# Patient Record
Sex: Female | Born: 1950
Health system: Southern US, Community
[De-identification: ages and names within clinical notes are randomized; demographics above are authoritative.]

## PROBLEM LIST (undated history)

## (undated) ENCOUNTER — Emergency Department (HOSPITAL_COMMUNITY): Discharge status: Absent without leave | Payer: No Typology Code available for payment source

## (undated) DIAGNOSIS — E119 Type 2 diabetes mellitus without complications: Secondary | ICD-10-CM

## (undated) DIAGNOSIS — I10 Essential (primary) hypertension: Secondary | ICD-10-CM

## (undated) DIAGNOSIS — N289 Disorder of kidney and ureter, unspecified: Secondary | ICD-10-CM

## (undated) HISTORY — PX: AV FISTULA PLACEMENT: SHX1204

## (undated) HISTORY — PX: LEG SURGERY: SHX1003

## (undated) HISTORY — PX: HIP FRACTURE SURGERY: SHX118

---

## 2018-01-14 ENCOUNTER — Encounter (HOSPITAL_COMMUNITY): Payer: Self-pay | Admitting: Emergency Medicine

## 2018-01-14 ENCOUNTER — Emergency Department (HOSPITAL_COMMUNITY)
Admission: EM | Admit: 2018-01-14 | Discharge: 2018-01-15 | Disposition: A | Discharge status: Home / Self Care | Payer: No Typology Code available for payment source | Attending: Emergency Medicine | Admitting: Emergency Medicine

## 2018-01-14 ENCOUNTER — Other Ambulatory Visit: Payer: Self-pay

## 2018-01-14 DIAGNOSIS — R519 Headache, unspecified: Secondary | ICD-10-CM

## 2018-01-14 DIAGNOSIS — M546 Pain in thoracic spine: Secondary | ICD-10-CM | POA: Diagnosis not present

## 2018-01-14 DIAGNOSIS — M542 Cervicalgia: Secondary | ICD-10-CM | POA: Diagnosis not present

## 2018-01-14 DIAGNOSIS — I10 Essential (primary) hypertension: Secondary | ICD-10-CM | POA: Insufficient documentation

## 2018-01-14 DIAGNOSIS — Y92410 Unspecified street and highway as the place of occurrence of the external cause: Secondary | ICD-10-CM | POA: Insufficient documentation

## 2018-01-14 DIAGNOSIS — Y999 Unspecified external cause status: Secondary | ICD-10-CM | POA: Diagnosis not present

## 2018-01-14 DIAGNOSIS — E119 Type 2 diabetes mellitus without complications: Secondary | ICD-10-CM | POA: Insufficient documentation

## 2018-01-14 DIAGNOSIS — Y939 Activity, unspecified: Secondary | ICD-10-CM | POA: Diagnosis not present

## 2018-01-14 DIAGNOSIS — S199XXA Unspecified injury of neck, initial encounter: Secondary | ICD-10-CM | POA: Diagnosis present

## 2018-01-14 DIAGNOSIS — R51 Headache: Secondary | ICD-10-CM | POA: Diagnosis not present

## 2018-01-14 HISTORY — DX: Type 2 diabetes mellitus without complications: E11.9

## 2018-01-14 HISTORY — DX: Essential (primary) hypertension: I10

## 2018-01-14 NOTE — ED Notes (Signed)
ED Provider at bedside. 
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
## 2018-01-15 ENCOUNTER — Emergency Department (HOSPITAL_COMMUNITY): Payer: No Typology Code available for payment source

## 2018-01-15 LAB — CBG MONITORING, ED: GLUCOSE-CAPILLARY: 94 mg/dL (ref 70–99)

## 2018-01-15 NOTE — ED Notes (Signed)
Patient transported to CT 
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
## 2018-01-15 NOTE — Discharge Instructions (Addendum)
Use ice and heat on the painful areas for comfort. You can take ibuprofen children's liquid 350 mg (17.5 cc of the 100 mg per 5 cc) and/or 500 mg of acetaminophen (16 cc of the 160 mg per 5 cc) every 6 hours as needed for pain.  Return to the emergency department for any problems listed on the head injury sheet.   Use hielo y calor en las reas dolorosas para mayor comodidad. Puede tomar ibuprofeno lquido para nios 350 mg (17.5 cc de los 100 mg por 5 cc) y / o 500 mg de acetaminofeno (16 cc de los 160 mg por 5 cc) cada 6 horas segn sea necesario para el dolor. Regrese al departamento de emergencias por cualquier problema mencionado en la hoja de lesiones en la cabeza.

## 2018-01-15 NOTE — ED Notes (Signed)
Patient transported to X-ray 

## 2018-01-15 NOTE — ED Notes (Signed)
ED Provider at bedside. 

## 2018-01-15 NOTE — ED Notes (Signed)
Patient verbalizes understanding of discharge instructions. Opportunity for questioning and answers were provided. Armband removed by staff, pt discharged from ED.  

## 2019-03-21 ENCOUNTER — Inpatient Hospital Stay (HOSPITAL_COMMUNITY)
Admission: EM | Admit: 2019-03-21 | Discharge: 2019-03-25 | DRG: 682 | Disposition: A | Discharge status: Home / Self Care | Payer: Self-pay | Attending: Internal Medicine | Admitting: Internal Medicine

## 2019-03-21 ENCOUNTER — Other Ambulatory Visit (HOSPITAL_COMMUNITY): Payer: Self-pay

## 2019-03-21 ENCOUNTER — Emergency Department (HOSPITAL_COMMUNITY): Payer: Self-pay

## 2019-03-21 ENCOUNTER — Encounter (HOSPITAL_COMMUNITY): Payer: Self-pay

## 2019-03-21 ENCOUNTER — Inpatient Hospital Stay (HOSPITAL_COMMUNITY): Payer: Self-pay

## 2019-03-21 ENCOUNTER — Other Ambulatory Visit: Payer: Self-pay

## 2019-03-21 DIAGNOSIS — I10 Essential (primary) hypertension: Secondary | ICD-10-CM

## 2019-03-21 DIAGNOSIS — R011 Cardiac murmur, unspecified: Secondary | ICD-10-CM

## 2019-03-21 DIAGNOSIS — Z794 Long term (current) use of insulin: Secondary | ICD-10-CM

## 2019-03-21 DIAGNOSIS — J9811 Atelectasis: Secondary | ICD-10-CM

## 2019-03-21 DIAGNOSIS — Z9889 Other specified postprocedural states: Secondary | ICD-10-CM

## 2019-03-21 DIAGNOSIS — E871 Hypo-osmolality and hyponatremia: Secondary | ICD-10-CM | POA: Diagnosis present

## 2019-03-21 DIAGNOSIS — I129 Hypertensive chronic kidney disease with stage 1 through stage 4 chronic kidney disease, or unspecified chronic kidney disease: Secondary | ICD-10-CM

## 2019-03-21 DIAGNOSIS — R809 Proteinuria, unspecified: Secondary | ICD-10-CM

## 2019-03-21 DIAGNOSIS — E1129 Type 2 diabetes mellitus with other diabetic kidney complication: Secondary | ICD-10-CM

## 2019-03-21 DIAGNOSIS — R079 Chest pain, unspecified: Secondary | ICD-10-CM | POA: Diagnosis present

## 2019-03-21 DIAGNOSIS — I5033 Acute on chronic diastolic (congestive) heart failure: Secondary | ICD-10-CM | POA: Diagnosis present

## 2019-03-21 DIAGNOSIS — R0602 Shortness of breath: Secondary | ICD-10-CM

## 2019-03-21 DIAGNOSIS — D649 Anemia, unspecified: Secondary | ICD-10-CM

## 2019-03-21 DIAGNOSIS — R0781 Pleurodynia: Secondary | ICD-10-CM

## 2019-03-21 DIAGNOSIS — N281 Cyst of kidney, acquired: Secondary | ICD-10-CM | POA: Diagnosis present

## 2019-03-21 DIAGNOSIS — N2581 Secondary hyperparathyroidism of renal origin: Secondary | ICD-10-CM | POA: Diagnosis present

## 2019-03-21 DIAGNOSIS — M7989 Other specified soft tissue disorders: Secondary | ICD-10-CM

## 2019-03-21 DIAGNOSIS — Z634 Disappearance and death of family member: Secondary | ICD-10-CM

## 2019-03-21 DIAGNOSIS — E876 Hypokalemia: Secondary | ICD-10-CM | POA: Diagnosis present

## 2019-03-21 DIAGNOSIS — D631 Anemia in chronic kidney disease: Secondary | ICD-10-CM | POA: Diagnosis present

## 2019-03-21 DIAGNOSIS — N185 Chronic kidney disease, stage 5: Secondary | ICD-10-CM | POA: Diagnosis present

## 2019-03-21 DIAGNOSIS — N179 Acute kidney failure, unspecified: Principal | ICD-10-CM

## 2019-03-21 DIAGNOSIS — N189 Chronic kidney disease, unspecified: Secondary | ICD-10-CM

## 2019-03-21 DIAGNOSIS — E872 Acidosis: Secondary | ICD-10-CM | POA: Diagnosis present

## 2019-03-21 DIAGNOSIS — J189 Pneumonia, unspecified organism: Secondary | ICD-10-CM

## 2019-03-21 DIAGNOSIS — E875 Hyperkalemia: Secondary | ICD-10-CM

## 2019-03-21 DIAGNOSIS — R633 Feeding difficulties: Secondary | ICD-10-CM

## 2019-03-21 DIAGNOSIS — Z79899 Other long term (current) drug therapy: Secondary | ICD-10-CM

## 2019-03-21 DIAGNOSIS — E1122 Type 2 diabetes mellitus with diabetic chronic kidney disease: Secondary | ICD-10-CM

## 2019-03-21 DIAGNOSIS — Z20828 Contact with and (suspected) exposure to other viral communicable diseases: Secondary | ICD-10-CM | POA: Diagnosis present

## 2019-03-21 DIAGNOSIS — N186 End stage renal disease: Secondary | ICD-10-CM | POA: Diagnosis present

## 2019-03-21 LAB — CBC WITH DIFFERENTIAL/PLATELET
Abs Immature Granulocytes: 0.03 10*3/uL (ref 0.00–0.07)
Basophils Absolute: 0 10*3/uL (ref 0.0–0.1)
Basophils Relative: 0 %
Eosinophils Absolute: 0.1 10*3/uL (ref 0.0–0.5)
Eosinophils Relative: 2 %
HCT: 18.5 % — ABNORMAL LOW (ref 36.0–46.0)
Hemoglobin: 6 g/dL — CL (ref 12.0–15.0)
Immature Granulocytes: 1 %
Lymphocytes Relative: 9 %
Lymphs Abs: 0.6 10*3/uL — ABNORMAL LOW (ref 0.7–4.0)
MCH: 28.2 pg (ref 26.0–34.0)
MCHC: 32.4 g/dL (ref 30.0–36.0)
MCV: 86.9 fL (ref 80.0–100.0)
Monocytes Absolute: 0.4 10*3/uL (ref 0.1–1.0)
Monocytes Relative: 6 %
Neutro Abs: 4.8 10*3/uL (ref 1.7–7.7)
Neutrophils Relative %: 82 %
Platelets: 231 10*3/uL (ref 150–400)
RBC: 2.13 MIL/uL — ABNORMAL LOW (ref 3.87–5.11)
RDW: 13.5 % (ref 11.5–15.5)
WBC: 5.8 10*3/uL (ref 4.0–10.5)
nRBC: 0 % (ref 0.0–0.2)

## 2019-03-21 LAB — I-STAT CHEM 8, ED
BUN: 121 mg/dL — ABNORMAL HIGH (ref 8–23)
Calcium, Ion: 0.84 mmol/L — CL (ref 1.15–1.40)
Chloride: 102 mmol/L (ref 98–111)
Creatinine, Ser: 9.3 mg/dL — ABNORMAL HIGH (ref 0.44–1.00)
Glucose, Bld: 87 mg/dL (ref 70–99)
HCT: 16 % — ABNORMAL LOW (ref 36.0–46.0)
Hemoglobin: 5.4 g/dL — CL (ref 12.0–15.0)
Potassium: 5.4 mmol/L — ABNORMAL HIGH (ref 3.5–5.1)
Sodium: 129 mmol/L — ABNORMAL LOW (ref 135–145)
TCO2: 19 mmol/L — ABNORMAL LOW (ref 22–32)

## 2019-03-21 LAB — URINALYSIS, ROUTINE W REFLEX MICROSCOPIC
Bilirubin Urine: NEGATIVE
Glucose, UA: 150 mg/dL — AB
Hgb urine dipstick: NEGATIVE
Ketones, ur: NEGATIVE mg/dL
Leukocytes,Ua: NEGATIVE
Nitrite: NEGATIVE
Protein, ur: >=300 mg/dL — AB
Specific Gravity, Urine: 1.012 (ref 1.005–1.030)
pH: 7 (ref 5.0–8.0)

## 2019-03-21 LAB — COMPREHENSIVE METABOLIC PANEL
ALT: 13 U/L (ref 0–44)
AST: 16 U/L (ref 15–41)
Albumin: 2.4 g/dL — ABNORMAL LOW (ref 3.5–5.0)
Alkaline Phosphatase: 127 U/L — ABNORMAL HIGH (ref 38–126)
Anion gap: 15 (ref 5–15)
BUN: 109 mg/dL — ABNORMAL HIGH (ref 8–23)
CO2: 15 mmol/L — ABNORMAL LOW (ref 22–32)
Calcium: 6.5 mg/dL — ABNORMAL LOW (ref 8.9–10.3)
Chloride: 100 mmol/L (ref 98–111)
Creatinine, Ser: 8.49 mg/dL — ABNORMAL HIGH (ref 0.44–1.00)
GFR calc Af Amer: 5 mL/min — ABNORMAL LOW (ref >60)
GFR calc non Af Amer: 4 mL/min — ABNORMAL LOW (ref >60)
Glucose, Bld: 133 mg/dL — ABNORMAL HIGH (ref 70–99)
Potassium: 5.3 mmol/L — ABNORMAL HIGH (ref 3.5–5.1)
Sodium: 130 mmol/L — ABNORMAL LOW (ref 135–145)
Total Bilirubin: 0.4 mg/dL (ref 0.3–1.2)
Total Protein: 5.5 g/dL — ABNORMAL LOW (ref 6.5–8.1)

## 2019-03-21 LAB — PROTIME-INR
INR: 1.1 (ref 0.8–1.2)
Prothrombin Time: 13.9 seconds (ref 11.4–15.2)

## 2019-03-21 LAB — CBG MONITORING, ED
Glucose-Capillary: 54 mg/dL — ABNORMAL LOW (ref 70–99)
Glucose-Capillary: 61 mg/dL — ABNORMAL LOW (ref 70–99)

## 2019-03-21 LAB — IRON AND TIBC
Iron: 33 ug/dL (ref 28–170)
Saturation Ratios: 16 % (ref 10.4–31.8)
TIBC: 203 ug/dL — ABNORMAL LOW (ref 250–450)
UIBC: 170 ug/dL

## 2019-03-21 LAB — MAGNESIUM: Magnesium: 2.2 mg/dL (ref 1.7–2.4)

## 2019-03-21 LAB — TROPONIN I (HIGH SENSITIVITY)
Troponin I (High Sensitivity): 11 ng/L (ref <18)
Troponin I (High Sensitivity): 14 ng/L (ref <18)

## 2019-03-21 LAB — RETICULOCYTES
Immature Retic Fract: 9.9 % (ref 2.3–15.9)
RBC.: 2.43 MIL/uL — ABNORMAL LOW (ref 3.87–5.11)
Retic Count, Absolute: 24.1 10*3/uL (ref 19.0–186.0)
Retic Ct Pct: 1 % (ref 0.4–3.1)

## 2019-03-21 LAB — FERRITIN: Ferritin: 106 ng/mL (ref 11–307)

## 2019-03-21 LAB — LIPASE, BLOOD: Lipase: 65 U/L — ABNORMAL HIGH (ref 11–51)

## 2019-03-21 LAB — POC OCCULT BLOOD, ED: Fecal Occult Bld: NEGATIVE

## 2019-03-21 LAB — BRAIN NATRIURETIC PEPTIDE: B Natriuretic Peptide: 453.3 pg/mL — ABNORMAL HIGH (ref 0.0–100.0)

## 2019-03-21 LAB — TSH: TSH: 6.621 u[IU]/mL — ABNORMAL HIGH (ref 0.350–4.500)

## 2019-03-21 LAB — PHOSPHORUS: Phosphorus: 7.4 mg/dL — ABNORMAL HIGH (ref 2.5–4.6)

## 2019-03-21 LAB — PREPARE RBC (CROSSMATCH)

## 2019-03-21 LAB — ABO/RH: ABO/RH(D): O POS

## 2019-03-21 MED ORDER — ACETAMINOPHEN 650 MG RE SUPP: 650.0000 mg | Freq: Four times a day (QID) | RECTAL | Status: DC | PRN

## 2019-03-21 MED ORDER — SODIUM ZIRCONIUM CYCLOSILICATE 5 G PO PACK
5.0000 g | PACK | Freq: Every day | ORAL | Status: AC
  Administered 2019-03-21 – 2019-03-22 (×2): 5 g via ORAL
  Filled 2019-03-21 (×2): qty 1

## 2019-03-21 MED ORDER — CALCIUM CARBONATE 1250 (500 CA) MG PO TABS
1.0000 | ORAL_TABLET | Freq: Three times a day (TID) | ORAL | Status: DC
  Administered 2019-03-21 – 2019-03-23 (×6): 500 mg via ORAL
  Filled 2019-03-21 (×9): qty 1

## 2019-03-21 MED ORDER — ASPIRIN 81 MG PO CHEW
324.0000 mg | CHEWABLE_TABLET | Freq: Once | ORAL | Status: DC
  Filled 2019-03-21: qty 4

## 2019-03-21 MED ORDER — SODIUM CHLORIDE 0.9% FLUSH
3.0000 mL | Freq: Two times a day (BID) | INTRAVENOUS | Status: DC
  Administered 2019-03-22 – 2019-03-24 (×6): 3 mL via INTRAVENOUS
  Administered 2019-03-24: 10 mL via INTRAVENOUS

## 2019-03-21 MED ORDER — ACETAMINOPHEN 325 MG PO TABS
650.0000 mg | ORAL_TABLET | Freq: Four times a day (QID) | ORAL | Status: DC | PRN
  Administered 2019-03-24: 650 mg via ORAL
  Filled 2019-03-21: qty 2

## 2019-03-21 MED ORDER — AMLODIPINE BESYLATE 5 MG PO TABS
5.0000 mg | ORAL_TABLET | Freq: Every day | ORAL | Status: DC
  Administered 2019-03-21 – 2019-03-25 (×5): 5 mg via ORAL
  Filled 2019-03-21 (×5): qty 1

## 2019-03-21 MED ORDER — INSULIN ASPART 100 UNIT/ML ~~LOC~~ SOLN
0.0000 [IU] | Freq: Three times a day (TID) | SUBCUTANEOUS | Status: DC
  Administered 2019-03-23 (×2): 1 [IU] via SUBCUTANEOUS
  Administered 2019-03-24 (×2): 2 [IU] via SUBCUTANEOUS

## 2019-03-21 MED ORDER — SODIUM CHLORIDE 0.9% IV SOLUTION: Freq: Once | INTRAVENOUS | Status: DC

## 2019-03-21 MED ORDER — NITROGLYCERIN 2 % TD OINT
1.0000 [in_us] | TOPICAL_OINTMENT | Freq: Four times a day (QID) | TRANSDERMAL | Status: DC
  Administered 2019-03-21: 1 [in_us] via TOPICAL
  Filled 2019-03-21: qty 1

## 2019-03-21 MED ORDER — CAPTOPRIL 25 MG PO TABS
50.0000 mg | ORAL_TABLET | Freq: Once | ORAL | Status: AC
  Administered 2019-03-21: 50 mg via ORAL
  Filled 2019-03-21 (×2): qty 1

## 2019-03-21 NOTE — Progress Notes (Signed)
Bilateral lower extremity venous duplex completed. Refer to "CV Proc" under chart review to view preliminary results.  03/21/2019 5:53 PM Kelby Aline., MHA, RVT, RDCS, RDMS

## 2019-03-21 NOTE — ED Provider Notes (Addendum)
[facility] EMERGENCY DEPARTMENT Provider Note   CSN: [id number] Arrival date & time: 03/21/19  1551     History   Chief Complaint Chief Complaint  Patient presents with   Chest Pain    HPI Amber Dixon is a 68 y.o. female.     HPI Patient reports a history of diabetes and hypertension.  She has been in the [place] for approximately 1 month.  She reports she has not had her medications since coming to the [place].  She denies any prior history of heart attack or known heart problems.  She started developing chest pain 2 nights ago.  Is been on the left side.  There is been some pressure and sharp quality.  It is made somewhat worse by deep breath and position changes.  She denies any cough or fever.  No known Covid exposure.  Patient reports sometimes she has felt a bit nauseated and has had some decreased appetite.  She has experienced some shortness of breath with exertion.  Patient was taking her blood pressure medication in [place] but does not know the name of it.  She was recently informed that her mother in [place] died and she also thinks that might be contributing to her elevated blood pressure. Past Medical History:  Diagnosis Date   Diabetes mellitus without complication ([place])    Hypertension     There are no active problems to display for this patient.   Past Surgical History:  Procedure Laterality Date   LEG SURGERY       OB History   No obstetric history on file.      Home Medications    Prior to Admission medications   Medication Sig Start Date End Date Taking? Authorizing Provider  captopril (CAPOTEN) 25 MG tablet Take 25 mg by mouth daily.    Yes [provider]  PRESCRIPTION MEDICATION Inject 6 Units into the skin daily. Medication:Insulin Vial   Yes [provider]    Family History History reviewed. No pertinent family history.  Social History Social History   Tobacco Use   Smoking  status: Never Smoker   Smokeless tobacco: Never Used  Substance Use Topics   Alcohol use: Not on file   Drug use: Not on file     Allergies   Patient has no known allergies.   Review of Systems Review of Systems 10 Systems reviewed and are negative for acute change except as noted in the HPI.   Physical Exam Updated Vital Signs BP (!) 155/92    Pulse 87    Temp 98.5 F (36.9 C) (Oral)    Resp (!) 23    SpO2 100%   Physical Exam Constitutional:      Comments: Patient is alert and nontoxic.  No respiratory distress.  Mental status clear.  Eyes:     Extraocular Movements: Extraocular movements intact.  Cardiovascular:     Rate and Rhythm: Normal rate and regular rhythm.  Pulmonary:     Effort: Pulmonary effort is normal.     Breath sounds: Normal breath sounds.  Chest:     Chest wall: Tenderness present.  Abdominal:     General: There is no distension.     Palpations: Abdomen is soft.     Tenderness: There is no abdominal tenderness. There is no guarding.  Genitourinary:    Comments: Stool brown and semiformed.  No melena. Musculoskeletal:     Comments: Patient is 1+ pitting edema at bilateral ankles.  Skin:    General: Skin is warm and dry.  Neurological:     General: No focal deficit present.     Mental Status: She is oriented to person, place, and time.     Coordination: Coordination normal.  Psychiatric:        Mood and Affect: Mood normal.      ED Treatments / Results  Labs (all labs ordered are listed, but only abnormal results are displayed) Labs Reviewed  COMPREHENSIVE METABOLIC PANEL - Abnormal; Notable for the following components:      Result Value   Sodium 130 (*)    Potassium 5.3 (*)    CO2 15 (*)    Glucose, Bld 133 (*)    BUN 109 (*)    Creatinine, Ser 8.49 (*)    Calcium 6.5 (*)    Total Protein 5.5 (*)    Albumin 2.4 (*)    Alkaline Phosphatase 127 (*)    GFR calc non Af Amer 4 (*)    GFR calc Af Amer 5 (*)    All other  components within normal limits  LIPASE, BLOOD - Abnormal; Notable for the following components:   Lipase 65 (*)    All other components within normal limits  BRAIN NATRIURETIC PEPTIDE - Abnormal; Notable for the following components:   B Natriuretic Peptide 453.3 (*)    All other components within normal limits  CBC WITH DIFFERENTIAL/PLATELET - Abnormal; Notable for the following components:   RBC 2.13 (*)    Hemoglobin 6.0 (*)    HCT 18.5 (*)    Lymphs Abs 0.6 (*)    All other components within normal limits  PHOSPHORUS - Abnormal; Notable for the following components:   Phosphorus 7.4 (*)    All other components within normal limits  TSH - Abnormal; Notable for the following components:   TSH 6.621 (*)    All other components within normal limits  I-STAT CHEM 8, ED - Abnormal; Notable for the following components:   Sodium 129 (*)    Potassium 5.4 (*)    BUN 121 (*)    Creatinine, Ser 9.30 (*)    Calcium, Ion 0.84 (*)    TCO2 19 (*)    Hemoglobin 5.4 (*)    HCT 16.0 (*)    All other components within normal limits  SARS CORONAVIRUS 2 (TAT 6-24 HRS)  PROTIME-INR  MAGNESIUM  URINALYSIS, ROUTINE W REFLEX MICROSCOPIC  IRON AND TIBC  FERRITIN  RETICULOCYTES  POC OCCULT BLOOD, ED  TYPE AND SCREEN  ABO/RH  PREPARE RBC (CROSSMATCH)  TROPONIN I (HIGH SENSITIVITY)  TROPONIN I (HIGH SENSITIVITY)    EKG EKG Interpretation  Date/Time:  Friday March 21 2019 16:21:09 EST Ventricular Rate:  92 PR Interval:    QRS Duration: 73 QT Interval:  369 QTC Calculation: 457 R Axis:   51 Text Interpretation: Sinus rhythm normal,  no old comparison Confirmed by Charlesetta Shanks [phone]) on 03/21/2019 4:49:09 PM   Radiology Dg Chest Port 1 View  Result Date: 03/21/2019 CLINICAL DATA:  Chest pain today. EXAM: PORTABLE CHEST 1 VIEW COMPARISON:  PA and lateral chest 05/27/2017. FINDINGS: Patchy airspace disease is seen in the lung bases bilaterally, worse on the left. Heart size is  enlarged. No pneumothorax or pleural effusion. No acute or focal bony abnormality. IMPRESSION: Patchy bibasilar airspace disease is worse on the left and could be due to atelectasis or pneumonia. Cardiomegaly. Atherosclerosis. Electronically Signed   By: Inge Rise M.D.  On: 03/21/2019 16:43   Vas [place] Lower Extremity Venous (dvt)  Result Date: 03/21/2019  Lower Venous Study Indications: Swelling.  Limitations: Body habitus and poor ultrasound/tissue interface. Comparison Study: No prior study Performing Technologist: Maudry Mayhew MHA, RDMS, RVT, RDCS  Examination Guidelines: A complete evaluation includes B-mode imaging, spectral Doppler, color Doppler, and power Doppler as needed of all accessible portions of each vessel. Bilateral testing is considered an integral part of a complete examination. Limited examinations for reoccurring indications may be performed as noted.  +---------+---------------+---------+-----------+----------+--------------+  RIGHT     Compressibility Phasicity Spontaneity Properties Thrombus Aging  +---------+---------------+---------+-----------+----------+--------------+  CFV       Full            No        Yes                                    +---------+---------------+---------+-----------+----------+--------------+  SFJ       Full                                                             +---------+---------------+---------+-----------+----------+--------------+  FV Prox   Full                                                             +---------+---------------+---------+-----------+----------+--------------+  FV Mid    Full                                                             +---------+---------------+---------+-----------+----------+--------------+  FV Distal Full                                                             +---------+---------------+---------+-----------+----------+--------------+  PFV       Full                                                              +---------+---------------+---------+-----------+----------+--------------+  POP       Full            No        Yes                                    +---------+---------------+---------+-----------+----------+--------------+  PTV       Full                                                             +---------+---------------+---------+-----------+----------+--------------+  PERO      Full                                                             +---------+---------------+---------+-----------+----------+--------------+   +---------+---------------+---------+-----------+----------+--------------+  LEFT      Compressibility Phasicity Spontaneity Properties Thrombus Aging  +---------+---------------+---------+-----------+----------+--------------+  CFV       Full            No        Yes                                    +---------+---------------+---------+-----------+----------+--------------+  SFJ       Full                                                             +---------+---------------+---------+-----------+----------+--------------+  FV Prox   Full                                                             +---------+---------------+---------+-----------+----------+--------------+  FV Mid    Full                                                             +---------+---------------+---------+-----------+----------+--------------+  FV Distal Full                                                             +---------+---------------+---------+-----------+----------+--------------+  PFV       Full                                                             +---------+---------------+---------+-----------+----------+--------------+  POP       Full            No        Yes                                    +---------+---------------+---------+-----------+----------+--------------+  PTV       Full                                                              +---------+---------------+---------+-----------+----------+--------------+  PERO      Full                                                             +---------+---------------+---------+-----------+----------+--------------+   Summary: Right: There is no evidence of deep vein thrombosis in the lower extremity. No cystic structure found in the popliteal fossa. Left: There is no evidence of deep vein thrombosis in the lower extremity. No cystic structure found in the popliteal fossa.  Venous flow is pulsatile, suggestive of possible elevated right heart pressure. *See table(s) above for measurements and observations.    Preliminary     Procedures Procedures (including critical care time) CRITICAL CARE Performed by: Charlesetta Shanks   Total critical care time: 30 minutes  Critical care time was exclusive of separately billable procedures and treating other patients.  Critical care was necessary to treat or prevent imminent or life-threatening deterioration.  Critical care was time spent personally by me on the following activities: development of treatment plan with patient and/or surrogate as well as nursing, discussions with consultants, evaluation of patient's response to treatment, examination of patient, obtaining history from patient or surrogate, ordering and performing treatments and interventions, ordering and review of laboratory studies, ordering and review of radiographic studies, pulse oximetry and re-evaluation of patient's condition. Medications Ordered in ED Medications  aspirin chewable tablet 324 mg (0 mg Oral Hold 03/21/19 1900)  0.9 %  sodium chloride infusion (Manually program via Guardrails IV Fluids) (has no administration in time range)  captopril (CAPOTEN) tablet 50 mg (50 mg Oral Given 03/21/19 1826)     Initial Impression / Assessment and Plan / ED Course  I have reviewed the triage vital signs and the nursing notes.  Pertinent labs & imaging results that were  available during my care of the patient were reviewed by me and considered in my medical decision making (see chart for details).  Clinical Course as of Mar 21 1951  Fri Mar 21, 2019  1835 Consult: Reviewed with Bethann [facility]ks[facility] of internal medicine teaching service.  They will plan to admit.   [MP]    Clinical Course User Index [MP] Charlesetta Shanks, MD      Patient's labs have returned with significant diagnostic anomalies.  Patient's clinical appearance is good and based on history and appearance I did not anticipate renal failure and severe anemia.  Captopril had been ordered prior to return of the patient's labs based on her prior use for baseline blood pressure control documented in EMR.  Patient's chest x-ray shows some patchy infiltrate which I suspect is volume overload with finding of pitting edema at her ankles.  Patient has not been febrile or coughing.  Clinically she is nontoxic in appearance and I have lower suspicion for infectious etiology.  Covid testing is pending.  Patient describes untreated hypertension for least a month.  She does not have any prior lab values in the electronic medical record for comparison sake.  It is unclear what her baseline renal function was previously.  We will plan for admission for renal failure with volume overload and severe anemia which may be the underlying cause for the patient's chest pain causing demand ischemia.  Patient's EKG is normal in appearance at this time.  Troponin is normal.  Currently no signs of ACS but patient  certainly has high risk factor for cardiac disease.  Plan for admission.  Amber Dixon was evaluated in Emergency Department on 03/21/2019 for the symptoms described in the history of present illness. She was evaluated in the context of the global COVID-19 pandemic, which necessitated consideration that the patient might be at risk for infection with the SARS-CoV-2 virus that causes COVID-19. Institutional protocols and  algorithms that pertain to the evaluation of patients at risk for COVID-19 are in a state of rapid change based on information released by regulatory bodies including the CDC and federal and state organizations. These policies and algorithms were followed during the patient's care in the ED. Final Clinical Impressions(s) / ED Diagnoses   Final diagnoses:  Acute renal failure, unspecified acute renal failure type ([place])  Symptomatic anemia  Hypertension, unspecified type    ED Discharge Orders    None       Charlesetta Shanks, MD 03/21/19 Ladoris Gene    Charlesetta Shanks, MD 03/21/19 Earney Navy    Charlesetta Shanks, MD 03/21/19 2032

## 2019-03-21 NOTE — ED Notes (Signed)
RN notified about blood glucose and orange juice given

## 2019-03-21 NOTE — ED Notes (Signed)
CBG 61. Pt given orange juice, will recheck

## 2019-03-21 NOTE — H&P (Addendum)
Date: 03/21/2019               Patient Name:  Amber Dixon MRN: [id number]  DOB: 04/29/1950 Age / Sex: 68 y.o., female   PCP: Patient, No Pcp Per         Medical Service: Internal Medicine Teaching Service         Attending Physician: Dr. Evette Doffing, Mallie Mussel, *    First Contact: Dr. Court Joy  Pager: [phone]  Second Contact: Dr. Koleen Distance Pager: [phone]       After Hours (After 5p/  First Contact Pager: [phone]  weekends / holidays): Second Contact Pager: [phone]   Chief Complaint: Chest pain  History of Present Illness: Patient is from [place] and has been in the [place] for one month. Pt has been seen in [facility] ED in [place] 2019 for a MCV but was discharged from the ED. No other previous records are available.   Amber Dixon is a 68 year old [place] speaking female with a PMHx significant for HTN and T2DM who presented to the ED with a 2 day history of chest pain. The patient states that she was in her normal state of health when her mother died 2 days ago.  Her chest pain started soon after. The pain is located on her L side, and is sharp in nature. It is worsened by deep inspirations and position changes. She has some associated shortness of breath with exertion as well. She states that her chest pain feels better when she takes her captopril. The pain does not radiate to her jaw or down her arm. Patient states that she had some mild nausea this morning while eating breakfast. Otherwise, the patient has not had any headaches, vision changes, cough, abdominal pain, dysuria, melena, diarrhea, or numbness or tingling in the extremities.   Of note, the patient states that she is the last time she was seen a physician was 10 years ago. She periodically goes to a clinic in [place] where they check her blood sugar and blood pressure.  In the ED, a CBC, CMP, BNP, troponin, TSH, and PT/INR were ordered. Hemoglobin was severely low at 6.0. CMP was notable for mild hyponatremia to 130,  elevated potassium to 5.3, elevated BUN and creatinine of 109 and 8.49 respectively as well as a GFR of 4. Low calcium to 6.5. Chest x-ray was notable for patchy bibasilar airspace disease, worsened on the left compared to right which could represent atelectasis or pneumonia.  In the ED, the patient did receive aspirin 324 mg as well as catopril 50 mg.    Meds:  Current Meds  Medication Sig   captopril (CAPOTEN) 25 MG tablet Take 25 mg by mouth daily.    PRESCRIPTION MEDICATION Inject 6 Units into the skin daily. Medication:Insulin Vial   Allergies: Allergies as of 03/21/2019   (No Known Allergies)   Past Medical History:  Diagnosis Date   Diabetes mellitus without complication ([place])    Hypertension    Family History: No significant family history reported  Social History:   Patient lives in [place]  Visiting her son in [place]. Has been here for a month. Currently staying with her son, daughter-in-law, and 3 grandchildren at home. Denies anyone sick at home.  Denies drug, tobacco or alcohol use  Review of Systems: A complete ROS was negative except as per HPI.   Imaging: EKG:  Sinus rhythm  CXR:  IMPRESSION: 1.Patchy bibasilar airspace disease is worse on the left and  could be due to atelectasis or pneumonia. 2. Cardiomegaly. 3. Atherosclerosis.  Physical Exam: Blood pressure (!) 187/79, pulse 81, temperature 99.1 F (37.3 C), temperature source Oral, resp. rate 16, SpO2 100 %.  Physical Exam Vitals signs reviewed.  Constitutional:      General: She is not in acute distress.    Appearance: Normal appearance. She is ill-appearing. She is not toxic-appearing.     Comments: Petite and thin appearing  HENT:     Head: Normocephalic and atraumatic.  Eyes:     General: No scleral icterus.       Right eye: No discharge.        Left eye: No discharge.     Extraocular Movements: Extraocular movements intact.  Cardiovascular:     Rate and Rhythm: Normal rate and  regular rhythm.     Pulses: Normal pulses.     Heart sounds: Murmur present. No friction rub. Gallop (S4) present.   Pulmonary:     Effort: Pulmonary effort is normal. No respiratory distress.     Breath sounds: No wheezing or rales.     Comments: Crackles at the bases bilaterally Chest:     Chest wall: Tenderness (L parasternaral region next to the breast) present.  Abdominal:     General: Abdomen is flat. Bowel sounds are normal. There is no distension.     Palpations: Abdomen is soft.     Tenderness: There is no abdominal tenderness. There is no right CVA tenderness, left CVA tenderness or guarding.  Musculoskeletal:        General: No swelling or deformity.     Right lower leg: No edema.     Left lower leg: No edema.  Skin:    General: Skin is warm.     Findings: No bruising, lesion or rash.  Neurological:     General: No focal deficit present.     Mental Status: She is alert.  Psychiatric:        Mood and Affect: Mood normal.        Behavior: Behavior normal.    Assessment & Plan by Problem: Active Problems:   Acute renal failure (ARF) ([place])  In summary, Amber Dixon is a 68 year old Spanish speaking female the past medical history significant for HTN and T2DM who presents with a 2-day history of pointed, reproducible chest pain in addition to symptomatic anemia and kidney failure. It is unclear at this time if her renal failure is acute or chronic. However, given the fact the patient has not seen a physician in over 10 years raises my suspicion that this is a chronic issue.  #Renal Failure  #Hypocalcemia  #Hyperkalemia #Hyperphosphatemia Patient's current GFR is 4. We do not have past medical records, however I suspect this is likely chronic in nature, given how well compensated the patient appears at the moment.  Patient's calcium (6.5) was low, K+ was slightly elevated (5.3) and phosphorous was elevated (7.4).   PTH and vitamin D 25 hydroxy ordered.  Dose of calcium  carbonate 500 mg ordered.  Renal ultrasound ordered  Daily BMP's  Strict I's and O's  Daily weights  #Symptomatic anemia: Likely secondary to chronic renal disease.  Patient does not report any bleeding in her urine or bowel movements or melena.  Shortness of breath on exertion as well as a hemoglobin of 6.0 is consistent with with symptomatic anemia.  Stat type and screen  2 U pRBC ordered  Reticulocytes, ferritin, iron and TIBC ordered  Daily CBCs  #  Murmur (S4 heart sound) #HTN: Patient states she takes captopril [place]. She has not had her medication since she has been in the [place] for the last month. Received Catapril 50 mg once in the ED. I suspect her hypertension is not well controlled and likely has left ventricular hypertrophy.  She may have some component of heart failure given findings on CXR. BP on admission was 187/89.  D/c Catapril in the setting of renal disease  Start amlodipine 5 mg daily.  I suspect she will require additional BP medication while she is here.  Will uptitrate as needed.  Echocardiogram ordered  Cardiac monitoring  #Chest pain #Multifocal pneumonia: Pointed reproducible chest pain is consistent with skill skeletal etiology.  I suspect costochondritis.  Chest x-ray showed multifocal opacities, however the patient is afebrile and has no respiratory symptoms at this time.  No reported sick contacts at home. On exam, she did have bilateral lower lobe crackles. Perhaps this is a result of untreated hypertension resulting in left heart failure.  Hold off on antibiotics at this time.  Will reconsider if patient becomes febrile  Echocardiogram ordered  2 view CXR ordered  Tylenol 650 mg every 6 hours as needed  #T2DM: Patient states she takes intermediate acting insulin 6U in the morning before breakfast typically.  Hgb A1c ordered  SSI  #FEN/GI  Diet: Renal, carb modified  Fluids: None  #DVT prophylaxis  SCDs  #CODE STATUS: FULL  #Dispo: Admit  patient to Inpatient with expected length of stay greater than 2 midnights.  Signed: Earlene Plater, MD Internal Medicine, PGY1 Pager: [phone]  03/21/2019,7:31 PM

## 2019-03-21 NOTE — ED Triage Notes (Signed)
Pt arrives to ED with c/o chest pain. Pt is spanish speaking only.

## 2019-03-21 NOTE — ED Notes (Signed)
Pt bladder scanned 346 mL

## 2019-03-22 ENCOUNTER — Inpatient Hospital Stay (HOSPITAL_COMMUNITY): Payer: Self-pay

## 2019-03-22 DIAGNOSIS — R809 Proteinuria, unspecified: Secondary | ICD-10-CM | POA: Diagnosis present

## 2019-03-22 DIAGNOSIS — I10 Essential (primary) hypertension: Secondary | ICD-10-CM | POA: Diagnosis present

## 2019-03-22 DIAGNOSIS — R079 Chest pain, unspecified: Secondary | ICD-10-CM | POA: Diagnosis present

## 2019-03-22 DIAGNOSIS — R0600 Dyspnea, unspecified: Secondary | ICD-10-CM

## 2019-03-22 DIAGNOSIS — D631 Anemia in chronic kidney disease: Secondary | ICD-10-CM

## 2019-03-22 DIAGNOSIS — Z9114 Patient's other noncompliance with medication regimen: Secondary | ICD-10-CM

## 2019-03-22 LAB — COMPREHENSIVE METABOLIC PANEL
ALT: 10 U/L (ref 0–44)
AST: 15 U/L (ref 15–41)
Albumin: 2.3 g/dL — ABNORMAL LOW (ref 3.5–5.0)
Alkaline Phosphatase: 119 U/L (ref 38–126)
Anion gap: 15 (ref 5–15)
BUN: 104 mg/dL — ABNORMAL HIGH (ref 8–23)
CO2: 17 mmol/L — ABNORMAL LOW (ref 22–32)
Calcium: 6.7 mg/dL — ABNORMAL LOW (ref 8.9–10.3)
Chloride: 101 mmol/L (ref 98–111)
Creatinine, Ser: 8.02 mg/dL — ABNORMAL HIGH (ref 0.44–1.00)
GFR calc Af Amer: 5 mL/min — ABNORMAL LOW (ref >60)
GFR calc non Af Amer: 5 mL/min — ABNORMAL LOW (ref >60)
Glucose, Bld: 70 mg/dL (ref 70–99)
Potassium: 4.7 mmol/L (ref 3.5–5.1)
Sodium: 133 mmol/L — ABNORMAL LOW (ref 135–145)
Total Bilirubin: 0.5 mg/dL (ref 0.3–1.2)
Total Protein: 5.3 g/dL — ABNORMAL LOW (ref 6.5–8.1)

## 2019-03-22 LAB — SARS CORONAVIRUS 2 (TAT 6-24 HRS): SARS Coronavirus 2: NEGATIVE

## 2019-03-22 LAB — HEMOGLOBIN A1C
Hgb A1c MFr Bld: 5.9 % — ABNORMAL HIGH (ref 4.8–5.6)
Mean Plasma Glucose: 122.63 mg/dL

## 2019-03-22 LAB — CBC
HCT: 32.6 % — ABNORMAL LOW (ref 36.0–46.0)
Hemoglobin: 10.8 g/dL — ABNORMAL LOW (ref 12.0–15.0)
MCH: 28 pg (ref 26.0–34.0)
MCHC: 33.1 g/dL (ref 30.0–36.0)
MCV: 84.5 fL (ref 80.0–100.0)
Platelets: 209 10*3/uL (ref 150–400)
RBC: 3.86 MIL/uL — ABNORMAL LOW (ref 3.87–5.11)
RDW: 14.2 % (ref 11.5–15.5)
WBC: 6.4 10*3/uL (ref 4.0–10.5)
nRBC: 0 % (ref 0.0–0.2)

## 2019-03-22 LAB — ECHOCARDIOGRAM COMPLETE

## 2019-03-22 LAB — T4, FREE: Free T4: 0.87 ng/dL (ref 0.61–1.12)

## 2019-03-22 LAB — VITAMIN D 25 HYDROXY (VIT D DEFICIENCY, FRACTURES): Vit D, 25-Hydroxy: 10.34 ng/mL — ABNORMAL LOW (ref 30–100)

## 2019-03-22 LAB — CBG MONITORING, ED
Glucose-Capillary: 125 mg/dL — ABNORMAL HIGH (ref 70–99)
Glucose-Capillary: 123 mg/dL — ABNORMAL HIGH (ref 70–99)
Glucose-Capillary: 69 mg/dL — ABNORMAL LOW (ref 70–99)

## 2019-03-22 LAB — PROTEIN / CREATININE RATIO, URINE
Creatinine, Urine: 38.62 mg/dL
Protein Creatinine Ratio: 20.74 mg/mg{Cre} — ABNORMAL HIGH (ref 0.00–0.15)
Total Protein, Urine: 801 mg/dL

## 2019-03-22 LAB — GLUCOSE, CAPILLARY
Glucose-Capillary: 120 mg/dL — ABNORMAL HIGH (ref 70–99)
Glucose-Capillary: 155 mg/dL — ABNORMAL HIGH (ref 70–99)

## 2019-03-22 LAB — HIV ANTIBODY (ROUTINE TESTING W REFLEX): HIV Screen 4th Generation wRfx: NONREACTIVE

## 2019-03-22 MED ORDER — DICLOFENAC SODIUM 1 % EX GEL
2.0000 g | Freq: Four times a day (QID) | CUTANEOUS | Status: DC
  Administered 2019-03-22 (×2): 2 g via TOPICAL
  Filled 2019-03-22: qty 100

## 2019-03-22 MED ORDER — FUROSEMIDE 10 MG/ML IJ SOLN
60.0000 mg | Freq: Two times a day (BID) | INTRAMUSCULAR | Status: DC
  Administered 2019-03-22 – 2019-03-23 (×2): 60 mg via INTRAVENOUS
  Filled 2019-03-22 (×2): qty 6

## 2019-03-22 NOTE — ED Notes (Signed)
Report called  

## 2019-03-22 NOTE — Progress Notes (Signed)
  Echocardiogram 2D Echocardiogram has been performed.  Darlina Sicilian M 03/22/2019, 11:48 AM

## 2019-03-22 NOTE — ED Notes (Signed)
Patient CBG was 123.

## 2019-03-22 NOTE — ED Notes (Signed)
Pt given turkey sandwich and juice.  

## 2019-03-22 NOTE — ED Notes (Signed)
Lunch Tray Ordered @ 1029. 

## 2019-03-22 NOTE — Plan of Care (Signed)
  Problem: Nutrition: Goal: Adequate nutrition will be maintained Outcome: Progressing   

## 2019-03-22 NOTE — Progress Notes (Addendum)
Subjective: [name] continues to have left sided chest pain associated with deep inspiration. Otherwise she feels well.  She reports her shortness of breath has improved.  She denies abdominal pain.  She reports she is staying in the [place] until the end of March.  We discussed the plan to consult our nephrologists and to obtain imaging of her heart.  She was amenable to that plan.  Consults: Nephrology  Objective:  Vital signs in last 24 hours: Vitals:   03/22/19 0545 03/22/19 0600 03/22/19 1100 03/22/19 1134  BP: (!) 150/73 (!) 153/72 (!) 158/78 (!) 160/78  Pulse: 81 81 79 83  Resp:   13 17  Temp:      TempSrc:      SpO2: 99% 99% 99% 99%   Physical Exam  Constitutional: No distress.  Small, elderly-appearing lady  HENT:  Head: Normocephalic and atraumatic.  Cardiovascular: Normal rate, regular rhythm, normal heart sounds and intact distal pulses.  No murmur heard. Pulmonary/Chest: Effort normal. No respiratory distress. She exhibits tenderness (Left-sided chest wall tenderness most acutely under the left breast).  Diffuse crackles  Abdominal: Soft. Bowel sounds are normal. She exhibits no distension. There is no abdominal tenderness.  Musculoskeletal: Normal range of motion.  Neurological: She is alert.  Skin: Skin is warm and dry. She is not diaphoretic. No erythema.  Multiple flat hyperpigmented circular lesions on her right abdomen and flank  Psychiatric: Affect normal.  Nursing note and vitals reviewed.  I/Os:  Intake/Output Summary (Last 24 hours) at 03/22/2019 1239 Last data filed at 03/21/2019 2253 Gross per 24 hour  Intake 315 ml  Output   Net 315 ml   Labs: Results for orders placed or performed during the hospital encounter of 03/21/19 (from the past 24 hour(s))  Comprehensive metabolic panel     Status: Abnormal   Collection Time: 03/21/19  4:17 PM  Result Value Ref Range   Sodium 130 (L) 135 - 145 mmol/L   Potassium 5.3 (H) 3.5 - 5.1  mmol/L   Chloride 100 98 - 111 mmol/L   CO2 15 (L) 22 - 32 mmol/L   Glucose, Bld 133 (H) 70 - 99 mg/dL   BUN 109 (H) 8 - 23 mg/dL   Creatinine, Ser 8.49 (H) 0.44 - 1.00 mg/dL   Calcium 6.5 (L) 8.9 - 10.3 mg/dL   Total Protein 5.5 (L) 6.5 - 8.1 g/dL   Albumin 2.4 (L) 3.5 - 5.0 g/dL   AST 16 15 - 41 U/L   ALT 13 0 - 44 U/L   Alkaline Phosphatase 127 (H) 38 - 126 U/L   Total Bilirubin 0.4 0.3 - 1.2 mg/dL   GFR calc non Af Amer 4 (L) >60 mL/min   GFR calc Af Amer 5 (L) >60 mL/min   Anion gap 15 5 - 15  Lipase, blood     Status: Abnormal   Collection Time: 03/21/19  4:17 PM  Result Value Ref Range   Lipase 65 (H) 11 - 51 U/L  Brain natriuretic peptide     Status: Abnormal   Collection Time: 03/21/19  4:17 PM  Result Value Ref Range   B Natriuretic Peptide 453.3 (H) 0.0 - 100.0 pg/mL  Troponin I (High Sensitivity)     Status: None   Collection Time: 03/21/19  4:17 PM  Result Value Ref Range   Troponin I (High Sensitivity) 11 <18 ng/L  CBC with Differential     Status: Abnormal   Collection Time: 03/21/19  4:17 PM  Result Value Ref Range   WBC 5.8 4.0 - 10.5 K/uL   RBC 2.13 (L) 3.87 - 5.11 MIL/uL   Hemoglobin 6.0 (LL) 12.0 - 15.0 g/dL   HCT 18.5 (L) 36.0 - 46.0 %   MCV 86.9 80.0 - 100.0 fL   MCH 28.2 26.0 - 34.0 pg   MCHC 32.4 30.0 - 36.0 g/dL   RDW 13.5 11.5 - 15.5 %   Platelets 231 150 - 400 K/uL   nRBC 0.0 0.0 - 0.2 %   Neutrophils Relative % 82 %   Neutro Abs 4.8 1.7 - 7.7 K/uL   Lymphocytes Relative 9 %   Lymphs Abs 0.6 (L) 0.7 - 4.0 K/uL   Monocytes Relative 6 %   Monocytes Absolute 0.4 0.1 - 1.0 K/uL   Eosinophils Relative 2 %   Eosinophils Absolute 0.1 0.0 - 0.5 K/uL   Basophils Relative 0 %   Basophils Absolute 0.0 0.0 - 0.1 K/uL   Immature Granulocytes 1 %   Abs Immature Granulocytes 0.03 0.00 - 0.07 K/uL  Protime-INR     Status: None   Collection Time: 03/21/19  4:17 PM  Result Value Ref Range   Prothrombin Time 13.9 11.4 - 15.2 seconds   INR 1.1 0.8 - 1.2   Type and screen [place]     Status: None (Preliminary result)   Collection Time: 03/21/19  5:37 PM  Result Value Ref Range   ABO/RH(D) O POS    Antibody Screen NEG    Sample Expiration 03/24/2019,2359    Unit Number [id number]    Blood Component Type RED CELLS,LR    Unit division 00    Status of Unit ISSUED,FINAL    Transfusion Status OK TO TRANSFUSE    Crossmatch Result      Compatible Performed at [facility], [address], [place], [place] [address]    Unit Number [id number]    Blood Component Type RED CELLS,LR    Unit division 00    Status of Unit ISSUED    Transfusion Status OK TO TRANSFUSE    Crossmatch Result Compatible   ABO/Rh     Status: None   Collection Time: 03/21/19  5:37 PM  Result Value Ref Range   ABO/RH(D)      O POS Performed at [facility] [address], [place], [place] [address]   I-stat chem 8, ED (not at [facility] or [facility])     Status: Abnormal   Collection Time: 03/21/19  5:52 PM  Result Value Ref Range   Sodium 129 (L) 135 - 145 mmol/L   Potassium 5.4 (H) 3.5 - 5.1 mmol/L   Chloride 102 98 - 111 mmol/L   BUN 121 (H) 8 - 23 mg/dL   Creatinine, Ser 9.30 (H) 0.44 - 1.00 mg/dL   Glucose, Bld 87 70 - 99 mg/dL   Calcium, Ion 0.84 (LL) 1.15 - 1.40 mmol/L   TCO2 19 (L) 22 - 32 mmol/L   Hemoglobin 5.4 (LL) 12.0 - 15.0 g/dL   HCT 16.0 (L) 36.0 - 46.0 %   Comment NOTIFIED PHYSICIAN   SARS CORONAVIRUS 2 (TAT 6-24 HRS) Nasopharyngeal Nasopharyngeal Swab     Status: None   Collection Time: 03/21/19  6:11 PM   Specimen: Nasopharyngeal Swab  Result Value Ref Range   SARS Coronavirus 2 NEGATIVE NEGATIVE  TSH     Status: Abnormal   Collection Time: 03/21/19  6:16 PM  Result Value Ref  Range   TSH 6.621 (H) 0.350 - 4.500 uIU/mL  Troponin I (High Sensitivity)     Status: None   Collection Time: 03/21/19  6:17 PM  Result Value Ref Range   Troponin I (High Sensitivity) 14 <18 ng/L  Magnesium     Status: None    Collection Time: 03/21/19  6:17 PM  Result Value Ref Range   Magnesium 2.2 1.7 - 2.4 mg/dL  Phosphorus     Status: Abnormal   Collection Time: 03/21/19  6:17 PM  Result Value Ref Range   Phosphorus 7.4 (H) 2.5 - 4.6 mg/dL  Prepare RBC     Status: None   Collection Time: 03/21/19  7:10 PM  Result Value Ref Range   Order Confirmation      ORDER PROCESSED BY BLOOD BANK Performed at [facility], [place] [address], [place], [place] [address]   POC occult blood, ED Provider will collect     Status: None   Collection Time: 03/21/19  7:30 PM  Result Value Ref Range   Fecal Occult Bld NEGATIVE NEGATIVE  Iron and TIBC     Status: Abnormal   Collection Time: 03/21/19  9:41 PM  Result Value Ref Range   Iron 33 28 - 170 ug/dL   TIBC 203 (L) 250 - 450 ug/dL   Saturation Ratios 16 10.4 - 31.8 %   UIBC 170 ug/dL  Ferritin     Status: None   Collection Time: 03/21/19  9:41 PM  Result Value Ref Range   Ferritin 106 11 - 307 ng/mL  Reticulocytes     Status: Abnormal   Collection Time: 03/21/19  9:41 PM  Result Value Ref Range   Retic Ct Pct 1.0 0.4 - 3.1 %   RBC. 2.43 (L) 3.87 - 5.11 MIL/uL   Retic Count, Absolute 24.1 19.0 - 186.0 K/uL   Immature Retic Fract 9.9 2.3 - 15.9 %  CBG monitoring, ED     Status: Abnormal   Collection Time: 03/21/19 10:18 PM  Result Value Ref Range   Glucose-Capillary 54 (L) 70 - 99 mg/dL   Comment 1 Notify RN    Comment 2 Document in Chart   CBG monitoring, ED     Status: Abnormal   Collection Time: 03/21/19 10:20 PM  Result Value Ref Range   Glucose-Capillary 61 (L) 70 - 99 mg/dL   Comment 1 Notify RN    Comment 2 Document in Chart   Urinalysis, Routine w reflex microscopic     Status: Abnormal   Collection Time: 03/21/19 10:22 PM  Result Value Ref Range   Color, Urine STRAW (A) YELLOW   APPearance CLEAR CLEAR   Specific Gravity, Urine 1.012 1.005 - 1.030   pH 7.0 5.0 - 8.0   Glucose, UA 150 (A) NEGATIVE mg/dL   Hgb urine dipstick NEGATIVE  NEGATIVE   Bilirubin Urine NEGATIVE NEGATIVE   Ketones, ur NEGATIVE NEGATIVE mg/dL   Protein, ur >=300 (A) NEGATIVE mg/dL   Nitrite NEGATIVE NEGATIVE   Leukocytes,Ua NEGATIVE NEGATIVE   RBC / HPF 0-5 0 - 5 RBC/hpf   WBC, UA 0-5 0 - 5 WBC/hpf   Bacteria, UA RARE (A) NONE SEEN   Squamous Epithelial / LPF 0-5 0 - 5  CBG monitoring, ED     Status: Abnormal   Collection Time: 03/22/19  1:23 AM  Result Value Ref Range   Glucose-Capillary 125 (H) 70 - 99 mg/dL  VITAMIN D 25 Hydroxy (Vit-D Deficiency, Fractures)  Status: Abnormal   Collection Time: 03/22/19  3:00 AM  Result Value Ref Range   Vit D, 25-Hydroxy 10.34 (L) 30 - 100 ng/mL  Comprehensive metabolic panel     Status: Abnormal   Collection Time: 03/22/19  5:00 AM  Result Value Ref Range   Sodium 133 (L) 135 - 145 mmol/L   Potassium 4.7 3.5 - 5.1 mmol/L   Chloride 101 98 - 111 mmol/L   CO2 17 (L) 22 - 32 mmol/L   Glucose, Bld 70 70 - 99 mg/dL   BUN 104 (H) 8 - 23 mg/dL   Creatinine, Ser 8.02 (H) 0.44 - 1.00 mg/dL   Calcium 6.7 (L) 8.9 - 10.3 mg/dL   Total Protein 5.3 (L) 6.5 - 8.1 g/dL   Albumin 2.3 (L) 3.5 - 5.0 g/dL   AST 15 15 - 41 U/L   ALT 10 0 - 44 U/L   Alkaline Phosphatase 119 38 - 126 U/L   Total Bilirubin 0.5 0.3 - 1.2 mg/dL   GFR calc non Af Amer 5 (L) >60 mL/min   GFR calc Af Amer 5 (L) >60 mL/min   Anion gap 15 5 - 15  CBC     Status: Abnormal   Collection Time: 03/22/19  5:00 AM  Result Value Ref Range   WBC 6.4 4.0 - 10.5 K/uL   RBC 3.86 (L) 3.87 - 5.11 MIL/uL   Hemoglobin 10.8 (L) 12.0 - 15.0 g/dL   HCT 32.6 (L) 36.0 - 46.0 %   MCV 84.5 80.0 - 100.0 fL   MCH 28.0 26.0 - 34.0 pg   MCHC 33.1 30.0 - 36.0 g/dL   RDW 14.2 11.5 - 15.5 %   Platelets 209 150 - 400 K/uL   nRBC 0.0 0.0 - 0.2 %  HIV Antibody (routine testing w rflx)     Status: None   Collection Time: 03/22/19  6:08 AM  Result Value Ref Range   HIV Screen 4th Generation wRfx NON REACTIVE NON REACTIVE  Hemoglobin A1c     Status: Abnormal    Collection Time: 03/22/19  6:08 AM  Result Value Ref Range   Hgb A1c MFr Bld 5.9 (H) 4.8 - 5.6 %   Mean Plasma Glucose 122.63 mg/dL  CBG monitoring, ED     Status: Abnormal   Collection Time: 03/22/19 11:36 AM  Result Value Ref Range   Glucose-Capillary 69 (L) 70 - 99 mg/dL  CBG monitoring, ED     Status: Abnormal   Collection Time: 03/22/19 12:20 PM  Result Value Ref Range   Glucose-Capillary 123 (H) 70 - 99 mg/dL   Comment 1 Notify RN    Comment 2 Document in Chart    Imaging:  US RENAL  IMPRESSION: Increased echotexture within the kidneys bilaterally compatible with chronic medical renal disease.   1.6 cm complex appearing cyst in the upper pole of the left kidney. This could be followed with repeat ultrasound in 6 months or further characterized with nonemergent contrast-enhanced CT or MRI.  Assessment/Plan:  Assessment: [name] is a 68 year old Spanish-speaking female with a past medical history of hypertension and type 2 diabetes without comprehensive medical care who presents with a 2-day history of left-sided chest pain and SOB found to have a Hgb of 6.0, a Creatinine of 8.49 and a BUN of 109 whose picture appears to be consistent with complications from an acute vs. chronic renal failure.  Plan: Principal Problem:   Renal failure (HCC)/Proteinuria/Hypokalemia/Hyperphosphatemia  -pt presented to hospital with  chest pain and shortness of breath found to be in renal failure with a GFR of 4, a Creatinine of almost 9, elevated K, elevated Phos and some possible uremic sx including decreased appetite and metallic taste to her food  -CT of the kidneys demonstrate findings consistent with chronic kidney disease -patient reports no hx of kidney disease but only goes to the doctor for blood pressure and blood sugar checks; she was on an Ace Inhibitor prior to admission -Creatinine stable at 8 with volume repletion -given inconsistent healthcare follow-up, this is likely a  worsening of her chronic renal failure; given how well-appearing and well compensated the patient is, this is unlikely an acute renal failure; also, CT with findings consistent with chronic renal failure -Patient has proteinuria on UA, ordered U P/C ratio -Have consulted nephrology for assistance with this work-up  Plan: -Avoid nephrotoxic agents -Discontinue use of ACE inhibitor  -Follow-up nephrology recs  Active Problems:    Chest pain/SOB: -Patient presents with left-sided chest pain most significantly under her left breast that is both pleuritic and tender to palpation; she also has associated shortness of breath without fever or other respiratory symptoms and without sick contacts -EKG without ischemic changes and unremarkable troponins -Chest x-ray showing multifocal opacities -On exam the patient has diffuse crackles -Today patient reports continued chest pain and improved shortness of breath -Bedside ultrasound with what appears to be normal EF -ACS unlikely and pneumonia unlikely  -The concern is for acute heart failure exacerbation versus pericarditis from her possible uremia; given normal-appearing bedside ultrasound, we are leaning towards a pericarditis associated with her uremia  Plan: -No indication for antibiotics at this time  -Echo today to evaluate for heart failure versus pericarditis -Tylenol ordered for pain     Anemia of renal disease -pt presented to hospital with chest pain and shortness of breath found to have a hemoglobin of 6.0 which was confirmed on repeat collection -This was presumed to be associated with her presumed chronic renal disease -Patient denied any bleeding in her urine or stool and FOBT negative -Reticulocytes inappropriately low suggesting a production issue -patient s/p 2 units of PRBCs with repeat Hgb of 10.8   Plan:  -monitor Hgb with CBC -anemia treatment per nephro     Hypertension -Patient with elevated BP on admission around  190/80 in setting of not taking her blood pressure medicine around a month -Was on captopril prior to admission -Given her kidney function will not continue ACE inhibitor and started amlodipine 5 mg -bp still elevated today at 160/78 but amlodipine can take a few days to work  Plan: -continue amlodipine 5 mg  Dispo: Anticipated discharge pending clinical course.  Al Decant, MD 03/22/2019, 12:39 PM Pager: [phone]

## 2019-03-22 NOTE — ED Notes (Signed)
SDU  Breakfast ordered  

## 2019-03-22 NOTE — Consult Note (Signed)
Renal Service Consult Note [facility]  Koren Bound 03/22/2019 Sol Blazing Requesting Physician:  Dr Evette Doffing  Reason for Consult:  Renal failure HPI: The patient is a 68 y.o. year-old with hx of DM and HTN presented to ED yest pm for chest pain. Has been in [place] about 1 month.  Also some nausea. Was not taking any medications since arriving in [place], was taking BP medication in [place]. Here her creat is 8 and we are asked to see for renal failure.   Pt and son were spoken to w/ interpreter. They deny prior hx of kidney problems. They "never were told about anything about it". Insulin and captopril are only home meds listed.   She does c/o poor appetite and some fatigue, no nausea or vomiting no CP or leg swelling.        Hb was 5.4 on admission and up to 10.8 today after prbc x 2.  She rec'd IV lasix 40 bid and has 450 cc UOP recorded from yesterday.  Getting norvasc 5 here and rec'd one dose of captopril (home med) but is now dc'd.    ROS  denies CP  no joint pain   no HA  no blurry vision  no rash  no diarrhea  no nausea/ vomiting  no dysuria  no difficulty voiding  no change in urine color    Past Medical History  Past Medical History:  Diagnosis Date  . Diabetes mellitus without complication ([place])   . Hypertension    Past Surgical History  Past Surgical History:  Procedure Laterality Date  . LEG SURGERY     Family History History reviewed. No pertinent family history. Social History  reports that she has never smoked. She has never used smokeless tobacco. No history on file for alcohol and drug. Allergies No Known Allergies Home medications Prior to Admission medications   Medication Sig Start Date End Date Taking? Authorizing Provider  captopril (CAPOTEN) 25 MG tablet Take 25 mg by mouth daily.    Yes [provider]  PRESCRIPTION MEDICATION Inject 6 Units into the skin daily. Medication:Insulin Vial   Yes [provider]    Liver Function Tests Recent Labs  Lab 03/21/19 1617 03/22/19 0500  AST 16 15  ALT 13 10  ALKPHOS 127* 119  BILITOT 0.4 0.5  PROT 5.5* 5.3*  ALBUMIN 2.4* 2.3*   Recent Labs  Lab 03/21/19 1617  LIPASE 65*   CBC Recent Labs  Lab 03/21/19 1617 03/21/19 1752 03/22/19 0500  WBC 5.8  --  6.4  NEUTROABS 4.8  --   --   HGB 6.0* 5.4* 10.8*  HCT 18.5* 16.0* 32.6*  MCV 86.9  --  84.5  PLT 231  --  732   Basic Metabolic Panel Recent Labs  Lab 03/21/19 1617 03/21/19 1752 03/21/19 1817 03/22/19 0500  NA 130* 129*  --  133*  K 5.3* 5.4*  --  4.7  CL 100 102  --  101  CO2 15*  --   --  17*  GLUCOSE 133* 87  --  70  BUN 109* 121*  --  104*  CREATININE 8.49* 9.30*  --  8.02*  CALCIUM 6.5*  --   --  6.7*  PHOS  --   --  7.4*  --    Iron/TIBC/Ferritin/ %Sat    Component Value Date/Time   IRON 33 03/21/2019 2141   TIBC 203 (L) 03/21/2019 2141   FERRITIN 106 03/21/2019 2141  IRONPCTSAT 16 03/21/2019 2141    Vitals:   03/22/19 1230 03/22/19 1300 03/22/19 1325 03/22/19 1632  BP: (!) 160/79  (!) 158/71 132/88  Pulse: 84  87 82  Resp: _0 Temp:   98.3 F (36.8 C) 98.7 F (37.1 C)  TempSrc:   Oral Oral  SpO2: 99%  98% 97%  Weight:  36 kg      Exam Gen alert pleasant elderly female, no distress No rash, cyanosis or gangrene Sclera anicteric, throat clear  +JVD  Chest fine and coarse rales R base 1/2 up and L base 1/3 up RRR no MRG Abd soft ntnd no mass or ascites +bs GU defer MS no joint effusions or deformity Ext no LE edema, no wounds or ulcers Neuro is alert, Ox 3 , nf    Home meds:  - captopril 25 qd  - insulin 6u qd   Renal US 11/27 > 7.9 and 9.0 cm kidneys w/ ^'d echo and no hydro  UA >300 protein, 0-5 rbc/ wbc   UPC ratio = 20.7 mg prot/ mg creat (801prot / 38 creat)   HIV neg, COVID eng    Hb 6.0 > 10.8 after prbc's, FOB neg, wbc 6k  plt 209    eGFR 5 ml/min , alb 2.3    CXR #1- Patchy airspace disease is seen in the lung bases  bilaterally, worse on the left. Heart size is enlarged.    CXR #2 > Multifocal areas of interstitial and airspace opacity throughout the lungs suspicious for multifocal pneumonia or edema.   Assessment/ Plan: 1. Renal failure - suspect this is CKD V, end-stage disease. Pt has mild uremic symptoms of fatigue and anorexia.  Appears to have CHF/ pulm edema by exam and CXR but not symptomatic really.  Has severe proteinuria w/ upc ratio of 20. +hx DM/ HTN was on acei and insulin per home med list.  Pt in [place] only one month, was only going to stay a few months on VISA.  Visiting son is on VISA also but has been here longer. Indicators of chronicity of renal failure are severe anemia w/ neg FOB, small kidney's on [place], vol overload (lack of dehydration) and high BP's.  For now will try to diurese w/ high doses of IV lasix.  Holding acei.  DC nsaid cream. Will send a few serologies and myeloma studies. We talked about possible need for dialysis. Neither had many questions, they seemed to know a little about dialysis. Not urgent indication but not stable to dc either.  2. Anemia - likely CKD related 3. Vol overload - as above      Kelly Splinter  MD 03/22/2019, 6:45 PM

## 2019-03-23 DIAGNOSIS — R079 Chest pain, unspecified: Secondary | ICD-10-CM

## 2019-03-23 LAB — BPAM RBC
Blood Product Expiration Date: 202012282359
Blood Product Expiration Date: 202012282359
ISSUE DATE / TIME: 202011271935
ISSUE DATE / TIME: 202011280138
Unit Type and Rh: 5100
Unit Type and Rh: 5100

## 2019-03-23 LAB — RENAL FUNCTION PANEL
Albumin: 2.3 g/dL — ABNORMAL LOW (ref 3.5–5.0)
Anion gap: 14 (ref 5–15)
BUN: 108 mg/dL — ABNORMAL HIGH (ref 8–23)
CO2: 17 mmol/L — ABNORMAL LOW (ref 22–32)
Calcium: 6.8 mg/dL — ABNORMAL LOW (ref 8.9–10.3)
Chloride: 104 mmol/L (ref 98–111)
Creatinine, Ser: 8.57 mg/dL — ABNORMAL HIGH (ref 0.44–1.00)
GFR calc Af Amer: 5 mL/min — ABNORMAL LOW (ref >60)
GFR calc non Af Amer: 4 mL/min — ABNORMAL LOW (ref >60)
Glucose, Bld: 95 mg/dL (ref 70–99)
Phosphorus: 7.9 mg/dL — ABNORMAL HIGH (ref 2.5–4.6)
Potassium: 4.4 mmol/L (ref 3.5–5.1)
Sodium: 135 mmol/L (ref 135–145)

## 2019-03-23 LAB — TYPE AND SCREEN
ABO/RH(D): O POS
Antibody Screen: NEGATIVE
Unit division: 0
Unit division: 0

## 2019-03-23 LAB — IRON AND TIBC
Iron: 56 ug/dL (ref 28–170)
Saturation Ratios: 28 % (ref 10.4–31.8)
TIBC: 199 ug/dL — ABNORMAL LOW (ref 250–450)
UIBC: 143 ug/dL

## 2019-03-23 LAB — HEPATITIS B CORE ANTIBODY, IGM: Hep B C IgM: NONREACTIVE

## 2019-03-23 LAB — HEPATITIS B SURFACE ANTIBODY,QUALITATIVE: Hep B S Ab: REACTIVE — AB

## 2019-03-23 LAB — CREATININE, URINE, RANDOM: Creatinine, Urine: 46.28 mg/dL

## 2019-03-23 LAB — GLUCOSE, CAPILLARY
Glucose-Capillary: 101 mg/dL — ABNORMAL HIGH (ref 70–99)
Glucose-Capillary: 128 mg/dL — ABNORMAL HIGH (ref 70–99)
Glucose-Capillary: 135 mg/dL — ABNORMAL HIGH (ref 70–99)
Glucose-Capillary: 78 mg/dL (ref 70–99)

## 2019-03-23 LAB — SODIUM, URINE, RANDOM: Sodium, Ur: 57 mmol/L

## 2019-03-23 LAB — HEPATITIS B SURFACE ANTIGEN: Hepatitis B Surface Ag: NONREACTIVE

## 2019-03-23 MED ORDER — FUROSEMIDE 10 MG/ML IJ SOLN
80.0000 mg | Freq: Two times a day (BID) | INTRAMUSCULAR | Status: DC
  Administered 2019-03-23 – 2019-03-24 (×2): 80 mg via INTRAVENOUS
  Filled 2019-03-23 (×2): qty 8

## 2019-03-23 NOTE — Progress Notes (Signed)
[facility] Progress Note  Subjective: per primary team notes, pt doesn't want dialysis at this time since she does not plan on being in this country much longer and feels back to her baseline.    Vitals:   03/22/19 2140 03/23/19 0347 03/23/19 0408 03/23/19 0948  BP: (!) 151/68 (!) 160/75  (!) 159/82  Pulse: 86 88  83  Resp: 16 20  16   Temp: 99.2 F (37.3 C) 98.7 F (37.1 C)  98.7 F (37.1 C)  TempSrc:  Oral  Oral  SpO2: 96% 95%  95%  Weight:  36.4 kg 36.4 kg     Inpatient medications: . sodium chloride   Intravenous Once  . amLODipine  5 mg Oral Daily  . aspirin  324 mg Oral Once  . calcium carbonate  1 tablet Oral TID WC  . furosemide  60 mg Intravenous BID  . insulin aspart  0-9 Units Subcutaneous TID WC  . sodium chloride flush  3 mL Intravenous Q12H    acetaminophen **OR** acetaminophen    Exam: Gen alert pleasant elderly female, calm Chest clear bilat RRR no MRG Abd soft ntnd no mass or ascites +bs Ext no LE edema Neuro is alert, Ox 3 , nf    Home meds:  - captopril 25 qd  - insulin 6u qd   Renal US 11/27 > 7.9 and 9.0 cm kidneys w/ ^'d echo and no hydro  UA >300 protein, 0-5 rbc/ wbc   UPC ratio = 20.7 mg prot/ mg creat (801prot / 38 creat)   HIV neg, COVID eng    Hb 6.0 > 10.8 after prbc's, FOB neg, wbc 6k  plt 209    eGFR 5 ml/min , alb 2.3    CXR #1- Patchy airspace disease is seen in the lung bases bilaterally, worse on the left. Heart size is enlarged.    CXR #2 > Multifocal areas of interstitial and airspace opacity throughout the lungs suspicious for multifocal pneumonia or edema.   Assessment/ Plan: 1. Renal failure - creat 8 here, no old labs, pt visiting from [place] on visa for 4-6 mos. Suspect this is all chronic kidney disease, stage V. Severe proteinuria w/ upc ratio of 20. +hx DM/ HTN was on acei and insulin per home med list. Renal US shows 7.9 and 9.0 cm echogenic kidneys. No urgent indication for RRT.   Per primary  team notes, pt doesn't want dialysis at this time since she does not plan on being in this country much longer and feels back to her baseline. We are trying to diurese her a bit for CXR changes of IS edema although pt denied SOB.  Also sent a few serologies. May be able to dc on po lasix in 24- 48 hrs and f/u w/ [facility] until leaves for [place].  2. Anemia - likely CKD related 3. Vol overload - not severe, cont IV lasix for now      Rob Lareina Espino 03/23/2019, 2:58 PM  Iron/TIBC/Ferritin/ %Sat    Component Value Date/Time   IRON 56 03/23/2019 0531   TIBC 199 (L) 03/23/2019 0531   FERRITIN 106 03/21/2019 2141   IRONPCTSAT 28 03/23/2019 0531   Recent Labs  Lab 03/21/19 1617  03/23/19 0531  NA 130*   < > 135  K 5.3*   < > 4.4  CL 100   < > 104  CO2 15*   < > 17*  GLUCOSE 133*   < > 95  BUN 109*   < >  108*  CREATININE 8.49*   < > 8.57*  CALCIUM 6.5*   < > 6.8*  PHOS  --    < > 7.9*  ALBUMIN 2.4*   < > 2.3*  INR 1.1  --   --    < > = values in this interval not displayed.   Recent Labs  Lab 03/22/19 0500  AST 15  ALT 10  ALKPHOS 119  BILITOT 0.5  PROT 5.3*   Recent Labs  Lab 03/22/19 0500  WBC 6.4  HGB 10.8*  HCT 32.6*  PLT 209

## 2019-03-23 NOTE — Progress Notes (Signed)
   Subjective: [name] was accompanied at bedside by her son. Evaluation was assisted by [place] interpreter.  She reports feeling much improved this morning with resolution in her chest pain and shortness of breath. She and her son met with nephrologist yesterday and had further family discussion regarding dialysis. Currently, their wishes are to not pursue dialysis at this time since she does not plan on being in the country much longer and feels back to her baseline. All questions and concerns were addressed.   Objective:  Vital signs in last 24 hours: Vitals:   03/22/19 1632 03/22/19 2140 03/23/19 0347 03/23/19 0408  BP: 132/88 (!) 151/68 (!) 160/75   Pulse: 82 86 88   Resp: '18 16 20   '$ Temp: 98.7 F (37.1 C) 99.2 F (37.3 C) 98.7 F (37.1 C)   TempSrc: Oral  Oral   SpO2: 97% 96% 95%   Weight:   36.4 kg 36.4 kg   General: awake, alert, pleasant female, sitting up in bed in NAD CV: RRR; no m/r/g Pulm: normal work of breathing; crackles at bilateral bases  Abd: soft, non-tender, non-distended  Ext: no edema   Assessment/Plan:  Principal Problem:   Acute on chronic renal failure (HCC) Active Problems:   Hypertension   Anemia of renal disease   Proteinuria   Chest pain  [name] is a 68 year old Spanish-speaking female with a past medical history of hypertension and type 2 diabetes without comprehensive medical care who presents with a 2-day history of left-sided chest pain and SOB found to have a Hgb of 6.0, a Creatinine of 8.49 and a BUN of 109 in the setting of acute on chronic renal failure.   Acute on chronic renal failure  Proteinuria - hyperkalemia improved with initiation of IV Lasix  - etiology of renal failure most likely longstanding HTN; nephrology working up other causes with serologies and myeloma studies.  - there remains no urgent indication for dialysis. Appreciate nephrology consult. At this time, family does not wish to pursue dialysis. Spent  time discussing signs to look out for that would indicate she needs to be brought in more urgently. Will need to clarify if dialysis is an option for her when she returns to [place].  - will follow-up nephro recs, but she may be able to be discharged on a PO diuretic regimen and could follow-up with [facility] or [facility] in the interim before returning to [place]  Chest pain - initial concern for stress induced CM given recent death in the family, but echo revealed normal EF - ischemic work-up on presentation also negative - chest pain completely resolved today, which indicates it may have been related to her anemia   Anemia of renal disease - received 2 units pRBC on admission with rise in hgb from 6>10 in the setting of very low body weight and small stature - will continue to monitor and transfuse prn - will defer ESA to nephro   HTN - holding her ACE in the setting of above and started on Amlodipine - she was also mildly volume overloaded and currently responding to IV Lasix which should also help blood pressure  - will titrate regimen as needed   Dispo: Anticipated discharge in approximately 0-1 day(s).   Modena Nunnery D, DO 03/23/2019, 7:03 AM Pager: [phone]

## 2019-03-23 NOTE — Progress Notes (Signed)
Patient' birthday is [birth year] she has a [place] ID. Will pass on this information to RN so that they will be able to correct this on Monday.

## 2019-03-24 DIAGNOSIS — N2581 Secondary hyperparathyroidism of renal origin: Secondary | ICD-10-CM

## 2019-03-24 DIAGNOSIS — I5033 Acute on chronic diastolic (congestive) heart failure: Secondary | ICD-10-CM | POA: Diagnosis present

## 2019-03-24 LAB — RENAL FUNCTION PANEL
Albumin: 2.1 g/dL — ABNORMAL LOW (ref 3.5–5.0)
Anion gap: 17 — ABNORMAL HIGH (ref 5–15)
BUN: 107 mg/dL — ABNORMAL HIGH (ref 8–23)
CO2: 17 mmol/L — ABNORMAL LOW (ref 22–32)
Calcium: 6.9 mg/dL — ABNORMAL LOW (ref 8.9–10.3)
Chloride: 99 mmol/L (ref 98–111)
Creatinine, Ser: 9.04 mg/dL — ABNORMAL HIGH (ref 0.44–1.00)
GFR calc Af Amer: 5 mL/min — ABNORMAL LOW (ref >60)
GFR calc non Af Amer: 4 mL/min — ABNORMAL LOW (ref >60)
Glucose, Bld: 84 mg/dL (ref 70–99)
Phosphorus: 7.4 mg/dL — ABNORMAL HIGH (ref 2.5–4.6)
Potassium: 4.3 mmol/L (ref 3.5–5.1)
Sodium: 133 mmol/L — ABNORMAL LOW (ref 135–145)

## 2019-03-24 LAB — CBC
HCT: 31.9 % — ABNORMAL LOW (ref 36.0–46.0)
Hemoglobin: 10.9 g/dL — ABNORMAL LOW (ref 12.0–15.0)
MCH: 28.4 pg (ref 26.0–34.0)
MCHC: 34.2 g/dL (ref 30.0–36.0)
MCV: 83.1 fL (ref 80.0–100.0)
Platelets: 236 10*3/uL (ref 150–400)
RBC: 3.84 MIL/uL — ABNORMAL LOW (ref 3.87–5.11)
RDW: 14.1 % (ref 11.5–15.5)
WBC: 8.1 10*3/uL (ref 4.0–10.5)
nRBC: 0 % (ref 0.0–0.2)

## 2019-03-24 LAB — KAPPA/LAMBDA LIGHT CHAINS
Kappa free light chain: 134.3 mg/L — ABNORMAL HIGH (ref 3.3–19.4)
Kappa, lambda light chain ratio: 1.39 (ref 0.26–1.65)
Lambda free light chains: 96.5 mg/L — ABNORMAL HIGH (ref 5.7–26.3)

## 2019-03-24 LAB — GLUCOSE, CAPILLARY
Glucose-Capillary: 103 mg/dL — ABNORMAL HIGH (ref 70–99)
Glucose-Capillary: 156 mg/dL — ABNORMAL HIGH (ref 70–99)
Glucose-Capillary: 171 mg/dL — ABNORMAL HIGH (ref 70–99)
Glucose-Capillary: 94 mg/dL (ref 70–99)

## 2019-03-24 LAB — C3 COMPLEMENT: C3 Complement: 113 mg/dL (ref 82–167)

## 2019-03-24 LAB — HEPATITIS C ANTIBODY (REFLEX): HCV Ab: <0.1 s/co ratio (ref 0.0–0.9)

## 2019-03-24 LAB — C4 COMPLEMENT: Complement C4, Body Fluid: 28 mg/dL (ref 12–38)

## 2019-03-24 LAB — PTH, INTACT AND CALCIUM
Calcium, Total (PTH): 6.3 mg/dL (ref 8.7–10.3)
PTH: 269 pg/mL — ABNORMAL HIGH (ref 15–65)

## 2019-03-24 LAB — HCV COMMENT:

## 2019-03-24 MED ORDER — SODIUM BICARBONATE 650 MG PO TABS
650.0000 mg | ORAL_TABLET | Freq: Two times a day (BID) | ORAL | Status: DC
  Administered 2019-03-24 – 2019-03-25 (×3): 650 mg via ORAL
  Filled 2019-03-24 (×3): qty 1

## 2019-03-24 MED ORDER — CALCIUM CARBONATE 1250 (500 CA) MG PO TABS
2.0000 | ORAL_TABLET | Freq: Three times a day (TID) | ORAL | Status: DC
  Administered 2019-03-24 – 2019-03-25 (×2): 1000 mg via ORAL
  Filled 2019-03-24 (×2): qty 1

## 2019-03-24 MED ORDER — FUROSEMIDE 80 MG PO TABS
80.0000 mg | ORAL_TABLET | Freq: Two times a day (BID) | ORAL | Status: DC
  Administered 2019-03-24 – 2019-03-25 (×2): 80 mg via ORAL
  Filled 2019-03-24 (×2): qty 1

## 2019-03-24 NOTE — Progress Notes (Signed)
Subjective: [name] reports resolution of her chest pain and shortness of breath.  She does not wish to pursue dialysis at this time and would like to go home on diuretics. She asked for a letter to allow for her sons to come visit from [place] and we told her that is not something we were able to do.   Consults: Nephrology  Objective:  Vital signs in last 24 hours: Vitals:   03/23/19 1627 03/23/19 2110 03/24/19 0519 03/24/19 0849  BP: (!) 151/73 (!) 167/75 (!) 161/83 (!) 163/77  Pulse: 86 88 87 84  Resp: 18 16 16 18   Temp: 98.4 F (36.9 C) 98.7 F (37.1 C) 98.9 F (37.2 C) 98.8 F (37.1 C)  TempSrc: Oral Oral Oral Oral  SpO2: 96% 97% 96% 95%  Weight:   36.1 kg    Physical Exam  Constitutional: No distress.  Small, elderly-appearing lady  HENT:  Head: Normocephalic and atraumatic.  Cardiovascular: Normal rate, regular rhythm, normal heart sounds and intact distal pulses.  No murmur heard. Pulmonary/Chest: Effort normal. No respiratory distress. She exhibits no tenderness.  Basilar crackles  Abdominal: Soft. Bowel sounds are normal. She exhibits no distension. There is no abdominal tenderness.  Musculoskeletal: Normal range of motion.  Neurological: She is alert.  Skin: Skin is warm and dry. She is not diaphoretic. No erythema.  Multiple flat hyperpigmented circular lesions on her right abdomen and flank  Psychiatric: Affect normal.  Nursing note and vitals reviewed.  I/Os:  Intake/Output Summary (Last 24 hours) at 03/24/2019 1210 Last data filed at 03/24/2019 0900 Gross per 24 hour  Intake 980 ml  Output 1150 ml  Net -170 ml   Labs: Results for orders placed or performed during the hospital encounter of 03/21/19 (from the past 24 hour(s))  Glucose, capillary     Status: Abnormal   Collection Time: 03/23/19  4:26 PM  Result Value Ref Range   Glucose-Capillary 135 (H) 70 - 99 mg/dL  Glucose, capillary     Status: None   Collection Time: 03/23/19  9:11 PM   Result Value Ref Range   Glucose-Capillary 78 70 - 99 mg/dL  Renal function panel     Status: Abnormal   Collection Time: 03/24/19  4:36 AM  Result Value Ref Range   Sodium 133 (L) 135 - 145 mmol/L   Potassium 4.3 3.5 - 5.1 mmol/L   Chloride 99 98 - 111 mmol/L   CO2 17 (L) 22 - 32 mmol/L   Glucose, Bld 84 70 - 99 mg/dL   BUN 107 (H) 8 - 23 mg/dL   Creatinine, Ser 9.04 (H) 0.44 - 1.00 mg/dL   Calcium 6.9 (L) 8.9 - 10.3 mg/dL   Phosphorus 7.4 (H) 2.5 - 4.6 mg/dL   Albumin 2.1 (L) 3.5 - 5.0 g/dL   GFR calc non Af Amer 4 (L) >60 mL/min   GFR calc Af Amer 5 (L) >60 mL/min   Anion gap 17 (H) 5 - 15  CBC     Status: Abnormal   Collection Time: 03/24/19  4:36 AM  Result Value Ref Range   WBC 8.1 4.0 - 10.5 K/uL   RBC 3.84 (L) 3.87 - 5.11 MIL/uL   Hemoglobin 10.9 (L) 12.0 - 15.0 g/dL   HCT 31.9 (L) 36.0 - 46.0 %   MCV 83.1 80.0 - 100.0 fL   MCH 28.4 26.0 - 34.0 pg   MCHC 34.2 30.0 - 36.0 g/dL   RDW 14.1 11.5 - 15.5 %  Platelets 236 150 - 400 K/uL   nRBC 0.0 0.0 - 0.2 %  Glucose, capillary     Status: Abnormal   Collection Time: 03/24/19  6:51 AM  Result Value Ref Range   Glucose-Capillary 103 (H) 70 - 99 mg/dL  Glucose, capillary     Status: Abnormal   Collection Time: 03/24/19 11:51 AM  Result Value Ref Range   Glucose-Capillary 156 (H) 70 - 99 mg/dL    Assessment/Plan:  Assessment: [name] is a 68 year old Spanish-speaking female with a past medical history of hypertension and type 2 diabetes without comprehensive medical care who presents with a 2-day history of left-sided chest pain and SOB found to have a Hgb of 5.4, a Creatinine of 8.49 and a BUN of 109 whose picture appears to be consistent with complications from chronic renal failure.  Plan: Principal Problem:   Renal failure (HCC)/Proteinuria/Hypokalemia/Hyperphosphatemia  -pt presented to hospital with chest pain and shortness of breath found to be in renal failure with a GFR of 4, a Creatinine of almost 9,  elevated K, elevated Phos and some possible uremic sx including decreased appetite and metallic taste to her food  -CT of the kidneys demonstrate findings consistent with chronic kidney disease -patient reports no hx of kidney disease but only goes to the doctor for blood pressure and blood sugar checks; she was on an Ace Inhibitor prior to admission -given inconsistent healthcare follow-up, this is thought to be a worsening of her chronic renal failure; given how well-appearing and well compensated the patient is, this is unlikely an acute renal failure; also, CT with findings consistent with chronic renal failure -Nephrology consulted who believes this to be chronic kidney disease, stage V without urgent indication for RRT and given pt not amenable to dialysis at this time she may be a candidate for dc on po lasix today or tomorrow w/ follow up until she leaves to return home to [place] -this is a difficult social situation and patient could become sick again requiring hospitalization; may not be recommended that patient remain in the [place] until the end of March as she planned as issues with visas and insurance can become tricky -patient wishes to discuss with her family when she gets home  Plan: -continue 33 IV lasix BID -consulted social work for disposition assistance   Active Problems:    Chest pain/SOB: -Patient presents with left-sided chest pain most significantly under her left breast that was both pleuritic and tender to palpation; she also had associated shortness of breath without fever or other respiratory symptoms and without sick contacts who had a negative ACS work up whose sx were thought to be secondary to a pericarditis from uremia and SOB from increased fluid in her lungs -treated with 80 mg lasix IV BID per nephrology\ -no CP or SOB today; decreased crackles on exam  Plan: -continue diuresis per nephrology     Anemia of renal disease -pt presented to hospital with chest pain  and shortness of breath found to have a hemoglobin of 6.0 which was confirmed on repeat collection -This was presumed to be associated with her presumed chronic renal disease -Patient denied any bleeding in her urine or stool and FOBT negative -Reticulocytes inappropriately low suggesting a production issue -patient s/p 2 units of PRBCs with repeat Hgb of 10.8   Plan:  -monitor Hgb with CBC -anemia treatment per nephro; recommend ESA in the future     Hypertension -Patient with elevated BP on admission around 190/80 in  setting of not taking her blood pressure medicine around a month -Was on captopril prior to admission -Given her kidney function will not continue ACE inhibitor and started amlodipine 5 mg -bp still elevated and treated with diuresis as above -nephro plans to increase amlodipine if BP doesn't decrease with above regimen  Plan: -continue amlodipine 5 mg and diuresis as above    DM2 -SSI    Secondary hyperparathyroidism -PTH 269, [place], Phos high -started on Tums tid per nephro   Dispo: Anticipated discharge today or tomorrow.  Al Decant, MD 03/24/2019, 12:10 PM Pager: [phone]

## 2019-03-24 NOTE — TOC Initial Note (Signed)
Transition of Care [facility]) - Initial/Assessment Note    Patient Details  Name: Koren Bound MRN: [id number] Date of Birth: 1950/08/17  Transition of Care [facility]) CM/SW Contact:    Bartholomew Crews, RN Phone Number: [phone] 03/24/2019, 11:57 AM  Clinical Narrative:                 Acknowledging consult for diuretics at discharge. Noted on MAR that patient is currently on furosemide IV - furosemide is on [facility] $4 list. TOC following for transition needs.   Expected Discharge Plan: Home/Self Care Barriers to Discharge: Continued Medical Work up   Patient Goals and CMS Choice        Expected Discharge Plan and Services Expected Discharge Plan: Home/Self Care                                              Prior Living Arrangements/Services                       Activities of Daily Living      Permission Sought/Granted                  Emotional Assessment              Admission diagnosis:  Atelectasis [J98.11] Acute renal failure (ARF) ([place]) [N17.9] Chest pain [R07.9] Symptomatic anemia [D64.9] Acute renal failure, unspecified acute renal failure type ([place]) [N17.9] Hypertension, unspecified type [I10] Patient Active Problem List   Diagnosis Date Noted  . Hypertension 03/22/2019  . Anemia of renal disease 03/22/2019  . Proteinuria 03/22/2019  . Chest pain 03/22/2019  . Acute on chronic renal failure ([place]) 03/21/2019   PCP:  Patient, No Pcp Per Pharmacy:   [facility] [place], [address] [place] [address] Phone: [phone] Fax: [phone]     Social Determinants of Health (SDOH) Interventions    Readmission Risk Interventions No flowsheet data found.

## 2019-03-24 NOTE — Progress Notes (Addendum)
[facility] Progress Note   Subjective:  Seen in room with family member - used Optometrist. She denies CP, dyspnea, N/V. + weakness and occ LE cramping.  Objective Vitals:   03/23/19 1627 03/23/19 2110 03/24/19 0519 03/24/19 0849  BP: (!) 151/73 (!) 167/75 (!) 161/83 (!) 163/77  Pulse: 86 88 87 84  Resp: 18 16 16 18   Temp: 98.4 F (36.9 C) 98.7 F (37.1 C) 98.9 F (37.2 C) 98.8 F (37.1 C)  TempSrc: Oral Oral Oral Oral  SpO2: 96% 97% 96% 95%  Weight:   36.1 kg    Physical Exam General: Frail woman, NAD Heart: RRR; no murmur Lungs: CTAB Abdomen: soft, non-tender Extremities: No LE edema Dialysis Access: none  Additional Objective Labs: Basic Metabolic Panel: Recent Labs  Lab 03/21/19 1817  03/22/19 0500 03/23/19 0531 03/24/19 0436  NA  --   --  133* 135 133*  K  --   --  4.7 4.4 4.3  CL  --   --  101 104 99  CO2  --   --  17* 17* 17*  GLUCOSE  --   --  70 95 84  BUN  --   --  104* 108* 107*  CREATININE  --   --  8.02* 8.57* 9.04*  CALCIUM  --    < > 6.7* 6.8* 6.9*  PHOS 7.4*  --   --  7.9* 7.4*   < > = values in this interval not displayed.   Liver Function Tests: Recent Labs  Lab 03/21/19 1617 03/22/19 0500 03/23/19 0531 03/24/19 0436  AST 16 15  --   --   ALT 13 10  --   --   ALKPHOS 127* 119  --   --   BILITOT 0.4 0.5  --   --   PROT 5.5* 5.3*  --   --   ALBUMIN 2.4* 2.3* 2.3* 2.1*   Recent Labs  Lab 03/21/19 1617  LIPASE 65*   CBC: Recent Labs  Lab 03/21/19 1617 03/21/19 1752 03/22/19 0500 03/24/19 0436  WBC 5.8  --  6.4 8.1  NEUTROABS 4.8  --   --   --   HGB 6.0* 5.4* 10.8* 10.9*  HCT 18.5* 16.0* 32.6* 31.9*  MCV 86.9  --  84.5 83.1  PLT 231  --  209 236   CBG: Recent Labs  Lab 03/23/19 0653 03/23/19 1134 03/23/19 1626 03/23/19 2111 03/24/19 0651  GLUCAP 101* 128* 135* 78 103*   Iron Studies:  Recent Labs    03/21/19 2141 03/23/19 0531  IRON 33 56  TIBC 203* 199*  FERRITIN 106  --     Medications:  . sodium chloride   Intravenous Once  . amLODipine  5 mg Oral Daily  . aspirin  324 mg Oral Once  . calcium carbonate  1 tablet Oral TID WC  . furosemide  80 mg Intravenous BID  . insulin aspart  0-9 Units Subcutaneous TID WC  . sodium chloride flush  3 mL Intravenous Q12H     Assessment/Plan: 1. Stage V CKD: Long standing DM/HTN. Severe proteinuria (20g) and shrunken kidneys with ^ echogenicity on [place]. Doubt any reversibility - she is very close to needing dialysis, GFR 5. ACEi held on admit. No uremic symptoms except fatigue. No hyperkalemia, does have metabolic acidosis.  ** Discussed current situation with family. Her desire is to stay in [place] until March 2021 and then return to [place] as planned. I am very concerned  that her kidneys won't make it that long - would suspect that she will need to start HD within the next month. I am very worried that she will crash and HD will need to be started emergently. She is not a citizen of [place] - here on travel visa - which becomes an issue in itself for dialysis (would ONLY qualify for emergency medicaid). Her best bet would be to travel back to [place] immediately and f/u with her physician there to start dialysis. Urged her to call her family (here and [place]) and try to make arrangements. If she wants to continue to stay in [place] as planned, then we can plan to see her in our office although this is a large risk and again may lead to dire consequences. Will circle back after she discusses with family.  ** For now, await pending results (ANA, complements, Hep C, SPEP, FLC). Will start Na bicarb 650mg  BID.  3. HTN/volume: BP high - CXR 11/27 with pulm edema. Given lasix 60mg  11/28-11/29, now ^ to 80mg  BID. No LE edema or dyspnea. If BP doesn't come down, will ^ amlodipine to 10mg . 4. Anemia (CKD): Hgb extremely low on admit - s/p 2U PRBCs 11/27. Will need ESA in near future - await plan. 5. Secondary hyperparathyroidism:  PTH 269 - Ca  low/Phos high. Was started on Tums TID. Monitor. 6. Type 2 DM: Continue SSI.  Veneta Penton, PA-C 03/24/2019, 11:43 AM  [facility] Pager: [phone]  I have seen and examined this patient and agree with plan and assessment in the above note with renal recommendations/intervention highlighted.  I spoke with she and her brother via translator and discussed the chronicity and severity of her CKD.  They do not want to pursue HD at this time, however I do not feel she will last without HD until March when she wants to go back to [place].  Will continue to follow closely and initiate HD if she develops uremic symptoms.  Governor [place]ooks Nevin Kozuch,MD 03/24/2019 5:45 PM

## 2019-03-25 LAB — RENAL FUNCTION PANEL
Albumin: 2.1 g/dL — ABNORMAL LOW (ref 3.5–5.0)
Anion gap: 17 — ABNORMAL HIGH (ref 5–15)
BUN: 108 mg/dL — ABNORMAL HIGH (ref 8–23)
CO2: 17 mmol/L — ABNORMAL LOW (ref 22–32)
Calcium: 6.8 mg/dL — ABNORMAL LOW (ref 8.9–10.3)
Chloride: 98 mmol/L (ref 98–111)
Creatinine, Ser: 9.19 mg/dL — ABNORMAL HIGH (ref 0.44–1.00)
GFR calc Af Amer: 5 mL/min — ABNORMAL LOW (ref >60)
GFR calc non Af Amer: 4 mL/min — ABNORMAL LOW (ref >60)
Glucose, Bld: 78 mg/dL (ref 70–99)
Phosphorus: 7.2 mg/dL — ABNORMAL HIGH (ref 2.5–4.6)
Potassium: 4.6 mmol/L (ref 3.5–5.1)
Sodium: 132 mmol/L — ABNORMAL LOW (ref 135–145)

## 2019-03-25 LAB — PROTEIN ELECTROPHORESIS, SERUM
A/G Ratio: 0.9 (ref 0.7–1.7)
Albumin ELP: 2.4 g/dL — ABNORMAL LOW (ref 2.9–4.4)
Alpha-1-Globulin: 0.2 g/dL (ref 0.0–0.4)
Alpha-2-Globulin: 0.9 g/dL (ref 0.4–1.0)
Beta Globulin: 0.9 g/dL (ref 0.7–1.3)
Gamma Globulin: 0.5 g/dL (ref 0.4–1.8)
Globulin, Total: 2.6 g/dL (ref 2.2–3.9)
Total Protein ELP: 5 g/dL — ABNORMAL LOW (ref 6.0–8.5)

## 2019-03-25 LAB — CBC
HCT: 32.7 % — ABNORMAL LOW (ref 36.0–46.0)
Hemoglobin: 10.7 g/dL — ABNORMAL LOW (ref 12.0–15.0)
MCH: 27.9 pg (ref 26.0–34.0)
MCHC: 32.7 g/dL (ref 30.0–36.0)
MCV: 85.2 fL (ref 80.0–100.0)
Platelets: 236 10*3/uL (ref 150–400)
RBC: 3.84 MIL/uL — ABNORMAL LOW (ref 3.87–5.11)
RDW: 13.9 % (ref 11.5–15.5)
WBC: 6.2 10*3/uL (ref 4.0–10.5)
nRBC: 0 % (ref 0.0–0.2)

## 2019-03-25 LAB — GLUCOSE, CAPILLARY
Glucose-Capillary: 92 mg/dL (ref 70–99)
Glucose-Capillary: 97 mg/dL (ref 70–99)

## 2019-03-25 LAB — MPO/PR-3 (ANCA) ANTIBODIES
ANCA Proteinase 3: <3.5 U/mL (ref 0.0–3.5)
Myeloperoxidase Abs: <9 U/mL (ref 0.0–9.0)

## 2019-03-25 MED ORDER — SODIUM BICARBONATE 650 MG PO TABS: 650.0000 mg | ORAL_TABLET | Freq: Two times a day (BID) | ORAL | 3 refills | Status: AC

## 2019-03-25 MED ORDER — FUROSEMIDE 80 MG PO TABS: 80.0000 mg | ORAL_TABLET | Freq: Two times a day (BID) | ORAL | 3 refills | Status: DC

## 2019-03-25 MED ORDER — CALCIUM CARBONATE ANTACID 500 MG PO CHEW: 1.0000 | CHEWABLE_TABLET | Freq: Three times a day (TID) | ORAL | 3 refills | Status: DC

## 2019-03-25 MED ORDER — AMLODIPINE BESYLATE 5 MG PO TABS: 5.0000 mg | ORAL_TABLET | Freq: Every day | ORAL | 3 refills | Status: DC

## 2019-03-25 NOTE — Progress Notes (Signed)
Patient ID: Amber Dixon, female   DOB: 07-16-1950, 68 y.o.   MRN: [id number] S: Feels well without new complaints.  Denies n/v/d or anorexia O:BP (!) 162/73 (BP Location: Left Arm)   Pulse 79   Temp 98.4 F (36.9 C) (Oral)   Resp 16   Wt 35.5 kg   SpO2 94%   Intake/Output Summary (Last 24 hours) at 03/25/2019 1322 Last data filed at 03/25/2019 0900 Gross per 24 hour  Intake 840 ml  Output 350 ml  Net 490 ml   Intake/Output: I/O last 3 completed shifts: In: 1260 [P.O.:1260] Out: 1300 [Urine:1300]  Intake/Output this shift:  Total I/O In: 120 [P.O.:120] Out: -  Weight change: -0.6 kg Gen: NAD CVS: no rub Resp: cta Abd: +BS, soft, NT/ND Ext: no edema  Recent Labs  Lab 03/21/19 1617 03/21/19 1752 03/21/19 1817 03/22/19 0300 03/22/19 0500 03/23/19 0531 03/24/19 0436 03/25/19 0346  NA 130* 129*  --   --  133* 135 133* 132*  K 5.3* 5.4*  --   --  4.7 4.4 4.3 4.6  CL 100 102  --   --  101 104 99 98  CO2 15*  --   --   --  17* 17* 17* 17*  GLUCOSE 133* 87  --   --  70 95 84 78  BUN 109* 121*  --   --  104* 108* 107* 108*  CREATININE 8.49* 9.30*  --   --  8.02* 8.57* 9.04* 9.19*  ALBUMIN 2.4*  --   --   --  2.3* 2.3* 2.1* 2.1*  CALCIUM 6.5*  --   --  6.3 6.7* 6.8* 6.9* 6.8*  PHOS  --   --  7.4*  --   --  7.9* 7.4* 7.2*  AST 16  --   --   --  15  --   --   --   ALT 13  --   --   --  10  --   --   --    Liver Function Tests: Recent Labs  Lab 03/21/19 1617 03/22/19 0500 03/23/19 0531 03/24/19 0436 03/25/19 0346  AST 16 15  --   --   --   ALT 13 10  --   --   --   ALKPHOS 127* 119  --   --   --   BILITOT 0.4 0.5  --   --   --   PROT 5.5* 5.3*  --   --   --   ALBUMIN 2.4* 2.3* 2.3* 2.1* 2.1*   Recent Labs  Lab 03/21/19 1617  LIPASE 65*   No results for input(s): AMMONIA in the last 168 hours. CBC: Recent Labs  Lab 03/21/19 1617  03/22/19 0500 03/24/19 0436 03/25/19 0346  WBC 5.8  --  6.4 8.1 6.2  NEUTROABS 4.8  --   --   --   --   HGB 6.0*   < >  10.8* 10.9* 10.7*  HCT 18.5*   < > 32.6* 31.9* 32.7*  MCV 86.9  --  84.5 83.1 85.2  PLT 231  --  209 236 236   < > = values in this interval not displayed.   Cardiac Enzymes: No results for input(s): CKTOTAL, CKMB, CKMBINDEX, TROPONINI in the last 168 hours. CBG: Recent Labs  Lab 03/24/19 1151 03/24/19 1614 03/24/19 2106 03/25/19 0714 03/25/19 1115  GLUCAP 156* 171* 94 92 97    Iron Studies:  Recent Labs  03/23/19 0531  IRON 56  TIBC 199*   Studies/Results: No results found. . sodium chloride   Intravenous Once  . amLODipine  5 mg Oral Daily  . aspirin  324 mg Oral Once  . calcium carbonate  2 tablet Oral TID WC  . furosemide  80 mg Oral BID  . insulin aspart  0-9 Units Subcutaneous TID WC  . sodium bicarbonate  650 mg Oral BID  . sodium chloride flush  3 mL Intravenous Q12H    BMET    Component Value Date/Time   NA 132 (L) 03/25/2019 0346   K 4.6 03/25/2019 0346   CL 98 03/25/2019 0346   CO2 17 (L) 03/25/2019 0346   GLUCOSE 78 03/25/2019 0346   BUN 108 (H) 03/25/2019 0346   CREATININE 9.19 (H) 03/25/2019 0346   CALCIUM 6.8 (L) 03/25/2019 0346   CALCIUM 6.3 03/22/2019 0300   GFRNONAA 4 (L) 03/25/2019 0346   GFRAA 5 (L) 03/25/2019 0346   CBC    Component Value Date/Time   WBC 6.2 03/25/2019 0346   RBC 3.84 (L) 03/25/2019 0346   HGB 10.7 (L) 03/25/2019 0346   HCT 32.7 (L) 03/25/2019 0346   PLT 236 03/25/2019 0346   MCV 85.2 03/25/2019 0346   MCH 27.9 03/25/2019 0346   MCHC 32.7 03/25/2019 0346   RDW 13.9 03/25/2019 0346   LYMPHSABS 0.6 (L) 03/21/2019 1617   MONOABS 0.4 03/21/2019 1617   EOSABS 0.1 03/21/2019 1617   BASOSABS 0.0 03/21/2019 1617    Assessment/Plan: 1. Stage V CKD: Long standing DM/HTN. Severe proteinuria (20g) and shrunken kidneys with ^ echogenicity on [place]. Doubt any reversibility - she is very close to needing dialysis, GFR 5. ACEi held on admit. No uremic symptoms except fatigue. No hyperkalemia, does have metabolic  acidosis.  ** Discussed current situation with family via Optometrist. I again reviewed the chronicity and severity of her CKD and the fact that she is very close to starting dialysis.  The family discussed the situation and have decided not to proceed with dialysis at this time despite discussing the risk of death.  They want to get her home and then send her back to [place] to see her doctor as she has health insurance there.  I urged to send her home sooner rather than later as I don't think she will be able to go without HD for long. She is not a citizen of [place] - here on travel visa - which becomes an issue in itself for dialysis (would ONLY qualify for emergency medicaid). Her best bet would be to travel back to [place] immediately and f/u with her physician there to start dialysis. Her brother was in agreement with that and wants her to come home today. Will start Na bicarb 650mg  BID.  2. HTN/volume: BP high - CXR 11/27 with pulm edema. Given lasix 60mg  11/28-11/29, now ^ to 80mg  BID. No LE edema or dyspnea. If BP doesn't come down, will ^ amlodipine to 10mg . 3. Anemia (CKD): Hgb extremely low on admit - s/p 2U PRBCs 11/27. Will need ESA in near future - await plan. 4. Secondary hyperparathyroidism:  PTH 269 - Ca low/Phos high. Was started on Tums TID. Monitor. 5. Type 2 DM: Continue SSI. 6.  Disposition- for discharge to home today and then to f/u with her physician in [place] as soon as possible.  Donetta Potts, MD [employer] [phone]

## 2019-03-25 NOTE — Discharge Instructions (Signed)
You were admitted to the hospital for chest pain and shortness of breath which were likely due to your kidney failure. Your kidney failure is probably due to long-term hypertension.  Your kidney failure has likely not been previously identified because your kidney function was never checked.  We also identified anemia or a low blood level. Your anemia is also due to your kidney failure.  You received 2 units of blood for this.  We discussed with you that your kidney failure can be fatal if not treated with dialysis.  There are challenges in obtaining dialysis in the [place] while on a visa or an expired visa.  If you want dialysis to prevent death from kidney failure, we recommend returning to [place] where you have insurance.  If you do not want dialysis, that is your choice as well.  You may have recurrence of your chest pain and shortness of breath while your renal failure is untreated.  Please go home and discuss with your family your wishes and goals.  Please follow-up with the kidney doctor within the week.   Enfermedad renal en etapa terminal End-Stage Kidney Disease La enfermedad renal en etapa terminal ocurre cuando los riones estn tan daados que dejan de funcionar y ya no tienen mejora. Esta enfermedad se conoce como enfermedad renal en etapa terminal. Los riones son dos rganos que desempean muchas funciones importantes en el cuerpo, que incluyen las siguientes:  Radiographer, therapeutic desechos y el exceso de lquido de Herbalist.  Producir hormonas que [employer] cantidad de lquido [employer] tejidos y los vasos sanguneos.  Mantener la cantidad correcta de lquidos y de sustancias qumicas en el cuerpo. Sin un buen funcionamiento de los riones, las toxinas se Therapist, art sangre y pueden ocurrir complicaciones que ponen en riesgo la vida. Cules son las causas? Esta enfermedad generalmente ocurre cuando una enfermedad renal a largo plazo (crnica) empeora y provoca un dao permanente en  los riones. Tambin puede originarse por un dao repentino en los riones (lesin renal aguda). Las causas de esta afeccin incluyen lo siguiente:  Tener antecedentes familiares de enfermedad renal crnica (ERC).  Tener enfermedad renal crnica durante muchos aos.  Otras afecciones mdicas crnicas que afectan los riones, tales como: ? Enfermedad cardiovascular, incluida hipertensin arterial. ? Diabetes. ? Algunas enfermedades que afectan el sistema de defensa del organismo contra enfermedades (inmunitario).  Abuso de medicamentos de venta libre para Glass blower/designer.  Estar cerca de sustancias venenosas (txicas) o entrar en contacto con ellas. Qu incrementa el riesgo? Los siguientes factores pueden hacer que usted sea ms propenso a Best boy esta afeccin:  Ser mayor de [id number].  Ser varn.  Ser descendiente de afroamericanos, nativos americanos, hispanos, asiticos o isleos del Pacfico.  Fumar o tener antecedentes de tabaquismo.  Obesidad. Cules son los signos o los sntomas? Los sntomas de esta afeccin incluyen los siguientes:  Hinchazn (edema) de la cara, las piernas, los tobillos o los pies.  Adormecimiento, hormigueo o prdida de la sensibilidad en las manos o los pies.  Cansancio (letargo).  Nuseas o vmitos.  Confusin, dificultad para concentrarse o prdida de la conciencia.  Dolor en el pecho.  Falta de aire.  Orinar poco o no orinar.  Contracciones y calambres musculares, especialmente en las piernas.  Piel seca y que pica.  Prdida del apetito.  Piel plida debido a la anemia, lo que incluye la piel y el tejido alrededor del ojo (conjuntiva).  Dolores de [place].  La piel se oscurece  o se aclara de manera anormal.  Disminucin de la masa muscular (atrofia muscular).  Aparecen hematomas con facilidad.  Hipo frecuente.  Interrupcin del perodo [employer].  Movimientos espasmdicos (convulsiones). Cmo se  diagnostica? Esta afeccin se puede diagnosticar en funcin de lo siguiente:  Un examen fsico, lo que incluye mediciones de la presin arterial.  Anlisis de orina.  Anlisis de [place].  Estudios de diagnstico por imgenes.  Un estudio en el que se toma una muestra de tejido de los riones para examinarla con un microscopio (biopsia de rin). Cmo se trata? El tratamiento de esta afeccin puede incluir lo siguiente:  Un procedimiento que elimina los desechos txicos del organismo (dilisis). Hay dos tipos de dilisis: ? La que se realiza a travs del abdomen (dilisis peritoneal). Se puede hacer varias veces al da. ? Dilisis que se hace con ayuda de una mquina (hemodilisis). Se puede hacer varias veces por semana.  Ciruga para recibir un nuevo rin (trasplante de rin). Adems de la dilisis o de un trasplante de rin, tal vez deba tomar medicamentos:  Para controlar la presin arterial elevada (hipertensin).  Para controlar el colesterol.  Para tratar la diabetes.  Para mantener un nivel saludable de minerales en la sangre (electrolitos). Tambin pueden indicarle que siga una dieta especfica que incluye requerimientos o lmites para lo siguiente:  Sal (sodio).  Protenas.  Fsforo.  Potasio.  Calcio. Siga estas indicaciones en su casa: Medicamentos  [employer] de venta libre y los recetados solamente como se lo haya indicado el mdico.  No tome ningn medicamento, vitamina o suplemento mineral nuevo, a menos que lo haya autorizado el mdico. Muchos medicamentos y suplementos pueden empeorar el dao renal.  Siga las indicaciones del mdico en cuanto al ajuste de la dosis de cualquier medicamento que tome. Estilo de vida  No consuma ningn producto que contenga nicotina o tabaco, como cigarrillos y Psychologist, sport and exercise. Si necesita ayuda para dejar de fumar, consulte al mdico.  [place] y [place] un peso saludable. Si necesita ayuda  para lograrlo, consulte a su mdico.  Comience o contine un plan de ejercicios. Haga ejercicios al menos 81minutos al da, 5das a la semana.  Siga el plan alimenticio prescrito. Instrucciones generales  Est al da con las vacunas (inmunizaciones) como se lo haya indicado el mdico.  Lleve un control de la presin arterial. Informe los cambios en la presin arterial como se lo haya indicado el mdico.  Si recibe tratamiento para la diabetes, controle y Recruitment consultant un seguimiento de los niveles de Location manager en la sangre (glucemia) como se lo haya indicado el mdico.  Concurra a todas las visitas de control como se lo haya indicado el mdico. Esto es importante. Dnde encontrar ms informacin  Asociacin Americana de Pacientes Renales (American Association of Kidney Patients, AAKP): BombTimer.gl  [place]): www.kidney.org  Fondo Americano para Problemas Renales ([place]): https://mathis.com/  Programa de Rehabilitacin de Life Options: www.lifeoptions.org y www.kidneyschool.org Comunquese con un mdico si:  Los sntomas empeoran.  Presenta nuevos sntomas. Solicite ayuda de inmediato si:  Siente debilidad en un brazo o una pierna de un lado del cuerpo.  Tiene dificultad para hablar o habla arrastrando las palabras.  Tiene cambios repentinos en la visin.  Sufre una cefalea intensa.  Aumenta repentinamente de peso.  Tiene dificultad para respirar.  Los sntomas empeoran repentinamente. Resumen  La enfermedad renal en etapa terminal ocurre cuando los riones estn tan daados que dejan  de funcionar y ya no tienen mejora.  Sin un buen funcionamiento de los riones, las toxinas se Therapist, art sangre y pueden ocurrir complicaciones que ponen en riesgo la vida.  El tratamiento puede incluir dilisis o un trasplante de rin adems de medicamentos y cambios en el estilo de vida. Esta informacin no tiene Hydrologist el consejo del mdico. Asegrese de hacerle al mdico cualquier pregunta que tenga. Document Released: 07/18/2007 Document Revised: 12/04/2016 Document Reviewed: 12/04/2016 Elsevier Patient Education  2020 [employer].

## 2019-03-25 NOTE — Progress Notes (Signed)
Subjective: Ms. [name] states that she feels fine today.  She denies chest pain or shortness of breath.  We had extensive conversation via video interpreter and in person interpreter regarding renal dysfunction and that this condition is fatal if untreated.  It is unclear if she understood or believes that she indeed has kidney failure.  We discussed that the treatment is dialysis and if she wants to pursue that the best option may be to return to [place] soon as there are multiple challenges in obtaining dialysis on a visa or an expired visa in the [place].  Patient and son are adamant that they want to go home and discuss further with her family.  Consults: Nephrology  Objective:  Vital signs in last 24 hours: Vitals:   03/24/19 0849 03/24/19 2105 03/25/19 0533 03/25/19 0809  BP: (!) 163/77 (!) 153/80 (!) 156/74 (!) 162/73  Pulse: 84 85 77 79  Resp: 18 15 16 16   Temp: 98.8 F (37.1 C) 99.3 F (37.4 C) 98.9 F (37.2 C) 98.4 F (36.9 C)  TempSrc: Oral Oral Oral Oral  SpO2: 95% 96% 96% 94%  Weight:   35.5 kg    Physical Exam  Constitutional: No distress.  Small, elderly-appearing lady  HENT:  Head: Normocephalic and atraumatic.  Cardiovascular: Normal rate, regular rhythm, normal heart sounds and intact distal pulses.  No murmur heard. Pulmonary/Chest: Effort normal. No respiratory distress. She exhibits no tenderness.  Basilar crackles  Abdominal: Soft. Bowel sounds are normal. She exhibits no distension. There is no abdominal tenderness.  Musculoskeletal: Normal range of motion.  Neurological: She is alert.  Skin: Skin is warm and dry. She is not diaphoretic. No erythema.  Psychiatric: Affect normal.  Nursing note and vitals reviewed.  I/Os:  Intake/Output Summary (Last 24 hours) at 03/25/2019 1150 Last data filed at 03/25/2019 0900 Gross per 24 hour  Intake 840 ml  Output 350 ml  Net 490 ml   Labs: Results for orders placed or performed during the hospital encounter of  03/21/19 (from the past 24 hour(s))  Glucose, capillary     Status: Abnormal   Collection Time: 03/24/19 11:51 AM  Result Value Ref Range   Glucose-Capillary 156 (H) 70 - 99 mg/dL  Glucose, capillary     Status: Abnormal   Collection Time: 03/24/19  4:14 PM  Result Value Ref Range   Glucose-Capillary 171 (H) 70 - 99 mg/dL  Glucose, capillary     Status: None   Collection Time: 03/24/19  9:06 PM  Result Value Ref Range   Glucose-Capillary 94 70 - 99 mg/dL  Renal function panel     Status: Abnormal   Collection Time: 03/25/19  3:46 AM  Result Value Ref Range   Sodium 132 (L) 135 - 145 mmol/L   Potassium 4.6 3.5 - 5.1 mmol/L   Chloride 98 98 - 111 mmol/L   CO2 17 (L) 22 - 32 mmol/L   Glucose, Bld 78 70 - 99 mg/dL   BUN 108 (H) 8 - 23 mg/dL   Creatinine, Ser 9.19 (H) 0.44 - 1.00 mg/dL   Calcium 6.8 (L) 8.9 - 10.3 mg/dL   Phosphorus 7.2 (H) 2.5 - 4.6 mg/dL   Albumin 2.1 (L) 3.5 - 5.0 g/dL   GFR calc non Af Amer 4 (L) >60 mL/min   GFR calc Af Amer 5 (L) >60 mL/min   Anion gap 17 (H) 5 - 15  CBC     Status: Abnormal   Collection Time: 03/25/19  3:46 AM  Result Value Ref Range   WBC 6.2 4.0 - 10.5 K/uL   RBC 3.84 (L) 3.87 - 5.11 MIL/uL   Hemoglobin 10.7 (L) 12.0 - 15.0 g/dL   HCT 32.7 (L) 36.0 - 46.0 %   MCV 85.2 80.0 - 100.0 fL   MCH 27.9 26.0 - 34.0 pg   MCHC 32.7 30.0 - 36.0 g/dL   RDW 13.9 11.5 - 15.5 %   Platelets 236 150 - 400 K/uL   nRBC 0.0 0.0 - 0.2 %  Glucose, capillary     Status: None   Collection Time: 03/25/19  7:14 AM  Result Value Ref Range   Glucose-Capillary 92 70 - 99 mg/dL  Glucose, capillary     Status: None   Collection Time: 03/25/19 11:15 AM  Result Value Ref Range   Glucose-Capillary 97 70 - 99 mg/dL    Assessment/Plan:  Assessment: Ms. [name] is a 68 year old Spanish-speaking female with a past medical history of hypertension and type 2 diabetes without comprehensive medical care who presents with a 2-day history of left-sided chest pain and  SOB found to have a Hgb of 5.4, a Creatinine of 8.49 and a BUN of 109 whose picture appears to be consistent with complications from chronic renal failure.  Plan: Principal Problem:   Renal failure (HCC)/Proteinuria/Hypokalemia/Hyperphosphatemia  -pt presented to hospital with chest pain and shortness of breath found to be in renal failure with a GFR of 4, a Creatinine of almost 9, elevated K, elevated Phos and some possible uremic sx including decreased appetite and metallic taste to her food  -CT of the kidneys demonstrate findings consistent with chronic kidney disease -patient reports no hx of kidney disease but only goes to the doctor for blood pressure and blood sugar checks; she was on an Ace Inhibitor prior to admission -given inconsistent healthcare follow-up, this is thought to be a worsening of her chronic renal failure; given how well-appearing and well compensated the patient is, this is unlikely an acute renal failure; also, CT with findings consistent with chronic renal failure -Nephrology consulted who believes this to be chronic kidney disease, stage V without urgent indication for RRT and given pt not amenable to dialysis at this time she may be a candidate for dc on po lasix today or tomorrow w/ follow up until she leaves to return home to [place] -this is a difficult social situation and patient could become sick again requiring hospitalization; may not be recommended that patient remain in the [place] until the end of March as she planned as issues with visas and insurance can become tricky -Had extensive conversations with family who did not seem to understand or believe that patient has renal failure as she was feeling well prior to this presentation to the hospital and since resolution of chest pain and SOB feels well again -both our team and the nephrologists have made extensive effort to make clear to patient the severity of her illness and they are adamant about wishing to forego  dialysis at this time and to discuss the situation further with her family when she gets home  Plan: -continue 80 milligrams Lasix twice daily -dc home with quick nephro follow up   Active Problems:    Chest pain/SOB: -Patient presents with left-sided chest pain most significantly under her left breast that was both pleuritic and tender to palpation; she also had associated shortness of breath without fever or other respiratory symptoms and without sick contacts who had a negative ACS  work up whose sx were thought to be secondary to a pericarditis from uremia and SOB from increased fluid in her lungs -treated with 80 mg lasix IV BID per nephrology -no CP or SOB today; decreased crackles on exam  Plan: -continue diuresis with 80 mg Lasix twice daily     Anemia of renal disease -pt presented to hospital with chest pain and shortness of breath found to have a hemoglobin of 6.0 which was confirmed on repeat collection -This was presumed to be associated with her presumed chronic renal disease -Patient denied any bleeding in her urine or stool and FOBT negative -Reticulocytes inappropriately low suggesting a production issue -patient s/p 2 units of PRBCs with repeat Hgb of 10.8   Plan:  -monitor Hgb with CBC -Possible ESA in the future     Hypertension -Patient with elevated BP on admission around 190/80 in setting of not taking her blood pressure medicine around a month -Was on captopril prior to admission -Given her kidney function will not continue ACE inhibitor and start amlodipine 5 mg  Plan: -continue amlodipine 5 mg and diuresis as above    DM2 -SSI    Secondary hyperparathyroidism -PTH 269, Calow, Phos high -started on Tums tid per nephro   Dispo: Anticipated discharge today.  Al Decant, MD 03/25/2019, 11:50 AM Pager: [phone]

## 2019-03-25 NOTE — TOC Progression Note (Signed)
Transition of Care [facility]) - Progression Note    Patient Details  Name: Amber Dixon MRN: [id number] Date of Birth: 11-02-1950  [facility]) CM/SW Contact  Bartholomew Crews, RN Phone Number: [phone] 03/25/2019, 10:03 AM  Clinical Narrative:     Hospital follow up appointment scheduled for Tuesday, May 13, 2019 at 9:20am at [facility] [place].   This was the first available appointment!  Expected Discharge Plan: Home/Self Care Barriers to Discharge: Continued Medical Work up  Expected Discharge Plan and Services Expected Discharge Plan: Home/Self Care                                               Social Determinants of Health (SDOH) Interventions    Readmission Risk Interventions No flowsheet data found.

## 2019-03-25 NOTE — Discharge Summary (Signed)
Name: Amber Dixon MRN: [id number] DOB: May 24, 1950 68 y.o. PCP: Patient, No Pcp Per  Date of Admission: 03/21/2019  3:51 PM Date of Discharge:  03/25/19 Attending Physician: No att. providers found  Discharge Diagnosis:  1. Renal Failure 2. Anemia of Renal Disease 3. Hypertension 4. DM  Discharge Medications: Allergies as of 03/25/2019   No Known Allergies     Medication List    STOP taking these medications   captopril 25 MG tablet Commonly known as: CAPOTEN     TAKE these medications   amLODipine 5 MG tablet Commonly known as: NORVASC Take 1 tablet (5 mg total) by mouth daily. Start taking on: March 26, 2019   calcium carbonate 500 MG chewable tablet Commonly known as: Tums Chew 1 tablet (200 mg of elemental calcium total) by mouth 3 (three) times daily.   furosemide 80 MG tablet Commonly known as: LASIX Take 1 tablet (80 mg total) by mouth 2 (two) times daily.   PRESCRIPTION MEDICATION Inject 6 Units into the skin daily. Medication:Insulin Vial   sodium bicarbonate 650 MG tablet Take 1 tablet (650 mg total) by mouth 2 (two) times daily.       Disposition and follow-up:   Amber Dixon was discharged from [facility] in Stable condition.  At the hospital follow up visit please address:   1.  Goals and wishes, particularly does patient want dialysis in the future or not and if so what are her plans regarding returning home to [place].  2.  Labs / imaging needed at time of follow-up: CBC to check hgb, RFTs to check creatinine and electrolytes.  3.  Pending labs/ test needing follow-up: na  Follow-up Appointments: Follow-up Information    Donato Heinz, MD Follow up in 1 week(s).   Specialty: Nephrology Contact information: [place] [id number] [address] Hospital Course by problem list:  1. Renal Failure -pt presented to hospital with chest pain and shortness of breath found to be in  renal failure with a GFR of 4, a Creatinine of almost 9, elevated K, elevated Phos and some possible uremic sx including decreased appetite and metallic taste to her food with presenting sx of chest pain and SOB -CT of the kidneys demonstrated findings consistent with chronic kidney disease -patient reported no hx of kidney disease but only goes to the doctor for blood pressure and blood sugar checks; she was on an Ace Inhibitor prior to admission -given inconsistent healthcare follow-up, this is thought to be a worsening of her chronic renal failure; given how well compensated the patient is, this is unlikely an acute renal failure; also, CT with findings consistent with chronic renal failure -Nephrology consulted who believes this to be chronic kidney disease, stage V without urgent indication for RRT  -this is a difficult social situation and patient could become sick again requiring hospitalization; may not be recommended that patient remain in the [place] until the end of March as she planned as issues with visas and insurance can become tricky -Had extensive conversations with family who did not seem to understand or believe that patient has renal failure as she was feeling well prior to this presentation to the hospital and since resolution of chest pain and SOB feels well again -both our team and the nephrologists have made extensive effort to make clear to patient the severity of her illness and patient and her son are adamant about wishing to  forego dialysis at this time and to discuss the situation further with her family when she gets home  2. Anemia of Renal Disease -pt presented to hospital with chest pain and shortness of breath found to have a hemoglobin of 5.4 which was confirmed on repeat collection -This was presumed to be associated with her presumed chronic renal disease -Patient denied any bleeding in her urine or stool and FOBT negative -Reticulocytes inappropriately low suggesting a  production issue -patient s/p 2 units of PRBCs with repeat Hgb of 10.8   3. Hypertension -Patient with elevated BP on admission around 190/80 in setting of not taking her blood pressure medicine around a month -Was on captopril prior to admission -Given her kidney function will not continue ACE inhibitor and start amlodipine 5 mg with diuresis as above  Discharge Vitals:   BP (!) 162/73 (BP Location: Left Arm)   Pulse 79   Temp 98.4 F (36.9 C) (Oral)   Resp 16   Wt 35.5 kg   SpO2 94%   Pertinent Labs, Studies, and Procedures:  BMP Latest Ref Rng & Units 03/25/2019 03/24/2019 03/23/2019  Glucose 70 - 99 mg/dL 78 84 95  BUN 8 - 23 mg/dL 108(H) 107(H) 108(H)  Creatinine 0.44 - 1.00 mg/dL 9.19(H) 9.04(H) 8.57(H)  Sodium 135 - 145 mmol/L 132(L) 133(L) 135  Potassium 3.5 - 5.1 mmol/L 4.6 4.3 4.4  Chloride 98 - 111 mmol/L 98 99 104  CO2 22 - 32 mmol/L 17(L) 17(L) 17(L)  Calcium 8.9 - 10.3 mg/dL 6.8(L) 6.9(L) 6.8(L)   CBC Latest Ref Rng & Units 03/25/2019 03/24/2019 03/22/2019  WBC 4.0 - 10.5 K/uL 6.2 8.1 6.4  Hemoglobin 12.0 - 15.0 g/dL 10.7(L) 10.9(L) 10.8(L)  Hematocrit 36.0 - 46.0 % 32.7(L) 31.9(L) 32.6(L)  Platelets 150 - 400 K/uL 236 236 209   US Renal IMPRESSION: Increased echotexture within the kidneys bilaterally compatible with chronic medical renal disease.  1.6 cm complex appearing cyst in the upper pole of the left kidney. This could be followed with repeat ultrasound in 6 months or further characterized with nonemergent contrast-enhanced CT or MRI.  Discharge Instructions: Discharge Instructions    Diet - low sodium heart healthy   Complete by: As directed    Increase activity slowly   Complete by: As directed       Signed: Al Decant, MD 03/25/2019, 3:17 PM   Pager: [phone]

## 2019-03-25 NOTE — Progress Notes (Signed)
DISCHARGE NOTE HOME Amber Dixon to be discharged Home per MD order. Discussed prescriptions and follow up appointments with the patient. Prescriptions given to patient; medication list explained in detail. Patient and son verbalized understanding.  Skin clean, dry and intact without evidence of skin break down, no evidence of skin tears noted. IV catheter discontinued intact. Site without signs and symptoms of complications. Dressing and pressure applied. Pt denies pain at the site currently. No complaints noted.  Patient free of lines, drains, and wounds.   An After Visit Summary (AVS) was printed and given to the patient. Patient escorted via wheelchair, and discharged home via private auto.  [name], RN

## 2019-03-26 LAB — ANTINUCLEAR ANTIBODIES, IFA: ANA Ab, IFA: NEGATIVE

## 2019-03-31 ENCOUNTER — Other Ambulatory Visit: Payer: Self-pay

## 2019-03-31 ENCOUNTER — Inpatient Hospital Stay (HOSPITAL_COMMUNITY)
Admission: EM | Admit: 2019-03-31 | Discharge: 2019-04-25 | DRG: 628 | Disposition: A | Discharge status: Home / Self Care | Payer: Self-pay | Attending: Internal Medicine | Admitting: Internal Medicine

## 2019-03-31 ENCOUNTER — Emergency Department (HOSPITAL_COMMUNITY): Payer: Self-pay

## 2019-03-31 ENCOUNTER — Encounter (HOSPITAL_COMMUNITY): Payer: Self-pay | Admitting: Emergency Medicine

## 2019-03-31 DIAGNOSIS — E11649 Type 2 diabetes mellitus with hypoglycemia without coma: Secondary | ICD-10-CM | POA: Diagnosis present

## 2019-03-31 DIAGNOSIS — I5032 Chronic diastolic (congestive) heart failure: Secondary | ICD-10-CM | POA: Diagnosis present

## 2019-03-31 DIAGNOSIS — J189 Pneumonia, unspecified organism: Secondary | ICD-10-CM | POA: Diagnosis present

## 2019-03-31 DIAGNOSIS — J9601 Acute respiratory failure with hypoxia: Secondary | ICD-10-CM | POA: Diagnosis present

## 2019-03-31 DIAGNOSIS — E8779 Other fluid overload: Principal | ICD-10-CM | POA: Diagnosis present

## 2019-03-31 DIAGNOSIS — E1122 Type 2 diabetes mellitus with diabetic chronic kidney disease: Secondary | ICD-10-CM | POA: Diagnosis present

## 2019-03-31 DIAGNOSIS — Z23 Encounter for immunization: Secondary | ICD-10-CM

## 2019-03-31 DIAGNOSIS — J9690 Respiratory failure, unspecified, unspecified whether with hypoxia or hypercapnia: Secondary | ICD-10-CM

## 2019-03-31 DIAGNOSIS — E871 Hypo-osmolality and hyponatremia: Secondary | ICD-10-CM | POA: Diagnosis present

## 2019-03-31 DIAGNOSIS — R0902 Hypoxemia: Secondary | ICD-10-CM

## 2019-03-31 DIAGNOSIS — G44209 Tension-type headache, unspecified, not intractable: Secondary | ICD-10-CM | POA: Diagnosis not present

## 2019-03-31 DIAGNOSIS — I4581 Long QT syndrome: Secondary | ICD-10-CM | POA: Diagnosis present

## 2019-03-31 DIAGNOSIS — N186 End stage renal disease: Secondary | ICD-10-CM | POA: Diagnosis present

## 2019-03-31 DIAGNOSIS — N2581 Secondary hyperparathyroidism of renal origin: Secondary | ICD-10-CM | POA: Diagnosis present

## 2019-03-31 DIAGNOSIS — Z992 Dependence on renal dialysis: Secondary | ICD-10-CM

## 2019-03-31 DIAGNOSIS — E876 Hypokalemia: Secondary | ICD-10-CM | POA: Diagnosis not present

## 2019-03-31 DIAGNOSIS — Z789 Other specified health status: Secondary | ICD-10-CM

## 2019-03-31 DIAGNOSIS — Y95 Nosocomial condition: Secondary | ICD-10-CM | POA: Diagnosis present

## 2019-03-31 DIAGNOSIS — E872 Acidosis: Secondary | ICD-10-CM | POA: Diagnosis present

## 2019-03-31 DIAGNOSIS — R0602 Shortness of breath: Secondary | ICD-10-CM

## 2019-03-31 DIAGNOSIS — Z20822 Contact with and (suspected) exposure to covid-19: Secondary | ICD-10-CM | POA: Diagnosis present

## 2019-03-31 DIAGNOSIS — I959 Hypotension, unspecified: Secondary | ICD-10-CM | POA: Diagnosis not present

## 2019-03-31 DIAGNOSIS — D631 Anemia in chronic kidney disease: Secondary | ICD-10-CM | POA: Diagnosis present

## 2019-03-31 DIAGNOSIS — I132 Hypertensive heart and chronic kidney disease with heart failure and with stage 5 chronic kidney disease, or end stage renal disease: Secondary | ICD-10-CM | POA: Diagnosis present

## 2019-03-31 DIAGNOSIS — N179 Acute kidney failure, unspecified: Secondary | ICD-10-CM | POA: Diagnosis present

## 2019-03-31 DIAGNOSIS — R079 Chest pain, unspecified: Secondary | ICD-10-CM

## 2019-03-31 DIAGNOSIS — Z79899 Other long term (current) drug therapy: Secondary | ICD-10-CM

## 2019-03-31 LAB — FERRITIN: Ferritin: 258 ng/mL (ref 11–307)

## 2019-03-31 LAB — COMPREHENSIVE METABOLIC PANEL
ALT: 13 U/L (ref 0–44)
AST: 28 U/L (ref 15–41)
Albumin: 2.5 g/dL — ABNORMAL LOW (ref 3.5–5.0)
Alkaline Phosphatase: 149 U/L — ABNORMAL HIGH (ref 38–126)
Anion gap: 18 — ABNORMAL HIGH (ref 5–15)
BUN: 93 mg/dL — ABNORMAL HIGH (ref 8–23)
CO2: 14 mmol/L — ABNORMAL LOW (ref 22–32)
Calcium: 6.8 mg/dL — ABNORMAL LOW (ref 8.9–10.3)
Chloride: 89 mmol/L — ABNORMAL LOW (ref 98–111)
Creatinine, Ser: 9.97 mg/dL — ABNORMAL HIGH (ref 0.44–1.00)
GFR calc Af Amer: 4 mL/min — ABNORMAL LOW (ref >60)
GFR calc non Af Amer: 4 mL/min — ABNORMAL LOW (ref >60)
Glucose, Bld: 61 mg/dL — ABNORMAL LOW (ref 70–99)
Potassium: 4.2 mmol/L (ref 3.5–5.1)
Sodium: 121 mmol/L — ABNORMAL LOW (ref 135–145)
Total Bilirubin: 0.8 mg/dL (ref 0.3–1.2)
Total Protein: 6.1 g/dL — ABNORMAL LOW (ref 6.5–8.1)

## 2019-03-31 LAB — URINALYSIS, ROUTINE W REFLEX MICROSCOPIC
Bilirubin Urine: NEGATIVE
Glucose, UA: 50 mg/dL — AB
Ketones, ur: 5 mg/dL — AB
Leukocytes,Ua: NEGATIVE
Nitrite: NEGATIVE
Protein, ur: >=300 mg/dL — AB
Specific Gravity, Urine: 1.014 (ref 1.005–1.030)
pH: 6 (ref 5.0–8.0)

## 2019-03-31 LAB — CBC
HCT: 36.1 % (ref 36.0–46.0)
Hemoglobin: 12.5 g/dL (ref 12.0–15.0)
MCH: 28.2 pg (ref 26.0–34.0)
MCHC: 34.6 g/dL (ref 30.0–36.0)
MCV: 81.5 fL (ref 80.0–100.0)
Platelets: 348 10*3/uL (ref 150–400)
RBC: 4.43 MIL/uL (ref 3.87–5.11)
RDW: 12.9 % (ref 11.5–15.5)
WBC: 11.7 10*3/uL — ABNORMAL HIGH (ref 4.0–10.5)
nRBC: 0 % (ref 0.0–0.2)

## 2019-03-31 LAB — SODIUM, URINE, RANDOM: Sodium, Ur: 15 mmol/L

## 2019-03-31 LAB — LACTIC ACID, PLASMA
Lactic Acid, Venous: 1.9 mmol/L (ref 0.5–1.9)
Lactic Acid, Venous: 0.8 mmol/L (ref 0.5–1.9)

## 2019-03-31 LAB — BRAIN NATRIURETIC PEPTIDE: B Natriuretic Peptide: 327.7 pg/mL — ABNORMAL HIGH (ref 0.0–100.0)

## 2019-03-31 LAB — POC SARS CORONAVIRUS 2 AG -  ED: SARS Coronavirus 2 Ag: NEGATIVE

## 2019-03-31 LAB — BASIC METABOLIC PANEL
Anion gap: 16 — ABNORMAL HIGH (ref 5–15)
BUN: 91 mg/dL — ABNORMAL HIGH (ref 8–23)
CO2: 14 mmol/L — ABNORMAL LOW (ref 22–32)
Calcium: 6.3 mg/dL — CL (ref 8.9–10.3)
Chloride: 92 mmol/L — ABNORMAL LOW (ref 98–111)
Creatinine, Ser: 9.21 mg/dL — ABNORMAL HIGH (ref 0.44–1.00)
GFR calc Af Amer: 5 mL/min — ABNORMAL LOW (ref >60)
GFR calc non Af Amer: 4 mL/min — ABNORMAL LOW (ref >60)
Glucose, Bld: 59 mg/dL — ABNORMAL LOW (ref 70–99)
Potassium: 4.6 mmol/L (ref 3.5–5.1)
Sodium: 122 mmol/L — ABNORMAL LOW (ref 135–145)

## 2019-03-31 LAB — D-DIMER, QUANTITATIVE: D-Dimer, Quant: 5.73 ug/mL-FEU — ABNORMAL HIGH (ref 0.00–0.50)

## 2019-03-31 LAB — TRIGLYCERIDES: Triglycerides: 119 mg/dL (ref <150)

## 2019-03-31 LAB — C-REACTIVE PROTEIN: CRP: 0.8 mg/dL (ref <1.0)

## 2019-03-31 LAB — TROPONIN I (HIGH SENSITIVITY)
Troponin I (High Sensitivity): 11 ng/L (ref <18)
Troponin I (High Sensitivity): 16 ng/L (ref <18)

## 2019-03-31 LAB — FIBRINOGEN: Fibrinogen: 661 mg/dL — ABNORMAL HIGH (ref 210–475)

## 2019-03-31 LAB — OSMOLALITY, URINE: Osmolality, Ur: 298 mOsm/kg — ABNORMAL LOW (ref 300–900)

## 2019-03-31 LAB — LIPASE, BLOOD: Lipase: 38 U/L (ref 11–51)

## 2019-03-31 LAB — LACTATE DEHYDROGENASE: LDH: 288 U/L — ABNORMAL HIGH (ref 98–192)

## 2019-03-31 LAB — SARS CORONAVIRUS 2 (TAT 6-24 HRS): SARS Coronavirus 2: NEGATIVE

## 2019-03-31 LAB — PROCALCITONIN: Procalcitonin: 1.68 ng/mL

## 2019-03-31 LAB — OSMOLALITY: Osmolality: 280 mOsm/kg (ref 275–295)

## 2019-03-31 MED ORDER — HEPARIN SODIUM (PORCINE) 5000 UNIT/ML IJ SOLN
5000.0000 [IU] | Freq: Two times a day (BID) | INTRAMUSCULAR | Status: DC
  Administered 2019-03-31 – 2019-04-08 (×16): 5000 [IU] via SUBCUTANEOUS
  Filled 2019-03-31 (×16): qty 1

## 2019-03-31 MED ORDER — SODIUM CHLORIDE 0.9 % IV SOLN
1.0000 g | INTRAVENOUS | Status: DC
  Administered 2019-04-01: 1 g via INTRAVENOUS
  Filled 2019-03-31 (×2): qty 10

## 2019-03-31 MED ORDER — ONDANSETRON HCL 4 MG PO TABS
4.0000 mg | ORAL_TABLET | Freq: Three times a day (TID) | ORAL | Status: DC | PRN
  Administered 2019-03-31: 4 mg via ORAL
  Filled 2019-03-31: qty 1

## 2019-03-31 MED ORDER — CALCIUM CARBONATE ANTACID 500 MG PO CHEW
1.0000 | CHEWABLE_TABLET | Freq: Three times a day (TID) | ORAL | Status: DC
  Administered 2019-03-31 – 2019-04-01 (×2): 200 mg via ORAL
  Filled 2019-03-31 (×2): qty 1

## 2019-03-31 MED ORDER — ACETAMINOPHEN 325 MG PO TABS
650.0000 mg | ORAL_TABLET | Freq: Four times a day (QID) | ORAL | Status: DC | PRN
  Administered 2019-03-31 – 2019-04-23 (×7): 650 mg via ORAL
  Filled 2019-03-31 (×7): qty 2

## 2019-03-31 MED ORDER — SODIUM BICARBONATE 650 MG PO TABS
650.0000 mg | ORAL_TABLET | Freq: Three times a day (TID) | ORAL | Status: DC
  Administered 2019-03-31 – 2019-04-02 (×4): 650 mg via ORAL
  Filled 2019-03-31 (×9): qty 1

## 2019-03-31 MED ORDER — SODIUM CHLORIDE 0.9 % IV SOLN
500.0000 mg | Freq: Once | INTRAVENOUS | Status: AC
  Administered 2019-03-31: 500 mg via INTRAVENOUS
  Filled 2019-03-31: qty 500

## 2019-03-31 MED ORDER — VANCOMYCIN HCL IN DEXTROSE 750-5 MG/150ML-% IV SOLN
750.0000 mg | Freq: Once | INTRAVENOUS | Status: AC
  Administered 2019-03-31: 750 mg via INTRAVENOUS
  Filled 2019-03-31: qty 150

## 2019-03-31 MED ORDER — AZITHROMYCIN 250 MG PO TABS: 250.0000 mg | ORAL_TABLET | Freq: Every day | ORAL | Status: DC

## 2019-03-31 MED ORDER — CALCIUM CARBONATE ANTACID 500 MG PO CHEW: 1.0000 | CHEWABLE_TABLET | Freq: Three times a day (TID) | ORAL | Status: DC

## 2019-03-31 MED ORDER — SODIUM CHLORIDE 0.9 % IV SOLN
1000.0000 mL | INTRAVENOUS | Status: DC
  Administered 2019-03-31: 1000 mL via INTRAVENOUS

## 2019-03-31 MED ORDER — SODIUM CHLORIDE 0.9 % IV SOLN
1.0000 g | Freq: Once | INTRAVENOUS | Status: AC
  Administered 2019-03-31: 1 g via INTRAVENOUS
  Filled 2019-03-31: qty 1

## 2019-03-31 MED ORDER — ONDANSETRON HCL 4 MG/2ML IJ SOLN
4.0000 mg | Freq: Once | INTRAMUSCULAR | Status: AC
  Administered 2019-03-31: 4 mg via INTRAVENOUS
  Filled 2019-03-31: qty 2

## 2019-03-31 MED ORDER — VANCOMYCIN VARIABLE DOSE PER UNSTABLE RENAL FUNCTION (PHARMACIST DOSING): Status: DC

## 2019-03-31 MED ORDER — SODIUM CHLORIDE 0.9% FLUSH
3.0000 mL | Freq: Once | INTRAVENOUS | Status: AC
  Administered 2019-03-31: 3 mL via INTRAVENOUS

## 2019-03-31 MED ORDER — FUROSEMIDE 10 MG/ML IJ SOLN
80.0000 mg | Freq: Three times a day (TID) | INTRAMUSCULAR | Status: DC
  Administered 2019-03-31 – 2019-04-02 (×5): 80 mg via INTRAVENOUS
  Filled 2019-03-31 (×6): qty 8

## 2019-03-31 MED ORDER — AZITHROMYCIN 250 MG PO TABS
250.0000 mg | ORAL_TABLET | Freq: Every day | ORAL | Status: DC
  Administered 2019-04-01: 250 mg via ORAL
  Filled 2019-03-31: qty 1

## 2019-03-31 MED ORDER — FAMOTIDINE 20 MG PO TABS
20.0000 mg | ORAL_TABLET | Freq: Every day | ORAL | Status: DC
  Administered 2019-03-31 – 2019-04-25 (×20): 20 mg via ORAL
  Filled 2019-03-31 (×21): qty 1

## 2019-03-31 NOTE — ED Provider Notes (Signed)
[facility] Provider Note   CSN: [id number] Arrival date & time: 03/31/19  [id number]     History   Chief Complaint Chief Complaint  Patient presents with  . Headache  . Emesis    HPI Amber Dixon is a 68 y.o. female.     Pt presents to the ED today with sob, weakness, n/v and left sided chest pain.  Pt has had chills, but no fevers.  No known covid exposures.  The pt just came to the [place] from [place] about a month ago.  She came to this hospital on 11/27 and was found to be in renal failure.  GFR was only 4.  The pt was admitted from 11/27-12/1.  Nephrology was consulted and said that pt will need dialysis in the future.  The pt and her family did not want to start dialysis and wanted to wait until she went back home to [place] in March.  Nephrology recommended traveling back to [place] immediately and making arrangements to start dialysis immediately.  The pt did not do that.  Pt also required 2 units of prbcs while in the hospital.  Pt denies any sick contacts.  Pt is a Spanish only speaker.  Information is obtained via interpreter.  When I asked pt if she had medical problems, she only mentioned htn and dm.  History of renal failure is all from Epic chart review.     Past Medical History:  Diagnosis Date  . Diabetes mellitus without complication ([place])   . Hypertension     Patient Active Problem List   Diagnosis Date Noted  . Acute on chronic diastolic (congestive) heart failure ([place]) 03/24/2019  . Hypertension 03/22/2019  . Anemia of renal disease 03/22/2019  . Proteinuria 03/22/2019  . Chest pain 03/22/2019  . Acute on chronic renal failure ([place]) 03/21/2019    Past Surgical History:  Procedure Laterality Date  . LEG SURGERY       OB History   No obstetric history on file.      Home Medications    Prior to Admission medications   Medication Sig Start Date End Date Taking? Authorizing Provider  amLODipine  (NORVASC) 5 MG tablet Take 1 tablet (5 mg total) by mouth daily. 03/26/19   Al Decant, MD  calcium carbonate (TUMS) 500 MG chewable tablet Chew 1 tablet (200 mg of elemental calcium total) by mouth 3 (three) times daily. 03/25/19 03/24/20  Al Decant, MD  furosemide (LASIX) 80 MG tablet Take 1 tablet (80 mg total) by mouth 2 (two) times daily. 03/25/19 04/24/19  Al Decant, MD  PRESCRIPTION MEDICATION Inject 6 Units into the skin daily. Medication:Insulin Vial    [provider]  sodium bicarbonate 650 MG tablet Take 1 tablet (650 mg total) by mouth 2 (two) times daily. 03/25/19 04/24/19  Al Decant, MD    Family History No family history on file.  Social History Social History   Tobacco Use  . Smoking status: Never Smoker  . Smokeless tobacco: Never Used  Substance Use Topics  . Alcohol use: Not on file  . Drug use: Not on file     Allergies   Patient has no known allergies.   Review of Systems Review of Systems  Respiratory: Positive for shortness of breath.   Neurological: Positive for weakness.  All other systems reviewed and are negative.    Physical Exam Updated Vital Signs BP 139/64   Pulse 88   Temp 97.6 F (  36.4 C) (Oral)   Resp 18   SpO2 96%   Physical Exam Vitals signs and nursing note reviewed.  Constitutional:      General: She is in acute distress.  HENT:     Head: Normocephalic and atraumatic.     Mouth/Throat:     Mouth: Mucous membranes are moist.     Pharynx: Oropharynx is clear.  Eyes:     Extraocular Movements: Extraocular movements intact.     Pupils: Pupils are equal, round, and reactive to light.  Neck:     Musculoskeletal: Normal range of motion and neck supple.  Cardiovascular:     Rate and Rhythm: Regular rhythm. Tachycardia present.  Pulmonary:     Effort: Tachypnea and respiratory distress present.  Abdominal:     General: Bowel sounds are normal.     Palpations: Abdomen is soft.  Musculoskeletal: Normal  range of motion.  Skin:    General: Skin is warm and dry.     Capillary Refill: Capillary refill takes less than 2 seconds.  Neurological:     Mental Status: She is alert and oriented to person, place, and time.  Psychiatric:        Mood and Affect: Mood is anxious.      ED Treatments / Results  Labs (all labs ordered are listed, but only abnormal results are displayed) Labs Reviewed  COMPREHENSIVE METABOLIC PANEL - Abnormal; Notable for the following components:      Result Value   Sodium 121 (*)    Chloride 89 (*)    CO2 14 (*)    Glucose, Bld 61 (*)    BUN 93 (*)    Creatinine, Ser 9.97 (*)    Calcium 6.8 (*)    Total Protein 6.1 (*)    Albumin 2.5 (*)    Alkaline Phosphatase 149 (*)    GFR calc non Af Amer 4 (*)    GFR calc Af Amer 4 (*)    Anion gap 18 (*)    All other components within normal limits  CBC - Abnormal; Notable for the following components:   WBC 11.7 (*)    All other components within normal limits  D-DIMER, QUANTITATIVE (NOT AT [facility]) - Abnormal; Notable for the following components:   D-Dimer, Quant 5.73 (*)    All other components within normal limits  LACTATE DEHYDROGENASE - Abnormal; Notable for the following components:   LDH 288 (*)    All other components within normal limits  FIBRINOGEN - Abnormal; Notable for the following components:   Fibrinogen 661 (*)    All other components within normal limits  BRAIN NATRIURETIC PEPTIDE - Abnormal; Notable for the following components:   B Natriuretic Peptide 327.7 (*)    All other components within normal limits  SARS CORONAVIRUS 2 (TAT 6-24 HRS)  CULTURE, BLOOD (ROUTINE X 2)  CULTURE, BLOOD (ROUTINE X 2)  LIPASE, BLOOD  LACTIC ACID, PLASMA  PROCALCITONIN  FERRITIN  TRIGLYCERIDES  C-REACTIVE PROTEIN  URINALYSIS, ROUTINE W REFLEX MICROSCOPIC  LACTIC ACID, PLASMA  POC SARS CORONAVIRUS 2 AG -  ED  TROPONIN I (HIGH SENSITIVITY)  TROPONIN I (HIGH SENSITIVITY)    EKG EKG Interpretation   Date/Time:  Monday March 31 2019 08:58:18 EST Ventricular Rate:  87 PR Interval:    QRS Duration: 95 QT Interval:  424 QTC Calculation: 508 R Axis:   104 Text Interpretation: Sinus rhythm Borderline short PR interval Right axis deviation Borderline repolarization abnormality Prolonged QT interval No significant  change since last tracing Confirmed by Isla Pence [phone]) on 03/31/2019 9:25:26 AM   Radiology Dg Chest Portable 1 View  Result Date: 03/31/2019 CLINICAL DATA:  Headache, vomiting and fever. EXAM: PORTABLE CHEST 1 VIEW COMPARISON:  03/21/2019. FINDINGS: Trachea is midline. Heart is enlarged, stable. Progressive patchy airspace consolidation bilaterally, right greater than left. No pleural fluid. IMPRESSION: Worsening bilateral pneumonia, right greater than left. Followup PA and lateral chest X-ray is recommended in 3-4 weeks following trial of antibiotic therapy to ensure resolution and exclude underlying malignancy. Electronically Signed   By: Lorin Picket M.D.   On: 03/31/2019 09:25    Procedures Procedures (including critical care time)  Medications Ordered in ED Medications  0.9 %  sodium chloride infusion (1,000 mLs Intravenous New Bag/Given 03/31/19 0929)  ceFEPIme (MAXIPIME) 1 g in sodium chloride 0.9 % 100 mL IVPB (has no administration in time range)  vancomycin (VANCOCIN) IVPB 750 mg/150 ml premix (has no administration in time range)  vancomycin variable dose per unstable renal function (pharmacist dosing) (has no administration in time range)  sodium chloride flush (NS) 0.9 % injection 3 mL (3 mLs Intravenous Given 03/31/19 0922)  ondansetron (ZOFRAN) injection 4 mg (4 mg Intravenous Given 03/31/19 0929)     Initial Impression / Assessment and Plan / ED Course  I have reviewed the triage vital signs and the nursing notes.  Pertinent labs & imaging results that were available during my care of the patient were reviewed by me and considered in my medical  decision making (see chart for details).       Pt's O2 sat on RA was 84%.  She was placed on 2L, which only increased O2 to 89, so this was increased to 3.  O2 now in the mid-90s.  CXR shows multifocal pna as opposed to CHF.  Pt given vanc and maxipime.  The pt's initial covid Ag test negative, but the 6 hr test is pending.  All of her inflammatory markers are elevated.  D-dimer is elevated.  I think this is due to covid vs pna and also from elevated Cr.   With the video interpreter, the pt said she wants her sons to make the decision for dialysis.  The pt understands if she does not get dialysis, she will die.  With our house interpreter, I did a 3 way call with her son, Clifton James.  He is going to call his brothers in [place] and decide amongst them what to do.  I told him as well that if she does not get dialysis, she will die.  Pt's son, Clifton James agreed to go with dialysis if needed.  CRITICAL CARE Performed by: Isla Pence   Total critical care time: 60 minutes  Critical care time was exclusive of separately billable procedures and treating other patients.  Critical care was necessary to treat or prevent imminent or life-threatening deterioration.  Critical care was time spent personally by me on the following activities: development of treatment plan with patient and/or surrogate as well as nursing, discussions with consultants, evaluation of patient's response to treatment, examination of patient, obtaining history from patient or surrogate, ordering and performing treatments and interventions, ordering and review of laboratory studies, ordering and review of radiographic studies, pulse oximetry and re-evaluation of patient's condition.  Izabellah Stetzel was evaluated in Emergency Department on 03/31/2019 for the symptoms described in the history of present illness. She was evaluated in the context of the global COVID-19 pandemic, which necessitated consideration that the  patient might be at risk for infection with the SARS-CoV-2 virus that causes COVID-19. Institutional protocols and algorithms that pertain to the evaluation of patients at risk for COVID-19 are in a state of rapid change based on information released by regulatory bodies including the CDC and federal and state organizations. These policies and algorithms were followed during the patient's care in the ED.  Final Clinical Impressions(s) / ED Diagnoses   Final diagnoses:  Multifocal pneumonia  Acute respiratory failure with hypoxia ([place])  ESRD (end stage renal disease) ([place])  Hyponatremia  Language barrier affecting health care    ED Discharge Orders    None       Isla Pence, MD 03/31/19 1151

## 2019-03-31 NOTE — ED Notes (Addendum)
[phone] call this number if pt can have visitor

## 2019-03-31 NOTE — H&P (Addendum)
Date: 03/31/2019               Patient Name:  Amber Dixon MRN: [id number]  DOB: 11/01/1950 Age / Sex: 68 y.o., female   PCP: Patient, No Pcp Per         Medical Service: Internal Medicine Teaching Service         Attending Physician: Dr. Sid Falcon, MD    First Contact: Madilyn Fireman, MD, Lyndee Leo Pager: CL [phone])  Second Contact: Truman Hayward, MD, Vonna Kotyk Pager: Governor Rooks [phone])       After Hours (After 5p/  First Contact Pager: [phone]  weekends / holidays): Second Contact Pager: [phone]   Chief Complaint: dyspnea, chest pain  History of Present Illness: 68 y.o. yo female w/ PMH significant for CKD 5, HTN, .  Presents with chest pain and SOB, chills but no fever.  She reports this started about 8 days ago.  Both have worsened since leaving the hospital she felt it intermittently improved but started hurting again last night.  It hurts more when taking a deep breath.  She denies cough, she has not been around anyone sick. She thinks she was here last admission for chest pain as well and is not sure about her kidneys not working well.  She has had some  Nausea and poor appetite but no vomiting or diarrhea.  She knows she is in the hospital but is not sure which one, she knows we are in [place] but not sure of the city.  She knows it is december.  She is oriented to self.  Meds:  Current Outpatient Medications  Medication Instructions  . amLODipine (NORVASC) 5 mg, Oral, Daily  . calcium carbonate (TUMS) 500 MG chewable tablet 200 mg of elemental calcium, Oral, 3 times daily  . furosemide (LASIX) 80 mg, Oral, 2 times daily  . PRESCRIPTION MEDICATION 6 Units, Subcutaneous, Daily, Medication:Insulin Vial  . sodium bicarbonate 650 mg, Oral, 2 times daily    Allergies: Allergies as of 03/31/2019  . (No Known Allergies)   Past Medical History:  Diagnosis Date  . Diabetes mellitus without complication ([place])   . Hypertension     Family History: No family history on file.    Social History:  Social History   Tobacco Use  . Smoking status: Never Smoker  . Smokeless tobacco: Never Used  Substance Use Topics  . Alcohol use: Not on file  . Drug use: Not on file     Review of Systems: A complete ROS was negative except as per HPI.   Physical Exam: Blood pressure 140/70, pulse 88, temperature 97.6 F (36.4 C), temperature source Oral, resp. rate 19, SpO2 97 %. Physical Exam Constitutional:      General: She is not in acute distress.    Appearance: She is not diaphoretic.  Cardiovascular:     Rate and Rhythm: Normal rate and regular rhythm.     Heart sounds: Normal heart sounds. No murmur. No friction rub. No gallop.      Comments: JVP 11 Pulmonary:     Effort: Pulmonary effort is normal. No respiratory distress.     Breath sounds: Rales (bilateral upper and lower lung fields) present. No wheezing.  Chest:     Chest wall: No tenderness.  Abdominal:     General: Bowel sounds are normal. There is no distension.     Palpations: Abdomen is soft. There is no mass.     Tenderness: There is no abdominal tenderness.  There is no guarding or rebound.  Musculoskeletal:     Right lower leg: No edema.     Left lower leg: No edema.  Neurological:     Mental Status: She is alert.  Psychiatric:        Mood and Affect: Mood normal.        Behavior: Behavior normal.        Cognition and Memory: Memory is impaired.      EKG: personally reviewed my interpretation is heavy motion artifact, NSR  CXR: personally reviewed my interpretation is compared with prior x-ray there are multifocal interstitial and airspace opacities R>L    Assessment & Plan by Problem: Active Problems:   Acute respiratory failure with hypoxemia (HCC)  Acute respiratory failure: likely 2/2 volume overload from now ESRD.  Infection is possible given worsening right sided opacification, she has a mild elevation in WBC.  She has not had cough or fever.  She has been covered with abx in  the ED.    -consulted nephrology for HD -continue [place] supplemental O2 -given age and comorbidity cannot rule out superimposed pna continue abx -nasal MRSA test PCR  ESRD: last admission plan was for pt to establish care in [place].  Pt and son who called brother in [place] they would like to initiate HD.  -nephrology consulted  HTN: on amlodipine 5 and furosemide 80 BID  -hold with likely initiation of dialysis  Hyponatremia: Likely this is primarily hypervolemic hyponatremia however pt does report poor oral intake of food but it seems has been drinking a good amount of water.    -npo for now when on a diet will need fluid restriction -dialysis for volume -stopped IVF -serum osm, urine Na, urine osm -serial bmp's, nephrology is following  Chest Pain: currently chest pain free, left sided, seems to be pleuritic in nature, troponin not elevated x2 and ecg without ischemic changes  -continue to monitor, likely to improve with volume removal -tylenol PRN  Dispo: Admit patient to Inpatient with expected length of stay greater than 2 midnights.  Signed: Katherine Roan, MD 03/31/2019, 1:35 PM

## 2019-03-31 NOTE — Progress Notes (Signed)
CRITICAL VALUE ALERT  Critical Value: Calcium 6.3  Date & Time Notied:  03/31/19 at 2240  Provider Notified: Sharon Seller, MD  Orders Received/Actions taken: New orderfor calciumplaced

## 2019-03-31 NOTE — ED Notes (Signed)
Called lab to add on urine sodium and urine osmolarity.

## 2019-03-31 NOTE — ED Triage Notes (Signed)
Pt reports headache, vomiting and fever-unsure how high. Reports symptoms started 8 days ago. Pt spanish speaking only.

## 2019-03-31 NOTE — ED Notes (Signed)
Paged Dr Shan Levans

## 2019-03-31 NOTE — Consult Note (Addendum)
Reason for Consult: Renal Failure Referring Physician: Dr. Dareen Piano (Emergency)    Amber Dixon is an 68 y.o. female.  HPI: Amber Dixon has a past medical history of HTN, DMII, CKD stage V, anemia and secondary hyperparathryroid who presented to the emergency room today hypoxic with complaints of difficulty breathing, pain in her left side of her chest and vomiting. Of note, she was discharged 03/25/2019 for a 4 day hospitalization for renal failure in which she was advised she will need to get back to [place] to see her doctor for her renal failure and increasing need for dialysis.   She states that she has had difficulty breathing that was better with the emergency room medications and a decrease in energy. She complains of shortness of breath while talking with me. She endorses a funny taste in her mouth and states she has had vomit that is white and foamy prompting her to only eat oatmeal for the last 10 days. She endorses cramps in her lower left leg. She denies use of new medications. According to her and her son, she picked up the medications that were sent to her pharmacy at discharge including the furosemide and sodium bicarbonate, but has not taken them due to a burning in her stomach. She has continued to take her captopril which she was advised to stop taking at discharge.   Today, she states she had cold sweats on her way to the emergency room. She denies fever, headache, diarrhea, difficulty or changes in urination or stool. She denies any other pain that in her left side of her chest mentioned previously.   Trend in Creatinine: Creatinine, Ser  Date/Time Value Ref Range Status  03/31/2019 08:40 AM 9.97 (H) 0.44 - 1.00 mg/dL Final  03/25/2019 03:46 AM 9.19 (H) 0.44 - 1.00 mg/dL Final  03/24/2019 04:36 AM 9.04 (H) 0.44 - 1.00 mg/dL Final  03/23/2019 05:31 AM 8.57 (H) 0.44 - 1.00 mg/dL Final  03/22/2019 05:00 AM 8.02 (H) 0.44 - 1.00 mg/dL Final  03/21/2019  05:52 PM 9.30 (H) 0.44 - 1.00 mg/dL Final  03/21/2019 04:17 PM 8.49 (H) 0.44 - 1.00 mg/dL Final    PMH:   Past Medical History:  Diagnosis Date  . Diabetes mellitus without complication ([place])   . Hypertension     PSH:   Past Surgical History:  Procedure Laterality Date  . LEG SURGERY      Allergies: No Known Allergies  Medications:   Prior to Admission medications   Medication Sig Start Date End Date Taking? Authorizing Provider  amLODipine (NORVASC) 5 MG tablet Take 1 tablet (5 mg total) by mouth daily. 03/26/19  Yes Al Decant, MD  calcium carbonate (TUMS) 500 MG chewable tablet Chew 1 tablet (200 mg of elemental calcium total) by mouth 3 (three) times daily. 03/25/19 03/24/20 Yes Al Decant, MD  furosemide (LASIX) 80 MG tablet Take 1 tablet (80 mg total) by mouth 2 (two) times daily. 03/25/19 04/24/19 Yes Al Decant, MD  PRESCRIPTION MEDICATION Inject 6 Units into the skin daily. Medication:Insulin Vial   Yes [provider]  sodium bicarbonate 650 MG tablet Take 1 tablet (650 mg total) by mouth 2 (two) times daily. 03/25/19 04/24/19 Yes Al Decant, MD    Inpatient medications: . [START ON 04/01/2019] azithromycin  250 mg Oral Daily  . heparin  5,000 Units Subcutaneous Q12H    Discontinued Meds:   Medications Discontinued During This Encounter  Medication Reason  . 0.9 %  sodium chloride infusion   .  azithromycin (ZITHROMAX) tablet 250 mg   . vancomycin variable dose per unstable renal function (pharmacist dosing)     Social History:  reports that she has never smoked. She has never used smokeless tobacco. No history on file for alcohol and drug.  Family History:  No family history on file.  BP (!) 141/107   Pulse 93   Temp 97.6 F (36.4 C) (Oral)   Resp 13   SpO2 95%  Vitals:   03/31/19 1245 03/31/19 1330 03/31/19 1415 03/31/19 1500  BP: 140/70 (!) 147/74 139/66 (!) 141/107  Pulse: 88 88 88 93  Resp: 19 (!) 23 14 13   Temp:      TempSrc:       SpO2: 97% 98% 98% 95%     Physical Exam  Constitutional:  Thin elderly female, laying in bed appearing ill but in no acute distress  Cardiovascular: Normal rate, regular rhythm and normal heart sounds.  Respiratory: Effort normal.  Crackles heard bilaterally to level of mid scapula  GI:  Decreased bowel sounds. Abdomen is soft and distended, some tenderness noted in RUQ without guarding  Musculoskeletal:        General: No edema.  Skin: Skin is warm and dry.  Large ecchymosis on Lt superior forearm  Psychiatric: She has a normal mood and affect.    Labs: Basic Metabolic Panel: Recent Labs  Lab 03/25/19 0346 03/31/19 0840  NA 132* 121*  K 4.6 4.2  CL 98 89*  CO2 17* 14*  GLUCOSE 78 61*  BUN 108* 93*  CREATININE 9.19* 9.97*  ALBUMIN 2.1* 2.5*  CALCIUM 6.8* 6.8*  PHOS 7.2*  --    Liver Function Tests: Recent Labs  Lab 03/25/19 0346 03/31/19 0840  AST  --  28  ALT  --  13  ALKPHOS  --  149*  BILITOT  --  0.8  PROT  --  6.1*  ALBUMIN 2.1* 2.5*   Recent Labs  Lab 03/31/19 0840  LIPASE 38   CBC: Recent Labs  Lab 03/25/19 0346 03/31/19 0840  WBC 6.2 11.7*  HGB 10.7* 12.5  HCT 32.7* 36.1  MCV 85.2 81.5  PLT 236 348   CBG: Recent Labs  Lab 03/24/19 1614 03/24/19 2106 03/25/19 0714 03/25/19 1115  GLUCAP 171* 94 92 97    Iron Studies:  Recent Labs  Lab 03/31/19 0902  FERRITIN 258   Urinalysis    Component Value Date/Time   COLORURINE YELLOW 03/31/2019 1345   APPEARANCEUR CLOUDY (A) 03/31/2019 1345   LABSPEC 1.014 03/31/2019 1345   PHURINE 6.0 03/31/2019 1345   GLUCOSEU 50 (A) 03/31/2019 1345   HGBUR SMALL (A) 03/31/2019 1345   BILIRUBINUR NEGATIVE 03/31/2019 1345   KETONESUR 5 (A) 03/31/2019 1345   PROTEINUR >=300 (A) 03/31/2019 1345   NITRITE NEGATIVE 03/31/2019 1345   LEUKOCYTESUR NEGATIVE 03/31/2019 1345     Xrays/Other Studies: Dg Chest Portable 1 View  Result Date: 03/31/2019 CLINICAL DATA:  Headache, vomiting and fever.  EXAM: PORTABLE CHEST 1 VIEW COMPARISON:  03/21/2019. FINDINGS: Trachea is midline. Heart is enlarged, stable. Progressive patchy airspace consolidation bilaterally, right greater than left. No pleural fluid. IMPRESSION: Worsening bilateral pneumonia, right greater than left. Followup PA and lateral chest X-ray is recommended in 3-4 weeks following trial of antibiotic therapy to ensure resolution and exclude underlying malignancy. Electronically Signed   By: Lorin Picket M.D.   On: 03/31/2019 09:25     Assessment/Plan: 1. Acute on chronic kidney disease, Stage V. Patient did  not take medications prescribed from discharge. BUN 93, Creatinine 9.97. Urinalysis is positive for significant protein (+300). Chest Xray from today concerning for pneumonia, however there is still concern for edema caused by renal failure. Uremic symptoms include fatigue, vomiting, and dysgeusia.  - Furosemide 80mg  BID, Sodium Bicarbonate 650mg  BID  2. Metabolic Acidosis with anion gap, secondary to acute kidney injury. CO2 level 14, anion gap of 18. Should improve with sodium bicarb   3. Hyponatremia, likely secondary to impaired free water excretion, worsened by pulmonary process. Should improve with lasix and sodium bicarb   4. Secondary hyperparathyroidism, due to renal failure. Hypocalcemia corrected value of 8.0. - Calcium carbonate 500mg  TID  5. Anemia of chronic disease. Today is within normal limits at 12.5.   6. Diabetes Mellitis, Type II.  - Insulin regimen, with SSI  7. Hypertension. Patient and son advised not to start captopril - Amlodipine, should improve with volume unloading from lasix as well   Edwena Felty PA Student 03/31/2019, 3:52 PM

## 2019-03-31 NOTE — Progress Notes (Signed)
Renal Navigator contacted by Nephrologist regarding the options for OP HD for patient who is a [employer] here on a Charity fundraiser. Renal Navigator spoke with [facility], who states that, since patient is here documented, she will not qualify for any [employer], including Emergency Medicaid. She would have to pay for OP HD out of pocket, which would be astronomical in cost. Patient has previously been advised to return to [place] to see her MD to initiate HD.  Renal Navigator and Nephrologist met with patient with the assistance of Spanish Interpreter, and this was explained to patient.  Alphonzo Cruise, [place] Renal Navigator [phone]

## 2019-03-31 NOTE — Progress Notes (Signed)
Spanish interpretor used to communicate with patient.  Initially pt c/o left breast pain, no abnormality noted to her left breast. Since pt is admitted for pneumonia, RN reassessed pain again and this time pt reported pain to her left mid and upper chest. Pain gets worse with movements, cough and deep breaths. Pt reported pain being minimal or absent at the rest. Non productive cough reported by pt. VS stable, no signs of distress,pt SpO2 95 on 3L nasal cannula. Pt in no distress.  Seawell, MD aware of pt condition, and tylenol given for pain. Will continue to monitor pt closely.

## 2019-03-31 NOTE — Progress Notes (Signed)
Pharmacy Antibiotic Note  Amber Dixon is a 68 y.o. female admitted on 03/31/2019 with pneumonia.  Pharmacy has been consulted for vancomycin dosing. Also has cefepime ordered x 1 per EDP. Noted stage V CKD, very close to needing HD per recent Nephrology note on 03/25/19. SCr up a bit from last week to 9.97.  Plan: Cefepime 1g IV x 1 per EDP Vancomycin 750mg  IV x 1 F/u SCr trend and Nephrology plans prior to entering further vancomycin doses     Temp (24hrs), Avg:97.6 F (36.4 C), Min:97.6 F (36.4 C), Max:97.6 F (36.4 C)  Recent Labs  Lab 03/25/19 0346 03/31/19 0840 03/31/19 0902  WBC 6.2 11.7*  --   CREATININE 9.19* 9.97*  --   LATICACIDVEN  --   --  1.9    CrCl cannot be calculated (Unknown ideal weight.).    No Known Allergies  Elicia Lamp, PharmD, BCPS Please check AMION for all [place] contact numbers Clinical Pharmacist 03/31/2019 10:38 AM

## 2019-04-01 ENCOUNTER — Inpatient Hospital Stay (HOSPITAL_COMMUNITY): Payer: Self-pay

## 2019-04-01 LAB — RENAL FUNCTION PANEL
Albumin: 1.8 g/dL — ABNORMAL LOW (ref 3.5–5.0)
Anion gap: 17 — ABNORMAL HIGH (ref 5–15)
BUN: 90 mg/dL — ABNORMAL HIGH (ref 8–23)
CO2: 13 mmol/L — ABNORMAL LOW (ref 22–32)
Calcium: 6.1 mg/dL — CL (ref 8.9–10.3)
Chloride: 93 mmol/L — ABNORMAL LOW (ref 98–111)
Creatinine, Ser: 8.84 mg/dL — ABNORMAL HIGH (ref 0.44–1.00)
GFR calc Af Amer: 5 mL/min — ABNORMAL LOW (ref >60)
GFR calc non Af Amer: 4 mL/min — ABNORMAL LOW (ref >60)
Glucose, Bld: 108 mg/dL — ABNORMAL HIGH (ref 70–99)
Phosphorus: 8.6 mg/dL — ABNORMAL HIGH (ref 2.5–4.6)
Potassium: 4.5 mmol/L (ref 3.5–5.1)
Sodium: 123 mmol/L — ABNORMAL LOW (ref 135–145)

## 2019-04-01 LAB — CBC
HCT: 27.2 % — ABNORMAL LOW (ref 36.0–46.0)
Hemoglobin: 9.3 g/dL — ABNORMAL LOW (ref 12.0–15.0)
MCH: 28.1 pg (ref 26.0–34.0)
MCHC: 34.2 g/dL (ref 30.0–36.0)
MCV: 82.2 fL (ref 80.0–100.0)
Platelets: 267 10*3/uL (ref 150–400)
RBC: 3.31 MIL/uL — ABNORMAL LOW (ref 3.87–5.11)
RDW: 13.1 % (ref 11.5–15.5)
WBC: 18 10*3/uL — ABNORMAL HIGH (ref 4.0–10.5)
nRBC: 0 % (ref 0.0–0.2)

## 2019-04-01 LAB — GLUCOSE, CAPILLARY
Glucose-Capillary: 100 mg/dL — ABNORMAL HIGH (ref 70–99)
Glucose-Capillary: 103 mg/dL — ABNORMAL HIGH (ref 70–99)
Glucose-Capillary: 59 mg/dL — ABNORMAL LOW (ref 70–99)
Glucose-Capillary: 71 mg/dL (ref 70–99)
Glucose-Capillary: 89 mg/dL (ref 70–99)
Glucose-Capillary: 97 mg/dL (ref 70–99)

## 2019-04-01 MED ORDER — ONDANSETRON HCL 4 MG/2ML IJ SOLN
4.0000 mg | Freq: Four times a day (QID) | INTRAMUSCULAR | Status: DC | PRN
  Administered 2019-04-01 – 2019-04-24 (×8): 4 mg via INTRAVENOUS
  Filled 2019-04-01 (×9): qty 2

## 2019-04-01 MED ORDER — AZITHROMYCIN 500 MG PO TABS
250.0000 mg | ORAL_TABLET | Freq: Every day | ORAL | Status: AC
  Administered 2019-04-02 – 2019-04-05 (×3): 250 mg via ORAL
  Filled 2019-04-01 (×3): qty 1

## 2019-04-01 MED ORDER — CALCIUM ACETATE (PHOS BINDER) 667 MG PO CAPS
1334.0000 mg | ORAL_CAPSULE | Freq: Three times a day (TID) | ORAL | Status: DC
  Administered 2019-04-02 – 2019-04-05 (×4): 1334 mg via ORAL
  Filled 2019-04-01 (×5): qty 2

## 2019-04-01 MED ORDER — AMLODIPINE BESYLATE 5 MG PO TABS
5.0000 mg | ORAL_TABLET | Freq: Every day | ORAL | Status: DC
  Administered 2019-04-01 – 2019-04-02 (×2): 5 mg via ORAL
  Filled 2019-04-01 (×2): qty 1

## 2019-04-01 MED ORDER — ALBUTEROL SULFATE (2.5 MG/3ML) 0.083% IN NEBU
2.5000 mg | INHALATION_SOLUTION | Freq: Four times a day (QID) | RESPIRATORY_TRACT | Status: DC | PRN
  Administered 2019-04-01: 2.5 mg via RESPIRATORY_TRACT
  Filled 2019-04-01: qty 3

## 2019-04-01 MED ORDER — ORAL CARE MOUTH RINSE
15.0000 mL | Freq: Two times a day (BID) | OROMUCOSAL | Status: DC
  Administered 2019-04-01 – 2019-04-24 (×40): 15 mL via OROMUCOSAL

## 2019-04-01 MED ORDER — GLUCOSE 40 % PO GEL
1.0000 | ORAL | Status: AC
  Administered 2019-04-01: 37.5 g via ORAL
  Filled 2019-04-01: qty 1

## 2019-04-01 NOTE — Progress Notes (Signed)
Pt SpO2 90% on 3 L, pt sleeping in no distress. Wheezing auscultated in upper lobes.  Sharon Seller, MD aware, new order for albuterol placed.

## 2019-04-01 NOTE — TOC Initial Note (Signed)
Transition of Care [facility]) - Initial/Assessment Note    Patient Details  Name: Amber Dixon MRN: [id number] Date of Birth: 1950-07-06  Transition of Care [facility]) CM/SW Contact:    Sharin Mons, RN Phone Number: 04/01/2019, 8:30 AM  Clinical Narrative:     Admitted with acute hypoxic respiratory failure, hx of ESRD not on hemodialysis, hypertension, diabetes. Recently admitted November 27 to December 1 for worsening renal failure and anemia. From home with family. Pt without health insurance... pt will probably need assistance with meds @ d/c. With previous admission a f/u appointment was established for 05/14/2018 @ [facility], NCM noted on AVS.  TOC monitoring and following for needs .Marland Kitchen...  Expected Discharge Plan: Home/Self Care Barriers to Discharge: Continued Medical Work up   Patient Goals and CMS Choice        Expected Discharge Plan and Services Expected Discharge Plan: Home/Self Care                                              Prior Living Arrangements/Services                       Activities of Daily Living   ADL Screening (condition at time of admission) Patient's cognitive ability adequate to safely complete daily activities?: Yes Is the patient deaf or have difficulty hearing?: No Does the patient have difficulty seeing, even when wearing glasses/contacts?: No Does the patient have difficulty concentrating, remembering, or making decisions?: No Patient able to express need for assistance with ADLs?: Yes Does the patient have difficulty dressing or bathing?: No Independently performs ADLs?: Yes (appropriate for developmental age) Does the patient have difficulty walking or climbing stairs?: Yes Weakness of Legs: Both Weakness of Arms/Hands: None  Permission Sought/Granted                  Emotional Assessment              Admission diagnosis:  Hyponatremia [E87.1] ESRD (end stage  renal disease) ([place]) [N18.6] Acute respiratory failure with hypoxia ([place]) [J96.01] Language barrier affecting health care [Z78.9] Multifocal pneumonia [J18.9] Acute respiratory failure with hypoxemia ([place]) [J96.01] Patient Active Problem List   Diagnosis Date Noted  . Acute respiratory failure with hypoxemia ([place]) 03/31/2019  . Acute on chronic diastolic (congestive) heart failure ([place]) 03/24/2019  . Hypertension 03/22/2019  . Anemia of renal disease 03/22/2019  . Proteinuria 03/22/2019  . Chest pain 03/22/2019  . Acute on chronic renal failure ([place]) 03/21/2019   PCP:  Patient, No Pcp Per Pharmacy:   [facility] [place], [place] [address] Phone: [phone] Fax: [phone]     Social Determinants of Health (SDOH) Interventions    Readmission Risk Interventions No flowsheet data found.

## 2019-04-01 NOTE — Progress Notes (Signed)
Subjective:   Pt seen at the bedside on rounds this AM. We discussed how kidneys are not working, and her eventual need for dialysis. Pt stated she does not know what dialysis is. The team explained dialysis to the patient. Pt has difficulty understanding that her kidneys are failing, and that without dialysis she will likely die form kidney failure.  She says she doesn't want dialysis because she is not in pain. Does endorse chest pain, SOB and nausea. States the chest pain she was experiencing when she first came to the hospital has improved, but the SOB has worsened.   Pt's son is in the room and asks if she can get medical treatments short of dialysis then be discharged to [place]. We discussed our concern that if she leaves without getting dialysis, she may get very sick again before she gets home. Pt state she would be willing to do dialysis in [place].   We asked the pt her understanding of her condition. Pt states that she started having chest pain. She doesn't understand what is causing her to feel this poorly, even after the explanation. When told it is due to her kidneys she says, "maybe."  Consults: nephrology  Objective:  Vital signs in last 24 hours: Vitals:   03/31/19 2302 04/01/19 0117 04/01/19 0224 04/01/19 0736  BP: (!) 114/57   (!) 148/71  Pulse: 92   96  Resp: 18   18  Temp: 98.7 F (37.1 C)   98.9 F (37.2 C)  TempSrc: Oral     SpO2:  95% 95% 95%   Physical Exam  Constitutional:  Small, elderly woman   Cardiovascular: Regular rhythm.  No murmur heard. Tachycardic; no LE edema  Pulmonary/Chest:  Anterior and posterior fine crackles; increased respiratory effort on 5 L, satting 90%  Abdominal: Soft. She exhibits no distension.  Neurological: She is alert.  Skin: Skin is warm and dry.  Psychiatric:  Poor judgment; poor medical comprehension  Nursing note and vitals reviewed.  I/Os:  Intake/Output Summary (Last 24 hours) at 04/01/2019 1302 Last data filed  at 04/01/2019 0300 Gross per 24 hour  Intake 1054.58 ml  Output -  Net 1054.58 ml   Labs: Results for orders placed or performed during the hospital encounter of 03/31/19 (from the past 24 hour(s))  Blood Culture (routine x 2)     Status: None (Preliminary result)   Collection Time: 03/31/19  1:09 PM   Specimen: BLOOD  Result Value Ref Range   Specimen Description BLOOD LEFT ANTECUBITAL    Special Requests      BOTTLES DRAWN AEROBIC ONLY Blood Culture results may not be optimal due to an inadequate volume of blood received in culture bottles   Culture      NO GROWTH < 24 HOURS Performed at [facility] [address] [place], [place] [address]    Report Status PENDING   Urinalysis, Routine w reflex microscopic     Status: Abnormal   Collection Time: 03/31/19  1:45 PM  Result Value Ref Range   Color, Urine YELLOW YELLOW   APPearance CLOUDY (A) CLEAR   Specific Gravity, Urine 1.014 1.005 - 1.030   pH 6.0 5.0 - 8.0   Glucose, UA 50 (A) NEGATIVE mg/dL   Hgb urine dipstick SMALL (A) NEGATIVE   Bilirubin Urine NEGATIVE NEGATIVE   Ketones, ur 5 (A) NEGATIVE mg/dL   Protein, ur >=300 (A) NEGATIVE mg/dL   Nitrite NEGATIVE NEGATIVE   Leukocytes,Ua NEGATIVE NEGATIVE  RBC / HPF 0-5 0 - 5 RBC/hpf   WBC, UA 0-5 0 - 5 WBC/hpf   Bacteria, UA RARE (A) NONE SEEN   Squamous Epithelial / LPF 0-5 0 - 5  Osmolality, urine     Status: Abnormal   Collection Time: 03/31/19  1:45 PM  Result Value Ref Range   Osmolality, Ur 298 (L) 300 - 900 mOsm/kg  Sodium, urine, random     Status: None   Collection Time: 03/31/19  1:45 PM  Result Value Ref Range   Sodium, Ur 15 mmol/L  Lactic acid, plasma     Status: None   Collection Time: 03/31/19  1:47 PM  Result Value Ref Range   Lactic Acid, Venous 0.8 0.5 - 1.9 mmol/L  Troponin I (High Sensitivity)     Status: None   Collection Time: 03/31/19  1:58 PM  Result Value Ref Range   Troponin I (High Sensitivity) 16 <18 ng/L  Osmolality      Status: None   Collection Time: 03/31/19  9:35 PM  Result Value Ref Range   Osmolality 280 275 - 295 mOsm/kg  Basic metabolic panel     Status: Abnormal   Collection Time: 03/31/19  9:35 PM  Result Value Ref Range   Sodium 122 (L) 135 - 145 mmol/L   Potassium 4.6 3.5 - 5.1 mmol/L   Chloride 92 (L) 98 - 111 mmol/L   CO2 14 (L) 22 - 32 mmol/L   Glucose, Bld 59 (L) 70 - 99 mg/dL   BUN 91 (H) 8 - 23 mg/dL   Creatinine, Ser 9.21 (H) 0.44 - 1.00 mg/dL   Calcium 6.3 (LL) 8.9 - 10.3 mg/dL   GFR calc non Af Amer 4 (L) >60 mL/min   GFR calc Af Amer 5 (L) >60 mL/min   Anion gap 16 (H) 5 - 15  Glucose, capillary     Status: Abnormal   Collection Time: 04/01/19 12:58 AM  Result Value Ref Range   Glucose-Capillary 59 (L) 70 - 99 mg/dL  Glucose, capillary     Status: None   Collection Time: 04/01/19  1:30 AM  Result Value Ref Range   Glucose-Capillary 71 70 - 99 mg/dL  CBC     Status: Abnormal   Collection Time: 04/01/19  2:41 AM  Result Value Ref Range   WBC 18.0 (H) 4.0 - 10.5 K/uL   RBC 3.31 (L) 3.87 - 5.11 MIL/uL   Hemoglobin 9.3 (L) 12.0 - 15.0 g/dL   HCT 27.2 (L) 36.0 - 46.0 %   MCV 82.2 80.0 - 100.0 fL   MCH 28.1 26.0 - 34.0 pg   MCHC 34.2 30.0 - 36.0 g/dL   RDW 13.1 11.5 - 15.5 %   Platelets 267 150 - 400 K/uL   nRBC 0.0 0.0 - 0.2 %  Renal function panel     Status: Abnormal   Collection Time: 04/01/19  2:41 AM  Result Value Ref Range   Sodium 123 (L) 135 - 145 mmol/L   Potassium 4.5 3.5 - 5.1 mmol/L   Chloride 93 (L) 98 - 111 mmol/L   CO2 13 (L) 22 - 32 mmol/L   Glucose, Bld 108 (H) 70 - 99 mg/dL   BUN 90 (H) 8 - 23 mg/dL   Creatinine, Ser 8.84 (H) 0.44 - 1.00 mg/dL   Calcium 6.1 (LL) 8.9 - 10.3 mg/dL   Phosphorus 8.6 (H) 2.5 - 4.6 mg/dL   Albumin 1.8 (L) 3.5 - 5.0 g/dL   GFR  calc non Af Amer 4 (L) >60 mL/min   GFR calc Af Amer 5 (L) >60 mL/min   Anion gap 17 (H) 5 - 15  Glucose, capillary     Status: None   Collection Time: 04/01/19  8:38 AM  Result Value Ref Range    Glucose-Capillary 89 70 - 99 mg/dL   Imaging: DG Chest Portable 1 View  IMPRESSION: Worsening bilateral pneumonia, right greater than left. Followup PA and lateral chest X-ray is recommended in 3-4 weeks following trial of antibiotic therapy to ensure resolution and exclude underlying malignancy.  Assessment/Plan:  Assessment: [name] is a 68 year old Spanish-speaking female with a past medical history of hypertension, type 2 diabetes and recently diagnosed chronic renal failure with recent discharge for acute on chronic renal failure here with renewed chest pain, SOB and new abdominal pain concerning for worsening of her renal failure +/- pneumonia.   Plan: Acute on Chronic Kidney Disease Stage V: -pt with recent admission for SOB and chest pain found to have acute on chronic renal failure who has a difficult social situation as patient is a [place] citizen here visiting family and would not qualify for any sort of emergency insurance if she would require dialysis -Presented yesterday with chest pain and shortness of breath after stopping her outpatient diuretics due to abdominal pain and burning, likely uremic gastritis -Patient started back on diuretics here -Patient with somewhat improved chest pain today but worsening of her shortness of breath with no reported urinary output after receiving fluids in the emergency department  -Plan: -Continue IV diuresis per nephrology -Repeat chest x-ray today given worsening shortness of breath and increased O2 requirement  -If symptoms worsen, patient may require emergent dialysis  Pneumonia: -pt presented with SOB and chest pain with x-ray findings concerning for pneumonia -could be secondary to acute on chronic kidney disease, stage V -COVID negative x2 -on ceftriaxone and azithro -WBC increasing from 11.7 yesterday to 18 today -patient continues to experience chest pain and worsening SOB requiring 4 L [place]  Plan: -repeat chest  x-ray today -follow fever curve -follow WBC -continue antibiotics  Metabolic acidosis: -restart sodium bicarbonate which was started last admission   Hypertension: -BP 148/71 most recently -restart amlodipine 5 which was started last admission -likely will also improve with diuresis  DM2: -SSI  Secondary Hyperparathyroidism: -restart TID calcium carbonate which was started last admission  Dispo: Anticipated discharge pending clinical course.   Al Decant, MD 04/01/2019, 1:02 PM Pager: [phone]

## 2019-04-01 NOTE — Progress Notes (Addendum)
I have personally seen and examined this patient and agree with the assessment/plan as outlined below by Edwena Felty PA Student. She experienced some transient improvement overnight however this morning with worsening shortness of breath and some fleeting atypical chest pain.  Urine output has not been charted and I have ordered for strict input/output.  The plan is to recheck a chest x-ray today to determine whether this is pulmonary edema or an evolving infiltrate.  We will continue to maximize efforts at medical management to reach a clinically stable point where she can travel to [place] for continued care of her chronic kidney disease stage V.  If she needs dialysis, we will definitely begin this and then plan to have her travel to [place] for continuity of care as she will unlikely be able to qualify for maintenance hemodialysis here.  Dariona Postma K.,MD 04/01/2019 11:31 AM   Admit: 03/31/2019 LOS: 1    Subjective:  . History obtained with medical interpretor. [name] was able to sleep last night but states she woke up at 5am with shortness of breath that she states "won't go away" despite being on 4L O2 via nasal cannula. She states her chest pain has gotten better. She notes some vomiting this morning and continued funny taste in her mouth. She denies any fever, chills or itching overnight.   Scheduled Meds: . azithromycin  250 mg Oral Daily  . calcium carbonate  1 tablet Oral TID  . famotidine  20 mg Oral Daily  . furosemide  80 mg Intravenous TID  . heparin  5,000 Units Subcutaneous Q12H  . mouth rinse  15 mL Mouth Rinse BID  . sodium bicarbonate  650 mg Oral TID   Continuous Infusions: . cefTRIAXone (ROCEPHIN)  IV     PRN Meds:.acetaminophen, albuterol, ondansetron  Current Labs:   Recent Labs  Lab 03/31/19 0840 03/31/19 2135 04/01/19 0241  NA 121* 122* 123*  K 4.2 4.6 4.5  CL 89* 92* 93*  CO2 14* 14* 13*  GLUCOSE 61* 59* 108*  BUN 93* 91* 90*  CREATININE 9.97*  9.21* 8.84*  CALCIUM 6.8* 6.3* 6.1*  PHOS  --   --  8.6*   Recent Labs  Lab 03/31/19 0840 04/01/19 0241  WBC 11.7* 18.0*  HGB 12.5 9.3*  HCT 36.1 27.2*  MCV 81.5 82.2  PLT 348 267    Physical Exam:  Blood pressure (!) 148/71, pulse 96, temperature 98.9 F (37.2 C), resp. rate 18, SpO2 95 %. Physical Exam  Constitutional:  Elderly female, lying in bed, appearing ill but in no acute distress  Cardiovascular: Normal rate, regular rhythm and normal heart sounds.  Respiratory: Effort normal.  Crackles heard anteriorly  GI: Soft. Bowel sounds are normal.  Musculoskeletal:        General: No edema.  Skin: Skin is warm and dry.  Psychiatric: She has a normal mood and affect.    Assessment and Plan 1. Acute on chronic kidney disease, Stage V. Patient did not take medications prescribed from discharge. BUN 90, Creatinine 8.84. Urinalysis was positive for significant protein (+300).  Uremic symptoms include fatigue, vomiting, and dysgeusia. Urine output has not been recorded. The goal is for patient to be followed by nephrology in [place], son has asked to receive medical flight letter to reschedule her flight to [place] sooner since she is here on a travel visa. Not optimal, but if she does not improve we may start dialysis inpatient here with plan to continue inpatient in [place]. -  Continue to volume unload with Furosemide 80mg  BID, Sodium Bicarbonate 650mg  BID - Manage uremic symptoms with Pepcid, zofran  2. Metabolic Acidosis with anion gap, secondary to acute kidney injury. CO2 level 13, anion gap of 17. Minimally improved from yesterday evening with sodium bicarb   3. Hyponatremia, likely secondary to impaired free water excretion, worsened by pulmonary process. Should continue to improve with lasix and sodium bicarb   4. Secondary hyperparathyroidism, due to renal failure and hyperphosphatemia. Hypocalcemia corrected value of 7.9. Phosphorus 8.6. - Calcium carbonate 500mg  TID -  Calcium acetate with meals  5. Anemia of chronic disease. Today is within normal limits at 12.5.   6. Abnormal findings on chest xray. Concerning for infectious pneumonia v pulmonary edema. Today she has leukocytosis increased from 11.7 yesterday to 18 which is supporting for infectious cause. Crackles heard to subscapula yesterday, however heard anteriorly today. Recommend repeat CXR - Antibiotics Vancomycin, cefepime, azithromycin  7. Diabetes Mellitis, Type II.  - Insulin regimen, with SSI  8. Hypertension. Patient and son advised not to start captopril - Amlodipine, should improve with volume unloading from lasix as well     Edwena Felty PA student 04/01/2019, 9:13 AM

## 2019-04-01 NOTE — Progress Notes (Signed)
Pt resting, seems comfortable, denied pain at this time.

## 2019-04-01 NOTE — Progress Notes (Signed)
Relayed critical calcium value of 6.1 to interim physician. Physician stated that it looked lower than it is because albumin is also low. Physician stated that based off of this that the actual calcium level is about 7.9 as opposed to 6.1. Will continue to monitor patient.

## 2019-04-01 NOTE — Progress Notes (Signed)
Hypoglycemic Event  CBG: 59  Treatment: 1 tube glucose gel  Symptoms: None  Follow-up CBG: Time:0130 CBG Result:71  Possible Reasons for Event: Inadequate meal intake  Comments/MD notified:Seawell,MD notified    Kennith Morss N Hobert [place]

## 2019-04-01 NOTE — Progress Notes (Signed)
Pt reported feeling hot, SOB, and pressure on the left side of her chest. A 12 lead was done, and the physician was contacted. I did bump her to 4 L and she is now sating at 94%. Physician did not give orders for any meds or interventions. They stated that the day team would come see her first. Will continue to monitor.

## 2019-04-01 NOTE — Progress Notes (Signed)
Patient having multiple bowel movements this afternoon to evening. Patient tells me this is new as of today. Family Medicine made aware. Waiting to see if any new orders will be placed.

## 2019-04-02 ENCOUNTER — Inpatient Hospital Stay (HOSPITAL_COMMUNITY): Payer: Self-pay

## 2019-04-02 ENCOUNTER — Encounter (HOSPITAL_COMMUNITY): Payer: Self-pay | Admitting: Interventional Radiology

## 2019-04-02 DIAGNOSIS — N185 Chronic kidney disease, stage 5: Secondary | ICD-10-CM

## 2019-04-02 DIAGNOSIS — E1122 Type 2 diabetes mellitus with diabetic chronic kidney disease: Secondary | ICD-10-CM

## 2019-04-02 DIAGNOSIS — Z79899 Other long term (current) drug therapy: Secondary | ICD-10-CM

## 2019-04-02 DIAGNOSIS — I120 Hypertensive chronic kidney disease with stage 5 chronic kidney disease or end stage renal disease: Secondary | ICD-10-CM

## 2019-04-02 DIAGNOSIS — J811 Chronic pulmonary edema: Secondary | ICD-10-CM

## 2019-04-02 DIAGNOSIS — E872 Acidosis: Secondary | ICD-10-CM

## 2019-04-02 DIAGNOSIS — N179 Acute kidney failure, unspecified: Secondary | ICD-10-CM

## 2019-04-02 DIAGNOSIS — J9601 Acute respiratory failure with hypoxia: Secondary | ICD-10-CM

## 2019-04-02 DIAGNOSIS — N2581 Secondary hyperparathyroidism of renal origin: Secondary | ICD-10-CM

## 2019-04-02 HISTORY — PX: IR US GUIDE VASC ACCESS RIGHT: IMG2390

## 2019-04-02 HISTORY — PX: IR FLUORO GUIDE CV LINE RIGHT: IMG2283

## 2019-04-02 LAB — CALCIUM, IONIZED: Calcium, Ionized, Serum: 3.3 mg/dL — ABNORMAL LOW (ref 4.5–5.6)

## 2019-04-02 LAB — RENAL FUNCTION PANEL
Albumin: 2.2 g/dL — ABNORMAL LOW (ref 3.5–5.0)
Anion gap: 23 — ABNORMAL HIGH (ref 5–15)
BUN: 95 mg/dL — ABNORMAL HIGH (ref 8–23)
CO2: 11 mmol/L — ABNORMAL LOW (ref 22–32)
Calcium: 6.6 mg/dL — ABNORMAL LOW (ref 8.9–10.3)
Chloride: 93 mmol/L — ABNORMAL LOW (ref 98–111)
Creatinine, Ser: 9.46 mg/dL — ABNORMAL HIGH (ref 0.44–1.00)
GFR calc Af Amer: 4 mL/min — ABNORMAL LOW (ref >60)
GFR calc non Af Amer: 4 mL/min — ABNORMAL LOW (ref >60)
Glucose, Bld: 95 mg/dL (ref 70–99)
Phosphorus: 10.2 mg/dL — ABNORMAL HIGH (ref 2.5–4.6)
Potassium: 3.9 mmol/L (ref 3.5–5.1)
Sodium: 127 mmol/L — ABNORMAL LOW (ref 135–145)

## 2019-04-02 LAB — CBC
HCT: 31.3 % — ABNORMAL LOW (ref 36.0–46.0)
Hemoglobin: 10.4 g/dL — ABNORMAL LOW (ref 12.0–15.0)
MCH: 27.9 pg (ref 26.0–34.0)
MCHC: 33.2 g/dL (ref 30.0–36.0)
MCV: 83.9 fL (ref 80.0–100.0)
Platelets: 293 10*3/uL (ref 150–400)
RBC: 3.73 MIL/uL — ABNORMAL LOW (ref 3.87–5.11)
RDW: 13.7 % (ref 11.5–15.5)
WBC: 22 10*3/uL — ABNORMAL HIGH (ref 4.0–10.5)
nRBC: 0 % (ref 0.0–0.2)

## 2019-04-02 LAB — GLUCOSE, CAPILLARY
Glucose-Capillary: 69 mg/dL — ABNORMAL LOW (ref 70–99)
Glucose-Capillary: 89 mg/dL (ref 70–99)
Glucose-Capillary: 152 mg/dL — ABNORMAL HIGH (ref 70–99)
Glucose-Capillary: 92 mg/dL (ref 70–99)
Glucose-Capillary: 109 mg/dL — ABNORMAL HIGH (ref 70–99)
Glucose-Capillary: 94 mg/dL (ref 70–99)

## 2019-04-02 MED ORDER — HEPARIN SODIUM (PORCINE) 1000 UNIT/ML IJ SOLN
INTRAMUSCULAR | Status: AC
  Filled 2019-04-02: qty 1

## 2019-04-02 MED ORDER — LIDOCAINE HCL (PF) 1 % IJ SOLN: 5.0000 mL | INTRAMUSCULAR | Status: DC | PRN

## 2019-04-02 MED ORDER — PENTAFLUOROPROP-TETRAFLUOROETH EX AERO: 1.0000 "application " | INHALATION_SPRAY | CUTANEOUS | Status: DC | PRN

## 2019-04-02 MED ORDER — SODIUM CHLORIDE 0.9 % IV SOLN
1.0000 g | INTRAVENOUS | Status: AC
  Administered 2019-04-02 – 2019-04-06 (×5): 1 g via INTRAVENOUS
  Filled 2019-04-02 (×2): qty 10
  Filled 2019-04-02: qty 1
  Filled 2019-04-02 (×2): qty 10

## 2019-04-02 MED ORDER — DEXMEDETOMIDINE HCL IN NACL 400 MCG/100ML IV SOLN
0.0000 ug/kg/h | INTRAVENOUS | Status: DC
  Administered 2019-04-02: 0.2 ug/kg/h via INTRAVENOUS

## 2019-04-02 MED ORDER — FUROSEMIDE 10 MG/ML IJ SOLN
120.0000 mg | Freq: Once | INTRAVENOUS | Status: DC
  Filled 2019-04-02: qty 12

## 2019-04-02 MED ORDER — HEPARIN SODIUM (PORCINE) 1000 UNIT/ML IJ SOLN: 1000.0000 [IU] | INTRAMUSCULAR | Status: DC | PRN

## 2019-04-02 MED ORDER — HEPARIN SODIUM (PORCINE) 1000 UNIT/ML IJ SOLN
INTRAMUSCULAR | Status: AC
  Filled 2019-04-02: qty 3

## 2019-04-02 MED ORDER — HEPARIN SODIUM (PORCINE) 1000 UNIT/ML DIALYSIS: 1000.0000 [IU] | INTRAMUSCULAR | Status: DC | PRN

## 2019-04-02 MED ORDER — CHLORHEXIDINE GLUCONATE CLOTH 2 % EX PADS
6.0000 | MEDICATED_PAD | Freq: Every day | CUTANEOUS | Status: DC
  Administered 2019-04-04 – 2019-04-25 (×17): 6 via TOPICAL

## 2019-04-02 MED ORDER — LIDOCAINE HCL 1 % IJ SOLN
INTRAMUSCULAR | Status: AC | PRN
  Administered 2019-04-02: 10 mL

## 2019-04-02 MED ORDER — SODIUM CHLORIDE 0.9 % IV SOLN: 100.0000 mL | INTRAVENOUS | Status: DC | PRN

## 2019-04-02 MED ORDER — LIDOCAINE-PRILOCAINE 2.5-2.5 % EX CREA: 1.0000 "application " | TOPICAL_CREAM | CUTANEOUS | Status: DC | PRN

## 2019-04-02 MED ORDER — LIDOCAINE HCL 1 % IJ SOLN
INTRAMUSCULAR | Status: AC
  Filled 2019-04-02: qty 20

## 2019-04-02 MED ORDER — HEPARIN SODIUM (PORCINE) 1000 UNIT/ML DIALYSIS: 40.0000 [IU]/kg | INTRAMUSCULAR | Status: DC | PRN

## 2019-04-02 MED ORDER — HEPARIN SODIUM (PORCINE) 1000 UNIT/ML IJ SOLN
INTRAMUSCULAR | Status: AC
  Administered 2019-04-03: 2600 [IU] via INTRAVENOUS
  Filled 2019-04-02: qty 3

## 2019-04-02 MED ORDER — ALBUMIN HUMAN 25 % IV SOLN
INTRAVENOUS | Status: AC
  Filled 2019-04-02: qty 100

## 2019-04-02 MED ORDER — ALBUMIN HUMAN 25 % IV SOLN: 25.0000 g | Freq: Once | INTRAVENOUS | Status: DC

## 2019-04-02 MED ORDER — ALTEPLASE 2 MG IJ SOLR: 2.0000 mg | Freq: Once | INTRAMUSCULAR | Status: DC | PRN

## 2019-04-02 NOTE — Progress Notes (Addendum)
I have personally seen and examined this patient and agree with the assessment/plan as outlined below by Edwena Felty, [employer] student.  Patient with acute kidney injury on chronic kidney disease stage V/progression to end-stage renal disease.  Overnight has had some worsening of shortness of breath that she appears to downplay.  She reports improved urine output however has had increasing oxygenation requirements reflective of worsening pulmonary edema in spite of intravenous diuretics.  She has rising leukocytosis which indeed may be reflective of concomitant healthcare associated pneumonia for which she is on antibiotics (coverage may possibly be needed to be expanded).  Problem #1.  Volume overload/ESRD-begin hemodialysis today as she has been refractory to efforts at diuretic management.  This dialysis is being initiated as a temporizing measure to get her feeling better to allow travel to [place].  Unfortunately, we cannot have a tunneled hemodialysis catheter placed in her because of her leukocytosis and inability to lay flat at this time.  The dialysis catheter will need to be removed prior to her travel back to [place]. Problem #2: Metabolic acidosis-secondary to progressive renal insufficiency and without improvement on oral sodium bicarbonate.  Monitor with hemodialysis. Problem #3: Hyponatremia-secondary to worsening renal insufficiency and impaired free water excretion.  Monitor with hemodialysis to avoid overcorrection. Problem #4: Anemia of chronic disease-hemoglobin/hematocrit currently within goal.  The patient and her son Clifton James were informed of the above plan and all their questions answered to their satisfaction using assistance of a translator-Graciela.  Oluwatoni Rotunno K.,MD 04/02/2019 11:46 AM   Admit: 03/31/2019 LOS: 2    Subjective:  . History obtained with medical interpretor. Ms. [name] was was placed on 15L O2 via nasal canula earlier this morning, however she is at 89% sat partly  because she is not consistently breathing through her nose with deep breaths and keeps sliding down in the bed. She states she slides down to use the bedpan because she has to urinate often. Her chest pain is minimal in the same area as before. She notes a new dry cough that she feels should produce phlegm and some vomiting this morning.    Scheduled Meds: . amLODipine  5 mg Oral Daily  . azithromycin  250 mg Oral Daily  . calcium acetate  1,334 mg Oral TID WC  . famotidine  20 mg Oral Daily  . furosemide  80 mg Intravenous TID  . heparin  5,000 Units Subcutaneous Q12H  . mouth rinse  15 mL Mouth Rinse BID  . sodium bicarbonate  650 mg Oral TID   Continuous Infusions: . cefTRIAXone (ROCEPHIN)  IV 1 g (04/02/19 0913)   PRN Meds:.acetaminophen, albuterol, ondansetron (ZOFRAN) IV  Current Labs:   Recent Labs  Lab 03/31/19 2135 04/01/19 0241 04/02/19 0244  NA 122* 123* 127*  K 4.6 4.5 3.9  CL 92* 93* 93*  CO2 14* 13* 11*  GLUCOSE 59* 108* 95  BUN 91* 90* 95*  CREATININE 9.21* 8.84* 9.46*  CALCIUM 6.3* 6.1* 6.6*  PHOS  --  8.6* 10.2*   Recent Labs  Lab 03/31/19 0840 04/01/19 0241 04/02/19 0244  WBC 11.7* 18.0* 22.0*  HGB 12.5 9.3* 10.4*  HCT 36.1 27.2* 31.3*  MCV 81.5 82.2 83.9  PLT 348 267 293    Physical Exam:  Blood pressure 128/65, pulse 92, temperature (!) 97.5 F (36.4 C), temperature source Oral, resp. rate 20, SpO2 91 %. Physical Exam  Constitutional:  Elderly female, lying in bed, appearing ill but in no acute distress  Cardiovascular: Normal  rate, regular rhythm and normal heart sounds.  Respiratory:  Increased effort of breathing. Crackles heard anteriorly  GI: Soft. Bowel sounds are normal.  Musculoskeletal:        General: No edema.  Skin: Skin is warm and dry.  Psychiatric: She has a normal mood and affect.    Assessment and Plan 1. Acute on chronic kidney disease, Stage V. Urine output has not been recorded. The goal was for patient to be  followed by nephrology in [place], son has asked to receive medical flight letter to reschedule her flight to [place] sooner since she is here on a travel visa. At this time, renal function has not improved with medical management and patient exhibits continued worsening metabolic acidosis, volume overload on CXR and uremia. - Proceed with dialysis today once IR has placed catheter - Manage uremic symptoms with Pepcid, zofran  2. Metabolic Acidosis with anion gap, secondary to acute kidney injury. CO2 level 11, anion gap of 23. Should improve with dialysis  3. Hyponatremia, likely secondary to impaired free water excretion, worsened by pulmonary process. Has improved from 123 to 127.   4. Secondary hyperparathyroidism, due to renal failure and hyperphosphatemia. Hypocalcemia improved slightly with corrected value of 8.0 with addition of calcium acetate - Calcium carbonate 500mg  TID - Calcium acetate with meals  5. Anemia of chronic disease. Today is within normal limits at 12.5.   6. Abnormal findings on chest xray. Repeat CXR shows significant changes bilaterally. Concerning for infectious pneumonia v atypical pulmonary edema.  Leukocytosis continued from 18-22. - Antibiotics Vancomycin, cefepime, azithromycin  7. Diabetes Mellitis, Type II.  - Insulin regimen, with SSI  8. Hypertension. Patient and son advised not to start captopril - Amlodipine, should improve with volume unloading from lasix as well     Edwena Felty PA student 04/02/2019, 10:08 AM

## 2019-04-02 NOTE — Progress Notes (Signed)
Subjective:   Patient was seen at bedside today and appeared dyspneic and in respiratory distress. She was satting 71% on 6L and we called respiratory to place patient on HF oxygen. She said her SOB was stable and she felt fine. She continues to show little insight into her condition. She reports multiple liquid BMs and lots of urine output. She denies abdominal pain.  Consults: nephrology  Objective:  Vital signs in last 24 hours: Vitals:   04/01/19 2152 04/01/19 2153 04/02/19 0800 04/02/19 1038  BP: 128/65   (!) 119/55  Pulse: 92   92  Resp: 20   (!) 30  Temp: (!) 97.5 F (36.4 C)   97.6 F (36.4 C)  TempSrc: Oral  Oral   SpO2: (!) 88% 91%  (!) 88%   Physical Exam  Constitutional: She appears distressed.  Small, elderly woman   Cardiovascular: Regular rhythm.  No murmur heard. Tachycardic; no LE edema  Pulmonary/Chest: She is in respiratory distress.  Anterior and posterior fine crackles; increased respiratory effort on on 6 L, satting 71%  Abdominal: Soft. She exhibits no distension.  Neurological: She is alert.  Skin: Skin is warm and dry.  Psychiatric:  Poor judgment; poor medical comprehension  Nursing note and vitals reviewed.  I/Os: No intake or output data in the 24 hours ending 04/02/19 1222 Labs: Results for orders placed or performed during the hospital encounter of 03/31/19 (from the past 24 hour(s))  Glucose, capillary     Status: Abnormal   Collection Time: 04/01/19 12:47 PM  Result Value Ref Range   Glucose-Capillary 100 (H) 70 - 99 mg/dL  Glucose, capillary     Status: None   Collection Time: 04/01/19  5:12 PM  Result Value Ref Range   Glucose-Capillary 97 70 - 99 mg/dL  Glucose, capillary     Status: Abnormal   Collection Time: 04/01/19  8:42 PM  Result Value Ref Range   Glucose-Capillary 103 (H) 70 - 99 mg/dL  CBC     Status: Abnormal   Collection Time: 04/02/19  2:44 AM  Result Value Ref Range   WBC 22.0 (H) 4.0 - 10.5 K/uL   RBC 3.73 (L)  3.87 - 5.11 MIL/uL   Hemoglobin 10.4 (L) 12.0 - 15.0 g/dL   HCT 31.3 (L) 36.0 - 46.0 %   MCV 83.9 80.0 - 100.0 fL   MCH 27.9 26.0 - 34.0 pg   MCHC 33.2 30.0 - 36.0 g/dL   RDW 13.7 11.5 - 15.5 %   Platelets 293 150 - 400 K/uL   nRBC 0.0 0.0 - 0.2 %  Renal function panel     Status: Abnormal   Collection Time: 04/02/19  2:44 AM  Result Value Ref Range   Sodium 127 (L) 135 - 145 mmol/L   Potassium 3.9 3.5 - 5.1 mmol/L   Chloride 93 (L) 98 - 111 mmol/L   CO2 11 (L) 22 - 32 mmol/L   Glucose, Bld 95 70 - 99 mg/dL   BUN 95 (H) 8 - 23 mg/dL   Creatinine, Ser 9.46 (H) 0.44 - 1.00 mg/dL   Calcium 6.6 (L) 8.9 - 10.3 mg/dL   Phosphorus 10.2 (H) 2.5 - 4.6 mg/dL   Albumin 2.2 (L) 3.5 - 5.0 g/dL   GFR calc non Af Amer 4 (L) >60 mL/min   GFR calc Af Amer 4 (L) >60 mL/min   Anion gap 23 (H) 5 - 15  Glucose, capillary     Status: Abnormal  Collection Time: 04/02/19 12:14 PM  Result Value Ref Range   Glucose-Capillary 152 (H) 70 - 99 mg/dL   Imaging: DG Chest Portable 1 View  IMPRESSION: Worsening bilateral pneumonia, right greater than left. Followup PA and lateral chest X-ray is recommended in 3-4 weeks following trial of antibiotic therapy to ensure resolution and exclude underlying malignancy.  Assessment/Plan:  Assessment: [name] is a 68 year old Spanish-speaking female with a past medical history of hypertension, type 2 diabetes and recently diagnosed chronic renal failure with recent discharge for acute on chronic renal failure here with renewed chest pain, SOB and new abdominal pain concerning for worsening of her renal failure +/- pneumonia.   Plan: Acute on Chronic Kidney Disease Stage V: -pt with recent admission for SOB and chest pain found to have acute on chronic renal failure who has a difficult social situation as patient is a [place] citizen here visiting family and would not qualify for any sort of emergency insurance if she would require dialysis -Presented  with chest pain and shortness of breath after stopping her outpatient diuretics due to abdominal pain and burning, likely uremic gastritis -Patient started back on diuretics here with unclear urine output -Patient with somewhat improved chest pain but clinically worsening shortness of breath -patient satting 71% on 6L on exam today -nephro following  -Plan: -dialysis as orchestrated by nephrology  Pneumonia: -pt presented with SOB and chest pain with x-ray findings concerning for pneumonia -could be secondary to acute on chronic kidney disease, stage V -COVID negative x2 -on ceftriaxone and azithro -WBC increased to 20 today -patient continues to experience chest pain and worsening SOB requiring high flow oxygen -could be due to her respiratory distress  Plan: -follow fever curve -follow WBC -continue antibiotics  Metabolic acidosis: -continue sodium bicarbonate which was started last admission   Hypertension: -BP 119/55 most recently -on amlodipine 5 which was started last admission  DM2: -SSI  Secondary Hyperparathyroidism: -TID calcium carbonate which was started last admission  Dispo: Anticipated discharge pending clinical course.   Al Decant, MD 04/02/2019, 12:22 PM Pager: [phone]

## 2019-04-02 NOTE — Significant Event (Signed)
Rapid Response Event Note  Overview: Respiratory Distress - Hypoxia   Initial Focused Assessment:  Called by nursing staff with concerns of respiratory distress and ongoing hypoxia after HD. Saturations were in th 9s on NRB 15L. We quickly placed the BIPAP with RT and paged IMTS team. Coarse crackles throughout. SBP 150s.   Interventions: -- BIPAP -- Lasix 120 mg IV  Plan of Care: -- IMTS MDs came to bedside and PCCM was consulted.  -- Night RRRN at bedside to take over   Event Summary:  Start Time [place], [place]

## 2019-04-02 NOTE — Consult Note (Signed)
NAME:  Amber Dixon, MRN:  [id number], DOB:  10-25-1950, LOS: 2 ADMISSION DATE:  03/31/2019, CONSULTATION DATE:  04/02/2019 REFERRING MD:  Dr. Koleen Distance, CHIEF COMPLAINT:  Respiratory Distress   History of present illness   68 year old female with presents to ED on 12/7 with chest pain and shortness of breath. Recent admission 11/27-12/1 for worsening renal failure and anemia. Planned to return to [place] for initiation of dialysis. After leaving hospital patient felt better, however a couple of days later began to experience progressive shortness breath. CXR with worsening bilateral patchy consolidations. Started on Rocephin/Azithromycin for possible CAP. Nephrology consulted. Planned to attempted to diuresis. However on 12/9 required Vascath Placement. Underwent HD 12/9 however no UF pulled. Later in the evening went into respiratory distress requiring BiPAP. PCCM consulted.   Plans to move patient to ICU. Nephrology contacted who agreed to dialyze patient tonight.   Past Medical History  DM, HTN, CKD Stage V  Significant Hospital Events   12/7 > Presents to ED  12/9 > Transferred to ICU for Respiratory Distress   Consults:  Nephrology  PCCM  Procedures:  Bing [place]ighbors 12/9 >>  Significant Diagnostic Tests:  CXR 12/7 > Worsening bilateral pneumonia, right greater than left. Followup PA and lateral chest X-ray is recommended in 3-4 weeks following trial of antibiotic therapy to ensure resolution and exclude underlying Malignancy. CXR 12/8 > The heart is enlarged. There are patchy infiltrates throughout the lungs bilaterally. This is a significant change compared to the most recent comparison. The distribution of the opacities is somewhat heterogeneous, favoring infectious process over pulmonary edema but the rapid course suggest atypical pulmonary edema remains a consideration. There are bilateral pleural effusions.  Micro Data:  Blood 12/7 >>   Antimicrobials:   Rocephin 12/7 >> Azithromycin 12/7 >>    Interim history/subjective:    Objective   Blood pressure 136/82, pulse (!) 101, temperature (!) 97.4 F (36.3 C), temperature source Oral, resp. rate (!) 21, weight 35.5 kg, SpO2 98 %.        Intake/Output Summary (Last 24 hours) at 04/02/2019 2038 Last data filed at 04/02/2019 1805 Gross per 24 hour  Intake -  Output 0 ml  Net 0 ml   Filed Weights   04/02/19 1515 04/02/19 1805  Weight: 35.5 kg 35.5 kg    Examination: General: Thin adult female  HENT: BiPAP in place  Lungs: Diffuse Crackles, Tachypnea Cardiovascular: RRR, no MRG Abdomen: soft, non-tender, active bowel sounds  Extremities: +1 BLE Neuro: Alert, non-english speaking GU: intact   Resolved Hospital Problem list     Assessment & Plan:   Acute Hypoxic Respiratory Distress in setting of pulmonary edema/Volume Overload   Plan -Continue BiPAP, Titrate supplemental oxygen to maintain saturation >92  -Pulmonary Hygiene  -Dialyze as below   ESRD > Volume Overload  Hyponatremia  Metabolic Acidosis >> Improved with oral bicarb  Plan -Nephrology Following -Plans for HD tonight with removal of 1L -Trend BMP -Avoid Nephrotoxic Agents  -Replace electrolytes as indicated   Hypoglycemia  DM Plan -Trend Glucose  -Dextrose PRN  Anemia of Chronic Diease  Plan -Trend CBC  -Transfuse for hemoglobin <7 -Heparin sq for DVT ppx  Leukocytosis, Concern for CAP Plan -Trend WBC and Fever Curve -Follow Culture Data -Continue Rocephin/Azithromycin     Best practice:  Diet: Renal  DVT prophylaxis: Heparin SQ  GI prophylaxis: Pepcid  Glucose control: Trend Glucose  Mobility: Bedrest  Code Status: FC Family Communication: Son updated at  bedside  Disposition: Transfer to ICU   Labs   CBC: Recent Labs  Lab 03/31/19 0840 04/01/19 0241 04/02/19 0244  WBC 11.7* 18.0* 22.0*  HGB 12.5 9.3* 10.4*  HCT 36.1 27.2* 31.3*  MCV 81.5 82.2 83.9  PLT 348 267 293     Basic Metabolic Panel: Recent Labs  Lab 03/31/19 0840 03/31/19 2135 04/01/19 0241 04/02/19 0244  NA 121* 122* 123* 127*  K 4.2 4.6 4.5 3.9  CL 89* 92* 93* 93*  CO2 14* 14* 13* 11*  GLUCOSE 61* 59* 108* 95  BUN 93* 91* 90* 95*  CREATININE 9.97* 9.21* 8.84* 9.46*  CALCIUM 6.8* 6.3* 6.1* 6.6*  PHOS  --   --  8.6* 10.2*   GFR: CrCl cannot be calculated (Unknown ideal weight.). Recent Labs  Lab 03/31/19 0840 03/31/19 0902 03/31/19 1347 04/01/19 0241 04/02/19 0244  PROCALCITON  --  1.68  --   --   --   WBC 11.7*  --   --  18.0* 22.0*  LATICACIDVEN  --  1.9 0.8  --   --     Liver Function Tests: Recent Labs  Lab 03/31/19 0840 04/01/19 0241 04/02/19 0244  AST 28  --   --   ALT 13  --   --   ALKPHOS 149*  --   --   BILITOT 0.8  --   --   PROT 6.1*  --   --   ALBUMIN 2.5* 1.8* 2.2*   Recent Labs  Lab 03/31/19 0840  LIPASE 38   No results for input(s): AMMONIA in the last 168 hours.  ABG    Component Value Date/Time   TCO2 19 (L) 03/21/2019 1752     Coagulation Profile: No results for input(s): INR, PROTIME in the last 168 hours.  Cardiac Enzymes: No results for input(s): CKTOTAL, CKMB, CKMBINDEX, TROPONINI in the last 168 hours.  HbA1C: Hgb A1c MFr Bld  Date/Time Value Ref Range Status  03/22/2019 06:08 AM 5.9 (H) 4.8 - 5.6 % Final    Comment:    (NOTE) Pre diabetes:          5.7%-6.4% Diabetes:              >6.4% Glycemic control for   <7.0% adults with diabetes     CBG: Recent Labs  Lab 04/01/19 2042 04/02/19 0824 04/02/19 0920 04/02/19 1214 04/02/19 1838  GLUCAP 103* 69* 89 152* 92    Review of Systems:   Unable to review   Past Medical History  She,  has a past medical history of Diabetes mellitus without complication (Angelina) and Hypertension.   Surgical History    Past Surgical History:  Procedure Laterality Date  . IR FLUORO GUIDE CV LINE RIGHT  04/02/2019  . IR US GUIDE VASC ACCESS RIGHT  04/02/2019  . LEG SURGERY        Social History   reports that she has never smoked. She has never used smokeless tobacco.   Family History   Her family history is not on file.   Allergies No Known Allergies   Home Medications  Prior to Admission medications   Medication Sig Start Date End Date Taking? Authorizing Provider  amLODipine (NORVASC) 5 MG tablet Take 1 tablet (5 mg total) by mouth daily. 03/26/19  Yes Al Decant, MD  calcium carbonate (TUMS) 500 MG chewable tablet Chew 1 tablet (200 mg of elemental calcium total) by mouth 3 (three) times daily. 03/25/19 03/24/20 Yes Al Decant, MD  furosemide (LASIX) 80 MG tablet Take 1 tablet (80 mg total) by mouth 2 (two) times daily. 03/25/19 04/24/19 Yes Al Decant, MD  PRESCRIPTION MEDICATION Inject 6 Units into the skin daily. Medication:Insulin Vial   Yes [provider]  sodium bicarbonate 650 MG tablet Take 1 tablet (650 mg total) by mouth 2 (two) times daily. 03/25/19 04/24/19 Yes Al Decant, MD     Critical care time: [address], AGACNP-BC [facility]  Pgr: [phone]  PCCM Pgr: [phone]

## 2019-04-02 NOTE — Procedures (Signed)
Pre-procedure Diagnosis: ESRD Post-procedure Diagnosis: Same  Successful placement of a non-tunneled HD catheter with tips terminating within the superior aspect of the right atrium.    Complications: None Immediate  EBL: Minimal   The catheter is ready for immediate use.   Jay Belkys Henault, MD Pager #: [phone]   

## 2019-04-02 NOTE — Significant Event (Signed)
Rapid Response Event Note  Overview: RRT to IR for Hypoxia - Saturations 30s  Initial Focused Assessment: Receive a call from IR staff with concerns of patient having hypoxia and respiratory distress. Upon arrival, patient's oxygen saturations were 86% on NRB 15L, after about minutes, saturations improved to 92%. HR 90s. Staff proceeded with the procedure and patient was transferred back to 2W. Lung sounds - diminished throughout, coarse crackles. Per staff her color was gray earlier but since than has improved. Saturations dropped off again while being transferred into 2W, it took several minutes and then saturations were 95% on NRB 15L. I paged IMTS MDs, updated them, and they came to the bedside. RR 26 - 30 at times, appears moderately SOB and fatigued.  Interventions: --No RRT Interventions   Plan of Care: -- Patient to get urgent HD -- Might need BIPAP support with WOB/SOB or respiratory effort do not improve.  -- Respiratory status is tenuous.    Event Summary:  Start Time 1350 Arrival Time 1351 End Time  1445  Amber Dixon R

## 2019-04-02 NOTE — Progress Notes (Signed)
Notified of patient desaturating in interventional radiology, arrived to IR with rapid response already there and had pt on nonrebreather with oxygen level in low to mid 90's.  Pt able to proceed with procedure and dialysis notified of patient having new access port.  Pt returned to 2w room 1, dialysis consents obtained with discussion of patient, her son, physicians.  Pt transported to dialysis on non rebreather with oxygen level at 92% on arrival.

## 2019-04-02 NOTE — Progress Notes (Signed)
   Patient Status: [facility] - In-pt  Assessment and Plan: ESRD with plans to begin HD. Plan for image-guided nontunneled HD catheter placement tentatively for today in IR.  Risks and benefits discussed with the patient including, but not limited to bleeding, infection, vascular injury, pneumothorax which may require chest tube placement, air embolism or even death. All of the patient's questions were answered, patient is agreeable to proceed. Consent signed and in IR control room.  ______________________________________________________________________   History of Present Illness: Amber Dixon is a 68 y.o. female with a past medical history of hypertension, CKD, and diabetes mellitus. She presented to [facility] ED 03/31/2019 with complaints of headache and emesis. She was found to be in fluid overload with acute respiratory failure. She was admitted for further management. Nephrology was consulted who recommended initiation of HD and IR consultation for possible image-guided nontunneled HD catheter placement.  Patient awake and alert laying in bed. Accompanied by son at bedside. Complains of dyspnea, stable since admission.  Patient and son are Spanish-speaking only. Dorena Cookey was used as Veterinary surgeon throughout today's interaction.   Allergies and medications reviewed.   Vital Signs: BP (!) 119/55   Pulse 92   Temp 97.6 F (36.4 C)   Resp (!) 30   SpO2 (!) 88%   Physical Exam Vitals signs and nursing note reviewed.  Constitutional:      General: She is not in acute distress.    Appearance: Normal appearance.  Pulmonary:     Effort: Pulmonary effort is normal. No respiratory distress.  Skin:    General: Skin is warm and dry.  Neurological:     Mental Status: She is alert and oriented to person, place, and time.      Imaging reviewed.   Labs:  COAGS: Recent Labs    03/21/19 1617  INR 1.1    BMP: Recent Labs    03/31/19 0840 03/31/19 2135  04/01/19 0241 04/02/19 0244  NA 121* 122* 123* 127*  K 4.2 4.6 4.5 3.9  CL 89* 92* 93* 93*  CO2 14* 14* 13* 11*  GLUCOSE 61* 59* 108* 95  BUN 93* 91* 90* 95*  CALCIUM 6.8* 6.3* 6.1* 6.6*  CREATININE 9.97* 9.21* 8.84* 9.46*  GFRNONAA 4* 4* 4* 4*  GFRAA 4* 5* 5* 4*       Electronically Signed: Earley Abide, PA-C 04/02/2019, 11:18 AM   I spent a total of 15 minutes in face to face in clinical consultation, greater than 50% of which was counseling/coordinating care for venous access.

## 2019-04-02 NOTE — Progress Notes (Signed)
Patient was placed on 15L salter nasal cannula per SPO2 in the 70s. MD at bedside. Current sats at 93%.

## 2019-04-02 NOTE — Progress Notes (Signed)
Paged to bedside by rapid response regarding patient's respiratory status. She had remained hypoxic on nonrebreather and was subsequently placed on BiPAP by RT.   On exam, patient is tachypneic, unable to appreciate breath sounds as she continuously tries to speak. She is alert, but seems mildly disoriented. Is able to respond to questions via assistance of son and Patent attorney.   Discussed with patient and son regarding her tenuous respiratory status and their wishes if she continued to decline despite BiPAP. They would like to proceed with full scope of care, including intubation if necessary.  Patient was able to state that her breathing felt somewhat improved since going on BiPAP. Her blood pressure was stable at 150/70 so will try a dose of Lasix 120 mg per nephro recs. Have also consulted PCCM to make them aware of her status; appreciate any additional recommendations.   Amber Dixon IMTS, PGY-2 04/02/2019, 8:15 PM

## 2019-04-02 NOTE — Procedures (Signed)
Patient seen on Hemodialysis. BP (!) 119/55   Pulse 92   Temp 97.6 F (36.4 C)   Resp (!) 30   SpO2 (!) 88%   QB 250, UF goal 1L Tolerating treatment without complaints at this time.   Elmarie Shiley MD [facility] Office # [phone] Pager # [phone] 3:41 PM

## 2019-04-03 ENCOUNTER — Inpatient Hospital Stay (HOSPITAL_COMMUNITY): Payer: Self-pay

## 2019-04-03 DIAGNOSIS — G9340 Encephalopathy, unspecified: Secondary | ICD-10-CM

## 2019-04-03 DIAGNOSIS — J810 Acute pulmonary edema: Secondary | ICD-10-CM

## 2019-04-03 DIAGNOSIS — Z992 Dependence on renal dialysis: Secondary | ICD-10-CM

## 2019-04-03 DIAGNOSIS — N186 End stage renal disease: Secondary | ICD-10-CM

## 2019-04-03 LAB — BASIC METABOLIC PANEL
Anion gap: 16 — ABNORMAL HIGH (ref 5–15)
BUN: 11 mg/dL (ref 8–23)
CO2: 23 mmol/L (ref 22–32)
Calcium: 7.5 mg/dL — ABNORMAL LOW (ref 8.9–10.3)
Chloride: 99 mmol/L (ref 98–111)
Creatinine, Ser: 2.52 mg/dL — ABNORMAL HIGH (ref 0.44–1.00)
GFR calc Af Amer: 22 mL/min — ABNORMAL LOW (ref >60)
GFR calc non Af Amer: 19 mL/min — ABNORMAL LOW (ref >60)
Glucose, Bld: 78 mg/dL (ref 70–99)
Potassium: 3.4 mmol/L — ABNORMAL LOW (ref 3.5–5.1)
Sodium: 138 mmol/L (ref 135–145)

## 2019-04-03 LAB — GLUCOSE, CAPILLARY
Glucose-Capillary: 70 mg/dL (ref 70–99)
Glucose-Capillary: 150 mg/dL — ABNORMAL HIGH (ref 70–99)
Glucose-Capillary: 60 mg/dL — ABNORMAL LOW (ref 70–99)
Glucose-Capillary: 83 mg/dL (ref 70–99)
Glucose-Capillary: 78 mg/dL (ref 70–99)
Glucose-Capillary: 171 mg/dL — ABNORMAL HIGH (ref 70–99)
Glucose-Capillary: 65 mg/dL — ABNORMAL LOW (ref 70–99)

## 2019-04-03 LAB — CBC
HCT: 28.7 % — ABNORMAL LOW (ref 36.0–46.0)
Hemoglobin: 10 g/dL — ABNORMAL LOW (ref 12.0–15.0)
MCH: 27.9 pg (ref 26.0–34.0)
MCHC: 34.8 g/dL (ref 30.0–36.0)
MCV: 79.9 fL — ABNORMAL LOW (ref 80.0–100.0)
Platelets: 273 10*3/uL (ref 150–400)
RBC: 3.59 MIL/uL — ABNORMAL LOW (ref 3.87–5.11)
RDW: 13.6 % (ref 11.5–15.5)
WBC: 25.7 10*3/uL — ABNORMAL HIGH (ref 4.0–10.5)
nRBC: 0 % (ref 0.0–0.2)

## 2019-04-03 LAB — MAGNESIUM: Magnesium: 2 mg/dL (ref 1.7–2.4)

## 2019-04-03 LAB — PHOSPHORUS: Phosphorus: 2.4 mg/dL — ABNORMAL LOW (ref 2.5–4.6)

## 2019-04-03 MED ORDER — DEXTROSE 50 % IV SOLN
INTRAVENOUS | Status: AC
  Administered 2019-04-03: 25 mL
  Filled 2019-04-03: qty 50

## 2019-04-03 MED ORDER — SALINE SPRAY 0.65 % NA SOLN
1.0000 | NASAL | Status: DC | PRN
  Filled 2019-04-03: qty 44

## 2019-04-03 MED ORDER — DEXTROSE 50 % IV SOLN
INTRAVENOUS | Status: AC
  Administered 2019-04-03: 50 mL
  Filled 2019-04-03: qty 50

## 2019-04-03 MED ORDER — DEXTROSE 50 % IV SOLN: 25.0000 mL | INTRAVENOUS | Status: DC | PRN

## 2019-04-03 MED ORDER — AMLODIPINE BESYLATE 5 MG PO TABS
5.0000 mg | ORAL_TABLET | Freq: Every day | ORAL | Status: DC
  Administered 2019-04-04 – 2019-04-05 (×2): 5 mg via ORAL
  Filled 2019-04-03 (×2): qty 1

## 2019-04-03 MED ORDER — HEPARIN SODIUM (PORCINE) 1000 UNIT/ML DIALYSIS
40.0000 [IU]/kg | INTRAMUSCULAR | Status: DC | PRN
  Administered 2019-04-03: 1300 [IU] via INTRAVENOUS_CENTRAL

## 2019-04-03 NOTE — Progress Notes (Signed)
Hypoglycemic Event  CBG: 60  Treatment: D50 25 mL (12.5 gm)  Symptoms: None  Follow-up CBG: [id number] CBG Result:150  Possible Reasons for Event: Inadequate meal intake  Comments/MD notified: No new orders received at this time.     Burna Mortimer P

## 2019-04-03 NOTE — Progress Notes (Signed)
Patient placed on NRB 15L. Off Bipap, tolerating well. Oxygen saturations above 90%. Will continue to monitor.

## 2019-04-03 NOTE — Progress Notes (Addendum)
NAME:  Amber Dixon, MRN:  [id number], DOB:  Oct 08, 1950, LOS: 3 ADMISSION DATE:  03/31/2019, CONSULTATION DATE:  04/02/2019 REFERRING MD:  Dr. Koleen Distance, CHIEF COMPLAINT:  Respiratory Distress   History of present illness   68 year old female with presents to ED on 12/7 with chest pain and shortness of breath. Recent admission 11/27-12/1 for worsening renal failure and anemia. Planned to return to [place] for initiation of dialysis. After leaving hospital patient felt better, however a couple of days later began to experience progressive shortness breath. CXR with worsening bilateral patchy consolidations. Started on Rocephin/Azithromycin for possible CAP. Nephrology consulted. Planned to attempted to diuresis. However on 12/9 required Vascath Placement. Underwent HD 12/9 however no UF pulled. Later in the evening went into respiratory distress requiring BiPAP. PCCM consulted.   Plans to move patient to ICU. Nephrology contacted who agreed to dialyze patient tonight.   Past Medical History  DM, HTN, CKD Stage V  Significant Hospital Events   12/7 > Presents to ED  12/9 > Transferred to ICU for Respiratory Distress   Consults:  Nephrology  PCCM  Procedures:  Bing Neighbors 12/9 >>  Significant Diagnostic Tests:  CXR 12/7 > Worsening bilateral pneumonia, right greater than left. Followup PA and lateral chest X-ray is recommended in 3-4 weeks following trial of antibiotic therapy to ensure resolution and exclude underlying Malignancy.  CXR 12/8 > The heart is enlarged. There are patchy infiltrates throughout the lungs bilaterally. This is a significant change compared to the most recent comparison. The distribution of the opacities is somewhat heterogeneous, favoring infectious process over pulmonary edema but the rapid course suggest atypical pulmonary edema remains a consideration. There are bilateral pleural effusions.  Micro Data:  Blood 12/7 >   Antimicrobials:   Rocephin 12/7 > Azithromycin 12/7 >    Interim history/subjective:  Sitting up in bed able to communicate with bedside nurse. No acute events overnight, dialyses 1.7L off overnight.   Objective   Blood pressure (!) 143/58, pulse 92, temperature 98.4 F (36.9 C), temperature source Axillary, resp. rate 19, weight 33.6 kg, SpO2 97 %.        Intake/Output Summary (Last 24 hours) at 04/03/2019 1001 Last data filed at 04/03/2019 0900 Gross per 24 hour  Intake 176 ml  Output 1739 ml  Net -1563 ml   Filed Weights   04/02/19 2220 04/03/19 0030 04/03/19 0551  Weight: 35.7 kg 34 kg 33.6 kg    Examination: General: Chronically ill appearing very thin elderly female on mechanical ventilation, in NAD HEENT: ETT, MM pink/moist, PERRL,  Neuro: Alert and able to follow commands. Spanish speaking only  CV: s1s2 regular rate and rhythm, no murmur, rubs, or gallops,  PULM:  Diminished in bases, tolerating bipap  GI: soft, bowel sounds active in all 4 quadrants, non-tender, non-distended Extremities: warm/dry, no edema  Skin: no rashes or lesions  Resolved Hospital Problem list   Metabolic acidosis  Hyponatremia   Assessment & Plan:   Acute Hypoxic Respiratory Distress in setting of pulmonary edema/Volume Overload   Plan Continue BIPAP with continued attempts to wean  Wean supplemental oxygen for stats >92 Encourage pulmonary hygiene Nephrology to dialyze again today   Leukocytosis, Concern for CAP -WBC 25.7, procal 1.68 Plan Continue IV antibiotics  Follow WBC and fever  Follow cultures   ESRD > Volume Overload  Plan Nephrology consulted, appreciate assistance  Plans to repeat dialysis today  Trend Bmet  Avoid nephrotoxins  Replace electrolytes as needed  Hypoglycemia  DM Plan Trend glucose  Hypoglycemia protocol as needed   Anemia of Chronic Diease  Plan Trend CBC  Follow ICU transfusion protocol   Hypertension P: Resume home Norvasc    Best practice:   Diet: Renal  DVT prophylaxis: Heparin SQ  GI prophylaxis: Pepcid  Glucose control: Trend Glucose  Mobility: Bedrest  Code Status: FC Family Communication: Son updated at bedside  Disposition: Transfer to ICU   Labs   CBC: Recent Labs  Lab 03/31/19 0840 04/01/19 0241 04/02/19 0244 04/03/19 0350  WBC 11.7* 18.0* 22.0* 25.7*  HGB 12.5 9.3* 10.4* 10.0*  HCT 36.1 27.2* 31.3* 28.7*  MCV 81.5 82.2 83.9 79.9*  PLT 348 267 293 [id number]    Basic Metabolic Panel: Recent Labs  Lab 03/31/19 0840 03/31/19 2135 04/01/19 0241 04/02/19 0244 04/03/19 0350  NA 121* 122* 123* 127* 138  K 4.2 4.6 4.5 3.9 3.4*  CL 89* 92* 93* 93* 99  CO2 14* 14* 13* 11* 23  GLUCOSE 61* 59* 108* 95 78  BUN 93* 91* 90* 95* 11  CREATININE 9.97* 9.21* 8.84* 9.46* 2.52*  CALCIUM 6.8* 6.3* 6.1* 6.6* 7.5*  MG  --   --   --   --  2.0  PHOS  --   --  8.6* 10.2* 2.4*   GFR: CrCl cannot be calculated (Unknown ideal weight.). Recent Labs  Lab 03/31/19 0840 03/31/19 0902 03/31/19 1347 04/01/19 0241 04/02/19 0244 04/03/19 0350  PROCALCITON  --  1.68  --   --   --   --   WBC 11.7*  --   --  18.0* 22.0* 25.7*  LATICACIDVEN  --  1.9 0.8  --   --   --     Liver Function Tests: Recent Labs  Lab 03/31/19 0840 04/01/19 0241 04/02/19 0244  AST 28  --   --   ALT 13  --   --   ALKPHOS 149*  --   --   BILITOT 0.8  --   --   PROT 6.1*  --   --   ALBUMIN 2.5* 1.8* 2.2*   Recent Labs  Lab 03/31/19 0840  LIPASE 38   No results for input(s): AMMONIA in the last 168 hours.  ABG    Component Value Date/Time   TCO2 19 (L) 03/21/2019 1752     Coagulation Profile: No results for input(s): INR, PROTIME in the last 168 hours.  Cardiac Enzymes: No results for input(s): CKTOTAL, CKMB, CKMBINDEX, TROPONINI in the last 168 hours.  HbA1C: Hgb A1c MFr Bld  Date/Time Value Ref Range Status  03/22/2019 06:08 AM 5.9 (H) 4.8 - 5.6 % Final    Comment:    (NOTE) Pre diabetes:          5.7%-6.4% Diabetes:               >6.4% Glycemic control for   <7.0% adults with diabetes     CBG: Recent Labs  Lab 04/02/19 2114 04/02/19 2308 04/03/19 0320 04/03/19 0739 04/03/19 0811  GLUCAP 109* 94 70 60* 150*     Critical care time:    Performed by: Johnsie Cancel   Total critical care time: 38 minutes  Critical care time was exclusive of separately billable procedures and treating other patients.  Critical care was necessary to treat or prevent imminent or life-threatening deterioration.  Critical care was time spent personally by me on the following activities: development of treatment plan with patient and/or surrogate as well  as nursing, discussions with consultants, evaluation of patient's response to treatment, examination of patient, obtaining history from patient or surrogate, ordering and performing treatments and interventions, ordering and review of laboratory studies, ordering and review of radiographic studies, pulse oximetry and re-evaluation of patient's condition.  Johnsie Cancel, NP-C [facility] After hours pager: [phone]. 04/03/2019, 10:10 AM  Attending Note:  68 year old female with ESRD on HD who presents to PCCM with respiratory failure requiring BiPAP.  Overnight, the patient was taken off BiPAP and desaturated down to 48% and responded to being placed back on BiPAP.  On exam, she is alert and interactive with a translator present with diffuse crackles.  I reviewed CXR myself, pulmonary edema noted.  Discussed with nephrology.  Will dialyze again today.  Maintain on BiPAP for now.  Will re-evaluate post dialysis.  The patient is critically ill with multiple organ systems failure and requires high complexity decision making for assessment and support, frequent evaluation and titration of therapies, application of advanced monitoring technologies and extensive interpretation of multiple databases.   Critical Care Time devoted to patient care services  described in this note is  32  Minutes. This time reflects time of care of this signee Dr Jennet Maduro. This critical care time does not reflect procedure time, or teaching time or supervisory time of PA/NP/Med student/Med Resident etc but could involve care discussion time.  Rush Farmer, M.D. [facility]Critical Care Medicine.

## 2019-04-03 NOTE — Progress Notes (Signed)
Attempted to place patient on HFNC since she in uncomfortable with BiPAP. SAT dropped into the 50's on 15L. Back on BiPAP at this time

## 2019-04-03 NOTE — Progress Notes (Signed)
Hypoglycemic Event  CBG: 65  Treatment: D50 25 mL (12.5 gm)  Symptoms: None  Follow-up CBG: Time:2003 CBG Result: 171  Possible Reasons for Event: Unknown  Comments/MD notified: Cloria Spring

## 2019-04-03 NOTE — Progress Notes (Addendum)
Subjective:  Overnight Ms. Chalet Ketcham continued to be hypoxic and was placed on BiPap and transferred to ICU for closer monitoring.   Today Ms. Jabriah Ledet is sleeping on BiPaP, but is responsive. She has difficulty speaking with the BiPap mask but seems to have no new medical complaints.   Scheduled Meds: . azithromycin  250 mg Oral Daily  . calcium acetate  1,334 mg Oral TID WC  . Chlorhexidine Gluconate Cloth  6 each Topical Q0600  . famotidine  20 mg Oral Daily  . heparin  5,000 Units Subcutaneous Q12H  . mouth rinse  15 mL Mouth Rinse BID   Continuous Infusions: . cefTRIAXone (ROCEPHIN)  IV 1 g (04/02/19 0913)  . dexmedetomidine (PRECEDEX) IV infusion 0.2 mcg/kg/hr (04/02/19 2156)   PRN Meds:.acetaminophen, albuterol, heparin, ondansetron (ZOFRAN) IV  Current Labs:   Recent Labs  Lab 04/01/19 0241 04/02/19 0244 04/03/19 0350  NA 123* 127* 138  K 4.5 3.9 3.4*  CL 93* 93* 99  CO2 13* 11* 23  GLUCOSE 108* 95 78  BUN 90* 95* 11  CREATININE 8.84* 9.46* 2.52*  CALCIUM 6.1* 6.6* 7.5*  PHOS 8.6* 10.2* 2.4*   Recent Labs  Lab 04/01/19 0241 04/02/19 0244 04/03/19 0350  WBC 18.0* 22.0* 25.7*  HGB 9.3* 10.4* 10.0*  HCT 27.2* 31.3* 28.7*  MCV 82.2 83.9 79.9*  PLT 267 293 273    Physical Exam:  Blood pressure (!) 143/58, pulse 92, temperature 98.4 F (36.9 C), temperature source Axillary, resp. rate 19, weight 33.6 kg, SpO2 97 %. Physical Exam  Constitutional:  Elderly female, sleeping in bed, appearing ill but in no acute distress  Cardiovascular: Normal rate, regular rhythm and normal heart sounds.  Respiratory:  On BiPap. Lung crackle sounds improving from yesterday  GI: Soft. Bowel sounds are normal.  Musculoskeletal:        General: No edema.  Skin: Skin is warm and dry.  Psychiatric: She has a normal mood and affect.    Assessment and Plan 1. Acute on chronic kidney disease, Stage V. Urine output was not previously recorded. Initial HD  yesterday after Right IJ catheter placement.  - Repeat HD today - Manage uremic symptoms with Pepcid, zofran  2. Metabolic Acidosis with anion gap, secondary to acute kidney injury. CO2 level and anion gap showed improvement with HD yesterday. Should continue to improve with dialysis  3. Hyponatremia, likely secondary to impaired free water excretion, worsened by pulmonary process. Has improved from 127 to 138.   4. Secondary hyperparathyroidism, due to renal failure and hyperphosphatemia. Hypocalcemia improved  with addition of calcium acetate - Calcium acetate with meals  5. Anemia of chronic disease. Hgb has dropped to 10.0, will continue to monitor.   6. Abnormal findings on chest xray. Repeat CXR shows significant changes bilaterally. Concerning for infectious pneumonia v atypical pulmonary edema.  Leukocytosis continued from 22 to 26. - Antibiotics Rocephin, azithromycin   8. Hypoxemia. Secondary to lung volume processes. Placed on BiPap   7. Diabetes Mellitis, Type II. Did have hypoglycemic episode last night attributed to reduced intake. - Insulin regimen, with SSI  8. Hypertension. Patient and son advised not to start captopril. Transient hypotension yesterday likely due to sedation - Amlodipine, should improve with volume unloading from lasix as well   Edwena Felty PA student 04/03/2019, 9:45 AM  I have personally seen and examined this patient and agree with the assessment/plan as outlined above by Edwena Felty PA student. Jamilla Galli K.,MD 04/03/2019 12:36 PM

## 2019-04-04 DIAGNOSIS — Z9989 Dependence on other enabling machines and devices: Secondary | ICD-10-CM

## 2019-04-04 LAB — CBC
HCT: 32.7 % — ABNORMAL LOW (ref 36.0–46.0)
Hemoglobin: 10.5 g/dL — ABNORMAL LOW (ref 12.0–15.0)
MCH: 27.9 pg (ref 26.0–34.0)
MCHC: 32.1 g/dL (ref 30.0–36.0)
MCV: 86.7 fL (ref 80.0–100.0)
Platelets: 253 10*3/uL (ref 150–400)
RBC: 3.77 MIL/uL — ABNORMAL LOW (ref 3.87–5.11)
RDW: 14.7 % (ref 11.5–15.5)
WBC: 21 10*3/uL — ABNORMAL HIGH (ref 4.0–10.5)
nRBC: 0 % (ref 0.0–0.2)

## 2019-04-04 LAB — RENAL FUNCTION PANEL
Albumin: 2.3 g/dL — ABNORMAL LOW (ref 3.5–5.0)
Anion gap: 18 — ABNORMAL HIGH (ref 5–15)
BUN: 10 mg/dL (ref 8–23)
CO2: 21 mmol/L — ABNORMAL LOW (ref 22–32)
Calcium: 8.1 mg/dL — ABNORMAL LOW (ref 8.9–10.3)
Chloride: 98 mmol/L (ref 98–111)
Creatinine, Ser: 2.7 mg/dL — ABNORMAL HIGH (ref 0.44–1.00)
GFR calc Af Amer: 20 mL/min — ABNORMAL LOW (ref >60)
GFR calc non Af Amer: 17 mL/min — ABNORMAL LOW (ref >60)
Glucose, Bld: 90 mg/dL (ref 70–99)
Phosphorus: 3.1 mg/dL (ref 2.5–4.6)
Potassium: 2.8 mmol/L — ABNORMAL LOW (ref 3.5–5.1)
Sodium: 137 mmol/L (ref 135–145)

## 2019-04-04 LAB — GLUCOSE, CAPILLARY
Glucose-Capillary: 110 mg/dL — ABNORMAL HIGH (ref 70–99)
Glucose-Capillary: 159 mg/dL — ABNORMAL HIGH (ref 70–99)
Glucose-Capillary: 164 mg/dL — ABNORMAL HIGH (ref 70–99)
Glucose-Capillary: 77 mg/dL (ref 70–99)
Glucose-Capillary: 80 mg/dL (ref 70–99)
Glucose-Capillary: 85 mg/dL (ref 70–99)
Glucose-Capillary: 88 mg/dL (ref 70–99)
Glucose-Capillary: 93 mg/dL (ref 70–99)

## 2019-04-04 MED ORDER — POTASSIUM CHLORIDE CRYS ER 20 MEQ PO TBCR
40.0000 meq | EXTENDED_RELEASE_TABLET | ORAL | Status: AC
  Administered 2019-04-04 (×2): 40 meq via ORAL
  Filled 2019-04-04 (×2): qty 2

## 2019-04-04 MED ORDER — HEPARIN SODIUM (PORCINE) 1000 UNIT/ML IJ SOLN
INTRAMUSCULAR | Status: AC
  Administered 2019-04-04: 2600 [IU] via INTRAVENOUS
  Filled 2019-04-04: qty 4

## 2019-04-04 NOTE — Progress Notes (Signed)
SLP Cancellation Note  Patient Details Name: Amber Dixon MRN: [id number] DOB: February 02, 1951   Cancelled treatment:       Reason Eval/Treat Not Completed: Patient not medically ready.  Pt has been on NRB or BiPAP all day today and is now at HD.  Will continue efforts as pt is able to tolerate bedside swallow evaluation    [name], Selinda Orion 04/04/2019, 4:12 PM

## 2019-04-04 NOTE — Progress Notes (Signed)
RT note: RT called to [facility] for patient desats into 70s. RN stated patient not in distress at this time. RT responded.  Patient sating 100% on NRB and in no distress. RT reviewed patients chart. Patient removed NRB to clean her nose and within 2 minutes had dropped her sts to 72% with good pleth. Patient does not have any reserves so RT placed patient back on BIPAP and titrated for patient comfort vital signs stable at this time. Will inform patient unit RT.

## 2019-04-04 NOTE — Progress Notes (Signed)
NAME:  Amber Dixon, MRN:  [id number], DOB:  [birth year], LOS: 4 ADMISSION DATE:  03/31/2019, CONSULTATION DATE:  04/02/2019 REFERRING MD:  Dr. Koleen Distance, CHIEF COMPLAINT:  Respiratory Distress   History of present illness   68 year old female with presents to ED on 12/7 with chest pain and shortness of breath. Recent admission 11/27-12/1 for worsening renal failure and anemia. Planned to return to [place] for initiation of dialysis. After leaving hospital patient felt better, however a couple of days later began to experience progressive shortness breath. CXR with worsening bilateral patchy consolidations. Started on Rocephin/Azithromycin for possible CAP. Nephrology consulted. Planned to attempted to diuresis. However on 12/9 required Vascath Placement. Underwent HD 12/9 however no UF pulled. Later in the evening went into respiratory distress requiring BiPAP. PCCM consulted.   Plans to move patient to ICU. Nephrology contacted who agreed to dialyze patient tonight.   Past Medical History  DM, HTN, CKD Stage V  Significant Hospital Events   12/7 > Presents to ED  12/9 > Transferred to ICU for Respiratory Distress   Consults:  Nephrology  PCCM  Procedures:  Bing Neighbors 12/9 >>  Significant Diagnostic Tests:  CXR 12/7 > Worsening bilateral pneumonia, right greater than left. Followup PA and lateral chest X-ray is recommended in 3-4 weeks following trial of antibiotic therapy to ensure resolution and exclude underlying Malignancy.  CXR 12/8 > The heart is enlarged. There are patchy infiltrates throughout the lungs bilaterally. This is a significant change compared to the most recent comparison. The distribution of the opacities is somewhat heterogeneous, favoring infectious process over pulmonary edema but the rapid course suggest atypical pulmonary edema remains a consideration. There are bilateral pleural effusions.  Micro Data:  Blood 12/7 >   Antimicrobials:    Rocephin 12/7 > Azithromycin 12/7 >    Interim history/subjective:  Remains on BiPAP, intermittent desaturation overnight but appears more stable this AM  Objective   Blood pressure (!) 152/68, pulse (!) 103, temperature 98.6 F (37 C), temperature source Oral, resp. rate (!) 22, weight 31.6 kg, SpO2 94 %.    FiO2 (%):  [100 %] 100 %   Intake/Output Summary (Last 24 hours) at 04/04/2019 0854 Last data filed at 04/03/2019 1630 Gross per 24 hour  Intake 202.81 ml  Output 2000 ml  Net -1797.19 ml   Filed Weights   04/03/19 0551 04/03/19 1320 04/03/19 1630  Weight: 33.6 kg 33.6 kg 31.6 kg    Examination: General: Chronically ill appearing female HEENT: ETT, MM pink/moist, PERRL,  Neuro: Alert and able to follow commands. Spanish speaking only  CV: RRR, Nl S1/S2 and -M/R/G PULM:  Diffuse crackles GI: Soft, NT, ND and +BS Extremities: warm/dry, no edema  Skin: no rashes or lesions  I reviewed CXR myself, infiltrate noted  Discussed with [facility] Problem list   Metabolic acidosis  Hyponatremia   Assessment & Plan:   Acute Hypoxic Respiratory Distress in setting of pulmonary edema/Volume Overload   Plan Attempt to get to NRB from BiPAP today Titrate O2 for sat of 88-92% Encourage pulmonary hygiene Dialysis again today  Leukocytosis, Concern for CAP -WBC 25.7, procal 1.68 Plan Continue IV abx for now F/U on cultures Follow WBC and fever   ESRD > Volume Overload  Plan Nephrology consulted, appreciate assistance  HD again today with volume negative as tolerated Trend Bmet  Avoid nephrotoxins  Replace electrolytes as indicated  Hypoglycemia  DM Plan Trend glucose  Hypoglycemia protocol as  needed   Anemia of Chronic Diease  Plan Trend CBC  Follow ICU transfusion protocol   Hypertension P: Resume home Norvasc    Will re-evaluate in the afternoon, if no longer requiring BiPAP will consider transfer out of the ICU  Best practice:   Diet: Renal  DVT prophylaxis: Heparin SQ  GI prophylaxis: Pepcid  Glucose control: Trend Glucose  Mobility: Bedrest  Code Status: FC Family Communication: Son updated at bedside  Disposition: Transfer to ICU   Labs   CBC: Recent Labs  Lab 03/31/19 0840 04/01/19 0241 04/02/19 0244 04/03/19 0350 04/04/19 0540  WBC 11.7* 18.0* 22.0* 25.7* 21.0*  HGB 12.5 9.3* 10.4* 10.0* 10.5*  HCT 36.1 27.2* 31.3* 28.7* 32.7*  MCV 81.5 82.2 83.9 79.9* 86.7  PLT 348 267 293 273 [id number]    Basic Metabolic Panel: Recent Labs  Lab 03/31/19 2135 04/01/19 0241 04/02/19 0244 04/03/19 0350 04/04/19 0531  NA 122* 123* 127* 138 137  K 4.6 4.5 3.9 3.4* 2.8*  CL 92* 93* 93* 99 98  CO2 14* 13* 11* 23 21*  GLUCOSE 59* 108* 95 78 90  BUN 91* 90* 95* 11 10  CREATININE 9.21* 8.84* 9.46* 2.52* 2.70*  CALCIUM 6.3* 6.1* 6.6* 7.5* 8.1*  MG  --   --   --  2.0  --   PHOS  --  8.6* 10.2* 2.4* 3.1   GFR: CrCl cannot be calculated (Unknown ideal weight.). Recent Labs  Lab 03/31/19 0902 03/31/19 1347 04/01/19 0241 04/02/19 0244 04/03/19 0350 04/04/19 0540  PROCALCITON 1.68  --   --   --   --   --   WBC  --   --  18.0* 22.0* 25.7* 21.0*  LATICACIDVEN 1.9 0.8  --   --   --   --     Liver Function Tests: Recent Labs  Lab 03/31/19 0840 04/01/19 0241 04/02/19 0244 04/04/19 0531  AST 28  --   --   --   ALT 13  --   --   --   ALKPHOS 149*  --   --   --   BILITOT 0.8  --   --   --   PROT 6.1*  --   --   --   ALBUMIN 2.5* 1.8* 2.2* 2.3*   Recent Labs  Lab 03/31/19 0840  LIPASE 38   No results for input(s): AMMONIA in the last 168 hours.  ABG    Component Value Date/Time   TCO2 19 (L) 03/21/2019 1752     Coagulation Profile: No results for input(s): INR, PROTIME in the last 168 hours.  Cardiac Enzymes: No results for input(s): CKTOTAL, CKMB, CKMBINDEX, TROPONINI in the last 168 hours.  HbA1C: Hgb A1c MFr Bld  Date/Time Value Ref Range Status  03/22/2019 06:08 AM 5.9 (H) 4.8 - 5.6  % Final    Comment:    (NOTE) Pre diabetes:          5.7%-6.4% Diabetes:              >6.4% Glycemic control for   <7.0% adults with diabetes     CBG: Recent Labs  Lab 04/03/19 2148 04/03/19 2209 04/04/19 0100 04/04/19 0545 04/04/19 0818  GLUCAP 65* 171* 110* 85 77   The patient is critically ill with multiple organ systems failure and requires high complexity decision making for assessment and support, frequent evaluation and titration of therapies, application of advanced monitoring technologies and extensive interpretation of multiple databases.   Critical  Care Time devoted to patient care services described in this note is  33  Minutes. This time reflects time of care of this signee Dr Jennet Maduro. This critical care time does not reflect procedure time, or teaching time or supervisory time of PA/NP/Med student/Med Resident etc but could involve care discussion time.  Rush Farmer, M.D. [facility] Pulmonary/Critical Care Medicine.

## 2019-04-04 NOTE — Procedures (Signed)
Patient seen on Hemodialysis. BP (!) 121/55 (BP Location: Left Arm)   Pulse 100   Temp 99 F (37.2 C) (Axillary)   Resp (!) 25   Wt 31.5 kg   SpO2 100%   QB 300, UF goal 2L Tolerating treatment without complaints at this time.   Elmarie Shiley MD [facility]. Office # [phone] Pager # [phone] 2:28 PM

## 2019-04-04 NOTE — Progress Notes (Signed)
Pt BiPAP on stand by at this time at bedside. Pt currently on non-rebreather 15L tolerating well at this time with a saturation of 100%. RT will continue to monitor.

## 2019-04-04 NOTE — Progress Notes (Signed)
Pt taken off BiPAP in dialysis and placed on non-rebreather 15L. Pt tolerating well with a saturation of 100% and RR of 21. BiPAP now in stand by at pt bedside. RT will continue to monitor.

## 2019-04-04 NOTE — Progress Notes (Signed)
I have personally seen and examined this patient and agree with the assessment/plan as outlined below by Edwena Felty PA student.  Patient with ESRD and newly started HD. Respiratory status remain tenuous but unlikely to be entirely from pulmonary edema--favor infectious process.  Plan for HD today with 4K bath to correct hypokalemia and challenge EDW further. She will be going back to [place] to resume chronic dialysis after stabilization here.  Amber Buckalew K.,MD 04/04/2019 10:12 AM   Subjective:  Today [name] is alert on 15L O2 via nonrebreather mask. She has complaints of nausea, but otherwise is feeling much better. She has concerns about her sons in [place] needing a letter to visit her and if/how she will travel back to [place]  Scheduled Meds: . amLODipine  5 mg Oral Daily  . azithromycin  250 mg Oral Daily  . calcium acetate  1,334 mg Oral TID WC  . Chlorhexidine Gluconate Cloth  6 each Topical Q0600  . famotidine  20 mg Oral Daily  . heparin  5,000 Units Subcutaneous Q12H  . mouth rinse  15 mL Mouth Rinse BID  . potassium chloride  40 mEq Oral Q2H   Continuous Infusions: . cefTRIAXone (ROCEPHIN)  IV 1 g (04/04/19 0848)  . dexmedetomidine (PRECEDEX) IV infusion 1.102 mcg/kg/hr (04/03/19 1241)   PRN Meds:.acetaminophen, albuterol, dextrose, heparin, heparin, ondansetron (ZOFRAN) IV, sodium chloride  Current Labs:   Recent Labs  Lab 04/02/19 0244 04/03/19 0350 04/04/19 0531  NA 127* 138 137  K 3.9 3.4* 2.8*  CL 93* 99 98  CO2 11* 23 21*  GLUCOSE 95 78 90  BUN 95* 11 10  CREATININE 9.46* 2.52* 2.70*  CALCIUM 6.6* 7.5* 8.1*  PHOS 10.2* 2.4* 3.1   Recent Labs  Lab 04/02/19 0244 04/03/19 0350 04/04/19 0540  WBC 22.0* 25.7* 21.0*  HGB 10.4* 10.0* 10.5*  HCT 31.3* 28.7* 32.7*  MCV 83.9 79.9* 86.7  PLT 293 273 253    Physical Exam:  Blood pressure (!) 152/68, pulse (!) 103, temperature 98.6 F (37 C), temperature source Oral, resp. rate (!) 22, weight  31.6 kg, SpO2 94 %. Physical Exam  Constitutional:  Elderly thin female in no acute distress  Cardiovascular: Regular rhythm and normal heart sounds.  tachycardic  Respiratory:  On 15L O2 via nonrebreather mask. Diffuse course crackles heard posteriorly  GI: Soft. Bowel sounds are normal.  Musculoskeletal:        General: No edema.  Skin: Skin is warm and dry.  Psychiatric: She has a normal mood and affect.    Assessment and Plan 1. Acute on chronic kidney disease, Stage V. Urine output was not previously recorded. Right IJ catheter placed 12/9. HD yesterday tolerated well with net filtration 2L. Improved renal function and volume status - Use 4K bath for hypokalemia today - Manage uremic symptoms with Pepcid, zofran   2. Metabolic Acidosis with anion gap, Corrected with dialysis   3. Hyponatremia, likely secondary to impaired free water excretion, worsened by pulmonary process. corrected with dialysis   4. Secondary hyperparathyroidism. Hypocalcemia/hyperphosphatemia from CKD improved with addition of calcium acetate TIDAC   5. Anemia of chronic disease. Hgb 10.5. will continue to monitor.   6. Abnormal findings on chest xray. Concerning for infectious pneumonia v atypical pulmonary edema.  Leukocytosis has improved slightly from 25 down to 21.  - Antibiotics Rocephin, azithromycin   8. Hypoxemia. Off NIPPV and now on NRB--attempt volume unloading with HD today and follow.    7. Diabetes Mellitis,  Type II. Did have hypoglycemic episode last night attributed to reduced intake. - Insulin regimen, with SSI   8. Hypertension. Controlled on Amlodipine 5mg / UF with HD  Social work has been made aware of her concerns regarding her family and travel back to [place].  [name] PA student 04/04/2019, 9:12 AM   Elmarie Shiley MD [facility]. Office # [phone] Pager # [phone] 10:14 AM

## 2019-04-05 ENCOUNTER — Inpatient Hospital Stay (HOSPITAL_COMMUNITY): Payer: Self-pay

## 2019-04-05 LAB — CULTURE, BLOOD (ROUTINE X 2)
Culture: NO GROWTH
Culture: NO GROWTH
Special Requests: ADEQUATE

## 2019-04-05 LAB — MAGNESIUM: Magnesium: 2 mg/dL (ref 1.7–2.4)

## 2019-04-05 LAB — BASIC METABOLIC PANEL
Anion gap: 17 — ABNORMAL HIGH (ref 5–15)
BUN: 24 mg/dL — ABNORMAL HIGH (ref 8–23)
CO2: 20 mmol/L — ABNORMAL LOW (ref 22–32)
Calcium: 8.1 mg/dL — ABNORMAL LOW (ref 8.9–10.3)
Chloride: 94 mmol/L — ABNORMAL LOW (ref 98–111)
Creatinine, Ser: 3.05 mg/dL — ABNORMAL HIGH (ref 0.44–1.00)
GFR calc Af Amer: 17 mL/min — ABNORMAL LOW (ref >60)
GFR calc non Af Amer: 15 mL/min — ABNORMAL LOW (ref >60)
Glucose, Bld: 171 mg/dL — ABNORMAL HIGH (ref 70–99)
Potassium: 4 mmol/L (ref 3.5–5.1)
Sodium: 131 mmol/L — ABNORMAL LOW (ref 135–145)

## 2019-04-05 LAB — CBC
HCT: 31 % — ABNORMAL LOW (ref 36.0–46.0)
Hemoglobin: 10.2 g/dL — ABNORMAL LOW (ref 12.0–15.0)
MCH: 28.1 pg (ref 26.0–34.0)
MCHC: 32.9 g/dL (ref 30.0–36.0)
MCV: 85.4 fL (ref 80.0–100.0)
Platelets: 192 10*3/uL (ref 150–400)
RBC: 3.63 MIL/uL — ABNORMAL LOW (ref 3.87–5.11)
RDW: 14.8 % (ref 11.5–15.5)
WBC: 16.1 10*3/uL — ABNORMAL HIGH (ref 4.0–10.5)
nRBC: 0 % (ref 0.0–0.2)

## 2019-04-05 LAB — POCT I-STAT 7, (LYTES, BLD GAS, ICA,H+H)
Acid-base deficit: 1 mmol/L (ref 0.0–2.0)
Bicarbonate: 23.5 mmol/L (ref 20.0–28.0)
Calcium, Ion: 1.11 mmol/L — ABNORMAL LOW (ref 1.15–1.40)
HCT: 30 % — ABNORMAL LOW (ref 36.0–46.0)
Hemoglobin: 10.2 g/dL — ABNORMAL LOW (ref 12.0–15.0)
O2 Saturation: 100 %
Potassium: 4 mmol/L (ref 3.5–5.1)
Sodium: 127 mmol/L — ABNORMAL LOW (ref 135–145)
TCO2: 25 mmol/L (ref 22–32)
pCO2 arterial: 37.4 mmHg (ref 32.0–48.0)
pH, Arterial: 7.406 (ref 7.350–7.450)
pO2, Arterial: 167 mmHg — ABNORMAL HIGH (ref 83.0–108.0)

## 2019-04-05 LAB — PHOSPHORUS: Phosphorus: 2.1 mg/dL — ABNORMAL LOW (ref 2.5–4.6)

## 2019-04-05 LAB — GLUCOSE, CAPILLARY
Glucose-Capillary: 131 mg/dL — ABNORMAL HIGH (ref 70–99)
Glucose-Capillary: 131 mg/dL — ABNORMAL HIGH (ref 70–99)
Glucose-Capillary: 167 mg/dL — ABNORMAL HIGH (ref 70–99)
Glucose-Capillary: 237 mg/dL — ABNORMAL HIGH (ref 70–99)
Glucose-Capillary: 71 mg/dL (ref 70–99)
Glucose-Capillary: 82 mg/dL (ref 70–99)

## 2019-04-05 MED ORDER — CALCIUM ACETATE (PHOS BINDER) 667 MG PO CAPS
667.0000 mg | ORAL_CAPSULE | Freq: Three times a day (TID) | ORAL | Status: DC
  Administered 2019-04-05 – 2019-04-06 (×3): 667 mg via ORAL
  Filled 2019-04-05 (×3): qty 1

## 2019-04-05 MED ORDER — SODIUM CHLORIDE 1 G PO TABS
1.0000 g | ORAL_TABLET | Freq: Once | ORAL | Status: AC
  Administered 2019-04-05: 1 g via ORAL
  Filled 2019-04-05: qty 1

## 2019-04-05 NOTE — Progress Notes (Signed)
SLP Cancellation Note  Patient Details Name: Amber Dixon MRN: [id number] DOB: 1950/09/12   Cancelled treatment:       Reason Eval/Treat Not Completed: Medical issues which prohibited therapy. Patient was placed back on NRB. Will attempt next date.   [name] 04/05/2019, 3:39 PM    Sonia Baller, MA, [facility]

## 2019-04-05 NOTE — Progress Notes (Signed)
PROGRESS NOTE    Amber Dixon  [id number] DOB: 31-May-1950 DOA: 03/31/2019 PCP: Patient, No Pcp Per  Outpatient Specialists:   Brief Narrative:  Patient is a 68 year old Hispanic female with past medical history significant for diabetes mellitus, hypertension, CKD 5, progressed to end-stage renal disease.  Patient presented to the hospital on 03/31/2019 with chest pain and shortness of breath.  Patient was recently admitted to the hospital from 11/27-12/1 with worsening renal failure and anemia.  The initial plan was for patient to return to [place] for initiation of dialysis. After leaving hospital, patient felt better, however, a couple of days later began to experience progressive shortness breath. CXR revealed worsening bilateral patchy consolidations.  Patient was started on Rocephin/Azithromycin for possible CAP. Nephrology consulted, attempted to diuresis. However, on 12/9 required Vascath Placement. Underwent HD 12/9 however no UF pulled. Later in the evening went into respiratory distress requiring BiPAP. PCCM consulted, patient to ICU, Nephrology contacted who agreed to dialyze patient same night.  Patient is still requiring 15 L of supplemental oxygen despite having had hemodialysis.  Patient is also on treatment for possible community acquired pneumonia.  Patient has been transferred to the care of the hospitalist team.  [facility] Events   12/7 > Presents to ED  12/9 > Transferred to ICU for Respiratory Distress    Procedures:  Bing Neighbors 12/9 >>  Significant Diagnostic Tests:  CXR 12/7 > Worsening bilateral pneumonia, right greater than left. Followup PA and lateral chest X-ray is recommended in 3-4 weeks following trial of antibiotic therapy to ensure resolution and exclude underlying Malignancy.  CXR 12/8 > The heart is enlarged. There are patchy infiltrates throughout the lungs bilaterally. This is a significant change compared to the  most recent comparison. The distribution of the opacities is somewhat heterogeneous, favoring infectious process over pulmonary edema but the rapid course suggest atypical pulmonary edema remains a consideration. There are bilateral pleural effusions.  Micro Data:  Blood 12/7 >   Antimicrobials:  Rocephin 12/7 > Azithromycin 12/7 >    04/05/2019: Patient seen alongside patient's son.  Patient is still requiring 15 L of supplemental oxygen.  We will proceed with repeat chest x-ray and ABG.  Management depend on hospital course.  Assessment & Plan:   Active Problems:   Acute respiratory failure with hypoxemia (HCC)  Acute Hypoxic Respiratory Distress in setting of pulmonary edema/Volume Overload   Plan Attempt to get to NRB from BiPAP today Titrate O2 for sat of 88-92% Encourage pulmonary hygiene Dialysis again today 04/05/2019: Patient seen alongside patient's son.  Patient is still requiring 15 L of supplemental oxygen.  Leukocytosis, Concern for CAP -WBC 25.7, procal 1.68 Plan Continue IV abx for now F/U on cultures Follow WBC and fever  04/05/2019: No documented fever overnight.  WBC is down to 16.1.  ESRD > Volume Overload  Plan Nephrology consulted, appreciate assistance  HD again today with volume negative as tolerated Trend Bmet  Avoid nephrotoxins  Replace electrolytes as indicated 04/05/2019: We will defer to the nephrology team.  Hypoglycemia  DM Plan Trend glucose  Hypoglycemia protocol as needed  04/05/2019: Manage expectantly.  Anemia of Chronic Diease  Plan Trend CBC  Follow ICU transfusion protocol   Hypertension P: Resume home Norvasc    DVT prophylaxis: Subacute heparin Code Status: Full code Family Communication: Son Disposition Plan: This will depend on hospital course   Consultants:   Nephrology  Patient was under the care of critical care team.  Procedures:  Dialysis access placement  Antimicrobials:    Currently on only IV Rocephin   Subjective: No complaints No fever chills  Objective: Vitals:   04/05/19 0800 04/05/19 0900 04/05/19 1000 04/05/19 1100  BP: (!) 152/67 (!) 154/70 (!) 146/67 (!) 146/69  Pulse: 95 92 95 91  Resp: (!) 22 (!) 21 (!) 23 (!) 26  Temp:      TempSrc:      SpO2: 100% 100% 100% 100%  Weight:        Intake/Output Summary (Last 24 hours) at 04/05/2019 1228 Last data filed at 04/05/2019 1100 Gross per 24 hour  Intake 560.18 ml  Output 1327 ml  Net -766.82 ml   Filed Weights   04/03/19 1630 04/04/19 1250 04/04/19 1626  Weight: 31.6 kg 31.5 kg 29.9 kg    Examination:  General exam: Appears calm and comfortable.  Tiny lady Respiratory system: Decreased air entry. Cardiovascular system: S1 & S2 heard Gastrointestinal system: Abdomen is nondistended, soft and nontender. No organomegaly or masses felt. Normal bowel sounds heard. Central nervous system: Awake and alert.  Patient moves all extremities.   Extremities: No leg edema  Data Reviewed: I have personally reviewed following labs and imaging studies  CBC: Recent Labs  Lab 04/01/19 0241 04/02/19 0244 04/03/19 0350 04/04/19 0540 04/05/19 1032  WBC 18.0* 22.0* 25.7* 21.0* 16.1*  HGB 9.3* 10.4* 10.0* 10.5* 10.2*  HCT 27.2* 31.3* 28.7* 32.7* 31.0*  MCV 82.2 83.9 79.9* 86.7 85.4  PLT 267 293 273 253 [id number]   Basic Metabolic Panel: Recent Labs  Lab 04/01/19 0241 04/02/19 0244 04/03/19 0350 04/04/19 0531 04/05/19 1032  NA 123* 127* 138 137 131*  K 4.5 3.9 3.4* 2.8* 4.0  CL 93* 93* 99 98 94*  CO2 13* 11* 23 21* 20*  GLUCOSE 108* 95 78 90 171*  BUN 90* 95* 11 10 24*  CREATININE 8.84* 9.46* 2.52* 2.70* 3.05*  CALCIUM 6.1* 6.6* 7.5* 8.1* 8.1*  MG  --   --  2.0  --  2.0  PHOS 8.6* 10.2* 2.4* 3.1 2.1*   GFR: CrCl cannot be calculated (Unknown ideal weight.). Liver Function Tests: Recent Labs  Lab 03/31/19 0840 04/01/19 0241 04/02/19 0244 04/04/19 0531  AST 28  --   --   --    ALT 13  --   --   --   ALKPHOS 149*  --   --   --   BILITOT 0.8  --   --   --   PROT 6.1*  --   --   --   ALBUMIN 2.5* 1.8* 2.2* 2.3*   Recent Labs  Lab 03/31/19 0840  LIPASE 38   No results for input(s): AMMONIA in the last 168 hours. Coagulation Profile: No results for input(s): INR, PROTIME in the last 168 hours. Cardiac Enzymes: No results for input(s): CKTOTAL, CKMB, CKMBINDEX, TROPONINI in the last 168 hours. BNP (last 3 results) No results for input(s): PROBNP in the last 8760 hours. HbA1C: No results for input(s): HGBA1C in the last 72 hours. CBG: Recent Labs  Lab 04/04/19 1934 04/04/19 2336 04/05/19 0355 04/05/19 0758 04/05/19 1200  GLUCAP 93 88 82 71 167*   Lipid Profile: No results for input(s): CHOL, HDL, LDLCALC, TRIG, CHOLHDL, LDLDIRECT in the last 72 hours. Thyroid Function Tests: No results for input(s): TSH, T4TOTAL, FREET4, T3FREE, THYROIDAB in the last 72 hours. Anemia Panel: No results for input(s): VITAMINB12, FOLATE, FERRITIN, TIBC, IRON, RETICCTPCT in the last 72 hours. Urine analysis:  Component Value Date/Time   COLORURINE YELLOW 03/31/2019 1345   APPEARANCEUR CLOUDY (A) 03/31/2019 1345   LABSPEC 1.014 03/31/2019 1345   PHURINE 6.0 03/31/2019 1345   GLUCOSEU 50 (A) 03/31/2019 1345   HGBUR SMALL (A) 03/31/2019 1345   BILIRUBINUR NEGATIVE 03/31/2019 1345   KETONESUR 5 (A) 03/31/2019 1345   PROTEINUR >=300 (A) 03/31/2019 1345   NITRITE NEGATIVE 03/31/2019 1345   LEUKOCYTESUR NEGATIVE 03/31/2019 1345   Sepsis Labs: @LABRCNTIP (procalcitonin:4,lacticidven:4)  ) Recent Results (from the past 240 hour(s))  SARS CORONAVIRUS 2 (TAT 6-24 HRS) Nasopharyngeal Nasopharyngeal Swab     Status: None   Collection Time: 03/31/19  9:11 AM   Specimen: Nasopharyngeal Swab  Result Value Ref Range Status   SARS Coronavirus 2 NEGATIVE NEGATIVE Final    Comment: (NOTE) SARS-CoV-2 target nucleic acids are NOT DETECTED. The SARS-CoV-2 RNA is generally  detectable in upper and lower respiratory specimens during the acute phase of infection. Negative results do not preclude SARS-CoV-2 infection, do not rule out co-infections with other pathogens, and should not be used as the sole basis for treatment or other patient management decisions. Negative results must be combined with clinical observations, patient history, and epidemiological information. The expected result is Negative. Fact Sheet for Patients: SugarRoll.be Fact Sheet for Healthcare Providers: https://www.woods-mathews.com/ This test is not yet approved or cleared by the [place] FDA and  has been authorized for detection and/or diagnosis of SARS-CoV-2 by FDA under an Emergency Use Authorization (EUA). This EUA will remain  in effect (meaning this test can be used) for the duration of the COVID-19 declaration under Section 56 4(b)(1) of the Act, 21 U.S.C. section 360bbb-3(b)(1), unless the authorization is terminated or revoked sooner. Performed at [facility], [place] [address], [place], [place] [address]   Blood Culture (routine x 2)     Status: None   Collection Time: 03/31/19  9:15 AM   Specimen: BLOOD  Result Value Ref Range Status   Specimen Description BLOOD RIGHT ANTECUBITAL  Final   Special Requests   Final    BOTTLES DRAWN AEROBIC AND ANAEROBIC Blood Culture adequate volume   Culture   Final    NO GROWTH 5 DAYS Performed at [facility], [place] [address], [place], [place] [address]    Report Status 04/05/2019 FINAL  Final  Blood Culture (routine x 2)     Status: None   Collection Time: 03/31/19  1:09 PM   Specimen: BLOOD  Result Value Ref Range Status   Specimen Description BLOOD LEFT ANTECUBITAL  Final   Special Requests   Final    BOTTLES DRAWN AEROBIC ONLY Blood Culture results may not be optimal due to an inadequate volume of blood received in culture bottles   Culture   Final    NO GROWTH 5  DAYS Performed at [facility], [place] [address], [place],  [address]    Report Status 04/05/2019 FINAL  Final         Radiology Studies: No results found.      Scheduled Meds: . amLODipine  5 mg Oral Daily  . azithromycin  250 mg Oral Daily  . calcium acetate  667 mg Oral TID WC  . Chlorhexidine Gluconate Cloth  6 each Topical Q0600  . famotidine  20 mg Oral Daily  . heparin  5,000 Units Subcutaneous Q12H  . mouth rinse  15 mL Mouth Rinse BID   Continuous Infusions: . cefTRIAXone (ROCEPHIN)  IV Stopped (04/05/19 1042)  .  dexmedetomidine (PRECEDEX) IV infusion 1.102 mcg/kg/hr (04/03/19 1241)     LOS: 5 days    Time spent: 35 minutes   Dana Allan, MD  [facility] Pager #: [phone] 7PM-7AM contact night coverage as above

## 2019-04-05 NOTE — Progress Notes (Signed)
Patient ID: Amber Dixon, female   DOB: 1951-04-13, 68 y.o.   MRN: [id number]  [facility] Progress Note   Assessment/ Plan:   1.  Hypoxic respiratory failure: Appears to be bifactorial from pneumonia/pulmonary edema.  She has had 3 consequent days of dialysis with net negative fluid balance/ultrafiltration to try and eliminate the latter as an etiology for respiratory failure.  Continue efforts at bronchodilator therapy/respiratory therapy to help clear secretions to attempt weaning down oxygen.  No acute dialysis needs today. 2. ESRD: With unrecognized chronic kidney disease that has progressed on now to end-stage renal disease with severe azotemia.  Started on hemodialysis 3 days ago with the plan to try and stabilize her from a medical standpoint to withstand transportation back to [place] where she is a citizen.  No acute dialysis needs today. 3. Anemia: Hemoglobin and hematocrit are stable at this time without any overt loss. 4. CKD-MBD: Phosphorus level is at goal, calcium acceptable when corrected for albumin.  Will decrease calcium acetate dose. 5.  Hypokalemia: Secondary to diminished intake/dialysis losses-await labs from this morning. 6. Hypertension: Blood pressure under good control at this time with HD/meds.  Subjective:   Without acute events overnight, intermittently hypoxemic off supplementation   Objective:   BP (!) 145/67   Pulse 87   Temp 98.2 F (36.8 C) (Oral)   Resp 20   Wt 29.9 kg   SpO2 100%   Physical Exam: Gen: Appears comfortable resting in bed, oxygen via NRB CVS: Pulse regular rhythm, normal rate, S1 and S2 normal Resp: Clear to auscultation bilaterally, no distinct rales or rhonchi Abd: Soft, flat, nontender, bowel sounds normal Ext: No lower extremity edema  Labs: BMET Recent Labs  Lab 03/31/19 0840 03/31/19 2135 04/01/19 0241 04/02/19 0244 04/03/19 0350 04/04/19 0531  NA 121* 122* 123* 127* 138 137  K 4.2 4.6 4.5  3.9 3.4* 2.8*  CL 89* 92* 93* 93* 99 98  CO2 14* 14* 13* 11* 23 21*  GLUCOSE 61* 59* 108* 95 78 90  BUN 93* 91* 90* 95* 11 10  CREATININE 9.97* 9.21* 8.84* 9.46* 2.52* 2.70*  CALCIUM 6.8* 6.3* 6.1* 6.6* 7.5* 8.1*  PHOS  --   --  8.6* 10.2* 2.4* 3.1   CBC Recent Labs  Lab 04/01/19 0241 04/02/19 0244 04/03/19 0350 04/04/19 0540  WBC 18.0* 22.0* 25.7* 21.0*  HGB 9.3* 10.4* 10.0* 10.5*  HCT 27.2* 31.3* 28.7* 32.7*  MCV 82.2 83.9 79.9* 86.7  PLT 267 293 273 253      Medications:    . amLODipine  5 mg Oral Daily  . azithromycin  250 mg Oral Daily  . calcium acetate  1,334 mg Oral TID WC  . Chlorhexidine Gluconate Cloth  6 each Topical Q0600  . famotidine  20 mg Oral Daily  . heparin  5,000 Units Subcutaneous Q12H  . mouth rinse  15 mL Mouth Rinse BID   Elmarie Shiley, MD 04/05/2019, 8:52 AM

## 2019-04-06 DIAGNOSIS — J189 Pneumonia, unspecified organism: Secondary | ICD-10-CM

## 2019-04-06 LAB — RENAL FUNCTION PANEL
Albumin: 1.9 g/dL — ABNORMAL LOW (ref 3.5–5.0)
Anion gap: 13 (ref 5–15)
BUN: 34 mg/dL — ABNORMAL HIGH (ref 8–23)
CO2: 23 mmol/L (ref 22–32)
Calcium: 7.8 mg/dL — ABNORMAL LOW (ref 8.9–10.3)
Chloride: 95 mmol/L — ABNORMAL LOW (ref 98–111)
Creatinine, Ser: 3.93 mg/dL — ABNORMAL HIGH (ref 0.44–1.00)
GFR calc Af Amer: 13 mL/min — ABNORMAL LOW (ref >60)
GFR calc non Af Amer: 11 mL/min — ABNORMAL LOW (ref >60)
Glucose, Bld: 127 mg/dL — ABNORMAL HIGH (ref 70–99)
Phosphorus: 1.2 mg/dL — ABNORMAL LOW (ref 2.5–4.6)
Potassium: 4.1 mmol/L (ref 3.5–5.1)
Sodium: 131 mmol/L — ABNORMAL LOW (ref 135–145)

## 2019-04-06 LAB — CBC
HCT: 29.6 % — ABNORMAL LOW (ref 36.0–46.0)
Hemoglobin: 9.7 g/dL — ABNORMAL LOW (ref 12.0–15.0)
MCH: 27.9 pg (ref 26.0–34.0)
MCHC: 32.8 g/dL (ref 30.0–36.0)
MCV: 85.1 fL (ref 80.0–100.0)
Platelets: 164 10*3/uL (ref 150–400)
RBC: 3.48 MIL/uL — ABNORMAL LOW (ref 3.87–5.11)
RDW: 14.5 % (ref 11.5–15.5)
WBC: 12.4 10*3/uL — ABNORMAL HIGH (ref 4.0–10.5)
nRBC: 0 % (ref 0.0–0.2)

## 2019-04-06 LAB — GLUCOSE, CAPILLARY
Glucose-Capillary: 118 mg/dL — ABNORMAL HIGH (ref 70–99)
Glucose-Capillary: 118 mg/dL — ABNORMAL HIGH (ref 70–99)
Glucose-Capillary: 138 mg/dL — ABNORMAL HIGH (ref 70–99)
Glucose-Capillary: 183 mg/dL — ABNORMAL HIGH (ref 70–99)
Glucose-Capillary: 95 mg/dL (ref 70–99)

## 2019-04-06 LAB — MRSA PCR SCREENING: MRSA by PCR: NEGATIVE

## 2019-04-06 MED ORDER — CALCITRIOL 0.25 MCG PO CAPS: 0.2500 ug | ORAL_CAPSULE | ORAL | Status: DC

## 2019-04-06 MED ORDER — POTASSIUM & SODIUM PHOSPHATES 280-160-250 MG PO PACK
1.0000 | PACK | Freq: Three times a day (TID) | ORAL | Status: AC
  Administered 2019-04-06 (×3): 1 via ORAL
  Filled 2019-04-06 (×3): qty 1

## 2019-04-06 MED ORDER — INSULIN ASPART 100 UNIT/ML ~~LOC~~ SOLN
0.0000 [IU] | Freq: Three times a day (TID) | SUBCUTANEOUS | Status: DC
  Administered 2019-04-07: 1 [IU] via SUBCUTANEOUS
  Administered 2019-04-08: 2 [IU] via SUBCUTANEOUS
  Administered 2019-04-09: 3 [IU] via SUBCUTANEOUS
  Administered 2019-04-11: 2 [IU] via SUBCUTANEOUS

## 2019-04-06 MED ORDER — BENZONATATE 100 MG PO CAPS
100.0000 mg | ORAL_CAPSULE | Freq: Three times a day (TID) | ORAL | Status: DC
  Administered 2019-04-06 – 2019-04-07 (×4): 100 mg via ORAL
  Filled 2019-04-06 (×4): qty 1

## 2019-04-06 MED ORDER — CALCIUM CARBONATE 1250 (500 CA) MG PO TABS
500.0000 mg | ORAL_TABLET | Freq: Every day | ORAL | Status: DC
  Administered 2019-04-06 – 2019-04-18 (×12): 500 mg via ORAL
  Filled 2019-04-06 (×13): qty 1

## 2019-04-06 NOTE — Progress Notes (Signed)
Discussed with Dr.Ogbata regarding erroneous discharge to [facility] from ICU. IMT[facility] to take over care starting 04/07/19 7am

## 2019-04-06 NOTE — Progress Notes (Signed)
PROGRESS NOTE    Amber Dixon  [id number] DOB: 13-Aug-1950 DOA: 03/31/2019 PCP: Patient, No Pcp Per  Outpatient Specialists:   Brief Narrative:  Patient is a 68 year old Hispanic female with past medical history significant for diabetes mellitus, hypertension, CKD 5, progressed to end-stage renal disease.  Patient presented to the hospital on 03/31/2019 with chest pain and shortness of breath.  Patient was recently admitted to the hospital from 11/27-12/1 with worsening renal failure and anemia.  The initial plan was for patient to return to [place] for initiation of dialysis. After leaving hospital, patient felt better, however, a couple of days later began to experience progressive shortness breath. CXR revealed worsening bilateral patchy consolidations.  Patient was started on Rocephin/Azithromycin for possible CAP. Nephrology consulted, attempted to diuresis. However, on 12/9 required Vascath Placement. Underwent HD 12/9 however no UF pulled. Later in the evening went into respiratory distress requiring BiPAP. PCCM consulted, patient to ICU, Nephrology contacted who agreed to dialyze patient same night.  Patient is still requiring 15 L of supplemental oxygen despite having had hemodialysis.  Patient is also on treatment for possible community acquired pneumonia.  Patient has been transferred to the care of the hospitalist team.  [facility] Events   12/7 > Presents to ED  12/9 > Transferred to ICU for Respiratory Distress    Procedures:  [place] 12/9 >>  Significant Diagnostic Tests:  CXR 12/7 > Worsening bilateral pneumonia, right greater than left. Followup PA and lateral chest X-ray is recommended in 3-4 weeks following trial of antibiotic therapy to ensure resolution and exclude underlying Malignancy.  CXR 12/8 > The heart is enlarged. There are patchy infiltrates throughout the lungs bilaterally. This is a significant change compared to the  most recent comparison. The distribution of the opacities is somewhat heterogeneous, favoring infectious process over pulmonary edema but the rapid course suggest atypical pulmonary edema remains a consideration. There are bilateral pleural effusions.  Micro Data:  Blood 12/7 >   Antimicrobials:  Rocephin 12/7 > Azithromycin 12/7 >    04/05/2019: Patient seen alongside patient's son.  Patient is still requiring 15 L of supplemental oxygen.  We will proceed with repeat chest x-ray and ABG.  Management depend on hospital course.  04/06/2019: Patient seen alongside patient's son and nurse.  Patient continues to improve.  Patient is currently on 10 L of supplemental oxygen with O2 sat of 100%.  We will continue to wean off supplemental oxygen as tolerated.  Repeat chest x-ray done yesterday looks a lot better.  Hemodialysis is planned for tomorrow.  Leukocytosis is resolving.  No documented fever.  Mild hyponatremia noted, likely volume dependent.  Albumin of 1.9.  Consider consulting dietary team.  Low albumin may have prognostic implications.  Further management will depend on hospital course.  (The teaching service/internal medicine service will assume patient's care in the morning)  Assessment & Plan:   Active Problems:   Acute respiratory failure with hypoxemia (HCC)  Acute Hypoxic Respiratory Distress in setting of pulmonary edema/Volume Overload and pneumonia: -Patient is currently on 10 L/min of supplemental oxygen with O2 sat of 100%.   -Continue to wean down supplemental oxygen.   -Continue hemodialysis to optimize volume.   -Continue treatment for likely pneumonia.   -Overall, patient is improving.    Likely pneumonia:  -Leukocytosis is resolving.  WBC is 12.4 today.   -Last procalcitonin checked was 1.68, however, this in setting of chronic kidney disease/ESRD -Complete course of antibiotics. -Blood cultures done  on 03/31/2019 have not grown any organisms.   -I have not  visualized any pending sputum culture.  Will order sputum culture.   -Further management depend on hospital course.    ESRD with pulmonary edema/Volume Overload: -Nephrology team is directing care -Patient is not on hemodialysis. -For hemodialysis in the morning.  Hyponatremia: -Likely volume dependent -We will likely correct with hemodialysis and ultrafiltration.  Hypoalbuminemia/possible moderate malnutrition: -Consider dietary consult, especially, as patient is done on hemodialysis. -Low albumin may indicate guarded prognosis  Diabetes mellitus: -Continue to monitor blood sugar closely. -Blood sugar today have ranged from 95-183. -Hypoglycemia protocol as needed -Caloric count -SSI coverage   Anemia of Chronic Diease/CKD: -H/H is at goal -Hemoglobin is 9.7 g/dl.   Hypertension -Continue Norvasc 5 mg po once daily -Will likely continue to improve with Hemodialysis.  DVT prophylaxis: Subacute heparin Code Status: Full code Family Communication: Son Disposition Plan: This will depend on hospital course   Consultants:   Nephrology  Patient was under the care of critical care team.  Procedures:   Dialysis access placement  Antimicrobials:   Currently on only IV Rocephin   Subjective: No fever or chills Reports Mild cough productive of whitish sputum  Objective: Vitals:   04/06/19 1200 04/06/19 1300 04/06/19 1400 04/06/19 1500  BP: (!) 146/77 (!) 151/70 (!) 149/72 (!) 148/68  Pulse: 90 92 88 87  Resp: (!) 26 (!) 26 (!) 24 20  Temp:      TempSrc:      SpO2: 96% 97% 100% 98%  Weight:        Intake/Output Summary (Last 24 hours) at 04/06/2019 1558 Last data filed at 04/06/2019 1400 Gross per 24 hour  Intake 340 ml  Output 0 ml  Net 340 ml   Filed Weights   04/03/19 1630 04/04/19 1250 04/04/19 1626  Weight: 31.6 kg 31.5 kg 29.9 kg    Examination:  General exam: Appears calm and comfortable.  Tiny lady Respiratory system: Improved air  entry. Cardiovascular system: S1 & S2 heard Gastrointestinal system: Abdomen is nondistended, soft and nontender. No organomegaly or masses felt. Normal bowel sounds heard. Central nervous system: Awake and alert.  Patient moves all extremities.   Extremities: No leg edema  Data Reviewed: I have personally reviewed following labs and imaging studies  CBC: Recent Labs  Lab 04/02/19 0244 04/03/19 0350 04/04/19 0540 04/05/19 1032 04/05/19 1306 04/06/19 0322  WBC 22.0* 25.7* 21.0* 16.1*  --  12.4*  HGB 10.4* 10.0* 10.5* 10.2* 10.2* 9.7*  HCT 31.3* 28.7* 32.7* 31.0* 30.0* 29.6*  MCV 83.9 79.9* 86.7 85.4  --  85.1  PLT 293 273 253 192  --  123456   Basic Metabolic Panel: Recent Labs  Lab 04/02/19 0244 04/03/19 0350 04/04/19 0531 04/05/19 1032 04/05/19 1306 04/06/19 0322  NA 127* 138 137 131* 127* 131*  K 3.9 3.4* 2.8* 4.0 4.0 4.1  CL 93* 99 98 94*  --  95*  CO2 11* 23 21* 20*  --  23  GLUCOSE 95 78 90 171*  --  127*  BUN 95* 11 10 24*  --  34*  CREATININE 9.46* 2.52* 2.70* 3.05*  --  3.93*  CALCIUM 6.6* 7.5* 8.1* 8.1*  --  7.8*  MG  --  2.0  --  2.0  --   --   PHOS 10.2* 2.4* 3.1 2.1*  --  1.2*   GFR: CrCl cannot be calculated (Unknown ideal weight.). Liver Function Tests: Recent Labs  Lab 03/31/19  0840 04/01/19 0241 04/02/19 0244 04/04/19 0531 04/06/19 0322  AST 28  --   --   --   --   ALT 13  --   --   --   --   ALKPHOS 149*  --   --   --   --   BILITOT 0.8  --   --   --   --   PROT 6.1*  --   --   --   --   ALBUMIN 2.5* 1.8* 2.2* 2.3* 1.9*   Recent Labs  Lab 03/31/19 0840  LIPASE 38   No results for input(s): AMMONIA in the last 168 hours. Coagulation Profile: No results for input(s): INR, PROTIME in the last 168 hours. Cardiac Enzymes: No results for input(s): CKTOTAL, CKMB, CKMBINDEX, TROPONINI in the last 168 hours. BNP (last 3 results) No results for input(s): PROBNP in the last 8760 hours. HbA1C: No results for input(s): HGBA1C in the last 72  hours. CBG: Recent Labs  Lab 04/05/19 2006 04/05/19 2356 04/06/19 0332 04/06/19 0746 04/06/19 1139  GLUCAP 237* 131* 118* 95 183*   Lipid Profile: No results for input(s): CHOL, HDL, LDLCALC, TRIG, CHOLHDL, LDLDIRECT in the last 72 hours. Thyroid Function Tests: No results for input(s): TSH, T4TOTAL, FREET4, T3FREE, THYROIDAB in the last 72 hours. Anemia Panel: No results for input(s): VITAMINB12, FOLATE, FERRITIN, TIBC, IRON, RETICCTPCT in the last 72 hours. Urine analysis:    Component Value Date/Time   COLORURINE YELLOW 03/31/2019 1345   APPEARANCEUR CLOUDY (A) 03/31/2019 1345   LABSPEC 1.014 03/31/2019 1345   PHURINE 6.0 03/31/2019 1345   GLUCOSEU 50 (A) 03/31/2019 1345   HGBUR SMALL (A) 03/31/2019 1345   BILIRUBINUR NEGATIVE 03/31/2019 1345   KETONESUR 5 (A) 03/31/2019 1345   PROTEINUR >=300 (A) 03/31/2019 1345   NITRITE NEGATIVE 03/31/2019 1345   LEUKOCYTESUR NEGATIVE 03/31/2019 1345   Sepsis Labs: @LABRCNTIP (procalcitonin:4,lacticidven:4)  ) Recent Results (from the past 240 hour(s))  SARS CORONAVIRUS 2 (TAT 6-24 HRS) Nasopharyngeal Nasopharyngeal Swab     Status: None   Collection Time: 03/31/19  9:11 AM   Specimen: Nasopharyngeal Swab  Result Value Ref Range Status   SARS Coronavirus 2 NEGATIVE NEGATIVE Final    Comment: (NOTE) SARS-CoV-2 target nucleic acids are NOT DETECTED. The SARS-CoV-2 RNA is generally detectable in upper and lower respiratory specimens during the acute phase of infection. Negative results do not preclude SARS-CoV-2 infection, do not rule out co-infections with other pathogens, and should not be used as the sole basis for treatment or other patient management decisions. Negative results must be combined with clinical observations, patient history, and epidemiological information. The expected result is Negative. Fact Sheet for Patients: SugarRoll.be Fact Sheet for Healthcare  Providers: https://www.woods-mathews.com/ This test is not yet approved or cleared by the [place] FDA and  has been authorized for detection and/or diagnosis of SARS-CoV-2 by FDA under an Emergency Use Authorization (EUA). This EUA will remain  in effect (meaning this test can be used) for the duration of the COVID-19 declaration under Section 56 4(b)(1) of the Act, 21 U.S.C. section 360bbb-3(b)(1), unless the authorization is terminated or revoked sooner. Performed at [facility], [place] [address] [place], [place] [address]   Blood Culture (routine x 2)     Status: None   Collection Time: 03/31/19  9:15 AM   Specimen: BLOOD  Result Value Ref Range Status   Specimen Description BLOOD RIGHT ANTECUBITAL  Final   Special Requests  Final    BOTTLES DRAWN AEROBIC AND ANAEROBIC Blood Culture adequate volume   Culture   Final    NO GROWTH 5 DAYS Performed at [facility], [place] [address] [place], [place] [address]    Report Status 04/05/2019 FINAL  Final  Blood Culture (routine x 2)     Status: None   Collection Time: 03/31/19  1:09 PM   Specimen: BLOOD  Result Value Ref Range Status   Specimen Description BLOOD LEFT ANTECUBITAL  Final   Special Requests   Final    BOTTLES DRAWN AEROBIC ONLY Blood Culture results may not be optimal due to an inadequate volume of blood received in culture bottles   Culture   Final    NO GROWTH 5 DAYS Performed at [facility], [place] [address], [place], [place] [address]    Report Status 04/05/2019 FINAL  Final         Radiology Studies: DG CHEST PORT 1 VIEW  Result Date: 04/05/2019 CLINICAL DATA:  Respiratory failure EXAM: PORTABLE CHEST 1 VIEW COMPARISON:  04/03/2019 FINDINGS: Right IJ dialysis catheter terminates in the right atrium as before. Heart size remains enlarged. Diffuse interstitial and airspace opacities are improved compared to previous exam accounting for differences in technique. No new areas of  dense consolidation. No acute bone process. IMPRESSION: 1. Stable appearance of the right IJ dialysis catheter. 2. Improved appearance of extensive edema or pneumonia compared to prior studies. Still with diffuse interstitial and airspace opacification. Electronically Signed   By: Zetta Bills M.D.   On: 04/05/2019 14:32        Scheduled Meds: . amLODipine  5 mg Oral Daily  . benzonatate  100 mg Oral TID  . [START ON 04/07/2019] calcitRIOL  0.25 mcg Oral Q M,W,F-HD  . calcium carbonate  500 mg of elemental calcium Oral Q supper  . Chlorhexidine Gluconate Cloth  6 each Topical Q0600  . famotidine  20 mg Oral Daily  . heparin  5,000 Units Subcutaneous Q12H  . mouth rinse  15 mL Mouth Rinse BID  . potassium & sodium phosphates  1 packet Oral TID WC & HS   Continuous Infusions: . dexmedetomidine (PRECEDEX) IV infusion 1.102 mcg/kg/hr (04/03/19 1241)     LOS: 6 days    Time spent: 35 minutes   Dana Allan, MD  [facility] Pager #: [phone] 7PM-7AM contact night coverage as above

## 2019-04-06 NOTE — Progress Notes (Signed)
Patient ID: Rosse Knipe, female   DOB: 09/03/1950, 68 y.o.   MRN: [id number]  [facility] Progress Note   Assessment/ Plan:   1.  Hypoxic respiratory failure: Appears to be bifactorial from pneumonia/pulmonary edema.  With net negative fluid balance but continued oxygen dependency likely reflecting more of an infiltrative/infectious etiology than entirely from pulmonary edema.  Next hemodialysis tomorrow.  Will order benzonatate. 2. ESRD: With unrecognized chronic kidney disease that has progressed on now to end-stage renal disease with severe azotemia.  Started on hemodialysis last week after unresponsive to maximize medical management with the plan to try and stabilize her from a medical standpoint to withstand transportation back to [place] where she is a citizen.  Will order for her next hemodialysis again tomorrow. 3. Anemia: Hemoglobin and hematocrit are stable at this time without any overt loss. 4. CKD-MBD: Low phosphorus level with ongoing binder/dialysis, status post replacement.  Discontinue binder and start nightly calcium supplement.  PTH is at goal for CKD 5/ESRD, begin low-dose calcitriol. 5.  Hyponatremia: Secondary to impaired water handling/nonosmotic release of ADH from pulmonary process.  Monitor with dialysis. 6. Hypertension: Blood pressure marginally elevated, monitor with hemodialysis.  Interacted with patient using online translation service available bedside.  Subjective:   She complains of cough that has limited her ability to rest overnight, otherwise denies any complaints.   Objective:   BP (!) 145/72   Pulse 92   Temp 97.6 F (36.4 C) (Axillary)   Resp (!) 27   Wt 29.9 kg   SpO2 100%   Physical Exam: Gen: Appears comfortable resting in bed, oxygen via high flow nasal cannula CVS: Pulse regular rhythm, normal rate, S1 and S2 normal Resp: Coarse breath sounds bilaterally, no distinct rales or rhonchi Abd: Soft, flat, nontender,  bowel sounds normal Ext: No lower extremity edema  Labs: BMET Recent Labs  Lab 03/31/19 2135 04/01/19 0241 04/02/19 0244 04/03/19 0350 04/04/19 0531 04/05/19 1032 04/05/19 1306 04/06/19 0322  NA 122* 123* 127* 138 137 131* 127* 131*  K 4.6 4.5 3.9 3.4* 2.8* 4.0 4.0 4.1  CL 92* 93* 93* 99 98 94*  --  95*  CO2 14* 13* 11* 23 21* 20*  --  23  GLUCOSE 59* 108* 95 78 90 171*  --  127*  BUN 91* 90* 95* 11 10 24*  --  34*  CREATININE 9.21* 8.84* 9.46* 2.52* 2.70* 3.05*  --  3.93*  CALCIUM 6.3* 6.1* 6.6* 7.5* 8.1* 8.1*  --  7.8*  PHOS  --  8.6* 10.2* 2.4* 3.1 2.1*  --  1.2*   CBC Recent Labs  Lab 04/03/19 0350 04/04/19 0540 04/05/19 1032 04/05/19 1306 04/06/19 0322  WBC 25.7* 21.0* 16.1*  --  12.4*  HGB 10.0* 10.5* 10.2* 10.2* 9.7*  HCT 28.7* 32.7* 31.0* 30.0* 29.6*  MCV 79.9* 86.7 85.4  --  85.1  PLT 273 253 192  --  164      Medications:    . amLODipine  5 mg Oral Daily  . azithromycin  250 mg Oral Daily  . calcium acetate  667 mg Oral TID WC  . Chlorhexidine Gluconate Cloth  6 each Topical Q0600  . famotidine  20 mg Oral Daily  . heparin  5,000 Units Subcutaneous Q12H  . mouth rinse  15 mL Mouth Rinse BID  . potassium & sodium phosphates  1 packet Oral TID WC & HS   Elmarie Shiley, MD 04/06/2019, 8:37 AM

## 2019-04-06 NOTE — Evaluation (Signed)
Clinical/Bedside Swallow Evaluation Patient Details  Name: Amber Dixon MRN: [id number] Date of Birth: January 10, 1951  Today's Date: 04/06/2019 Time: SLP Start Time (ACUTE ONLY): 6 SLP Stop Time (ACUTE ONLY): 1027 SLP Time Calculation (min) (ACUTE ONLY): 22 min  Past Medical History:  Past Medical History:  Diagnosis Date  . Diabetes mellitus without complication ([place])   . Hypertension    Past Surgical History:  Past Surgical History:  Procedure Laterality Date  . IR FLUORO GUIDE CV LINE RIGHT  04/02/2019  . IR US GUIDE VASC ACCESS RIGHT  04/02/2019  . LEG SURGERY     HPI:  68 year old female with presents to ED on 12/7 with chest pain and shortness of breath. Recent admission 11/27-12/1 for worsening renal failure and anemia. Planned to return to [place] for initiation of dialysis. After leaving hospital patient felt better, however a couple of days later began to experience progressive shortness breath. Initial CXR with worsening bilateral patchy consolidations. Started on Rocephin/Azithromycin for possible CAP. Nephrology consulted. Planned to attempted to diuresis. However on 12/9 required Vascath Placement. Underwent HD 12/9 however no UF pulled. Later in the evening went into respiratory distress requiring BiPAP. 04/05/19 CXR revealed Improved appearance of extensive edema or pneumonia compared to prior studies. Still with diffuse interstitial and airspace opacification.  Assessment / Plan / Recommendation Clinical Impression  Pt with oropharyngeal dysphagia characterized by decreased mastication/bolus manipulation and propulsion with delayed cough noted with puree consistency; pt stated this was due to "mouth being dry" during intake; xerostomia present during OME and small midline lingual growth noted; pt denied difficulty with dysphagia d/t growth and stated "she had this since she was a child"; dry coughing noted prior to intake; respiratory status improved since  yesterday as pt has been weaned to 10L from previous 15L HFNC; recommend Dysphagia 2/thin liquid diet with general swallowing precautions in place for safety purposes; ST will f/u for diet tolerance and education re: safety with swallowing; thank you for this consult.  SLP Visit Diagnosis: Dysphagia, oropharyngeal phase (R13.12)    Aspiration Risk  Mild aspiration risk    Diet Recommendation   Dysphagia 2/thin liquids  Medication Administration: Whole meds with puree    Other  Recommendations Oral Care Recommendations: Oral care BID   Follow up Recommendations Other (comment)(TBD)      Frequency and Duration min 2x/week  1 week       Prognosis Prognosis for Safe Diet Advancement: Good      Swallow Study   General Date of Onset: 03/31/19 HPI: 68 year old female with presents to ED on 12/7 with chest pain and shortness of breath. Recent admission 11/27-12/1 for worsening renal failure and anemia. Planned to return to [place] for initiation of dialysis. After leaving hospital patient felt better, however a couple of days later began to experience progressive shortness breath. CXR with worsening bilateral patchy consolidations. Started on Rocephin/Azithromycin for possible CAP. Nephrology consulted. Planned to attempted to diuresis. However on 12/9 required Vascath Placement. Underwent HD 12/9 however no UF pulled. Later in the evening went into respiratory distress requiring BiPAP. Type of Study: Bedside Swallow Evaluation Diet Prior to this Study: Dysphagia 1 (puree);Thin liquids Temperature Spikes Noted: Yes(low grade (99)) Respiratory Status: Nasal cannula;Other (comment)(10L) History of Recent Intubation: No Behavior/Cognition: Alert;Cooperative;Requires cueing;Other (Comment)(used interpreter services) Oral Cavity Assessment: Dry Oral Care Completed by SLP: Yes Oral Cavity - Dentition: Edentulous Self-Feeding Abilities: Needs assist Patient Positioning: Upright in bed Baseline  Vocal Quality: Low vocal  intensity Volitional Cough: Strong Volitional Swallow: Able to elicit    Oral/Motor/Sensory Function Overall Oral Motor/Sensory Function: Within functional limits   Ice Chips Ice chips: Within functional limits Presentation: Spoon   Thin Liquid Thin Liquid: Within functional limits Presentation: Cup    Nectar Thick Nectar Thick Liquid: Not tested   Honey Thick Honey Thick Liquid: Not tested   Puree Puree: Impaired Presentation: Spoon Pharyngeal Phase Impairments: Cough - Delayed   Solid     Solid: Impaired Presentation: Self Fed Oral Phase Impairments: Impaired mastication;Reduced lingual movement/coordination Oral Phase Functional Implications: Impaired mastication;Prolonged oral transit      Elvina Sidle, M.S., CCC-SLP 04/06/2019,12:35 PM

## 2019-04-07 DIAGNOSIS — Z794 Long term (current) use of insulin: Secondary | ICD-10-CM

## 2019-04-07 DIAGNOSIS — E8770 Fluid overload, unspecified: Secondary | ICD-10-CM

## 2019-04-07 DIAGNOSIS — R05 Cough: Secondary | ICD-10-CM

## 2019-04-07 LAB — CBC WITH DIFFERENTIAL/PLATELET
Abs Immature Granulocytes: 0.1 10*3/uL — ABNORMAL HIGH (ref 0.00–0.07)
Basophils Absolute: 0 10*3/uL (ref 0.0–0.1)
Basophils Relative: 0 %
Eosinophils Absolute: 0.8 10*3/uL — ABNORMAL HIGH (ref 0.0–0.5)
Eosinophils Relative: 11 %
HCT: 29.1 % — ABNORMAL LOW (ref 36.0–46.0)
Hemoglobin: 9.4 g/dL — ABNORMAL LOW (ref 12.0–15.0)
Immature Granulocytes: 1 %
Lymphocytes Relative: 10 %
Lymphs Abs: 0.7 10*3/uL (ref 0.7–4.0)
MCH: 27.9 pg (ref 26.0–34.0)
MCHC: 32.3 g/dL (ref 30.0–36.0)
MCV: 86.4 fL (ref 80.0–100.0)
Monocytes Absolute: 0.6 10*3/uL (ref 0.1–1.0)
Monocytes Relative: 8 %
Neutro Abs: 5.1 10*3/uL (ref 1.7–7.7)
Neutrophils Relative %: 70 %
Platelets: 130 10*3/uL — ABNORMAL LOW (ref 150–400)
RBC: 3.37 MIL/uL — ABNORMAL LOW (ref 3.87–5.11)
RDW: 14.4 % (ref 11.5–15.5)
WBC: 7.4 10*3/uL (ref 4.0–10.5)
nRBC: 0 % (ref 0.0–0.2)

## 2019-04-07 LAB — RENAL FUNCTION PANEL
Albumin: 1.7 g/dL — ABNORMAL LOW (ref 3.5–5.0)
Anion gap: 13 (ref 5–15)
BUN: 48 mg/dL — ABNORMAL HIGH (ref 8–23)
CO2: 21 mmol/L — ABNORMAL LOW (ref 22–32)
Calcium: 7.4 mg/dL — ABNORMAL LOW (ref 8.9–10.3)
Chloride: 96 mmol/L — ABNORMAL LOW (ref 98–111)
Creatinine, Ser: 5.52 mg/dL — ABNORMAL HIGH (ref 0.44–1.00)
GFR calc Af Amer: 8 mL/min — ABNORMAL LOW (ref >60)
GFR calc non Af Amer: 7 mL/min — ABNORMAL LOW (ref >60)
Glucose, Bld: 108 mg/dL — ABNORMAL HIGH (ref 70–99)
Phosphorus: 2.4 mg/dL — ABNORMAL LOW (ref 2.5–4.6)
Potassium: 4.4 mmol/L (ref 3.5–5.1)
Sodium: 130 mmol/L — ABNORMAL LOW (ref 135–145)

## 2019-04-07 LAB — GLUCOSE, CAPILLARY
Glucose-Capillary: 96 mg/dL (ref 70–99)
Glucose-Capillary: 148 mg/dL — ABNORMAL HIGH (ref 70–99)
Glucose-Capillary: 219 mg/dL — ABNORMAL HIGH (ref 70–99)

## 2019-04-07 LAB — MAGNESIUM: Magnesium: 2 mg/dL (ref 1.7–2.4)

## 2019-04-07 MED ORDER — ENSURE ENLIVE PO LIQD
237.0000 mL | Freq: Three times a day (TID) | ORAL | Status: DC
  Administered 2019-04-07 – 2019-04-25 (×40): 237 mL via ORAL

## 2019-04-07 MED ORDER — CALCITRIOL 0.25 MCG PO CAPS
0.2500 ug | ORAL_CAPSULE | Freq: Every day | ORAL | Status: DC
  Administered 2019-04-08 – 2019-04-09 (×2): 0.25 ug via ORAL
  Filled 2019-04-07 (×3): qty 1

## 2019-04-07 MED ORDER — BENZONATATE 100 MG PO CAPS
200.0000 mg | ORAL_CAPSULE | Freq: Three times a day (TID) | ORAL | Status: DC
  Administered 2019-04-07 – 2019-04-25 (×47): 200 mg via ORAL
  Filled 2019-04-07 (×49): qty 2

## 2019-04-07 MED ORDER — RENA-VITE PO TABS
1.0000 | ORAL_TABLET | Freq: Every day | ORAL | Status: DC
  Administered 2019-04-07 – 2019-04-24 (×18): 1 via ORAL
  Filled 2019-04-07 (×18): qty 1

## 2019-04-07 MED ORDER — CALCITRIOL 0.25 MCG PO CAPS
ORAL_CAPSULE | ORAL | Status: AC
  Administered 2019-04-07: 0.25 ug via ORAL
  Filled 2019-04-07: qty 1

## 2019-04-07 MED ORDER — HEPARIN SODIUM (PORCINE) 1000 UNIT/ML IJ SOLN
INTRAMUSCULAR | Status: AC
  Administered 2019-04-07: 1000 [IU] via INTRAVENOUS
  Filled 2019-04-07: qty 4

## 2019-04-07 NOTE — Progress Notes (Signed)
[facility] NEPHROLOGY PROGRESS NOTE  Assessment/ Plan: Pt is a 68 y.o. yo female with DM, HTN, presented with chest pain or shortness of breath, progressive CKD to now ESRD.  #Progressive CKD to ESRD: Started HD for severe azotemia and fluid overload.  Refractory to medical management.  Plan for HD today.  SW eval for OP HD.  May need permanent HD catheter depending on dispo plan.  #Acute respiratory failure with hypoxia/pulmonary edema: UF during HD.  #Anemia of CKD: Iron saturation 28%.  Start Aranesp during HD.  Monitor hemoglobin  #CKD-MBD: Calcium and phosphorus level low.  Saved K-Phos.  Change calcitriol to daily.  PTH 269, at goal for ESRD  #Hyponatremia: Hypervolemic.  Monitor in HD.  #Hypertension: Continue amlodipine.  Monitor blood pressure  Subjective: Seen and examined at bedside.  No new event. Objective Vital signs in last 24 hours: Vitals:   04/07/19 0700 04/07/19 0800 04/07/19 0900 04/07/19 1000  BP: (!) 150/68  (!) 153/72 134/71  Pulse: 93 98 (!) 104 92  Resp: (!) 30 (!) 21 (!) 32 (!) 25  Temp:  98.6 F (37 C)    TempSrc:  Oral    SpO2: 99% 100% 100% 100%  Weight:       Weight change:   Intake/Output Summary (Last 24 hours) at 04/07/2019 1037 Last data filed at 04/06/2019 1400 Gross per 24 hour  Intake 100 ml  Output --  Net 100 ml       Labs: Basic Metabolic Panel: Recent Labs  Lab 04/05/19 1032 04/05/19 1306 04/06/19 0322 04/07/19 0508  NA 131* 127* 131* 130*  K 4.0 4.0 4.1 4.4  CL 94*  --  95* 96*  CO2 20*  --  23 21*  GLUCOSE 171*  --  127* 108*  BUN 24*  --  34* 48*  CREATININE 3.05*  --  3.93* 5.52*  CALCIUM 8.1*  --  7.8* 7.4*  PHOS 2.1*  --  1.2* 2.4*   Liver Function Tests: Recent Labs  Lab 04/04/19 0531 04/06/19 0322 04/07/19 0508  ALBUMIN 2.3* 1.9* 1.7*   No results for input(s): LIPASE, AMYLASE in the last 168 hours. No results for input(s): AMMONIA in the last 168 hours. CBC: Recent Labs  Lab  04/03/19 0350 04/04/19 0540 04/05/19 1032 04/05/19 1306 04/06/19 0322 04/07/19 0508  WBC 25.7* 21.0* 16.1*  --  12.4* 7.4  NEUTROABS  --   --   --   --   --  5.1  HGB 10.0* 10.5* 10.2* 10.2* 9.7* 9.4*  HCT 28.7* 32.7* 31.0* 30.0* 29.6* 29.1*  MCV 79.9* 86.7 85.4  --  85.1 86.4  PLT 273 253 192  --  164 130*   Cardiac Enzymes: No results for input(s): CKTOTAL, CKMB, CKMBINDEX, TROPONINI in the last 168 hours. CBG: Recent Labs  Lab 04/06/19 0746 04/06/19 1139 04/06/19 1601 04/06/19 2342 04/07/19 0743  GLUCAP 95 183* 118* 138* 96    Iron Studies: No results for input(s): IRON, TIBC, TRANSFERRIN, FERRITIN in the last 72 hours. Studies/Results: DG CHEST PORT 1 VIEW  Result Date: 04/05/2019 CLINICAL DATA:  Respiratory failure EXAM: PORTABLE CHEST 1 VIEW COMPARISON:  04/03/2019 FINDINGS: Right IJ dialysis catheter terminates in the right atrium as before. Heart size remains enlarged. Diffuse interstitial and airspace opacities are improved compared to previous exam accounting for differences in technique. No new areas of dense consolidation. No acute bone process. IMPRESSION: 1. Stable appearance of the right IJ dialysis catheter. 2. Improved appearance of  extensive edema or pneumonia compared to prior studies. Still with diffuse interstitial and airspace opacification. Electronically Signed   By: Zetta Bills M.D.   On: 04/05/2019 14:32    Medications: Infusions: . dexmedetomidine (PRECEDEX) IV infusion 1.102 mcg/kg/hr (04/03/19 1241)    Scheduled Medications: . amLODipine  5 mg Oral Daily  . benzonatate  100 mg Oral TID  . calcitRIOL  0.25 mcg Oral Q M,W,F-HD  . calcium carbonate  500 mg of elemental calcium Oral Q supper  . Chlorhexidine Gluconate Cloth  6 each Topical Q0600  . famotidine  20 mg Oral Daily  . heparin  5,000 Units Subcutaneous Q12H  . insulin aspart  0-6 Units Subcutaneous TID WC  . mouth rinse  15 mL Mouth Rinse BID    have reviewed scheduled and prn  medications.  Physical Exam: General:NAD, comfortable Heart:RRR, s1s2 nl Lungs:clear b/l, no crackle Abdomen:soft, Non-tender, non-distended Extremities:No edema Dialysis Access: Right IJ temporary catheter placed by IR on 12/9.  Tykira Wachs Prasad Donnia Poplaski 04/07/2019,10:37 AM  LOS: 7 days  Pager: [phone]

## 2019-04-07 NOTE — Progress Notes (Signed)
Page Graciella Freer about pts CBG of 219. Will continue to assess.

## 2019-04-07 NOTE — Progress Notes (Signed)
Initial Nutrition Assessment  DOCUMENTATION CODES:   Not applicable  INTERVENTION:   Calorie Count in progress; based on results, if po intake remains inadequate, recommend insertion of Cortrak/NG tube with initiation of TF  Ensure Enlive po BID, each supplement provides 350 kcal and 20 grams of protein  Add Renal MVI daily   Magic cup BID with meals, each supplement provides 290 kcal and 9 grams of protein   NUTRITION DIAGNOSIS:   Inadequate oral intake related to poor appetite, acute illness as evidenced by meal completion < 25%.  GOAL:   Patient will meet greater than or equal to 90% of their needs  MONITOR:   PO intake, Supplement acceptance, Labs, Weight trends  REASON FOR ASSESSMENT:   Other (Comment)(Calorie Count)    ASSESSMENT:   68 yo female admitted with progressive CKD to ESRD requiring intiation of HD, acute respiratory failure with pulmonary edema. PMH includes DM, CKD, HTN  RD working remotely.  Recorded po intake 0-25% of meals. Calorie Count in progress  Current weight 30.7 kg; no height in system. Noted weight of 34.6 kg in September of 2019. Lowest weight this admission was 29.9 kg post iHD. Admit weight 35.5 kg. Net negative 2.6 L Estimated 13.5% wt loss in 1 year  Labs: sodium 130 (L), phosphorus 2.4 (L), iPTH 269 (at goal for HD) Meds: calcitriol, calcium carbonate, potassium and sodium phosphate ,ss novolog   Diet Order:   Diet Order            DIET - DYS 1 Room service appropriate? Yes; Fluid consistency: Thin  Diet effective now              EDUCATION NEEDS:   Not appropriate for education at this time  Skin:  Skin Assessment: Reviewed RN Assessment  Last BM:  12/14  Height:   Ht Readings from Last 1 Encounters:  No data found for Ht    Weight:   Wt Readings from Last 1 Encounters:  04/07/19 30.7 kg    BMI:  There is no height or weight on file to calculate BMI.  Estimated Nutritional Needs:   Kcal:  1200-1400  kcals  Protein:  60-70 g  Fluid:  1000 mL plus UOP   [employer] MS, RDN, LDN, CNSC [phone] Pager  [phone] Weekend/On-Call Pager

## 2019-04-07 NOTE — Progress Notes (Signed)
SLP Cancellation Note  Patient Details Name: Lakesa Harkless MRN: [id number] DOB: [birth year]   Cancelled treatment:       Reason Eval/Treat Not Completed: Patient at procedure or test/unavailable.  RN reported that pt is currently in hemodialysis.  SLP will f/u as schedule allows.     Elvia Collum Leata Dominy 04/07/2019, 2:39 PM

## 2019-04-07 NOTE — Progress Notes (Addendum)
Subjective:  [name] was seen at bedside this morning. Patient states that she is breathing ok, but does have a cough. Her chest pain has improved since being admitted. She is tolerating dialysis and denies any pain at this time. Her cough is new as of yesterday morning. She denies sputum production or hematemesis. Her cough is keeping her up at night. She denies abdominal pain, nausea, vomiting, or constipation. Has had at least 1-2 episodes of loose stools. She denies blood in BM. Tolerating AB well.   Objective:  Vital signs in last 24 hours: Vitals:   04/06/19 1946 04/06/19 2344 04/07/19 0333 04/07/19 0514  BP:      Pulse: 95 87 89   Resp: 20 (!) 22 (!) 27   Temp: 98.2 F (36.8 C) 98.3 F (36.8 C) 98.2 F (36.8 C)   TempSrc: Oral Axillary Axillary   SpO2: 97% 100% 100%   Weight:    30.8 kg   Physical Exam Vitals and nursing note reviewed.  Constitutional:      General: She is not in acute distress.    Appearance: Normal appearance. She is not ill-appearing or toxic-appearing.  HENT:     Head: Normocephalic and atraumatic.  Eyes:     General:        Right eye: No discharge.        Left eye: No discharge.     Conjunctiva/sclera: Conjunctivae normal.  Cardiovascular:     Rate and Rhythm: Normal rate and regular rhythm.     Pulses: Normal pulses.     Heart sounds: Normal heart sounds. No murmur. No friction rub. No gallop.   Pulmonary:     Effort: Pulmonary effort is normal.     Breath sounds: Normal breath sounds. No wheezing, rhonchi or rales.  Abdominal:     General: Bowel sounds are normal.     Palpations: Abdomen is soft.     Tenderness: There is no abdominal tenderness. There is no guarding.  Musculoskeletal:        General: No swelling.     Right lower leg: No edema.     Left lower leg: No edema.  Neurological:     General: No focal deficit present.     Mental Status: She is alert and oriented to person, place, and time.  Psychiatric:        Mood and  Affect: Mood normal.        Behavior: Behavior normal.     Assessment/Plan:  Active Problems:   Acute respiratory failure with hypoxemia [facility])  Amber Dixon is a 68 y/o female, with a PMH CKD5, HTN, DM, who presented to the hospital on 03/31/2019 with [facility] and chest pain. Originally, patient was D/C on 12/1 with worsening renal failure and anemia, was supposed to return to [place] to initiate dialysis. Patient returned to [facility] on 12/7 with [facility] and a CXR showed bilateral patchy consolidations. Patient started on Rocephin and Azithromycin for CAP. Nephrology was consulted for HD but patient had a hypotensive episode which halted HD. Dulcy Fanny was placed on 12/9 and underwent HD. Later on 12/9 patient went into respiratory distress and required BiPAP. Patient was transferred to the ICU and was dialyzed that same night. Patient required 15L of supplement O2, before and after being dialyzed. Patient has been weaned down to 2L O2 and returned to [facility] to resume care on 04/07/2019.    1. Hypoxic Respirator Distress 2/2 to Pulmonary Edema/Volume Overload: - Patient currently weaned down  to 2L/min of supplemental O2. Patient saturated at 100% Throughout interview.  - Continue to wean off supplemental O2.  - Continue intermittent hemodialysis  2. Community Acquired Pneumonia:  - Leukocytosis continues to improve with a count of 7.4 - Completed Ceftriaxone and Azithromycin - Blood culture: NGTD (Day 5) - Expectorated sputum assessment w rflx to resp culture: Pending  3. ESRD with Pulmonary Edema:  - Continue to F/U with Nephrology recommendations, we appreciate their assistance with Amber Dixon care.  - Patient receiving HD today - Ultimately, Patient's family has been notified that they need a nephrologist in [place] for their mother, upon discharge of the hospital for her return trip to [place].   4. Hyponatremia:  Hyponatremia likely 2/2 to volume overload, will continue to monitor if Na improves  with HD sessions.   5. Diabetes Mellitis:  - Glucose Range 96-108  - Continue to monitor glucose - Continue SSI  6. Acute Cough:   Patient complains of dry, non productive cough that has occurred for 2 days. Patient states that the cough does keep her up at night, and is consistent.  - Increased dose of benzonatate to 200 mg TID.   Dispo: Anticipated discharge pending medical course.  Maudie Mercury, MD [facility], PGY-1 Pager: [phone] 04/07/2019,5:55 AM

## 2019-04-07 NOTE — TOC Progression Note (Signed)
Transition of Care [facility]) - Progression Note    Patient Details  Name: Lizbeht Fratto MRN: [id number] Date of Birth: 05-02-1950  [facility]) CM/SW Contact  Bartholomew Crews, RN Phone Number: [phone] 04/07/2019, 2:51 PM  Clinical Narrative:    Patient awaiting progressive bed. HD this afternoon. Spoke with Development worker, community and faxed copy of passport and visa found in shadow chart. Patient still needs to sign copy of proxy to allow financial counselor to act as proxy for DSS. However, because patient is documented, it is unlikely that she will qualify for emergency Medicaid. Internet search for [place] and universal healthcare coverage indicates that patient should be covered in [place] for healthcare needs. Per MD note family has been made aware that they need to find a nephrologist in [place] and that patient needs to return to [place] after discharge in order to receive follow up for healthcare needs including dialysis. TOC team following for transition needs.    Expected Discharge Plan: Home/Self Care Barriers to Discharge: Continued Medical Work up  Expected Discharge Plan and Services Expected Discharge Plan: Home/Self Care                                               Social Determinants of Health (SDOH) Interventions    Readmission Risk Interventions No flowsheet data found.

## 2019-04-07 NOTE — Progress Notes (Addendum)
   04/07/19 2130  What Happened  Was fall witnessed? No  Was patient injured? No  Patient found on floor  Found by Staff-comment (RN)  Stated prior activity bathroom-unassisted  Follow Up  MD notified MD Koleen Distance  Time MD notified 2135  Family notified No - patient refusal  Additional tests No  Adult Fall Risk Assessment  Risk Factor Category (scoring not indicated) Fall has occurred during this admission (document High fall risk)  Patient Fall Risk Level High fall risk  Adult Fall Risk Interventions  Required Bundle Interventions *See Row Information* High fall risk - low, moderate, and high requirements implemented  Additional Interventions Camera surveillance (with patient/family notification & education)  Screening for Fall Injury Risk (To be completed on HIGH fall risk patients) - Assessing Need for Low Bed  Risk For Fall Injury- Low Bed Criteria Previous fall this admission  Will Implement Low Bed and Floor Mats Low bed contraindicated, floor mats in place  Specialty Low Bed Contraindicated Requires therapeutic low air loss mattress  Screening for Fall Injury Risk (To be completed on HIGH fall risk patients who do not meet crieteria for Low Bed) - Assessing Need for Floor Mats Only  Risk For Fall Injury- Criteria for Floor Mats Noncompliant with safety precautions  Will Implement Floor Mats Yes  Vitals  Pulse Rate 95  ECG Heart Rate 95  Resp 13  Oxygen Therapy  SpO2 90 %  Prior to fall this RN attempted to set bed alarm (even though her Fall risk score did not require it) and the bed said that the pt was too light for the bed alarm.  Post fall-Pt stated that she tried to go to the restroom. When she got to the edge of the bed she slid down onto the floor. Pt denies any pain, VSS. Will  Implement Telemonitor and continue to assess.

## 2019-04-08 DIAGNOSIS — I959 Hypotension, unspecified: Secondary | ICD-10-CM

## 2019-04-08 LAB — RENAL FUNCTION PANEL
Albumin: 1.9 g/dL — ABNORMAL LOW (ref 3.5–5.0)
Anion gap: 10 (ref 5–15)
BUN: 24 mg/dL — ABNORMAL HIGH (ref 8–23)
CO2: 28 mmol/L (ref 22–32)
Calcium: 7.2 mg/dL — ABNORMAL LOW (ref 8.9–10.3)
Chloride: 95 mmol/L — ABNORMAL LOW (ref 98–111)
Creatinine, Ser: 2.77 mg/dL — ABNORMAL HIGH (ref 0.44–1.00)
GFR calc Af Amer: 19 mL/min — ABNORMAL LOW (ref >60)
GFR calc non Af Amer: 17 mL/min — ABNORMAL LOW (ref >60)
Glucose, Bld: 147 mg/dL — ABNORMAL HIGH (ref 70–99)
Phosphorus: 2.1 mg/dL — ABNORMAL LOW (ref 2.5–4.6)
Potassium: 3.7 mmol/L (ref 3.5–5.1)
Sodium: 133 mmol/L — ABNORMAL LOW (ref 135–145)

## 2019-04-08 LAB — CBC
HCT: 29.6 % — ABNORMAL LOW (ref 36.0–46.0)
Hemoglobin: 9.5 g/dL — ABNORMAL LOW (ref 12.0–15.0)
MCH: 27.2 pg (ref 26.0–34.0)
MCHC: 32.1 g/dL (ref 30.0–36.0)
MCV: 84.8 fL (ref 80.0–100.0)
Platelets: 134 10*3/uL — ABNORMAL LOW (ref 150–400)
RBC: 3.49 MIL/uL — ABNORMAL LOW (ref 3.87–5.11)
RDW: 14.3 % (ref 11.5–15.5)
WBC: 6.4 10*3/uL (ref 4.0–10.5)
nRBC: 0 % (ref 0.0–0.2)

## 2019-04-08 LAB — GLUCOSE, CAPILLARY
Glucose-Capillary: 124 mg/dL — ABNORMAL HIGH (ref 70–99)
Glucose-Capillary: 201 mg/dL — ABNORMAL HIGH (ref 70–99)
Glucose-Capillary: 139 mg/dL — ABNORMAL HIGH (ref 70–99)
Glucose-Capillary: 155 mg/dL — ABNORMAL HIGH (ref 70–99)

## 2019-04-08 MED ORDER — HEPARIN SODIUM (PORCINE) 5000 UNIT/ML IJ SOLN: 5000.0000 [IU] | Freq: Two times a day (BID) | INTRAMUSCULAR | Status: DC

## 2019-04-08 MED ORDER — CHLORHEXIDINE GLUCONATE CLOTH 2 % EX PADS: 6.0000 | MEDICATED_PAD | Freq: Every day | CUTANEOUS | Status: DC

## 2019-04-08 MED ORDER — DARBEPOETIN ALFA 40 MCG/0.4ML IJ SOSY
40.0000 ug | PREFILLED_SYRINGE | INTRAMUSCULAR | Status: DC
  Administered 2019-04-09: 40 ug via INTRAVENOUS
  Filled 2019-04-08 (×2): qty 0.4

## 2019-04-08 NOTE — Progress Notes (Signed)
SLP Cancellation Note  Patient Details Name: Shevonne Cassity MRN: [id number] DOB: [birth year]   Cancelled treatment:       Reason Eval/Treat Not Completed: Patient at procedure or test/unavailable, Pt NPO for procedure. Advised RN to order dys 2 diet when she returns from procedure. Has been eating well. Will f/u and determine ability to resume a more regular textured diet.   Herbie Baltimore, MA CCC-SLP  Acute Rehabilitation Services Pager [phone] Office [phone]  Lynann Beaver 04/08/2019, 9:42 AM

## 2019-04-08 NOTE — Progress Notes (Signed)
Chief Complaint: Patient was seen in consultation today for HD catheter  Referring Physician(s): Dr. Carolin Sicks  Supervising Physician: Corrie Mckusick  Patient Status: [facility] - In-pt  History of Present Illness: Amber Dixon is a 68 y.o. female with renal failure. She had temp cath placed on 12/9 and has tolerated HD so far. She will need long term durable HD access and IR is asked to place tunneled HD cath. PMHx, meds, labs, imaging, allergies reviewed. Feels well, no recent fevers, chills, illness. *Discussion via live video interpreter*  Past Medical History:  Diagnosis Date  . Diabetes mellitus without complication ([place])   . Hypertension     Past Surgical History:  Procedure Laterality Date  . IR FLUORO GUIDE CV LINE RIGHT  04/02/2019  . IR US GUIDE VASC ACCESS RIGHT  04/02/2019  . LEG SURGERY      Allergies: Patient has no known allergies.  Medications:  Current Facility-Administered Medications:  .  acetaminophen (TYLENOL) tablet 650 mg, 650 mg, Oral, Q6H PRN, Chundi, Vahini, MD, 650 mg at 04/05/19 1040 .  albuterol (PROVENTIL) (2.5 MG/3ML) 0.083% nebulizer solution 2.5 mg, 2.5 mg, Nebulization, Q6H PRN, Seawell, Jaimie A, DO, 2.5 mg at 04/01/19 0122 .  benzonatate (TESSALON) capsule 200 mg, 200 mg, Oral, TID, Maudie Mercury, MD, 200 mg at 04/08/19 0936 .  calcitRIOL (ROCALTROL) capsule 0.25 mcg, 0.25 mcg, Oral, Daily, Rosita Fire, MD, 0.25 mcg at 04/08/19 0936 .  calcium carbonate (OS-CAL - dosed in mg of elemental calcium) tablet 500 mg of elemental calcium, 500 mg of elemental calcium, Oral, Q supper, Elmarie Shiley, MD, 500 mg of elemental calcium at 04/07/19 1619 .  Chlorhexidine Gluconate Cloth 2 % PADS 6 each, 6 each, Topical, Q0600, Elmarie Shiley, MD, 6 each at 04/07/19 2130 .  [START ON 04/09/2019] Darbepoetin Alfa (ARANESP) injection 40 mcg, 40 mcg, Intravenous, Q Wed-HD, Rosita Fire, MD .  dextrose 50 % solution 25 mL, 25 mL,  Intravenous, PRN, Rush Farmer, MD .  famotidine (PEPCID) tablet 20 mg, 20 mg, Oral, Daily, Elmarie Shiley, MD, 20 mg at 04/08/19 3646 .  feeding supplement (ENSURE ENLIVE) (ENSURE ENLIVE) liquid 237 mL, 237 mL, Oral, TID BM, Bartholomew Crews, MD, 237 mL at 04/08/19 8032 .  heparin injection 1,000 Units, 1,000 Units, Intravenous, Q dialysis, Dwana Melena, MD, 1,000 Units at 04/07/19 1524 .  [START ON 04/09/2019] heparin injection 5,000 Units, 5,000 Units, Subcutaneous, Q12H, Willona Phariss, PA-C .  insulin aspart (novoLOG) injection 0-6 Units, 0-6 Units, Subcutaneous, TID WC, Dana Allan I, MD, 1 Units at 04/07/19 1619 .  MEDLINE mouth rinse, 15 mL, Mouth Rinse, BID, Dareen Piano, Nischal, MD, 15 mL at 04/08/19 0937 .  multivitamin (RENA-VIT) tablet 1 tablet, 1 tablet, Oral, QHS, Bartholomew Crews, MD, 1 tablet at 04/07/19 2152 .  ondansetron (ZOFRAN) injection 4 mg, 4 mg, Intravenous, Q6H PRN, Agyei, Obed K, MD, 4 mg at 04/06/19 0007 .  sodium chloride (OCEAN) 0.65 % nasal spray 1 spray, 1 spray, Each Nare, PRN, Rush Farmer, MD    No family history on file.  Social History   Socioeconomic History  . Marital status: Widowed    Spouse name: Not on file  . Number of children: Not on file  . Years of education: Not on file  . Highest education level: Not on file  Occupational History  . Not on file  Tobacco Use  . Smoking status: Never Smoker  . Smokeless tobacco: Never Used  Substance and Sexual Activity  . Alcohol use: Not on file  . Drug use: Not on file  . Sexual activity: Not on file  Other Topics Concern  . Not on file  Social History Narrative  . Not on file   Social Determinants of Health   Financial Resource Strain:   . Difficulty of Paying Living Expenses: Not on file  Food Insecurity:   . Worried About Charity fundraiser in the Last Year: Not on file  . Ran Out of Food in the Last Year: Not on file  Transportation Needs:   . Lack of Transportation  (Medical): Not on file  . Lack of Transportation (Non-Medical): Not on file  Physical Activity:   . Days of Exercise per Week: Not on file  . Minutes of Exercise per Session: Not on file  Stress:   . Feeling of Stress : Not on file  Social Connections:   . Frequency of Communication with Friends and Family: Not on file  . Frequency of Social Gatherings with Friends and Family: Not on file  . Attends Religious Services: Not on file  . Active Member of Clubs or Organizations: Not on file  . Attends Archivist Meetings: Not on file  . Marital Status: Not on file     Review of Systems: A 12 point ROS discussed and pertinent positives are indicated in the HPI above.  All other systems are negative.  Review of Systems  Vital Signs: BP (!) 105/46   Pulse 97   Temp 98.5 F (36.9 C) (Oral)   Resp (!) 29   Wt 29.6 kg   SpO2 92%   Physical Exam Constitutional:      General: She is not in acute distress.    Appearance: She is not ill-appearing.  HENT:     Mouth/Throat:     Mouth: Mucous membranes are moist.     Pharynx: Oropharynx is clear.  Neck:     Comments: (R)IJ trialysis intact, site clean Cardiovascular:     Rate and Rhythm: Normal rate and regular rhythm.     Heart sounds: Normal heart sounds.  Pulmonary:     Effort: Pulmonary effort is normal. No respiratory distress.     Breath sounds: Normal breath sounds.  Neurological:     General: No focal deficit present.     Mental Status: She is alert and oriented to person, place, and time.  Psychiatric:        Mood and Affect: Mood normal.        Thought Content: Thought content normal.        Judgment: Judgment normal.       Imaging: DG Chest 2 View  Result Date: 04/01/2019 CLINICAL DATA:  Reason for exam- SHOB. Pt recently dx with renal failure and was told to immediately start dialysis, pt did not take MD recommendations. Hx of HTN, Diabetes. EXAM: CHEST - 2 VIEW COMPARISON:  03/31/2019 FINDINGS: The  heart is enlarged. There are patchy infiltrates throughout the lungs bilaterally. This is a significant change compared to the most recent comparison. The distribution of the opacities is somewhat heterogeneous, favoring infectious process over pulmonary edema but the rapid course suggest atypical pulmonary edema remains a consideration. There are bilateral pleural effusions. IMPRESSION: 1. Findings consistent with congestive heart failure. 2. Superimposed infectious process is not excluded. Electronically Signed   By: Nolon Nations M.D.   On: 04/01/2019 12:33   DG Chest 2 View  Result Date: 03/21/2019  CLINICAL DATA:  Chest pain EXAM: CHEST - 2 VIEW COMPARISON:  Radiograph 03/21/2019 FINDINGS: Multifocal areas of interstitial and airspace opacity throughout the lungs. No pneumothorax. No visible effusion. Cardiomegaly with a calcified, tortuous aorta is similar to comparison. The osseous structures appear diffusely demineralized which may limit detection of small or nondisplaced fractures. No acute osseous or soft tissue abnormality. Degenerative changes are present in the imaged spine and shoulders. IMPRESSION: 1. Multifocal areas of interstitial and airspace opacity throughout the lungs suspicious for multifocal pneumonia or edema. 2. Stable cardiomegaly with calcified, tortuous aorta. 3. No pneumothorax or pleural effusion. Electronically Signed   By: Lovena Le M.D.   On: 03/21/2019 23:34   US RENAL  Result Date: 03/22/2019 CLINICAL DATA:  Acute renal failure EXAM: RENAL / URINARY TRACT ULTRASOUND COMPLETE COMPARISON:  CT 03/06/2018 FINDINGS: Right Kidney: Renal measurements: 7.9 x 3.4 x 3.5 cm = volume: 49 mL. Increased echotexture throughout the right kidney. No mass or hydronephrosis. Left Kidney: Renal measurements: 9.0 x 4.3 x 4.5 cm = volume: 89 mL. Increased echotexture throughout the left kidney. 1.6 cm complex cyst in the upper pole. 2.1 cm simple cyst in the lower pole. These are both  new since prior CT. No hydronephrosis. Bladder: Appears normal for degree of bladder distention. Other: Bilateral pleural effusions noted. IMPRESSION: Increased echotexture within the kidneys bilaterally compatible with chronic medical renal disease. 1.6 cm complex appearing cyst in the upper pole of the left kidney. This could be followed with repeat ultrasound in 6 months or further characterized with nonemergent contrast-enhanced CT or MRI. Electronically Signed   By: Rolm Baptise M.D.   On: 03/22/2019 00:02   IR Fluoro Guide CV Line Right  Result Date: 04/02/2019 INDICATION: End-stage renal disease. Please perform temporary dialysis catheter placement for the initiation of hemodialysis. EXAM: NON-TUNNELED CENTRAL VENOUS HEMODIALYSIS CATHETER PLACEMENT WITH ULTRASOUND AND FLUOROSCOPIC GUIDANCE COMPARISON:  None. MEDICATIONS: None FLUOROSCOPY TIME:  12 seconds (1.1 mGy) COMPLICATIONS: None immediate. PROCEDURE: Informed written consent was obtained from the patient after a discussion of the risks, benefits, and alternatives to treatment. Questions regarding the procedure were encouraged and answered. The right neck and chest were prepped with chlorhexidine in a sterile fashion, and a sterile drape was applied covering the operative field. Maximum barrier sterile technique with sterile gowns and gloves were used for the procedure. A timeout was performed prior to the initiation of the procedure. After the overlying soft tissues were anesthetized, a small venotomy incision was created and a micropuncture kit was utilized to access the internal jugular vein. Real-time ultrasound guidance was utilized for vascular access including the acquisition of a permanent ultrasound image documenting patency of the accessed vessel. The microwire was utilized to measure appropriate catheter length. A stiff glidewire was advanced to the level of the IVC. Under fluoroscopic guidance, the venotomy was serially dilated,  ultimately allowing placement of a 16 cm temporary Mah[place]kar dialysis catheter with tip ultimately terminating within the superior aspect of the right atrium. Final catheter positioning was confirmed and documented with a spot radiographic image. The catheter aspirates and flushes normally. The catheter was flushed with appropriate volume heparin dwells. The catheter exit site was secured with a 0-Prolene retention suture. A dressing was placed. The patient tolerated the procedure well without immediate post procedural complication. IMPRESSION: Successful placement of a right internal jugular approach 16 cm temporary dialysis catheter with tip terminating with in the superior aspect of the right atrium. The catheter is ready for immediate use.  PLAN: This catheter may be converted to a tunneled dialysis catheter at a later date as indicated. Electronically Signed   By: Sandi Mariscal M.D.   On: 04/02/2019 14:36   IR US Guide Vasc Access Right  Result Date: 04/02/2019 INDICATION: End-stage renal disease. Please perform temporary dialysis catheter placement for the initiation of hemodialysis. EXAM: NON-TUNNELED CENTRAL VENOUS HEMODIALYSIS CATHETER PLACEMENT WITH ULTRASOUND AND FLUOROSCOPIC GUIDANCE COMPARISON:  None. MEDICATIONS: None FLUOROSCOPY TIME:  12 seconds (1.1 mGy) COMPLICATIONS: None immediate. PROCEDURE: Informed written consent was obtained from the patient after a discussion of the risks, benefits, and alternatives to treatment. Questions regarding the procedure were encouraged and answered. The right neck and chest were prepped with chlorhexidine in a sterile fashion, and a sterile drape was applied covering the operative field. Maximum barrier sterile technique with sterile gowns and gloves were used for the procedure. A timeout was performed prior to the initiation of the procedure. After the overlying soft tissues were anesthetized, a small venotomy incision was created and a micropuncture kit was  utilized to access the internal jugular vein. Real-time ultrasound guidance was utilized for vascular access including the acquisition of a permanent ultrasound image documenting patency of the accessed vessel. The microwire was utilized to measure appropriate catheter length. A stiff glidewire was advanced to the level of the IVC. Under fluoroscopic guidance, the venotomy was serially dilated, ultimately allowing placement of a 16 cm temporary Mah[place]kar dialysis catheter with tip ultimately terminating within the superior aspect of the right atrium. Final catheter positioning was confirmed and documented with a spot radiographic image. The catheter aspirates and flushes normally. The catheter was flushed with appropriate volume heparin dwells. The catheter exit site was secured with a 0-Prolene retention suture. A dressing was placed. The patient tolerated the procedure well without immediate post procedural complication. IMPRESSION: Successful placement of a right internal jugular approach 16 cm temporary dialysis catheter with tip terminating with in the superior aspect of the right atrium. The catheter is ready for immediate use. PLAN: This catheter may be converted to a tunneled dialysis catheter at a later date as indicated. Electronically Signed   By: Sandi Mariscal M.D.   On: 04/02/2019 14:36   DG CHEST PORT 1 VIEW  Result Date: 04/05/2019 CLINICAL DATA:  Respiratory failure EXAM: PORTABLE CHEST 1 VIEW COMPARISON:  04/03/2019 FINDINGS: Right IJ dialysis catheter terminates in the right atrium as before. Heart size remains enlarged. Diffuse interstitial and airspace opacities are improved compared to previous exam accounting for differences in technique. No new areas of dense consolidation. No acute bone process. IMPRESSION: 1. Stable appearance of the right IJ dialysis catheter. 2. Improved appearance of extensive edema or pneumonia compared to prior studies. Still with diffuse interstitial and airspace  opacification. Electronically Signed   By: Zetta Bills M.D.   On: 04/05/2019 14:32   DG CHEST PORT 1 VIEW  Result Date: 04/03/2019 CLINICAL DATA:  Hypoxemia. EXAM: PORTABLE CHEST 1 VIEW COMPARISON:  Chest radiograph 04/01/2019 FINDINGS: Right IJ approach central venous catheter with tip projecting in the region of the right atrium. Overlying cardiac monitoring leads. Cardiomegaly. Extensive bilateral pulmonary opacities have progressed from prior examination. No sizable pleural effusion or evidence of pneumothorax. IMPRESSION: Right IJ approach central venous catheter with tip projecting in the region of the right atrium. Cardiomegaly. Extensive bilateral pulmonary opacities have progressed from prior chest radiograph 04/01/2019. This may reflect edema or pneumonia. Electronically Signed   By: Kellie Simmering DO   On: 04/03/2019 07:26  DG Chest Portable 1 View  Result Date: 03/31/2019 CLINICAL DATA:  Headache, vomiting and fever. EXAM: PORTABLE CHEST 1 VIEW COMPARISON:  03/21/2019. FINDINGS: Trachea is midline. Heart is enlarged, stable. Progressive patchy airspace consolidation bilaterally, right greater than left. No pleural fluid. IMPRESSION: Worsening bilateral pneumonia, right greater than left. Followup PA and lateral chest X-ray is recommended in 3-4 weeks following trial of antibiotic therapy to ensure resolution and exclude underlying malignancy. Electronically Signed   By: Lorin Picket M.D.   On: 03/31/2019 09:25   DG Chest Port 1 View  Result Date: 03/21/2019 CLINICAL DATA:  Chest pain today. EXAM: PORTABLE CHEST 1 VIEW COMPARISON:  PA and lateral chest 05/27/2017. FINDINGS: Patchy airspace disease is seen in the lung bases bilaterally, worse on the left. Heart size is enlarged. No pneumothorax or pleural effusion. No acute or focal bony abnormality. IMPRESSION: Patchy bibasilar airspace disease is worse on the left and could be due to atelectasis or pneumonia. Cardiomegaly.  Atherosclerosis. Electronically Signed   By: Inge Rise M.D.   On: 03/21/2019 16:43   ECHOCARDIOGRAM COMPLETE  Result Date: 03/22/2019   ECHOCARDIOGRAM REPORT   Patient Name:   Koren Bound Date of Exam: 03/22/2019 Medical Rec #:  [id number]       Height: Accession #:    [id number]      Weight:       76.2 lb Date of Birth:  04/29/1950        BSA:          1.06 m Patient Age:    67 years        BP:           160/78 mmHg Patient Gender: F               HR:           83 bpm. Exam Location:  Inpatient Procedure: 2D Echo Indications:    Congestive Heart Failure 428.0 / I50.9                 Dyspnea 786.09 / R06.00  History:        Patient has no prior history of Echocardiogram examinations.                 Chronic renal failure, anemia.  Sonographer:    Darlina Sicilian RDCS Referring Phys: [id number] [place]  1. Left ventricular ejection fraction, by visual estimation, is 55 to 60%. The left ventricle has normal function. There is borderline left ventricular hypertrophy.  2. Left ventricular diastolic parameters are consistent with Grade I diastolic dysfunction (impaired relaxation).  3. Global right ventricle has normal systolic function.The right ventricular size is normal. No increase in right ventricular wall thickness.  4. Left atrial size was mildly dilated.  5. Right atrial size was normal.  6. The mitral valve is normal in structure. Trace mitral valve regurgitation.  7. The tricuspid valve is normal in structure. Tricuspid valve regurgitation is trivial.  8. The aortic valve is normal in structure. Aortic valve regurgitation is trivial.  9. The pulmonic valve was normal in structure. Pulmonic valve regurgitation is not visualized. 10. Mildly elevated pulmonary artery systolic pressure. 11. The inferior vena cava is normal in size with <50% respiratory variability, suggesting right atrial pressure of 8 mmHg. FINDINGS  Left Ventricle: Left ventricular ejection fraction, by visual  estimation, is 55 to 60%. The left ventricle has normal function. There is borderline left ventricular hypertrophy. Left ventricular diastolic parameters  are consistent with Grade I diastolic  dysfunction (impaired relaxation). Right Ventricle: The right ventricular size is normal. No increase in right ventricular wall thickness. Global RV systolic function is has normal systolic function. The tricuspid regurgitant velocity is 2.97 m/s, and with an assumed right atrial pressure  of 3 mmHg, the estimated right ventricular systolic pressure is mildly elevated at 38.3 mmHg. Left Atrium: Left atrial size was mildly dilated. Right Atrium: Right atrial size was normal in size Pericardium: There is no evidence of pericardial effusion. Mitral Valve: The mitral valve is normal in structure. There is mild calcification of the mitral valve leaflet(s). Trace mitral valve regurgitation. Tricuspid Valve: The tricuspid valve is normal in structure. Tricuspid valve regurgitation is trivial. Aortic Valve: The aortic valve is normal in structure. Aortic valve regurgitation is trivial. Pulmonic Valve: The pulmonic valve was normal in structure. Pulmonic valve regurgitation is not visualized. Aorta: The aortic root and ascending aorta are structurally normal, with no evidence of dilitation. Venous: The inferior vena cava is normal in size with less than 50% respiratory variability, suggesting right atrial pressure of 8 mmHg. IAS/Shunts: No atrial level shunt detected by color flow Doppler.  LEFT VENTRICLE PLAX 2D LVIDd:         4.10 cm  Diastology LVIDs:         2.20 cm  LV e' lateral:   5.29 cm/s LV PW:         1.10 cm  LV E/e' lateral: 24.2 LV IVS:        1.10 cm  LV e' medial:    4.60 cm/s LVOT diam:     1.50 cm  LV E/e' medial:  27.8 LV SV:         58 ml LVOT Area:     1.77 cm  RIGHT VENTRICLE RV S prime:     12.50 cm/s TAPSE (M-mode): 1.3 cm LEFT ATRIUM             Index       RIGHT ATRIUM           Index LA diam:        3.60 cm  3.39 cm/m  RA Area:     10.90 cm LA Vol (A2C):   37.9 ml 35.71 ml/m RA Volume:   20.80 ml  19.60 ml/m LA Vol (A4C):   26.8 ml 25.25 ml/m LA Biplane Vol: 32.1 ml 30.25 ml/m  AORTIC VALVE LVOT Vmax:   98.90 cm/s LVOT Vmean:  68.500 cm/s LVOT VTI:    0.219 m  AORTA Ao Root diam: 2.10 cm MITRAL VALVE                         TRICUSPID VALVE MV Area (PHT): 9.37 cm              TR Peak grad:   35.3 mmHg MV PHT:        23.49 msec            TR Vmax:        297.00 cm/s MV Decel Time: 81 msec MV E velocity: 128.00 cm/s 103 cm/s  SHUNTS MV A velocity: 122.00 cm/s 70.3 cm/s Systemic VTI:  0.22 m MV E/A ratio:  1.05        1.5       Systemic Diam: 1.50 cm  Glori Bickers MD Electronically signed by Glori Bickers MD Signature Date/Time: 03/22/2019/12:07:35 PM    Final  VAS [place] LOWER EXTREMITY VENOUS (DVT)  Result Date: 03/22/2019  Lower Venous Study Indications: Swelling.  Limitations: Body habitus and poor ultrasound/tissue interface. Comparison Study: No prior study Performing Technologist: Maudry Mayhew MHA, RDMS, RVT, RDCS  Examination Guidelines: A complete evaluation includes B-mode imaging, spectral Doppler, color Doppler, and power Doppler as needed of all accessible portions of each vessel. Bilateral testing is considered an integral part of a complete examination. Limited examinations for reoccurring indications may be performed as noted.  +---------+---------------+---------+-----------+----------+--------------+ RIGHT    CompressibilityPhasicitySpontaneityPropertiesThrombus Aging +---------+---------------+---------+-----------+----------+--------------+ CFV      Full           No       Yes                                 +---------+---------------+---------+-----------+----------+--------------+ SFJ      Full                                                        +---------+---------------+---------+-----------+----------+--------------+ FV Prox  Full                                                         +---------+---------------+---------+-----------+----------+--------------+ FV Mid   Full                                                        +---------+---------------+---------+-----------+----------+--------------+ FV DistalFull                                                        +---------+---------------+---------+-----------+----------+--------------+ PFV      Full                                                        +---------+---------------+---------+-----------+----------+--------------+ POP      Full           No       Yes                                 +---------+---------------+---------+-----------+----------+--------------+ PTV      Full                                                        +---------+---------------+---------+-----------+----------+--------------+ PERO     Full                                                        +---------+---------------+---------+-----------+----------+--------------+   +---------+---------------+---------+-----------+----------+--------------+  LEFT     CompressibilityPhasicitySpontaneityPropertiesThrombus Aging +---------+---------------+---------+-----------+----------+--------------+ CFV      Full           No       Yes                                 +---------+---------------+---------+-----------+----------+--------------+ SFJ      Full                                                        +---------+---------------+---------+-----------+----------+--------------+ FV Prox  Full                                                        +---------+---------------+---------+-----------+----------+--------------+ FV Mid   Full                                                        +---------+---------------+---------+-----------+----------+--------------+ FV DistalFull                                                         +---------+---------------+---------+-----------+----------+--------------+ PFV      Full                                                        +---------+---------------+---------+-----------+----------+--------------+ POP      Full           No       Yes                                 +---------+---------------+---------+-----------+----------+--------------+ PTV      Full                                                        +---------+---------------+---------+-----------+----------+--------------+ PERO     Full                                                        +---------+---------------+---------+-----------+----------+--------------+   Summary: Right: There is no evidence of deep vein thrombosis in the lower extremity. No cystic structure found in the popliteal fossa. Left: There is no evidence of deep vein thrombosis in the lower extremity. No cystic structure found in the popliteal fossa.  Venous flow is pulsatile, suggestive of possible elevated right heart pressure. *See table(s) above for measurements and observations. Electronically signed by Curt Jews MD on 03/22/2019 at 9:22:49 AM.    Final     Labs:  CBC: Recent Labs    04/05/19 1032 04/05/19 1306 04/06/19 0322 04/07/19 0508 04/08/19 0438  WBC 16.1*  --  12.4* 7.4 6.4  HGB 10.2* 10.2* 9.7* 9.4* 9.5*  HCT 31.0* 30.0* 29.6* 29.1* 29.6*  PLT 192  --  164 130* 134*    COAGS: Recent Labs    03/21/19 1617  INR 1.1    BMP: Recent Labs    04/05/19 1032 04/05/19 1306 04/06/19 0322 04/07/19 0508 04/08/19 0438  NA 131* 127* 131* 130* 133*  K 4.0 4.0 4.1 4.4 3.7  CL 94*  --  95* 96* 95*  CO2 20*  --  23 21* 28  GLUCOSE 171*  --  127* 108* 147*  BUN 24*  --  34* 48* 24*  CALCIUM 8.1*  --  7.8* 7.4* 7.2*  CREATININE 3.05*  --  3.93* 5.52* 2.77*  GFRNONAA 15*  --  11* 7* 17*  GFRAA 17*  --  13* 8* 19*    LIVER FUNCTION TESTS: Recent Labs    03/21/19 1617 03/22/19 0500  03/31/19 0840 04/04/19 0531 04/06/19 0322 04/07/19 0508 04/08/19 0438  BILITOT 0.4 0.5 0.8  --   --   --   --   AST _0 --   --   --   --   ALT _1 --   --   --   --   ALKPHOS 127* 119 149*  --   --   --   --   PROT 5.5* 5.3* 6.1*  --   --   --   --   ALBUMIN 2.4* 2.3* 2.5* 2.3* 1.9* 1.7* 1.9*    TUMOR MARKERS: No results for input(s): AFPTM, CEA, CA199, CHROMGRNA in the last 8760 hours.  Assessment and Plan: Renal failure Plan for conversion of temp to tunneled HD cath, possibly today, schedule permitting. Labs reviewed. Risks and benefits of image guided tunneled HD catheter placement was discussed with the patient including, but not limited to bleeding, infection, pneumothorax, or fibrin sheath development and need for additional procedures.  All of the patient's questions were answered, patient is agreeable to proceed. Consent signed and in chart.    Thank you for this interesting consult.  I greatly enjoyed meeting Amber Dixon and look forward to participating in their care.  A copy of this report was sent to the requesting provider on this date.  Electronically Signed: Ascencion Dike, PA-C 04/08/2019, 10:27 AM   I spent a total of 20 minutes in face to face in clinical consultation, greater than 50% of which was counseling/coordinating care for HD cath

## 2019-04-08 NOTE — Progress Notes (Signed)
Today, we met with Amber Dixon's son, Amber Dixon, to discuss is mother's clinical course. We spoke with the assistance of an interpretor, and his RN, Amber Dixon, was a witness to the conversation. We spoke about the long term impact that ESRD can cause if not properly managed with Dialysis sessions, including the basic pathophysiology of ESRD. We also discussed the financial implications of paying out of pocket for HD sessions, which was not financially feasible for the family. Amber Dixon asked about waiting for the [facility] application result before discussing outpatient dialysis. Unfortunately, we discussed that his clam was denied due to his mother being a citizen of [place], so she would not qualify for Medicaid at this time.  We discussed the plan to have Amber Dixon to speak with his family today, to discuss finding a nephrologist in [place], as patient's cost would be covered under their universal healthcare. Amber Dixon stated that he and his brother have found a physician, and they will reach out to him today. We agreed to conclude the meeting today, have [place] speak with his brother and accepting physician today. We will reconvene tomorrow to discuss their findings.  Amber Mercury, MD [facility], PGY-1 Pager: [phone] 04/08/2019,4:29 PM

## 2019-04-08 NOTE — Progress Notes (Addendum)
Subjective:   Amber Dixon was examined and evaluated at bedside this AM. She was observed resting comfortably in bed. She states she is feeling well and has had no difficulty with her dialysis session. She mentions that her breathing is doing well and her frequent coughs have improved as well. She mentions that she would like to be discharged soon. Attempted to explain in detail her need for dialysis and need to go back to [place] as soon as possible but she appeared to have difficulty understanding the severity of her disease.  Objective:  Vital signs in last 24 hours: Vitals:   04/08/19 0300 04/08/19 0400 04/08/19 0500 04/08/19 0600  BP: 131/65 (!) 127/56 (!) 115/58 (!) 94/54  Pulse: 87 86 86 91  Resp: (!) 30 (!) 27 (!) 26 (!) 21  Temp:  98.3 F (36.8 C)    TempSrc:  Oral    SpO2: 96% 100% 94% 94%  Weight:      Physical Exam Vitals and nursing note reviewed.  Constitutional:      General: She is not in acute distress.    Appearance: Normal appearance. She is not ill-appearing or toxic-appearing.  HENT:     Head: Normocephalic and atraumatic.  Eyes:     General:        Right eye: No discharge.        Left eye: No discharge.     Conjunctiva/sclera: Conjunctivae normal.  Neck:     Comments: RIJ intact with no signs or erythema, discharge, or other signs of infection.  Cardiovascular:     Rate and Rhythm: Normal rate and regular rhythm.     Pulses: Normal pulses.     Heart sounds: Normal heart sounds. No murmur. No friction rub. No gallop.   Pulmonary:     Effort: Pulmonary effort is normal.     Breath sounds: Normal breath sounds. No wheezing, rhonchi or rales.  Abdominal:     General: Bowel sounds are normal.     Palpations: Abdomen is soft.     Tenderness: There is no abdominal tenderness. There is no guarding.  Musculoskeletal:        General: No swelling.     Right lower leg: No edema.     Left lower leg: No edema.  Neurological:     General: No focal deficit  present.     Mental Status: She is alert.  Psychiatric:        Mood and Affect: Mood normal.        Behavior: Behavior normal.     Assessment/Plan:  Active Problems:   Acute respiratory failure with hypoxemia [facility])  Amber Dixon is a 68 y/o female, with a PMH CKD5, HTN, DM, who presented to the hospital on 03/31/2019 with [facility] and chest pain. Originally, patient was D/C on 12/1 with worsening renal failure and anemia, was supposed to return to [place] to initiate dialysis. Patient returned to [facility] on 12/7 with [facility]: Lambert Rhonda W and a CXR showed bilateral patchy consolidations. Patient started on Rocephin and Azithromycin for CAP. Nephrology was consulted for HD but patient had a hypotensive episode which halted HD. Amber Dixon was placed on 12/9 and underwent HD. Later on 12/9 patient went into respiratory distress and required BiPAP. Patient was transferred to the ICU and was dialyzed that same night. Patient required 15L of supplement O2, before and after being dialyzed. Patient has been weaned down to 2L O2 and returned to [facility] to resume care on 04/07/2019.    1.  Hypoxic Respirator Distress 2/2 to Pulmonary Edema/Volume Overload: - Patient currently weaned down to 1L/min of supplemental O2 at 100% saturation. Trial of O2 withdrawal showed that patient dropped to 95% while speaking.  - Continue to wean off supplemental O2. Patient currently on 1L O2 flow [place].  2. Community Acquired Pneumonia:  - Leukocytosis continues to improve with a count of 6.4 - Completed Ceftriaxone and Azithromycin - Blood culture: NGTD (Day 6) - Expectorated sputum assessment w rflx to resp culture: Pending  3. ESRD with Pulmonary Edema:  - Continue to F/U with Nephrology recommendations, we appreciate their assistance. - HD session tolerated well.  - Radiology consulted by nephrology for HD catheter placement. We appreciate their assistance with Amber Dixon care. - Ultimately, Patient's family has been notified that they  need a nephrologist in [place] for their mother, upon discharge of the hospital for her return trip to [place]. Will speak with family when they arrive to the hospital today.   4. Hyponatremia:  Hyponatremia likely 2/2 to volume overload, will continue to monitor if Na improves with HD sessions.  - Na: 133  5. Diabetes Mellitis:  - Glucose Range 124-147 - Continue to monitor glucose - Continue SSI  6. Acute Cough:   Patient complains of dry, non productive cough that has occurred for 2 days. Patient states that the cough does keep her up at night, and is consistent. We increased her dose of benzonatate 200 mg TID, which she states has improved her coughing.  - Continue benzonatate to 200 mg TID.   7. Hypotension:  Overnight, Patient vitals had a downward trend in the systolic range from the 123456 down to 94/54. This trend could be due to her recent HD treatment. Will DC amlodipine at this time.   Dispo: Anticipated discharge pending medical course.  Maudie Mercury, MD [facility], PGY-1 Pager: [phone] 04/08/2019,6:27 AM

## 2019-04-08 NOTE — Progress Notes (Signed)
Report called to 2W RN 

## 2019-04-08 NOTE — Progress Notes (Signed)
Patient ID: Amber Dixon, female   DOB: [birth year], 68 y.o.   MRN: [id number]   Temp to tunneled catheter exchange in IR has been rescheduled to 12/16 secondary IR schedule  RN aware New orders placed

## 2019-04-08 NOTE — Progress Notes (Signed)
[facility]ESS [facility]  Assessment/ Plan: Pt is a 68 y.o. yo female with DM, HTN, presented with chest pain or shortness of breath, progressive CKD to now ESRD.  #Progressive CKD to ESRD: Started HD for severe azotemia and fluid overload.  Refractory to medical management.  Status post HD yesterday with 1421 cc UF.  Tolerated well.  Apparently, the plan for the patient is to find nephrologist in [place] and ultimately transferred here to continue her care.  Social worker and renal navigator following.  I  consulted IR for the placement of permanent catheter.  For now continue MWF schedule.  #Acute respiratory failure with hypoxia/pulmonary edema: UF during HD.  #Anemia of CKD: Iron saturation 28%.  Start Aranesp during HD.  Monitor hemoglobin  #CKD-MBD: Calcium and phosphorus level low.  Received K-Phos.  Continue calcitriol to daily.  PTH 269, at goal for ESRD.  Encourage oral intake  #Hyponatremia: Hypervolemic.  Monitor in HD.  #Hypertension: Continue amlodipine.  Monitor blood pressure  Subjective: Seen and examined at bedside.  No new event. Objective Vital signs in last 24 hours: Vitals:   04/08/19 0600 04/08/19 0700 04/08/19 0756 04/08/19 0800  BP: (!) 94/54 (!) 128/57  (!) 129/97  Pulse: 91 87  88  Resp: (!) 21 (!) 21  (!) 21  Temp:   98.5 F (36.9 C)   TempSrc:   Oral   SpO2: 94% 99%  96%  Weight:       Weight change: -0.1 kg  Intake/Output Summary (Last 24 hours) at 04/08/2019 0902 Last data filed at 04/08/2019 0800 Gross per 24 hour  Intake 600 ml  Output 1421 ml  Net -821 ml       Labs: Basic Metabolic Panel: Recent Labs  Lab 04/06/19 0322 04/07/19 0508 04/08/19 0438  NA 131* 130* 133*  K 4.1 4.4 3.7  CL 95* 96* 95*  CO2 23 21* 28  GLUCOSE 127* 108* 147*  BUN 34* 48* 24*  CREATININE 3.93* 5.52* 2.77*  CALCIUM 7.8* 7.4* 7.2*  PHOS 1.2* 2.4* 2.1*   Liver Function Tests: Recent Labs  Lab 04/06/19 0322 04/07/19 0508  04/08/19 0438  ALBUMIN 1.9* 1.7* 1.9*   No results for input(s): LIPASE, AMYLASE in the last 168 hours. No results for input(s): AMMONIA in the last 168 hours. CBC: Recent Labs  Lab 04/04/19 0540 04/05/19 1032 04/06/19 0322 04/07/19 0508 04/08/19 0438  WBC 21.0* 16.1* 12.4* 7.4 6.4  NEUTROABS  --   --   --  5.1  --   HGB 10.5* 10.2* 9.7* 9.4* 9.5*  HCT 32.7* 31.0* 29.6* 29.1* 29.6*  MCV 86.7 85.4 85.1 86.4 84.8  PLT 253 192 164 130* 134*   Cardiac Enzymes: No results for input(s): CKTOTAL, CKMB, CKMBINDEX, TROPONINI in the last 168 hours. CBG: Recent Labs  Lab 04/06/19 2342 04/07/19 0743 04/07/19 1601 04/07/19 2157 04/08/19 0755  GLUCAP 138* 96 148* 219* 124*    Iron Studies: No results for input(s): IRON, TIBC, TRANSFERRIN, FERRITIN in the last 72 hours. Studies/Results: No results found.  Medications: Infusions:   Scheduled Medications: . benzonatate  200 mg Oral TID  . calcitRIOL  0.25 mcg Oral Daily  . calcium carbonate  500 mg of elemental calcium Oral Q supper  . Chlorhexidine Gluconate Cloth  6 each Topical Q0600  . famotidine  20 mg Oral Daily  . feeding supplement (ENSURE ENLIVE)  237 mL Oral TID BM  . heparin  5,000 Units Subcutaneous Q12H  .  insulin aspart  0-6 Units Subcutaneous TID WC  . mouth rinse  15 mL Mouth Rinse BID  . multivitamin  1 tablet Oral QHS    have reviewed scheduled and prn medications.  Physical Exam: General:NAD, comfortable Heart:RRR, s1s2 nl Lungs:clear b/l, no crackle Abdomen:soft, Non-tender, non-distended Extremities:No edema Dialysis Access: Right IJ temporary catheter placed by IR on 12/9.  Garcia Dalzell Reesa Chew Yahsir Wickens 04/08/2019,9:02 AM  LOS: 8 days  Pager: [phone]

## 2019-04-09 LAB — CBC
HCT: 28.4 % — ABNORMAL LOW (ref 36.0–46.0)
Hemoglobin: 9.4 g/dL — ABNORMAL LOW (ref 12.0–15.0)
MCH: 27.8 pg (ref 26.0–34.0)
MCHC: 33.1 g/dL (ref 30.0–36.0)
MCV: 84 fL (ref 80.0–100.0)
Platelets: 168 10*3/uL (ref 150–400)
RBC: 3.38 MIL/uL — ABNORMAL LOW (ref 3.87–5.11)
RDW: 14 % (ref 11.5–15.5)
WBC: 8.6 10*3/uL (ref 4.0–10.5)
nRBC: 0 % (ref 0.0–0.2)

## 2019-04-09 LAB — RENAL FUNCTION PANEL
Albumin: 1.9 g/dL — ABNORMAL LOW (ref 3.5–5.0)
Anion gap: 14 (ref 5–15)
BUN: 49 mg/dL — ABNORMAL HIGH (ref 8–23)
CO2: 24 mmol/L (ref 22–32)
Calcium: 7.6 mg/dL — ABNORMAL LOW (ref 8.9–10.3)
Chloride: 92 mmol/L — ABNORMAL LOW (ref 98–111)
Creatinine, Ser: 4.97 mg/dL — ABNORMAL HIGH (ref 0.44–1.00)
GFR calc Af Amer: 10 mL/min — ABNORMAL LOW (ref >60)
GFR calc non Af Amer: 8 mL/min — ABNORMAL LOW (ref >60)
Glucose, Bld: 270 mg/dL — ABNORMAL HIGH (ref 70–99)
Phosphorus: 2.1 mg/dL — ABNORMAL LOW (ref 2.5–4.6)
Potassium: 4.3 mmol/L (ref 3.5–5.1)
Sodium: 130 mmol/L — ABNORMAL LOW (ref 135–145)

## 2019-04-09 LAB — GLUCOSE, CAPILLARY
Glucose-Capillary: 265 mg/dL — ABNORMAL HIGH (ref 70–99)
Glucose-Capillary: 117 mg/dL — ABNORMAL HIGH (ref 70–99)
Glucose-Capillary: 121 mg/dL — ABNORMAL HIGH (ref 70–99)
Glucose-Capillary: 325 mg/dL — ABNORMAL HIGH (ref 70–99)

## 2019-04-09 MED ORDER — DARBEPOETIN ALFA 40 MCG/0.4ML IJ SOSY
PREFILLED_SYRINGE | INTRAMUSCULAR | Status: AC
  Filled 2019-04-09: qty 0.4

## 2019-04-09 MED ORDER — LIDOCAINE HCL (PF) 1 % IJ SOLN: 5.0000 mL | INTRAMUSCULAR | Status: DC | PRN

## 2019-04-09 MED ORDER — INSULIN ASPART 100 UNIT/ML ~~LOC~~ SOLN
3.0000 [IU] | Freq: Once | SUBCUTANEOUS | Status: AC
  Administered 2019-04-09: 3 [IU] via SUBCUTANEOUS

## 2019-04-09 MED ORDER — PENTAFLUOROPROP-TETRAFLUOROETH EX AERO: 1.0000 "application " | INHALATION_SPRAY | CUTANEOUS | Status: DC | PRN

## 2019-04-09 MED ORDER — ALBUMIN HUMAN 25 % IV SOLN
25.0000 g | Freq: Once | INTRAVENOUS | Status: AC
  Administered 2019-04-09: 25 g via INTRAVENOUS

## 2019-04-09 MED ORDER — SODIUM CHLORIDE 0.9 % IV SOLN: 100.0000 mL | INTRAVENOUS | Status: DC | PRN

## 2019-04-09 MED ORDER — HEPARIN SODIUM (PORCINE) 1000 UNIT/ML IJ SOLN
INTRAMUSCULAR | Status: AC
  Filled 2019-04-09: qty 3

## 2019-04-09 MED ORDER — HEPARIN SODIUM (PORCINE) 1000 UNIT/ML DIALYSIS: 40.0000 [IU]/kg | INTRAMUSCULAR | Status: DC | PRN

## 2019-04-09 MED ORDER — ALTEPLASE 2 MG IJ SOLR: 2.0000 mg | Freq: Once | INTRAMUSCULAR | Status: DC | PRN

## 2019-04-09 MED ORDER — LIDOCAINE-PRILOCAINE 2.5-2.5 % EX CREA: 1.0000 "application " | TOPICAL_CREAM | CUTANEOUS | Status: DC | PRN

## 2019-04-09 MED ORDER — ALBUMIN HUMAN 25 % IV SOLN
INTRAVENOUS | Status: AC
  Filled 2019-04-09: qty 100

## 2019-04-09 MED ORDER — HEPARIN SODIUM (PORCINE) 1000 UNIT/ML DIALYSIS
1000.0000 [IU] | INTRAMUSCULAR | Status: DC | PRN
  Administered 2019-04-09: 2600 [IU] via INTRAVENOUS_CENTRAL

## 2019-04-09 MED ORDER — HEPARIN SODIUM (PORCINE) 1000 UNIT/ML DIALYSIS: 20.0000 [IU]/kg | INTRAMUSCULAR | Status: DC | PRN

## 2019-04-09 NOTE — Progress Notes (Signed)
SLP Cancellation Note  Patient Details Name: Trysha Metzgar MRN: [id number] DOB: 1950-09-28   Cancelled treatment:        HD catheter not placed yesterday and is scheduled for today. Currently NPO. Will follow.    Houston Siren 04/09/2019, 9:42 AM   Orbie Pyo Colvin Caroli.Ed Risk analyst [phone] Office [phone]

## 2019-04-09 NOTE — Progress Notes (Signed)
[facility] PROGRESS NOTE  Assessment/ Plan: Pt is a 68 y.o. yo female with DM, HTN, presented with chest pain or shortness of breath, progressive CKD to now ESRD.  #Progressive CKD to ESRD: Started HD for severe azotemia and fluid overload.  Refractory to medical management.  Receiving HD as MWF schedule.  Plan to convert temporary to permanent HD catheter today by IR today. Apparently, the plan for the patient is to find nephrologist in [place] and ultimately transferred there to continue her care.  Primary team discussed with the family member yesterday.  Social worker and renal navigator following.   #Acute respiratory failure with hypoxia/pulmonary edema: UF during HD.  Improved  #Anemia of CKD: Iron saturation 28%.  Continue Aranesp during HD.  Monitor hemoglobin  #CKD-MBD: Calcium and phosphorus level low.  Received K-Phos.  Continue calcitriol to daily.  PTH 269, at goal for ESRD.  Encourage oral intake  #Hyponatremia: Hypervolemic.  Monitor in HD.  #Hypertension: Continue amlodipine.  Monitor blood pressure  Subjective: Seen and examined at bedside.  No new event.  Plan for IR procedure today. Objective Vital signs in last 24 hours: Vitals:   04/08/19 2011 04/08/19 2321 04/09/19 0440 04/09/19 0720  BP: 128/63 132/69 129/64 136/71  Pulse: 88 90 86 98  Resp:  (!) 29 (!) 24 (!) 21  Temp: 99.3 F (37.4 C) 99.3 F (37.4 C) 99.5 F (37.5 C) 99.4 F (37.4 C)  TempSrc: Oral Oral Oral Oral  SpO2: 94% 99% 98% 96%  Weight:       Weight change:   Intake/Output Summary (Last 24 hours) at 04/09/2019 0835 Last data filed at 04/08/2019 1700 Gross per 24 hour  Intake 120 ml  Output --  Net 120 ml       Labs: Basic Metabolic Panel: Recent Labs  Lab 04/06/19 0322 04/07/19 0508 04/08/19 0438  NA 131* 130* 133*  K 4.1 4.4 3.7  CL 95* 96* 95*  CO2 23 21* 28  GLUCOSE 127* 108* 147*  BUN 34* 48* 24*  CREATININE 3.93* 5.52* 2.77*  CALCIUM 7.8*  7.4* 7.2*  PHOS 1.2* 2.4* 2.1*   Liver Function Tests: Recent Labs  Lab 04/06/19 0322 04/07/19 0508 04/08/19 0438  ALBUMIN 1.9* 1.7* 1.9*   No results for input(s): LIPASE, AMYLASE in the last 168 hours. No results for input(s): AMMONIA in the last 168 hours. CBC: Recent Labs  Lab 04/05/19 1032 04/06/19 0322 04/07/19 0508 04/08/19 0438 04/09/19 0802  WBC 16.1* 12.4* 7.4 6.4 8.6  NEUTROABS  --   --  5.1  --   --   HGB 10.2* 9.7* 9.4* 9.5* 9.4*  HCT 31.0* 29.6* 29.1* 29.6* 28.4*  MCV 85.4 85.1 86.4 84.8 84.0  PLT 192 164 130* 134* 168   Cardiac Enzymes: No results for input(s): CKTOTAL, CKMB, CKMBINDEX, TROPONINI in the last 168 hours. CBG: Recent Labs  Lab 04/08/19 0755 04/08/19 1118 04/08/19 1641 04/08/19 2053 04/09/19 0745  GLUCAP 124* 201* 139* 155* 265*    Iron Studies: No results for input(s): IRON, TIBC, TRANSFERRIN, FERRITIN in the last 72 hours. Studies/Results: No results found.  Medications: Infusions:   Scheduled Medications: . benzonatate  200 mg Oral TID  . calcitRIOL  0.25 mcg Oral Daily  . calcium carbonate  500 mg of elemental calcium Oral Q supper  . Chlorhexidine Gluconate Cloth  6 each Topical Q0600  . darbepoetin (ARANESP) injection - DIALYSIS  40 mcg Intravenous Q Wed-HD  . famotidine  20 mg  Oral Daily  . feeding supplement (ENSURE ENLIVE)  237 mL Oral TID BM  . insulin aspart  0-6 Units Subcutaneous TID WC  . mouth rinse  15 mL Mouth Rinse BID  . multivitamin  1 tablet Oral QHS    have reviewed scheduled and prn medications.  Physical Exam: General: Able to lie on bed, not in distress Heart:RRR, s1s2 nl Lungs: Clear b/l, no crackle Abdomen:soft, Non-tender, non-distended Extremities:No LE edema Dialysis Access: Right IJ temporary catheter placed by IR on 12/9.  Loisann Roach Prasad Amybeth Sieg 04/09/2019,8:35 AM  LOS: 9 days  Pager: [phone]

## 2019-04-09 NOTE — Progress Notes (Signed)
Nutrition Follow-up  DOCUMENTATION CODES:   Not applicable  INTERVENTION:   Continue Ensure Enlive po TID, each supplement provides 350 kcal and 20 grams of protein  Magic cup BID with meals, each supplement provides 290 kcal and 9 grams of protein  Continue Rena-Vit  Request for nursing to obtain pt's height  D/C Calorie count for now; re-order as appropriate   NUTRITION DIAGNOSIS:   Inadequate oral intake related to poor appetite, acute illness as evidenced by meal completion < 25%.  Being addressed via supplements  GOAL:   Patient will meet greater than or equal to 90% of their needs  Progressing  MONITOR:   PO intake, Supplement acceptance, Labs, Weight trends  REASON FOR ASSESSMENT:   Other (Comment)(Calorie Count)    ASSESSMENT:   68 yo female admitted with progressive CKD to ESRD requiring intiation of HD, acute respiratory failure with pulmonary edema. PMH includes DM, CKD, HTN  Calorie Count ordered over the weekend; no calorie count tickets found upon assessment today. Pt out of room at times of visit. However, pt was NPO yesterday part of the day and today for planned procedure which has been postponed until tomorrow. Calorie count is not helpful when pt is NPO for any portion of the day  Recorded po intake 0-50% of meals on average.   Plan for HD cath tomorrow  Current wt 30.5 kg pre HD; noted post HD weight of 29.6 kg on  Need pt's height  Labs: phosphorus 2.1 (L) Meds: aranesp, Rena-Vit  Diet Order:   Diet Order            Diet NPO time specified  Diet effective midnight        DIET DYS 2 Room service appropriate? Yes; Fluid consistency: Thin; Fluid restriction: 1200 mL Fluid  Diet effective now              EDUCATION NEEDS:   Not appropriate for education at this time  Skin:  Skin Assessment: Reviewed RN Assessment  Last BM:  12/14  Height:   Ht Readings from Last 1 Encounters:  No data found for Ht    Weight:   Wt  Readings from Last 1 Encounters:  04/09/19 30.5 kg    Ideal Body Weight:   unable to assess, height pending  BMI:  There is no height or weight on file to calculate BMI.  Estimated Nutritional Needs:   Kcal:  1200-1400 kcals  Protein:  60-70 g  Fluid:  1000 mL plus UOP   [employer] MS, RDN, LDN, CNSC [phone] Pager  [phone] Weekend/On-Call Pager

## 2019-04-09 NOTE — Progress Notes (Signed)
IR requested by Dr. Carolin Sicks for possible image-guided nontunneled to tunneled HD catheter conversion.  Patient drank an Ensure this AM. In addition, patient called for dialysis. Plan for image-guided nontunneled to tunneled HD catheter conversion in IR 04/10/2019. Patient to be NPO at midnight. Wilfred Curtis, RN aware.  Please call IR with questions/concerns.   Bea Graff Draven Natter, PA-C 04/09/2019, 11:03 AM

## 2019-04-09 NOTE — Progress Notes (Signed)
Pt not on BiPAP at this time, order states "as needed" pt tolerating well without BiPAP at this time. RT will continue to monitor.

## 2019-04-09 NOTE — Progress Notes (Signed)
Subjective:  Patient was seen this morning on rounds She states that she has no new symptoms overnight Her cough has improved. She does have a good appetite and would like to eat whenever possible. We discussed her procedure for a tunneled catheter and restarting her diet after the procedure. Patient was agreeable to the plan. All questions and concerns were addressed.  Objective:  Vital signs in last 24 hours: Vitals:   04/08/19 1632 04/08/19 2011 04/08/19 2321 04/09/19 0440  BP:  128/63 132/69 129/64  Pulse:  88 90 86  Resp:   (!) 29 (!) 24  Temp: 98.7 F (37.1 C) 99.3 F (37.4 C) 99.3 F (37.4 C) 99.5 F (37.5 C)  TempSrc: Oral Oral Oral Oral  SpO2:  94% 99% 98%  Weight:      Physical Exam Vitals and nursing note reviewed.  Constitutional:      General: She is not in acute distress.    Appearance: Normal appearance. She is not ill-appearing or toxic-appearing.  HENT:     Head: Normocephalic and atraumatic.  Eyes:     General:        Right eye: No discharge.        Left eye: No discharge.     Conjunctiva/sclera: Conjunctivae normal.  Cardiovascular:     Rate and Rhythm: Normal rate and regular rhythm.     Pulses: Normal pulses.     Heart sounds: Normal heart sounds. No murmur. No friction rub. No gallop.   Pulmonary:     Effort: Pulmonary effort is normal.     Breath sounds: Normal breath sounds. No wheezing, rhonchi or rales.  Abdominal:     General: Bowel sounds are normal.     Palpations: Abdomen is soft.     Tenderness: There is no abdominal tenderness. There is no guarding.  Musculoskeletal:        General: No swelling.     Right lower leg: No edema.     Left lower leg: No edema.  Neurological:     General: No focal deficit present.     Mental Status: She is alert and oriented to person, place, and time.  Psychiatric:        Mood and Affect: Mood normal.        Behavior: Behavior normal.     Assessment/Plan:  Active Problems:   Acute respiratory  failure with hypoxemia [facility])  [name] is a 68 y/o female, with a PMH CKD5, HTN, DM, who presented to the hospital on 03/31/2019 with [facility] and chest pain.    1. Hypoxic Respirator Distress 2/2 to Pulmonary Edema/Volume Overload: - Patient currently weaned down to room air. - Patient breathing comfortably on room air, O2 saturation.  2. Community Acquired Pneumonia:  - Leukocytosis: 8.6 - Completed Ceftriaxone and Azithromycin course - Expectorated sputum assessment w rflx to resp culture: Pending  3. ESRD with Pulmonary Edema:  - Patient scheduled for dialysis today. Patient took an Ensure this morning, and her tunneled catheter procedure has been rescheduled for tomorrow. She will undergo dialysis with her RIJ cath. - NPO at [place]. - We discussed her return to [place], and if there were any updates. Patient states that she is unsure if her son is coming today or tomorrow. She is not aware of any updates.   - Continue to F/U with Nephrology recommendations, we appreciate their assistance.  4. Hyponatremia:  Hyponatremia likely 2/2 to volume overload, will continue to monitor if Na improves with  HD sessions.  - Na: 130  5. Diabetes Mellitis:  - Continue to monitor glucose - Continue SSI  6. Acute Cough:  Cough is well controlled and maintained on her current regimen.  - Continue benzonatate to 200 mg TID.   7. Hypotension:  Overnight, Patient vitals had a downward trend in the systolic range from the 123456 down to 94/54. This trend could be due to her recent HD treatment. Will DC amlodipine at this time. - Patient's pressures have improved, will continue to monitor.    Dispo: Anticipated discharge pending medical course.  Maudie Mercury, MD IMTS, PGY-1 Pager: [phone] 04/09/2019,6:56 AM

## 2019-04-10 DIAGNOSIS — N186 End stage renal disease: Secondary | ICD-10-CM

## 2019-04-10 DIAGNOSIS — Z992 Dependence on renal dialysis: Secondary | ICD-10-CM

## 2019-04-10 LAB — CBC
HCT: 30.7 % — ABNORMAL LOW (ref 36.0–46.0)
Hemoglobin: 9.9 g/dL — ABNORMAL LOW (ref 12.0–15.0)
MCH: 27.9 pg (ref 26.0–34.0)
MCHC: 32.2 g/dL (ref 30.0–36.0)
MCV: 86.5 fL (ref 80.0–100.0)
Platelets: 192 10*3/uL (ref 150–400)
RBC: 3.55 MIL/uL — ABNORMAL LOW (ref 3.87–5.11)
RDW: 14 % (ref 11.5–15.5)
WBC: 9.2 10*3/uL (ref 4.0–10.5)
nRBC: 0 % (ref 0.0–0.2)

## 2019-04-10 LAB — RENAL FUNCTION PANEL
Albumin: 2.7 g/dL — ABNORMAL LOW (ref 3.5–5.0)
Anion gap: 11 (ref 5–15)
BUN: 25 mg/dL — ABNORMAL HIGH (ref 8–23)
CO2: 26 mmol/L (ref 22–32)
Calcium: 7.9 mg/dL — ABNORMAL LOW (ref 8.9–10.3)
Chloride: 96 mmol/L — ABNORMAL LOW (ref 98–111)
Creatinine, Ser: 3.3 mg/dL — ABNORMAL HIGH (ref 0.44–1.00)
GFR calc Af Amer: 16 mL/min — ABNORMAL LOW (ref >60)
GFR calc non Af Amer: 13 mL/min — ABNORMAL LOW (ref >60)
Glucose, Bld: 131 mg/dL — ABNORMAL HIGH (ref 70–99)
Phosphorus: 2.6 mg/dL (ref 2.5–4.6)
Potassium: 4.4 mmol/L (ref 3.5–5.1)
Sodium: 133 mmol/L — ABNORMAL LOW (ref 135–145)

## 2019-04-10 LAB — GLUCOSE, CAPILLARY
Glucose-Capillary: 114 mg/dL — ABNORMAL HIGH (ref 70–99)
Glucose-Capillary: 132 mg/dL — ABNORMAL HIGH (ref 70–99)
Glucose-Capillary: 353 mg/dL — ABNORMAL HIGH (ref 70–99)
Glucose-Capillary: 434 mg/dL — ABNORMAL HIGH (ref 70–99)
Glucose-Capillary: 97 mg/dL (ref 70–99)

## 2019-04-10 MED ORDER — CHLORHEXIDINE GLUCONATE CLOTH 2 % EX PADS: 6.0000 | MEDICATED_PAD | Freq: Every day | CUTANEOUS | Status: DC

## 2019-04-10 MED ORDER — INSULIN ASPART 100 UNIT/ML ~~LOC~~ SOLN
5.0000 [IU] | Freq: Once | SUBCUTANEOUS | Status: AC
  Administered 2019-04-10: 5 [IU] via SUBCUTANEOUS

## 2019-04-10 NOTE — Progress Notes (Signed)
[facility] NEPHROLOGY PROGRESS NOTE  Assessment/ Plan: Pt is a 68 y.o. yo female with DM, HTN, presented with chest pain or shortness of breath, progressive CKD to now ESRD.  #Progressive CKD to ESRD: Started HD for severe azotemia and fluid overload.  Refractory to medical management.  Receiving HD as MWF schedule.  She had HD yesterday with 500 cc UF. Plan to convert temporary to permanent HD catheter today by IR today. Apparently, the plan for the patient is to find nephrologist in [place] and ultimately transferred there to continue her care.  Primary team discussed with the family member yesterday.  Social worker and renal navigator following.   #Acute respiratory failure with hypoxia/pulmonary edema: UF during HD.  Improved  #Anemia of CKD: Iron saturation 28%.  Continue Aranesp during HD.  Monitor hemoglobin  #CKD-MBD: Calcium and phosphorus level low.  Received K-Phos.  Continue calcitriol to daily.  PTH 269, at goal for ESRD.  Encourage oral intake  #Hyponatremia: Hypervolemic.  Monitor in HD.  #Hypertension: Continue amlodipine.  Monitor blood pressure  Subjective: Seen and examined at bedside.  Plan per IR procedure today.  No new event.  Objective Vital signs in last 24 hours: Vitals:   04/09/19 1635 04/09/19 1953 04/09/19 2306 04/10/19 0722  BP: 139/71 140/72 (!) 155/70 135/70  Pulse: 99 98 87 90  Resp: (!) 21 20 18  (!) 25  Temp:  98.2 F (36.8 C) 98 F (36.7 C) 98.6 F (37 C)  TempSrc:  Oral Oral Oral  SpO2: 92% 94% 95% 98%  Weight:      Height:       Weight change:   Intake/Output Summary (Last 24 hours) at 04/10/2019 0806 Last data filed at 04/09/2019 1837 Gross per 24 hour  Intake 388 ml  Output 2 ml  Net 386 ml       Labs: Basic Metabolic Panel: Recent Labs  Lab 04/07/19 0508 04/08/19 0438 04/09/19 0802  NA 130* 133* 130*  K 4.4 3.7 4.3  CL 96* 95* 92*  CO2 21* 28 24  GLUCOSE 108* 147* 270*  BUN 48* 24* 49*  CREATININE  5.52* 2.77* 4.97*  CALCIUM 7.4* 7.2* 7.6*  PHOS 2.4* 2.1* 2.1*   Liver Function Tests: Recent Labs  Lab 04/07/19 0508 04/08/19 0438 04/09/19 0802  ALBUMIN 1.7* 1.9* 1.9*   No results for input(s): LIPASE, AMYLASE in the last 168 hours. No results for input(s): AMMONIA in the last 168 hours. CBC: Recent Labs  Lab 04/05/19 1032 04/06/19 0322 04/07/19 0508 04/08/19 0438 04/09/19 0802  WBC 16.1* 12.4* 7.4 6.4 8.6  NEUTROABS  --   --  5.1  --   --   HGB 10.2* 9.7* 9.4* 9.5* 9.4*  HCT 31.0* 29.6* 29.1* 29.6* 28.4*  MCV 85.4 85.1 86.4 84.8 84.0  PLT 192 164 130* 134* 168   Cardiac Enzymes: No results for input(s): CKTOTAL, CKMB, CKMBINDEX, TROPONINI in the last 168 hours. CBG: Recent Labs  Lab 04/09/19 0745 04/09/19 1110 04/09/19 1526 04/09/19 2105 04/10/19 0721  GLUCAP 265* 117* 121* 325* 132*    Iron Studies: No results for input(s): IRON, TIBC, TRANSFERRIN, FERRITIN in the last 72 hours. Studies/Results: No results found.  Medications: Infusions:   Scheduled Medications: . benzonatate  200 mg Oral TID  . calcitRIOL  0.25 mcg Oral Daily  . calcium carbonate  500 mg of elemental calcium Oral Q supper  . Chlorhexidine Gluconate Cloth  6 each Topical Q0600  . darbepoetin (ARANESP) injection -  DIALYSIS  40 mcg Intravenous Q Wed-HD  . famotidine  20 mg Oral Daily  . feeding supplement (ENSURE ENLIVE)  237 mL Oral TID BM  . insulin aspart  0-6 Units Subcutaneous TID WC  . mouth rinse  15 mL Mouth Rinse BID  . multivitamin  1 tablet Oral QHS    have reviewed scheduled and prn medications.  Physical Exam: General: Lying flat on bed comfortable, not in distress Heart:RRR, s1s2 nl Lungs: Clear b/l, no crackle Abdomen:soft, Non-tender, non-distended Extremities:No LE edema Dialysis Access: Right IJ temporary catheter placed by IR on 12/9.  Amber Dixon 04/10/2019,8:06 AM  LOS: 10 days  Pager: [phone]

## 2019-04-10 NOTE — Progress Notes (Signed)
The IMTS team was page, by Ms. Barron's RN that family was present and would like to speak with their doctor about patient's dialysis status.  Dr. Truman Hayward and Dr. Gilford Rile saw the patient, her son, and spoke with the daughter in law about their mother's current condition. Per the daughter, she spoke with [facility] finances about her mother in law's situation, and may qualify for financial aid through one of their programs. DIL stated that she is completing the form today, and should have an answer tomorrow. Meanwhile, family is requesting a possible transfer to [facility]. Patient's DIL did not have contact information or the name of the [facility] liaison or the name of the financial program her MIL would qualify for aid. DIL states that she will furnish [place] with that information, when she finds the documents.   When speaking with Trenda Moots, RN, has reached out to Kerry Kass, [place], in regards to the financial aid program. She also spoke with Case Management, and CM was not aware of this program. Please see RN note for full details.   Ultimately, we spoke with the patient's family informing them that we would need confirmation of this program and to complete the form to see if their mother qualifies for financial aid. Additionally, family asked about the status of their mother's diet. We explained that patient is currently NPO due to the need for a tunneled catheter, and the adverse effects if the patient were to aspirate. They voiced their understanding and were agreeable to the plan.

## 2019-04-10 NOTE — Progress Notes (Signed)
RN was addressed by pt's son and daughter-in-law. The DIL asked to have pt transferred to [facility]. RN clarified if this was for current inpatient interventions or for future outpatient dialysis treatments. Per conversation with family they have looked into "[place]" per [facility] which they stated would cover a part of pt's dialysis financial costs. RN paged Levada Dy CSW, Thornton Papas (nephrology outpatient navigator - RN left voice mail), and Winters MD.   RN called IR to see what was pt's estimated time for tunneled HD catheter to be placed. They stated 5 patients are ahead of her and it may be as late as tomorrow for it to be placed. IR stated pt is not allowed to leave hospital with temporal HD catheter in pt's body.   RN informed pt's son and DIL of IR updates. They verbalized understanding.    After Gilford Rile MD visited family and pt - plan still in place for pt to stay here, and have tunneled HD cath placed. RN will continue to monitor pt.

## 2019-04-10 NOTE — Progress Notes (Signed)
Patient planned for conversion of temporary HD catheter to [facility] today - unfortunately this procedure will be postponed until tomorrow (12/18) due to several emergency procedures in IR.  May restart diet, NPO at midnight, continue to hold heparin until post procedure, IR will call for patient when ready. Information relayed to patient's RN Hoyle Sauer today by this Probation officer.  Please call IR with questions or concerns.  Candiss Norse, PA-C

## 2019-04-10 NOTE — Progress Notes (Signed)
Subjective:   Patient was seen this morning on rounds Patients states the she is hungry and has not received food this morning I explained that she needs to get the dialysis catheter prior to eating today. Patient seemed confused despite explaining the reason several times. Patient confused why she needs to go back to [place] for dialysis if she is getting it in the hospital. I explained to her that she will not be able to get dialysis outside the hospital because she is not a citizen of the [place]. I explained to her that it is critically important that she return to [place] in order to get dialysis as soon as possible in the out patient setting. When asked if she has any question regarding this information, she states that she is just hungry. It is difficult to determine if the patient truly understands the gravity of her situation despite plainly and clearly explaining it to her. I will call her son again today to try to coordinate her trip back to [place].  Objective:  Vital signs in last 24 hours: Vitals:   04/09/19 1527 04/09/19 1635 04/09/19 1953 04/09/19 2306  BP:  139/71 140/72 (!) 155/70  Pulse: 96 99 98 87  Resp: (!) 25 (!) 21 20 18   Temp:   98.2 F (36.8 C) 98 F (36.7 C)  TempSrc:   Oral Oral  SpO2: 96% 92% 94% 95%  Weight:      Height:       Physical Exam Vitals and nursing note reviewed.  Constitutional:      General: She is not in acute distress.    Appearance: Normal appearance. She is not ill-appearing or toxic-appearing.     Comments: Patient lying quietly in bed.   HENT:     Head: Normocephalic and atraumatic.  Eyes:     General:        Right eye: No discharge.        Left eye: No discharge.     Conjunctiva/sclera: Conjunctivae normal.  Cardiovascular:     Rate and Rhythm: Normal rate and regular rhythm.     Pulses: Normal pulses.     Heart sounds: Normal heart sounds. No murmur. No friction rub. No gallop.   Pulmonary:     Effort: Pulmonary effort is normal.     Breath sounds: Normal breath sounds. No wheezing, rhonchi or rales.  Abdominal:     General: Bowel sounds are normal.     Palpations: Abdomen is soft.     Tenderness: There is no abdominal tenderness. There is no guarding.  Musculoskeletal:        General: No swelling.     Right lower leg: No edema.     Left lower leg: No edema.  Neurological:     General: No focal deficit present.     Mental Status: She is alert and oriented to person, place, and time.  Psychiatric:        Mood and Affect: Mood normal.        Behavior: Behavior normal.    Assessment/Plan:  Active Problems:   Acute respiratory failure with hypoxemia [facility])  [name] is a 68 y/o female, with a PMH CKD5, HTN, DM, who presented with hypoxic distress 2/2 volume overload with newfound ESRD.   ESRD with Pulmonary Edema:  - Tunneled cath procedure scheduled for today. Diet to resume after procedure.  - We discussed her return to [place], and the dire need to return to [place] immediately as  she is not a candidate for outpatient HD in the [place]. Unsure if patient or family understand the gravity of her situation. Will reach out to the son again today to discuss plans.  - Continue to F/U with Nephrology recommendations, we appreciate their assistance.  Hyponatremia:  Hyponatremia likely 2/2 to volume overload, will continue to monitor if Na improves with HD sessions.   Diabetes Mellitis:  - Continue to monitor glucose - Continue SSI  Acute Cough:  - Improving - Continue benzonatate to 200 mg TID.   Hypoxic Respiratory Distress 2/2 to Pulmonary Edema/Volume Overload (Resolving): - Patient on room air. Improved.    Community Acquired Pneumonia:  - Expectorated sputum assessment w rflx to resp culture: Pending - Completed antibiotic course.   Dispo: Anticipated discharge pending medical course.  Maudie Mercury, MD IMTS, PGY-1 Pager: [phone] 04/10/2019,5:58 AM

## 2019-04-11 LAB — RENAL FUNCTION PANEL
Albumin: 2.5 g/dL — ABNORMAL LOW (ref 3.5–5.0)
Anion gap: 14 (ref 5–15)
BUN: 46 mg/dL — ABNORMAL HIGH (ref 8–23)
CO2: 23 mmol/L (ref 22–32)
Calcium: 7.8 mg/dL — ABNORMAL LOW (ref 8.9–10.3)
Chloride: 90 mmol/L — ABNORMAL LOW (ref 98–111)
Creatinine, Ser: 4.64 mg/dL — ABNORMAL HIGH (ref 0.44–1.00)
GFR calc Af Amer: 10 mL/min — ABNORMAL LOW (ref >60)
GFR calc non Af Amer: 9 mL/min — ABNORMAL LOW (ref >60)
Glucose, Bld: 238 mg/dL — ABNORMAL HIGH (ref 70–99)
Phosphorus: 2.5 mg/dL (ref 2.5–4.6)
Potassium: 4.7 mmol/L (ref 3.5–5.1)
Sodium: 127 mmol/L — ABNORMAL LOW (ref 135–145)

## 2019-04-11 LAB — GLUCOSE, CAPILLARY
Glucose-Capillary: 118 mg/dL — ABNORMAL HIGH (ref 70–99)
Glucose-Capillary: 130 mg/dL — ABNORMAL HIGH (ref 70–99)
Glucose-Capillary: 233 mg/dL — ABNORMAL HIGH (ref 70–99)
Glucose-Capillary: 247 mg/dL — ABNORMAL HIGH (ref 70–99)
Glucose-Capillary: 259 mg/dL — ABNORMAL HIGH (ref 70–99)
Glucose-Capillary: 376 mg/dL — ABNORMAL HIGH (ref 70–99)

## 2019-04-11 LAB — CBC
HCT: 27.3 % — ABNORMAL LOW (ref 36.0–46.0)
Hemoglobin: 9.1 g/dL — ABNORMAL LOW (ref 12.0–15.0)
MCH: 28.4 pg (ref 26.0–34.0)
MCHC: 33.3 g/dL (ref 30.0–36.0)
MCV: 85.3 fL (ref 80.0–100.0)
Platelets: 246 10*3/uL (ref 150–400)
RBC: 3.2 MIL/uL — ABNORMAL LOW (ref 3.87–5.11)
RDW: 13.8 % (ref 11.5–15.5)
WBC: 13.9 10*3/uL — ABNORMAL HIGH (ref 4.0–10.5)
nRBC: 0 % (ref 0.0–0.2)

## 2019-04-11 MED ORDER — HEPARIN SODIUM (PORCINE) 1000 UNIT/ML DIALYSIS: 20.0000 [IU]/kg | INTRAMUSCULAR | Status: DC | PRN

## 2019-04-11 MED ORDER — LIDOCAINE-PRILOCAINE 2.5-2.5 % EX CREA: 1.0000 "application " | TOPICAL_CREAM | CUTANEOUS | Status: DC | PRN

## 2019-04-11 MED ORDER — LIDOCAINE HCL (PF) 1 % IJ SOLN: 5.0000 mL | INTRAMUSCULAR | Status: DC | PRN

## 2019-04-11 MED ORDER — SODIUM CHLORIDE 0.9 % IV SOLN: 100.0000 mL | INTRAVENOUS | Status: DC | PRN

## 2019-04-11 MED ORDER — INSULIN ASPART 100 UNIT/ML ~~LOC~~ SOLN
0.0000 [IU] | SUBCUTANEOUS | Status: DC
  Administered 2019-04-11: 5 [IU] via SUBCUTANEOUS
  Administered 2019-04-12 (×2): 3 [IU] via SUBCUTANEOUS
  Administered 2019-04-12 – 2019-04-13 (×2): 2 [IU] via SUBCUTANEOUS
  Administered 2019-04-13 (×2): 1 [IU] via SUBCUTANEOUS
  Administered 2019-04-14: 2 [IU] via SUBCUTANEOUS
  Administered 2019-04-14 (×2): 1 [IU] via SUBCUTANEOUS
  Administered 2019-04-15: 3 [IU] via SUBCUTANEOUS
  Administered 2019-04-15 (×2): 1 [IU] via SUBCUTANEOUS
  Administered 2019-04-15 – 2019-04-16 (×3): 2 [IU] via SUBCUTANEOUS
  Administered 2019-04-17: 1 [IU] via SUBCUTANEOUS
  Administered 2019-04-17: 2 [IU] via SUBCUTANEOUS
  Administered 2019-04-18 – 2019-04-19 (×4): 1 [IU] via SUBCUTANEOUS
  Administered 2019-04-19: 3 [IU] via SUBCUTANEOUS
  Administered 2019-04-19: 2 [IU] via SUBCUTANEOUS
  Administered 2019-04-20: 3 [IU] via SUBCUTANEOUS
  Administered 2019-04-20: 2 [IU] via SUBCUTANEOUS
  Administered 2019-04-21 (×2): 1 [IU] via SUBCUTANEOUS
  Administered 2019-04-21: 3 [IU] via SUBCUTANEOUS
  Administered 2019-04-21: 1 [IU] via SUBCUTANEOUS
  Administered 2019-04-22: 3 [IU] via SUBCUTANEOUS
  Administered 2019-04-22 – 2019-04-24 (×4): 1 [IU] via SUBCUTANEOUS
  Administered 2019-04-24 (×2): 2 [IU] via SUBCUTANEOUS
  Administered 2019-04-25: 1 [IU] via SUBCUTANEOUS

## 2019-04-11 MED ORDER — PENTAFLUOROPROP-TETRAFLUOROETH EX AERO: 1.0000 "application " | INHALATION_SPRAY | CUTANEOUS | Status: DC | PRN

## 2019-04-11 MED ORDER — ALTEPLASE 2 MG IJ SOLR: 2.0000 mg | Freq: Once | INTRAMUSCULAR | Status: DC | PRN

## 2019-04-11 MED ORDER — HEPARIN SODIUM (PORCINE) 1000 UNIT/ML DIALYSIS: 1000.0000 [IU] | INTRAMUSCULAR | Status: DC | PRN

## 2019-04-11 MED ORDER — HEPARIN SODIUM (PORCINE) 1000 UNIT/ML IJ SOLN
INTRAMUSCULAR | Status: AC
  Administered 2019-04-11: 600 [IU] via INTRAVENOUS_CENTRAL
  Filled 2019-04-11: qty 1

## 2019-04-11 MED ORDER — HEPARIN SODIUM (PORCINE) 1000 UNIT/ML IJ SOLN
INTRAMUSCULAR | Status: AC
  Administered 2019-04-11: 2600 [IU] via INTRAVENOUS_CENTRAL
  Filled 2019-04-11: qty 4

## 2019-04-11 NOTE — Progress Notes (Addendum)
2157 Paged Internal Medicine re: blood glucose level 376, pt just finished eating items off of dinner tray, and will be NPO @ 12 midnight, awaiting further instruction.  2224 Provider returned call with new orders to administer 5 units of Novolog insulin and check blood glucose q 4 hr-same done. Will cont to monitor.  0320 Blood sugar 259-3 units Novolog insulin given at this time.  [id number] Blood sugar 110- no coverage given at this time.

## 2019-04-11 NOTE — Progress Notes (Signed)
Floor called stating pt is out of hemodialysis now. Unable to accommodate due to emergent case coming from [facility].

## 2019-04-11 NOTE — Progress Notes (Signed)
IR requested by Dr. Carolin Sicks for possible image-guided nontunneled to tunneled HD catheter conversion.  Patient was in dialysis this AM. When finished with dialysis, unable to proceed with case due to emergent GVC procedure. Plan for image-guided nontunneled to tunneled HD catheter conversion in IR tentative for 04/12/2019 pending scheduling. Patient to be NPO at midnight.  Please call IR with questions/concerns.   Bea Graff Woodley Petzold, PA-C 04/11/2019, 4:42 PM

## 2019-04-11 NOTE — Progress Notes (Signed)
Pt has been in dialysis this afternoon and IR is not able to do procedure while in dialysis.

## 2019-04-11 NOTE — Progress Notes (Signed)
[facility] NEPHROLOGY PROGRESS NOTE  Assessment/ Plan: Pt is a 68 y.o. yo female with DM, HTN, presented with chest pain or shortness of breath, progressive CKD to now ESRD.  #Progressive CKD to ESRD: Started HD for severe azotemia and fluid overload.  Refractory to medical management.  Receiving HD as MWF schedule.  HD today. The IR procedure was postponed to today for [facility] placement Apparently, the plan for the patient is to find nephrologist in [place] and ultimately transferred there to continue her care.  Primary team discussed with the family member yesterday.  Social worker and renal navigator following.   #Acute respiratory failure with hypoxia/pulmonary edema: UF during HD.  Improved  #Anemia of CKD: Iron saturation 28%.  Continue Aranesp during HD.  Monitor hemoglobin  #CKD-MBD: Calcium and phosphorus level low. Continue calcitriol to daily.  PTH 269, at goal for ESRD.  Encourage oral intake  #Hyponatremia: Hypervolemic.  Monitor in HD.  #Hypertension: Continue amlodipine.  Monitor blood pressure  Subjective: Seen and examined at bedside.  Plan per IR procedure today.  No new event. Objective Vital signs in last 24 hours: Vitals:   04/10/19 1620 04/10/19 1919 04/10/19 2321 04/11/19 0327  BP: (!) 145/75 135/70 (!) 142/71 138/74  Pulse: 94 93 90 94  Resp: 20 (!) 21 20 (!) 25  Temp: 98.5 F (36.9 C) 98.3 F (36.8 C) 98.2 F (36.8 C) 99.2 F (37.3 C)  TempSrc: Oral Oral Oral Oral  SpO2: 99% 99% 99% 98%  Weight:      Height:       Weight change:  No intake or output data in the 24 hours ending 04/11/19 0907     Labs: Basic Metabolic Panel: Recent Labs  Lab 04/09/19 0802 04/10/19 1020 04/11/19 0724  NA 130* 133* 127*  K 4.3 4.4 4.7  CL 92* 96* 90*  CO2 24 26 23   GLUCOSE 270* 131* 238*  BUN 49* 25* 46*  CREATININE 4.97* 3.30* 4.64*  CALCIUM 7.6* 7.9* 7.8*  PHOS 2.1* 2.6 2.5   Liver Function Tests: Recent Labs  Lab 04/09/19 0802  04/10/19 1020 04/11/19 0724  ALBUMIN 1.9* 2.7* 2.5*   No results for input(s): LIPASE, AMYLASE in the last 168 hours. No results for input(s): AMMONIA in the last 168 hours. CBC: Recent Labs  Lab 04/06/19 0322 04/07/19 0508 04/08/19 0438 04/09/19 0802 04/10/19 1019  WBC 12.4* 7.4 6.4 8.6 9.2  NEUTROABS  --  5.1  --   --   --   HGB 9.7* 9.4* 9.5* 9.4* 9.9*  HCT 29.6* 29.1* 29.6* 28.4* 30.7*  MCV 85.1 86.4 84.8 84.0 86.5  PLT 164 130* 134* 168 192   Cardiac Enzymes: No results for input(s): CKTOTAL, CKMB, CKMBINDEX, TROPONINI in the last 168 hours. CBG: Recent Labs  Lab 04/10/19 1622 04/10/19 2115 04/10/19 2342 04/11/19 0730 04/11/19 0855  GLUCAP 97 434* 353* 247* 233*    Iron Studies: No results for input(s): IRON, TIBC, TRANSFERRIN, FERRITIN in the last 72 hours. Studies/Results: No results found.  Medications: Infusions:   Scheduled Medications: . benzonatate  200 mg Oral TID  . calcitRIOL  0.25 mcg Oral Daily  . calcium carbonate  500 mg of elemental calcium Oral Q supper  . Chlorhexidine Gluconate Cloth  6 each Topical Q0600  . darbepoetin (ARANESP) injection - DIALYSIS  40 mcg Intravenous Q Wed-HD  . famotidine  20 mg Oral Daily  . feeding supplement (ENSURE ENLIVE)  237 mL Oral TID BM  . insulin  aspart  0-6 Units Subcutaneous TID WC  . mouth rinse  15 mL Mouth Rinse BID  . multivitamin  1 tablet Oral QHS    have reviewed scheduled and prn medications.  Physical Exam: General: Not in distress, able to lie flat Heart:RRR, s1s2 nl Lungs: Clear b/l, no crackle Abdomen:soft, Non-tender, non-distended Extremities:No LE edema Dialysis Access: Right IJ temporary catheter placed by IR on 12/9.  Amber Dixon 04/11/2019,9:07 AM  LOS: 11 days  Pager: [phone]

## 2019-04-11 NOTE — Progress Notes (Addendum)
   Subjective:  Tunnel catheter scheduled for today. Patient states that she is feeling well. I talked to the patient about her tunnel cath procedure and informed her that she will hopefully get it today. I explained that she didn't get it yesterday because IR had other emergency procedures before her. We are trying to figure out if the patient would qualify for a financial aid program in [place] for dialysis. It has been difficult to coordinate the patients follow up care with family as they would like her to stay in the [place] for treatment.   Objective: Vital signs in last 24 hours: Vitals:   04/10/19 1620 04/10/19 1919 04/10/19 2321 04/11/19 0327  BP: (!) 145/75 135/70 (!) 142/71 138/74  Pulse: 94 93 90 94  Resp: 20 (!) 21 20 (!) 25  Temp: 98.5 F (36.9 C) 98.3 F (36.8 C) 98.2 F (36.8 C) 99.2 F (37.3 C)  TempSrc: Oral Oral Oral Oral  SpO2: 99% 99% 99% 98%  Weight:      Height:      Physical Exam Vitals reviewed.  Constitutional:      General: She is not in acute distress.    Appearance: She is not ill-appearing or toxic-appearing.  HENT:     Head: Normocephalic and atraumatic.  Cardiovascular:     Rate and Rhythm: Normal rate and regular rhythm.     Heart sounds: No murmur. No friction rub. No gallop.   Pulmonary:     Effort: Pulmonary effort is normal.     Breath sounds: Normal breath sounds. No wheezing, rhonchi or rales.  Abdominal:     General: Bowel sounds are normal. There is no distension.     Palpations: Abdomen is soft.  Neurological:     Mental Status: She is alert and oriented to person, place, and time.     Assessment/Plan:  Principal Problem:   ESRD (end stage renal disease) on dialysis [facility]) Active Problems:   Acute respiratory failure with hypoxemia [facility])  [name] is a 68 y/o female, with a PMH CKD5, HTN, DM, who presented with hypoxic distress 2/2 volume overload with newfound ESRD.   1. Hypoxic Respirator Distress 2/2 to Pulmonary  Edema/Volume Overload: - Patient saturating at 100% O2 on room air.  - Continue to monitor.   2. Community Acquired Pneumonia:  - Expectorated sputum assessment w rflx to resp culture: Pending - Completed antibiotic course.   3. ESRD with Pulmonary Edema:  - Tunnel catheter scheduled for today due to several emergency procedures, per IR note on 12/17. - Continue to F/U with Nephrology recommendations, we appreciate their assistance.  4. Hyponatremia:  Patient has been NPO for roughly 48 hours, likely 2/2 to tea and toast diet.  - Will continue to monitor - Na: 127  5. Diabetes Mellitis:  - Continue to monitor glucose - Continue SSI  6. Acute Cough:  Cough is well controlled and maintained on her current regimen.  - Continue benzonatate to 200 mg TID.    Dispo: Anticipated discharge pending medical course.  Maudie Mercury, MD IMTS, PGY-1 Pager: [phone] 04/11/2019,6:34 AM

## 2019-04-12 ENCOUNTER — Other Ambulatory Visit: Payer: Self-pay

## 2019-04-12 DIAGNOSIS — D72829 Elevated white blood cell count, unspecified: Secondary | ICD-10-CM

## 2019-04-12 LAB — RENAL FUNCTION PANEL
Albumin: 2.3 g/dL — ABNORMAL LOW (ref 3.5–5.0)
Anion gap: 11 (ref 5–15)
BUN: 21 mg/dL (ref 8–23)
CO2: 28 mmol/L (ref 22–32)
Calcium: 7.4 mg/dL — ABNORMAL LOW (ref 8.9–10.3)
Chloride: 88 mmol/L — ABNORMAL LOW (ref 98–111)
Creatinine, Ser: 2.54 mg/dL — ABNORMAL HIGH (ref 0.44–1.00)
GFR calc Af Amer: 21 mL/min — ABNORMAL LOW (ref >60)
GFR calc non Af Amer: 18 mL/min — ABNORMAL LOW (ref >60)
Glucose, Bld: 248 mg/dL — ABNORMAL HIGH (ref 70–99)
Phosphorus: 1.6 mg/dL — ABNORMAL LOW (ref 2.5–4.6)
Potassium: 4.6 mmol/L (ref 3.5–5.1)
Sodium: 127 mmol/L — ABNORMAL LOW (ref 135–145)

## 2019-04-12 LAB — GLUCOSE, CAPILLARY
Glucose-Capillary: 110 mg/dL — ABNORMAL HIGH (ref 70–99)
Glucose-Capillary: 148 mg/dL — ABNORMAL HIGH (ref 70–99)
Glucose-Capillary: 212 mg/dL — ABNORMAL HIGH (ref 70–99)
Glucose-Capillary: 241 mg/dL — ABNORMAL HIGH (ref 70–99)
Glucose-Capillary: 284 mg/dL — ABNORMAL HIGH (ref 70–99)
Glucose-Capillary: 94 mg/dL (ref 70–99)

## 2019-04-12 LAB — CBC
HCT: 28.8 % — ABNORMAL LOW (ref 36.0–46.0)
Hemoglobin: 9.4 g/dL — ABNORMAL LOW (ref 12.0–15.0)
MCH: 28 pg (ref 26.0–34.0)
MCHC: 32.6 g/dL (ref 30.0–36.0)
MCV: 85.7 fL (ref 80.0–100.0)
Platelets: 263 10*3/uL (ref 150–400)
RBC: 3.36 MIL/uL — ABNORMAL LOW (ref 3.87–5.11)
RDW: 14.1 % (ref 11.5–15.5)
WBC: 13.6 10*3/uL — ABNORMAL HIGH (ref 4.0–10.5)
nRBC: 0 % (ref 0.0–0.2)

## 2019-04-12 MED ORDER — CALCITRIOL 0.25 MCG PO CAPS
0.5000 ug | ORAL_CAPSULE | Freq: Every day | ORAL | Status: DC
  Administered 2019-04-12 – 2019-04-18 (×5): 0.5 ug via ORAL
  Filled 2019-04-12 (×5): qty 2

## 2019-04-12 MED ORDER — INFLUENZA VAC A&B SA ADJ QUAD 0.5 ML IM PRSY
0.5000 mL | PREFILLED_SYRINGE | INTRAMUSCULAR | Status: AC
  Administered 2019-04-13: 0.5 mL via INTRAMUSCULAR
  Filled 2019-04-12: qty 0.5

## 2019-04-12 MED ORDER — K PHOS MONO-SOD PHOS DI & MONO 155-852-130 MG PO TABS
500.0000 mg | ORAL_TABLET | Freq: Every day | ORAL | Status: DC
  Administered 2019-04-12 – 2019-04-15 (×3): 500 mg via ORAL
  Filled 2019-04-12 (×5): qty 2

## 2019-04-12 MED ORDER — PNEUMOCOCCAL VAC POLYVALENT 25 MCG/0.5ML IJ INJ
0.5000 mL | INJECTION | INTRAMUSCULAR | Status: AC
  Administered 2019-04-13: 0.5 mL via INTRAMUSCULAR
  Filled 2019-04-12 (×2): qty 0.5

## 2019-04-12 NOTE — Progress Notes (Signed)
[facility] NEPHROLOGY PROGRESS NOTE  Assessment/ Plan: Pt is a 68 y.o. yo female with DM, HTN, presented with chest pain or shortness of breath, progressive CKD to now ESRD.  #Progressive CKD to ESRD: Started HD for severe azotemia and fluid overload.  Refractory to medical management.  Receiving HD as MWF schedule.  Status post HD on 12/18 with 500 cc UF.  Next HD on Monday. The IR procedure was postponed to today for [facility] placement Apparently, the plan for the patient is to find nephrologist in [place] and ultimately transferred there to continue her care.  Primary team discussed with the family member yesterday.  Social worker and renal navigator following.   #Acute respiratory failure with hypoxia/pulmonary edema: UF during HD.  Improved  #Anemia of CKD: Iron saturation 28%.  Continue Aranesp during HD.  Monitor hemoglobin  #CKD-MBD: Calcium and phosphorus level low.  Increase calcitriol dose and replete Neutra-Phos.  Encourage oral intake.  PTH 269, at goal for ESRD.  Encourage oral intake  #Hyponatremia: Hypervolemic.  Monitor in HD.  #Hypertension: Continue amlodipine.  Monitor blood pressure  Subjective: Seen and examined at bedside.  Hopefully patient will get [facility] exchange by IR today.  No new event.  Last HD yesterday.  Objective Vital signs in last 24 hours: Vitals:   04/11/19 2002 04/11/19 2307 04/12/19 0300 04/12/19 0721  BP: (!) 97/48 (!) 112/59 (!) 100/52 128/67  Pulse: 94 90  85  Resp: 14 14 14 20   Temp: 98 F (36.7 C) 98 F (36.7 C) 97.8 F (36.6 C) 98.8 F (37.1 C)  TempSrc: Oral Oral Oral Oral  SpO2: 99% 99% 98% 99%  Weight:      Height:       Weight change:   Intake/Output Summary (Last 24 hours) at 04/12/2019 0836 Last data filed at 04/11/2019 1533 Gross per 24 hour  Intake --  Output 500 ml  Net -500 ml       Labs: Basic Metabolic Panel: Recent Labs  Lab 04/10/19 1020 04/11/19 0724 04/12/19 0225  NA 133* 127* 127*  K 4.4  4.7 4.6  CL 96* 90* 88*  CO2 26 23 28   GLUCOSE 131* 238* 248*  BUN 25* 46* 21  CREATININE 3.30* 4.64* 2.54*  CALCIUM 7.9* 7.8* 7.4*  PHOS 2.6 2.5 1.6*   Liver Function Tests: Recent Labs  Lab 04/10/19 1020 04/11/19 0724 04/12/19 0225  ALBUMIN 2.7* 2.5* 2.3*   No results for input(s): LIPASE, AMYLASE in the last 168 hours. No results for input(s): AMMONIA in the last 168 hours. CBC: Recent Labs  Lab 04/07/19 0508 04/08/19 0438 04/09/19 0802 04/10/19 1019 04/11/19 1240 04/12/19 0657  WBC 7.4 6.4 8.6 9.2 13.9* 13.6*  NEUTROABS 5.1  --   --   --   --   --   HGB 9.4* 9.5* 9.4* 9.9* 9.1* 9.4*  HCT 29.1* 29.6* 28.4* 30.7* 27.3* 28.8*  MCV 86.4 84.8 84.0 86.5 85.3 85.7  PLT 130* 134* 168 192 246 263   Cardiac Enzymes: No results for input(s): CKTOTAL, CKMB, CKMBINDEX, TROPONINI in the last 168 hours. CBG: Recent Labs  Lab 04/11/19 1617 04/11/19 2145 04/11/19 2336 04/12/19 0442 04/12/19 0721  GLUCAP 130* 376* 259* 110* 94    Iron Studies: No results for input(s): IRON, TIBC, TRANSFERRIN, FERRITIN in the last 72 hours. Studies/Results: No results found.  Medications: Infusions:   Scheduled Medications: . benzonatate  200 mg Oral TID  . calcitRIOL  0.25 mcg Oral Daily  .  calcium carbonate  500 mg of elemental calcium Oral Q supper  . Chlorhexidine Gluconate Cloth  6 each Topical Q0600  . darbepoetin (ARANESP) injection - DIALYSIS  40 mcg Intravenous Q Wed-HD  . famotidine  20 mg Oral Daily  . feeding supplement (ENSURE ENLIVE)  237 mL Oral TID BM  . insulin aspart  0-6 Units Subcutaneous Q4H  . mouth rinse  15 mL Mouth Rinse BID  . multivitamin  1 tablet Oral QHS    have reviewed scheduled and prn medications.  Physical Exam: General: Not in distress, able to lie flat Heart:RRR, s1s2 nl Lungs: Clear b/l, no crackle Abdomen:soft, Non-tender, non-distended Extremities:No LE edema Dialysis Access: Right IJ temporary catheter placed by IR on 12/9.  Amber Dixon  Amber Dixon 04/12/2019,8:36 AM  LOS: 12 days  Pager: ID:[phone]

## 2019-04-12 NOTE — Progress Notes (Signed)
Subjective:  Amber Dixon was examined at bedside and we updated her regarding the delay in the placement of her tunnel catheter. She reports that she is feeling well however she feels stressed because of hunger and lack of sleep. We spoke about aspiration risks, if she were to break her NPO orders. She voiced understanding She denies abdominal pain or nausea. All her questions and complaints were appropriately addressed.   Objective: Vital signs in last 24 hours: Vitals:   04/11/19 1611 04/11/19 2002 04/11/19 2307 04/12/19 0300  BP: 134/65 (!) 97/48 (!) 112/59 (!) 100/52  Pulse: 95 94 90   Resp: 18 14 14 14   Temp: 98.2 F (36.8 C) 98 F (36.7 C) 98 F (36.7 C) 97.8 F (36.6 C)  TempSrc: Oral Oral Oral Oral  SpO2: 100% 99% 99% 98%  Weight:      Height:       Physical Exam Constitutional:      General: She is not in acute distress.    Appearance: She is not ill-appearing or toxic-appearing.  HENT:     Head: Normocephalic and atraumatic.  Cardiovascular:     Rate and Rhythm: Normal rate and regular rhythm.     Heart sounds: Normal heart sounds. No murmur. No friction rub. No gallop.   Pulmonary:     Effort: Pulmonary effort is normal.     Breath sounds: Normal breath sounds. No wheezing, rhonchi or rales.  Abdominal:     General: Bowel sounds are normal. There is no distension.     Palpations: Abdomen is soft.     Tenderness: There is no guarding.  Skin:    General: Skin is warm.     Findings: No rash.  Neurological:     Mental Status: She is alert and oriented to person, place, and time.  Psychiatric:        Mood and Affect: Mood normal.        Behavior: Behavior normal.    CBG (last 3)  Recent Labs    04/11/19 2145 04/11/19 2336 04/12/19 0442  GLUCAP 376* 259* 110*     Assessment/Plan:  Principal Problem:   ESRD (end stage renal disease) on dialysis [facility]) Active Problems:   Acute respiratory failure with hypoxemia [facility])  Amber Dixon is a 68 y/o  female, with a PMH CKD5, HTN, DM, who presented with hypoxic distress 2/2 volume overload with newfound ESRD.   1. Hypoxic Respirator Distress 2/2 to Pulmonary Edema/Volume Overload: - Patient saturating at 99% O2 on room air.  - Continuing to monitor.    2. Leukocytosis:  - WBC of 13.6 thought to be likely 2/2 to post dialysis. Patient does RIJ in place, which is concerning for infection. Upon physical examination today there were no signs of erythema or discharge from the site. Patient is asymptomatic with stable vitals. If patient becomes symptomatic, will obtain blood cultures.   3. ESRD with Pulmonary Edema:  - Tunnel catheter scheduled for today, pending scheduling. - Continue to F/U with Nephrology recommendations, we appreciate their assistance.  4. Hyponatremia:  Patient has been NPO for roughly 48 hours, likely 2/2 to tea and toast diet.  - Will continue to monitor - Na: 127  5. Diabetes Mellitis:  - Continue to monitor glucose - Continue SSI  6. Acute Cough:  Cough is well controlled and maintained on her current regimen.  - Continue benzonatate to 200 mg TID.  Community Acquired Pneumonia:  - Completed antibiotic course.  Dispo: Anticipated discharge pending medical course.  Maudie Mercury, MD IMTS, PGY-1 Pager: [phone] 04/12/2019,5:47 AM

## 2019-04-13 ENCOUNTER — Inpatient Hospital Stay (HOSPITAL_COMMUNITY): Payer: Self-pay

## 2019-04-13 HISTORY — PX: IR FLUORO GUIDE CV LINE RIGHT: IMG2283

## 2019-04-13 LAB — CBC
HCT: 28.3 % — ABNORMAL LOW (ref 36.0–46.0)
Hemoglobin: 9.2 g/dL — ABNORMAL LOW (ref 12.0–15.0)
MCH: 28 pg (ref 26.0–34.0)
MCHC: 32.5 g/dL (ref 30.0–36.0)
MCV: 86 fL (ref 80.0–100.0)
Platelets: 312 10*3/uL (ref 150–400)
RBC: 3.29 MIL/uL — ABNORMAL LOW (ref 3.87–5.11)
RDW: 14 % (ref 11.5–15.5)
WBC: 13.4 10*3/uL — ABNORMAL HIGH (ref 4.0–10.5)
nRBC: 0 % (ref 0.0–0.2)

## 2019-04-13 LAB — BASIC METABOLIC PANEL
Anion gap: 13 (ref 5–15)
Anion gap: 12 (ref 5–15)
Anion gap: 12 (ref 5–15)
BUN: 45 mg/dL — ABNORMAL HIGH (ref 8–23)
BUN: 47 mg/dL — ABNORMAL HIGH (ref 8–23)
BUN: 55 mg/dL — ABNORMAL HIGH (ref 8–23)
CO2: 25 mmol/L (ref 22–32)
CO2: 26 mmol/L (ref 22–32)
CO2: 25 mmol/L (ref 22–32)
Calcium: 8 mg/dL — ABNORMAL LOW (ref 8.9–10.3)
Calcium: 7.7 mg/dL — ABNORMAL LOW (ref 8.9–10.3)
Calcium: 7.6 mg/dL — ABNORMAL LOW (ref 8.9–10.3)
Chloride: 89 mmol/L — ABNORMAL LOW (ref 98–111)
Chloride: 89 mmol/L — ABNORMAL LOW (ref 98–111)
Chloride: 89 mmol/L — ABNORMAL LOW (ref 98–111)
Creatinine, Ser: 4.41 mg/dL — ABNORMAL HIGH (ref 0.44–1.00)
Creatinine, Ser: 4.79 mg/dL — ABNORMAL HIGH (ref 0.44–1.00)
Creatinine, Ser: 5.46 mg/dL — ABNORMAL HIGH (ref 0.44–1.00)
GFR calc Af Amer: 11 mL/min — ABNORMAL LOW (ref >60)
GFR calc Af Amer: 10 mL/min — ABNORMAL LOW (ref >60)
GFR calc Af Amer: 8 mL/min — ABNORMAL LOW (ref >60)
GFR calc non Af Amer: 9 mL/min — ABNORMAL LOW (ref >60)
GFR calc non Af Amer: 9 mL/min — ABNORMAL LOW (ref >60)
GFR calc non Af Amer: 7 mL/min — ABNORMAL LOW (ref >60)
Glucose, Bld: 120 mg/dL — ABNORMAL HIGH (ref 70–99)
Glucose, Bld: 117 mg/dL — ABNORMAL HIGH (ref 70–99)
Glucose, Bld: 158 mg/dL — ABNORMAL HIGH (ref 70–99)
Potassium: 5.4 mmol/L — ABNORMAL HIGH (ref 3.5–5.1)
Potassium: 5.2 mmol/L — ABNORMAL HIGH (ref 3.5–5.1)
Potassium: 5.4 mmol/L — ABNORMAL HIGH (ref 3.5–5.1)
Sodium: 127 mmol/L — ABNORMAL LOW (ref 135–145)
Sodium: 127 mmol/L — ABNORMAL LOW (ref 135–145)
Sodium: 126 mmol/L — ABNORMAL LOW (ref 135–145)

## 2019-04-13 LAB — GLUCOSE, CAPILLARY
Glucose-Capillary: 95 mg/dL (ref 70–99)
Glucose-Capillary: 129 mg/dL — ABNORMAL HIGH (ref 70–99)
Glucose-Capillary: 174 mg/dL — ABNORMAL HIGH (ref 70–99)
Glucose-Capillary: 148 mg/dL — ABNORMAL HIGH (ref 70–99)
Glucose-Capillary: 188 mg/dL — ABNORMAL HIGH (ref 70–99)

## 2019-04-13 MED ORDER — MIDAZOLAM HCL 2 MG/2ML IJ SOLN
INTRAMUSCULAR | Status: AC | PRN
  Administered 2019-04-13: 1 mg via INTRAVENOUS

## 2019-04-13 MED ORDER — FENTANYL CITRATE (PF) 100 MCG/2ML IJ SOLN
INTRAMUSCULAR | Status: AC | PRN
  Administered 2019-04-13: 50 ug via INTRAVENOUS

## 2019-04-13 MED ORDER — CHLORHEXIDINE GLUCONATE 4 % EX LIQD
CUTANEOUS | Status: AC | PRN
  Administered 2019-04-13: 1 via TOPICAL

## 2019-04-13 MED ORDER — FENTANYL CITRATE (PF) 100 MCG/2ML IJ SOLN
INTRAMUSCULAR | Status: AC
  Filled 2019-04-13: qty 2

## 2019-04-13 MED ORDER — MIDAZOLAM HCL 2 MG/2ML IJ SOLN
INTRAMUSCULAR | Status: AC
  Filled 2019-04-13: qty 2

## 2019-04-13 MED ORDER — LIDOCAINE HCL 1 % IJ SOLN
INTRAMUSCULAR | Status: AC | PRN
  Administered 2019-04-13: 5 mL

## 2019-04-13 MED ORDER — SODIUM ZIRCONIUM CYCLOSILICATE 5 G PO PACK
5.0000 g | PACK | Freq: Every day | ORAL | Status: AC
  Administered 2019-04-13: 5 g via ORAL
  Filled 2019-04-13: qty 1

## 2019-04-13 MED ORDER — LIDOCAINE HCL 1 % IJ SOLN
INTRAMUSCULAR | Status: AC
  Filled 2019-04-13: qty 20

## 2019-04-13 MED ORDER — HEPARIN SODIUM (PORCINE) 1000 UNIT/ML IJ SOLN
INTRAMUSCULAR | Status: AC | PRN
  Administered 2019-04-13: 3200 [IU] via INTRAVENOUS

## 2019-04-13 MED ORDER — CHLORHEXIDINE GLUCONATE 4 % EX LIQD
CUTANEOUS | Status: AC
  Filled 2019-04-13: qty 15

## 2019-04-13 MED ORDER — HEPARIN SODIUM (PORCINE) 1000 UNIT/ML IJ SOLN
INTRAMUSCULAR | Status: AC
  Filled 2019-04-13: qty 1

## 2019-04-13 MED ORDER — CHLORHEXIDINE GLUCONATE CLOTH 2 % EX PADS
6.0000 | MEDICATED_PAD | Freq: Every day | CUTANEOUS | Status: DC
  Administered 2019-04-14: 6 via TOPICAL

## 2019-04-13 MED ORDER — GELATIN ABSORBABLE 12-7 MM EX MISC
CUTANEOUS | Status: AC
  Filled 2019-04-13: qty 1

## 2019-04-13 NOTE — Procedures (Signed)
Interventional Radiology Procedure Note  Procedure: Right IJ 19 cm Palindrome tunneled HD catheter placement.  Tips in the RA and ready for use.   Complications: None  Estimated Blood Loss: None  Recommendations: - Routine line care  Signed,  Criselda Peaches, MD

## 2019-04-13 NOTE — Progress Notes (Addendum)
Subjective:  [name] was seen and evaluated at bedside this morning. She reports that she feels well this morning and tolerated the placement of her tunnel catheter well without any issues. She endorses hunger and states that she is ready to eat. She has no other complaints or concerns. All questions and concerns were addressed.   Objective: Vital signs in last 24 hours: Vitals:   04/12/19 1619 04/12/19 1945 04/12/19 2300 04/13/19 0315  BP: 128/68 126/60 (!) 153/72 128/67  Pulse: 85 93 87 87  Resp: (!) 25 20 16 16   Temp: 99.2 F (37.3 C) 98.9 F (37.2 C) 98.8 F (37.1 C) 98.5 F (36.9 C)  TempSrc: Oral Oral Oral Oral  SpO2: 99% 98% 100% 100%  Weight:      Height:       Physical Exam Vitals and nursing note reviewed.  Constitutional:      General: She is not in acute distress.    Appearance: Normal appearance. She is not ill-appearing or toxic-appearing.     Comments: Lying comfortably in bed. No acute distress.   HENT:     Head: Normocephalic and atraumatic.  Eyes:     General:        Right eye: No discharge.        Left eye: No discharge.     Conjunctiva/sclera: Conjunctivae normal.  Cardiovascular:     Rate and Rhythm: Normal rate and regular rhythm.     Pulses: Normal pulses.     Heart sounds: Normal heart sounds. No murmur. No friction rub. No gallop.   Pulmonary:     Effort: Pulmonary effort is normal.     Breath sounds: Normal breath sounds. No wheezing, rhonchi or rales.  Abdominal:     General: Bowel sounds are normal.     Palpations: Abdomen is soft.     Tenderness: There is no abdominal tenderness. There is no guarding.  Musculoskeletal:        General: No swelling.     Right lower leg: No edema.     Left lower leg: No edema.  Neurological:     General: No focal deficit present.     Mental Status: She is alert and oriented to person, place, and time.  Psychiatric:        Mood and Affect: Mood normal.        Behavior: Behavior normal.       Assessment/Plan:  Principal Problem:   ESRD (end stage renal disease) on dialysis [facility]) Active Problems:   Acute respiratory failure with hypoxemia [facility])  [name] is a 68 y/o female, with a PMH CKD5, HTN, DM, who presented with hypoxic distress 2/2 volume overload with newfound ESRD.   Hypoxic Respirator Distress 2/2 to Pulmonary Edema/Volume Overload: - Patient saturating at 100% O2 on room air.  - Continuing to monitor.    Leukocytosis:  - leukocytosis stable at 13.4. - Patient's vitals are stable with no tachycardia and is afebrile.   ESRD with Pulmonary Edema:  - S/P Catheter placement today.  - Continue to F/U with Nephrology recommendations, we appreciate their assistance.  Hyponatremia:  Patient has been hyponatremic and has been NPO for a majority of the week.  likely 2/2 to tea and toast diet. Patient is asymptomatic.  - Will continue to monitor - Na: 127 and stable  Hyperkalemia:  - Likely due to patient ESRD with last day of dialysis of 04/11/2019 - K: 5.4 - Reordered BMP  Diabetes Mellitis:  -  Continue to monitor glucose - Continue SSI   Acute Cough:  - Continue benzonatate to 200 mg TID.  Community Acquired Pneumonia:  - Completed antibiotic course.     Dispo: Anticipated discharge pending medical course.  Maudie Mercury, MD IMTS, PGY-1 Pager: [phone] 04/13/2019,5:58 AM    Internal Medicine Attending Attestation:   I have seen and evaluated this patient and I have discussed the plan of care with the house staff. Please see their note for complete details.  Oda Kilts, MD 04/13/2019, 1:41 PM

## 2019-04-13 NOTE — Progress Notes (Signed)
**Amber Amber** Amber Amber  Assessment/ Plan: Pt is a 68 y.o. yo female with DM, HTN, presented with chest pain or shortness of breath, progressive CKD to now ESRD.  #Progressive CKD to ESRD: Started HD for severe azotemia and fluid overload.  Refractory to medical management.  Receiving HD as MWF schedule.  Status post HD on 12/18 with 500 cc UF.  Next HD on Monday. The IR procedure has been postponed multiple times per [facility] placement.  Hopefully she will get permanent HD catheter soon and then ready to be discharged. Apparently, the plan for the patient is to find nephrologist in [place] and ultimately transferred there to continue her care.  Primary team discussed with the family member yesterday.  Social worker and renal navigator following.   #Acute respiratory failure with hypoxia/pulmonary edema: UF during HD.  Improved  #Anemia of CKD: Iron saturation 28%.  Continue Aranesp during HD.  Monitor hemoglobin  #CKD-MBD: Calcium and phosphorus level low.  Increase calcitriol dose and replete Neutra-Phos.  Encourage oral intake.  PTH 269, at goal for ESRD.  Encourage oral intake  #Hyponatremia: Hypervolemic.  Monitor in HD.  #Hypertension: Continue amlodipine.  Monitor blood pressure  Subjective: Seen and examined at bedside.  Still has not gotten tunnel catheter placement.  No new event.  Lying in bed comfortable. Objective Vital signs in last 24 hours: Vitals:   04/12/19 1945 04/12/19 2300 04/13/19 0315 04/13/19 0803  BP: 126/60 (!) 153/72 128/67 133/64  Pulse: 93 87 87   Resp: 20 16 16    Temp: 98.9 F (37.2 C) 98.8 F (37.1 C) 98.5 F (36.9 C) 98.8 F (37.1 C)  TempSrc: Oral Oral Oral Oral  SpO2: 98% 100% 100%   Weight:      Height:       Weight change:  No intake or output data in the 24 hours ending 04/13/19 0909     Labs: Basic Metabolic Panel: Recent Labs  Lab 04/10/19 1020 04/11/19 0724 04/12/19 0225 04/13/19 0704  NA 133* 127* 127*  127*  K 4.4 4.7 4.6 5.4*  CL 96* 90* 88* 89*  CO2 26 23 28 25   GLUCOSE 131* 238* 248* 120*  BUN 25* 46* 21 45*  CREATININE 3.30* 4.64* 2.54* 4.41*  CALCIUM 7.9* 7.8* 7.4* 8.0*  PHOS 2.6 2.5 1.6*  --    Liver Function Tests: Recent Labs  Lab 04/10/19 1020 04/11/19 0724 04/12/19 0225  ALBUMIN 2.7* 2.5* 2.3*   No results for input(s): LIPASE, AMYLASE in the last 168 hours. No results for input(s): AMMONIA in the last 168 hours. CBC: Recent Labs  Lab 04/07/19 0508 04/09/19 0802 04/10/19 1019 04/11/19 1240 04/12/19 0657 04/13/19 0704  WBC 7.4 8.6 9.2 13.9* 13.6* 13.4*  NEUTROABS 5.1  --   --   --   --   --   HGB 9.4* 9.4* 9.9* 9.1* 9.4* 9.2*  HCT 29.1* 28.4* 30.7* 27.3* 28.8* 28.3*  MCV 86.4 84.0 86.5 85.3 85.7 86.0  PLT 130* 168 192 246 263 312   Cardiac Enzymes: No results for input(s): CKTOTAL, CKMB, CKMBINDEX, TROPONINI in the last 168 hours. CBG: Recent Labs  Lab 04/12/19 1621 04/12/19 1942 04/12/19 2350 04/13/19 0351 04/13/19 0802  GLUCAP 212* 284* 241* 95 129*    Iron Studies: No results for input(s): IRON, TIBC, TRANSFERRIN, FERRITIN in the last 72 hours. Studies/Results: No results found.  Medications: Infusions:   Scheduled Medications: . benzonatate  200 mg Oral TID  . calcitRIOL  0.5  mcg Oral Daily  . calcium carbonate  500 mg of elemental calcium Oral Q supper  . Chlorhexidine Gluconate Cloth  6 each Topical Q0600  . darbepoetin (ARANESP) injection - DIALYSIS  40 mcg Intravenous Q Wed-HD  . famotidine  20 mg Oral Daily  . feeding supplement (ENSURE ENLIVE)  237 mL Oral TID BM  . fentaNYL      . gelatin adsorbable      . heparin      . influenza vaccine adjuvanted  0.5 mL Intramuscular Tomorrow-1000  . insulin aspart  0-6 Units Subcutaneous Q4H  . lidocaine      . mouth rinse  15 mL Mouth Rinse BID  . midazolam      . multivitamin  1 tablet Oral QHS  . phosphorus  500 mg Oral Daily  . pneumococcal 23 valent vaccine  0.5 mL  Intramuscular Tomorrow-1000    have reviewed scheduled and prn medications.  Physical Exam: General: Not in distress, able to lie flat Heart:RRR, s1s2 nl Lungs: Clear b/l, no crackle Abdomen:soft, Non-tender, non-distended Extremities:No LE edema Dialysis Access: Right IJ temporary catheter placed by IR on 12/9.  Camdin Hegner Prasad Marayah Higdon 04/13/2019,9:09 AM  LOS: 13 days  Pager: [phone]

## 2019-04-14 DIAGNOSIS — E875 Hyperkalemia: Secondary | ICD-10-CM

## 2019-04-14 LAB — CBC
HCT: 26.1 % — ABNORMAL LOW (ref 36.0–46.0)
Hemoglobin: 8.5 g/dL — ABNORMAL LOW (ref 12.0–15.0)
MCH: 28 pg (ref 26.0–34.0)
MCHC: 32.6 g/dL (ref 30.0–36.0)
MCV: 85.9 fL (ref 80.0–100.0)
Platelets: 334 10*3/uL (ref 150–400)
RBC: 3.04 MIL/uL — ABNORMAL LOW (ref 3.87–5.11)
RDW: 13.8 % (ref 11.5–15.5)
WBC: 11.2 10*3/uL — ABNORMAL HIGH (ref 4.0–10.5)
nRBC: 0 % (ref 0.0–0.2)

## 2019-04-14 LAB — RENAL FUNCTION PANEL
Albumin: 2.3 g/dL — ABNORMAL LOW (ref 3.5–5.0)
Anion gap: 13 (ref 5–15)
BUN: 58 mg/dL — ABNORMAL HIGH (ref 8–23)
CO2: 24 mmol/L (ref 22–32)
Calcium: 7.7 mg/dL — ABNORMAL LOW (ref 8.9–10.3)
Chloride: 89 mmol/L — ABNORMAL LOW (ref 98–111)
Creatinine, Ser: 5.82 mg/dL — ABNORMAL HIGH (ref 0.44–1.00)
GFR calc Af Amer: 8 mL/min — ABNORMAL LOW (ref >60)
GFR calc non Af Amer: 7 mL/min — ABNORMAL LOW (ref >60)
Glucose, Bld: 107 mg/dL — ABNORMAL HIGH (ref 70–99)
Phosphorus: 4.5 mg/dL (ref 2.5–4.6)
Potassium: 5.3 mmol/L — ABNORMAL HIGH (ref 3.5–5.1)
Sodium: 126 mmol/L — ABNORMAL LOW (ref 135–145)

## 2019-04-14 LAB — GLUCOSE, CAPILLARY
Glucose-Capillary: 160 mg/dL — ABNORMAL HIGH (ref 70–99)
Glucose-Capillary: 88 mg/dL (ref 70–99)
Glucose-Capillary: 108 mg/dL — ABNORMAL HIGH (ref 70–99)
Glucose-Capillary: 228 mg/dL — ABNORMAL HIGH (ref 70–99)
Glucose-Capillary: 88 mg/dL (ref 70–99)
Glucose-Capillary: 163 mg/dL — ABNORMAL HIGH (ref 70–99)
Glucose-Capillary: 109 mg/dL — ABNORMAL HIGH (ref 70–99)

## 2019-04-14 MED ORDER — LIDOCAINE-PRILOCAINE 2.5-2.5 % EX CREA: 1.0000 "application " | TOPICAL_CREAM | CUTANEOUS | Status: DC | PRN

## 2019-04-14 MED ORDER — ALTEPLASE 2 MG IJ SOLR: 2.0000 mg | Freq: Once | INTRAMUSCULAR | Status: DC | PRN

## 2019-04-14 MED ORDER — SODIUM CHLORIDE 0.9 % IV SOLN: 100.0000 mL | INTRAVENOUS | Status: DC | PRN

## 2019-04-14 MED ORDER — SODIUM ZIRCONIUM CYCLOSILICATE 5 G PO PACK
5.0000 g | PACK | Freq: Two times a day (BID) | ORAL | Status: AC
  Administered 2019-04-14: 5 g via ORAL
  Filled 2019-04-14: qty 1

## 2019-04-14 MED ORDER — PENTAFLUOROPROP-TETRAFLUOROETH EX AERO: 1.0000 "application " | INHALATION_SPRAY | CUTANEOUS | Status: DC | PRN

## 2019-04-14 MED ORDER — LIDOCAINE HCL (PF) 1 % IJ SOLN: 5.0000 mL | INTRAMUSCULAR | Status: DC | PRN

## 2019-04-14 MED ORDER — HEPARIN SODIUM (PORCINE) 1000 UNIT/ML DIALYSIS
1000.0000 [IU] | INTRAMUSCULAR | Status: DC | PRN
  Administered 2019-04-14: 3200 [IU] via INTRAVENOUS_CENTRAL

## 2019-04-14 MED ORDER — HEPARIN SODIUM (PORCINE) 1000 UNIT/ML IJ SOLN
INTRAMUSCULAR | Status: AC
  Filled 2019-04-14: qty 4

## 2019-04-14 NOTE — Progress Notes (Signed)
PT Cancellation Note  Patient Details Name: Amber Dixon MRN: [id number] DOB: 03-01-1951   Cancelled Treatment:    Reason Eval/Treat Not Completed: Patient at procedure or test/unavailable patient currently at HD. Will attempt to try back later if time/schedule allow.   Windell Norfolk, DPT, PN1   Supplemental Physical Therapist [facility]    Pager [phone] Acute Rehab Office [phone]

## 2019-04-14 NOTE — Progress Notes (Signed)
[facility] NEPHROLOGY PROGRESS NOTE  Assessment/ Plan: Pt is a 68 y.o. yo female with DM, HTN, presented with chest pain or shortness of breath, progressive CKD to now ESRD.  #Progressive CKD to ESRD: Started HD for severe azotemia and fluid overload.  Refractory to medical management.  Receiving HD as MWF schedule.  Status post right IJ TDC placed by IR on 12/20.  Plan for next HD today as per her schedule. Apparently, the plan for the patient is to find nephrologist in [place] and ultimately transferred there to continue her care.  Primary team discussed with the family member. Social worker and renal navigator following.   #Acute respiratory failure with hypoxia/pulmonary edema: UF during HD.  Improved  #Anemia of CKD: Iron saturation 28%.  Continue Aranesp during HD.  Monitor hemoglobin  #CKD-MBD: Calcium and phosphorus level low.  Increase calcitriol dose and replete Neutra-Phos.  Encourage oral intake.  PTH 269, at goal for ESRD.  Encourage oral intake  #Hyponatremia: Hypervolemic.  Monitor in HD.  #Hypertension: Continue amlodipine.  Monitor blood pressure  Subjective: Seen and examined at bedside.  Had right IJ TDC placed by IR yesterday.  No new event.  Plan for HD today. Objective Vital signs in last 24 hours: Vitals:   04/13/19 1924 04/13/19 2312 04/14/19 0355 04/14/19 0737  BP: 135/67 131/66 134/65 (!) 148/74  Pulse: 94 91 85 88  Resp: 19 14 12 16   Temp: 99.2 F (37.3 C) 99.1 F (37.3 C) 98.9 F (37.2 C) 98.9 F (37.2 C)  TempSrc: Oral Oral Oral Oral  SpO2: 98% 98% 96% 97%  Weight:      Height:       Weight change:   Intake/Output Summary (Last 24 hours) at 04/14/2019 0955 Last data filed at 04/14/2019 0908 Gross per 24 hour  Intake 240 ml  Output -  Net 240 ml       Labs: Basic Metabolic Panel: Recent Labs  Lab 04/11/19 0724 04/12/19 0225 04/13/19 1215 04/13/19 2042 04/14/19 0236  NA 127* 127* 127* 126* 126*  K 4.7 4.6 5.2*  5.4* 5.3*  CL 90* 88* 89* 89* 89*  CO2 23 28 26 25 24   GLUCOSE 238* 248* 117* 158* 107*  BUN 46* 21 47* 55* 58*  CREATININE 4.64* 2.54* 4.79* 5.46* 5.82*  CALCIUM 7.8* 7.4* 7.7* 7.6* 7.7*  PHOS 2.5 1.6*  --   --  4.5   Liver Function Tests: Recent Labs  Lab 04/11/19 0724 04/12/19 0225 04/14/19 0236  ALBUMIN 2.5* 2.3* 2.3*   No results for input(s): LIPASE, AMYLASE in the last 168 hours. No results for input(s): AMMONIA in the last 168 hours. CBC: Recent Labs  Lab 04/10/19 1019 04/11/19 1240 04/12/19 0657 04/13/19 0704 04/14/19 0236  WBC 9.2 13.9* 13.6* 13.4* 11.2*  HGB 9.9* 9.1* 9.4* 9.2* 8.5*  HCT 30.7* 27.3* 28.8* 28.3* 26.1*  MCV 86.5 85.3 85.7 86.0 85.9  PLT 192 246 263 312 334   Cardiac Enzymes: No results for input(s): CKTOTAL, CKMB, CKMBINDEX, TROPONINI in the last 168 hours. CBG: Recent Labs  Lab 04/13/19 1611 04/13/19 1955 04/14/19 0107 04/14/19 0351 04/14/19 0736  GLUCAP 148* 188* 160* 88 108*    Iron Studies: No results for input(s): IRON, TIBC, TRANSFERRIN, FERRITIN in the last 72 hours. Studies/Results: IR Fluoro Guide CV Line Right  Result Date: 04/13/2019 INDICATION: 68 year old female with end-stage renal disease on hemodialysis via a right IJ temporary hemodialysis catheter. She presents today for conversion to a tunneled hemodialysis  catheter. EXAM: TUNNELED CENTRAL VENOUS HEMODIALYSIS CATHETER PLACEMENT WITH ULTRASOUND AND FLUOROSCOPIC GUIDANCE MEDICATIONS: None. ANESTHESIA/SEDATION: Moderate (conscious) sedation was employed during this procedure. A total of Versed 1 mg and Fentanyl 50 mcg was administered intravenously. Moderate Sedation Time: 13 minutes. The patient's level of consciousness and vital signs were monitored continuously by radiology nursing throughout the procedure under my direct supervision. FLUOROSCOPY TIME:  Fluoroscopy Time: 0 minutes 18 seconds (1 mGy). COMPLICATIONS: None immediate. PROCEDURE: Informed written consent was  obtained from the patient after a discussion of the risks, benefits, and alternatives to treatment. Questions regarding the procedure were encouraged and answered. The right neck and chest were prepped with chlorhexidine in a sterile fashion, and a sterile drape was applied covering the operative field. Maximum barrier sterile technique with sterile gowns and gloves were used for the procedure. A timeout was performed prior to the initiation of the procedure. The existing 16 cm non tunneled dialysis catheter was evaluated under fluoroscopy and the tip found to be well-positioned. Local anesthesia was attained by infiltration with 1% lidocaine. A 0.035 wire was advanced through the existing catheter and into the inferior vena cava. A suitable skin exit site inferior to the clavicle was anesthetized with 1% lidocaine and a small dermatotomy was made. A Palindrome tunneled hemodialysis catheter measuring 19 cm from tip to cuff was tunneled in a retrograde fashion from the anterior chest wall to the venotomy incision. The existing catheter was then removed over the wire and a 14 French peel-away sheath advanced over the wire and positioned in the right heart. The catheter was then placed through the peel-away sheath with tips ultimately positioned within the superior aspect of the right atrium. Final catheter positioning was confirmed and documented with a spot radiographic image. The catheter aspirates and flushes normally. The catheter was flushed with appropriate volume heparin dwells. The catheter exit site was secured with a 0-Prolene retention suture. The venotomy incision was closed with Dermabond. Dressings were applied. The patient tolerated the procedure well without immediate post procedural complication. IMPRESSION: Successful placement of 19 cm tip to cuff tunneled hemodialysis catheter via the right internal jugular vein with tips terminating within the superior aspect of the right atrium. The catheter is  ready for immediate use. Electronically Signed   By: Jacqulynn Cadet M.D.   On: 04/13/2019 10:59    Medications: Infusions:   Scheduled Medications: . benzonatate  200 mg Oral TID  . calcitRIOL  0.5 mcg Oral Daily  . calcium carbonate  500 mg of elemental calcium Oral Q supper  . Chlorhexidine Gluconate Cloth  6 each Topical Q0600  . Chlorhexidine Gluconate Cloth  6 each Topical Q0600  . darbepoetin (ARANESP) injection - DIALYSIS  40 mcg Intravenous Q Wed-HD  . famotidine  20 mg Oral Daily  . feeding supplement (ENSURE ENLIVE)  237 mL Oral TID BM  . insulin aspart  0-6 Units Subcutaneous Q4H  . mouth rinse  15 mL Mouth Rinse BID  . multivitamin  1 tablet Oral QHS  . phosphorus  500 mg Oral Daily    have reviewed scheduled and prn medications.  Physical Exam: General: Not in distress, able to lie flat Heart:RRR, s1s2 nl Lungs: Clear b/l, no crackle Abdomen:soft, Non-tender, non-distended Extremities:No LE edema Dialysis Access: Right IJ TDC  Dron Prasad Bhandari 04/14/2019,9:55 AM  LOS: 14 days  Pager: [phone]

## 2019-04-14 NOTE — Progress Notes (Signed)
Subjective: HD#14 Events Overnight: No events overnight  Patient was seen this morning on rounds. She states that she feels "fine." She tolerated the placement of her tunnel catheter well yesterday without any difficulties. She reports that she feels "smashed, torn and crushed." She hasn't had the chance to talk about her family yet. She endorses weakness. She reports that her family plans on taking her "to a different hospital" after discharge but she is unaware of the name of the hospital.   Objective:  Vital signs in last 24 hours: Vitals:   04/13/19 1658 04/13/19 1924 04/13/19 2312 04/14/19 0355  BP: (!) 146/68 135/67 131/66 134/65  Pulse:  94 91 85  Resp:  19 14 12   Temp: 99 F (37.2 C) 99.2 F (37.3 C) 99.1 F (37.3 C) 98.9 F (37.2 C)  TempSrc: Oral Oral Oral Oral  SpO2:  98% 98% 96%  Weight:      Height:        Physical Exam: Physical Exam  Constitutional: She is oriented to person, place, and time.  HENT:  Head: Normocephalic and atraumatic.  Eyes: EOM are normal.  Cardiovascular: Normal rate, regular rhythm and intact distal pulses. Exam reveals no gallop and no friction rub.  No murmur heard. Pulmonary/Chest: Effort normal and breath sounds normal. No respiratory distress. She exhibits no tenderness.  Abdominal: Soft. Bowel sounds are normal. She exhibits no distension. There is no abdominal tenderness.  Musculoskeletal:        General: No tenderness or edema. Normal range of motion.     Cervical back: Normal range of motion.  Neurological: She is alert and oriented to person, place, and time.  Skin: Skin is warm and dry. She is not diaphoretic.    Filed Weights   04/09/19 1120 04/09/19 1427 04/11/19 1230  Weight: 30.5 kg 30.2 kg 30.2 kg     Intake/Output Summary (Last 24 hours) at 04/14/2019 0532 Last data filed at 04/13/2019 0803 Gross per 24 hour  Intake 0 ml  Output --  Net 0 ml    Pertinent labs/Imaging: CBC Latest Ref Rng & Units 04/14/2019  04/13/2019 04/12/2019  WBC 4.0 - 10.5 K/uL 11.2(H) 13.4(H) 13.6(H)  Hemoglobin 12.0 - 15.0 g/dL 8.5(L) 9.2(L) 9.4(L)  Hematocrit 36.0 - 46.0 % 26.1(L) 28.3(L) 28.8(L)  Platelets 150 - 400 K/uL 334 312 263    CMP Latest Ref Rng & Units 04/14/2019 04/13/2019 04/13/2019  Glucose 70 - 99 mg/dL 107(H) 158(H) 117(H)  BUN 8 - 23 mg/dL 58(H) 55(H) 47(H)  Creatinine 0.44 - 1.00 mg/dL 5.82(H) 5.46(H) 4.79(H)  Sodium 135 - 145 mmol/L 126(L) 126(L) 127(L)  Potassium 3.5 - 5.1 mmol/L 5.3(H) 5.4(H) 5.2(H)  Chloride 98 - 111 mmol/L 89(L) 89(L) 89(L)  CO2 22 - 32 mmol/L 24 25 26   Calcium 8.9 - 10.3 mg/dL 7.7(L) 7.6(L) 7.7(L)  Total Protein 6.5 - 8.1 g/dL - - -  Total Bilirubin 0.3 - 1.2 mg/dL - - -  Alkaline Phos 38 - 126 U/L - - -  AST 15 - 41 U/L - - -  ALT 0 - 44 U/L - - -    IR Fluoro Guide CV Line Right  Result Date: 04/13/2019 INDICATION: 68 year old female with end-stage renal disease on hemodialysis via a right IJ temporary hemodialysis catheter. She presents today for conversion to a tunneled hemodialysis catheter. EXAM: TUNNELED CENTRAL VENOUS HEMODIALYSIS CATHETER PLACEMENT WITH ULTRASOUND AND FLUOROSCOPIC GUIDANCE MEDICATIONS: None. ANESTHESIA/SEDATION: Moderate (conscious) sedation was employed during this procedure. A total of Versed  1 mg and Fentanyl 50 mcg was administered intravenously. Moderate Sedation Time: 13 minutes. The patient's level of consciousness and vital signs were monitored continuously by radiology nursing throughout the procedure under my direct supervision. FLUOROSCOPY TIME:  Fluoroscopy Time: 0 minutes 18 seconds (1 mGy). COMPLICATIONS: None immediate. PROCEDURE: Informed written consent was obtained from the patient after a discussion of the risks, benefits, and alternatives to treatment. Questions regarding the procedure were encouraged and answered. The right neck and chest were prepped with chlorhexidine in a sterile fashion, and a sterile drape was applied covering  the operative field. Maximum barrier sterile technique with sterile gowns and gloves were used for the procedure. A timeout was performed prior to the initiation of the procedure. The existing 16 cm non tunneled dialysis catheter was evaluated under fluoroscopy and the tip found to be well-positioned. Local anesthesia was attained by infiltration with 1% lidocaine. A 0.035 wire was advanced through the existing catheter and into the inferior vena cava. A suitable skin exit site inferior to the clavicle was anesthetized with 1% lidocaine and a small dermatotomy was made. A Palindrome tunneled hemodialysis catheter measuring 19 cm from tip to cuff was tunneled in a retrograde fashion from the anterior chest wall to the venotomy incision. The existing catheter was then removed over the wire and a 14 French peel-away sheath advanced over the wire and positioned in the right heart. The catheter was then placed through the peel-away sheath with tips ultimately positioned within the superior aspect of the right atrium. Final catheter positioning was confirmed and documented with a spot radiographic image. The catheter aspirates and flushes normally. The catheter was flushed with appropriate volume heparin dwells. The catheter exit site was secured with a 0-Prolene retention suture. The venotomy incision was closed with Dermabond. Dressings were applied. The patient tolerated the procedure well without immediate post procedural complication. IMPRESSION: Successful placement of 19 cm tip to cuff tunneled hemodialysis catheter via the right internal jugular vein with tips terminating within the superior aspect of the right atrium. The catheter is ready for immediate use. Electronically Signed   By: Jacqulynn Cadet M.D.   On: 04/13/2019 10:59    Assessment/Plan:  Principal Problem:   ESRD (end stage renal disease) on dialysis [facility]) Active Problems:   Acute respiratory failure with hypoxemia [facility])   Patient  Summary: Amber Dixon is a 68 y.o. with pertinent PMH of CKD stage V, HTN, DM, who was admit for ESRD on hospital day 14  Hypoxic Respirator Distress 2/2 to Pulmonary Edema/Volume Overload: -SPO2 100% on room air  Leukocytosis:  - leukocytosis trending downwards at 11.2 today -Does not meet any symptoms of infection.  Afebrile and not tachycardic   ESRD with Pulmonary Edema:  - Status post catheter placement as of yesterday - Appreciate nephrology's recommendations  Hyponatremia:  Patient has stable hyponatremia secondary to decreased p.o. intake. -Stable today at 26 we will continue to monitor   Hyperkalemia:  -Likely secondary to ESRD recently started dialysis -Stable today at 5.3 -We will continue to monitor  Diabetes Mellitis:  - Continue to monitor glucose - Continue SSI  Acute Cough:  - Continue benzonatate to 200 mg TID.  Community Acquired Pneumonia:  - Completed antibiotic course.    Diet: Renal IVF: none VTE: Heparin  Code: Full  Dispo: Anticipated discharge today.    Marianna Payment, D.O. [facility], PGY-1 Date 04/14/2019 Time 5:32 AM Pager: [phone]

## 2019-04-15 LAB — CBC
HCT: 25.3 % — ABNORMAL LOW (ref 36.0–46.0)
Hemoglobin: 8.2 g/dL — ABNORMAL LOW (ref 12.0–15.0)
MCH: 27.9 pg (ref 26.0–34.0)
MCHC: 32.4 g/dL (ref 30.0–36.0)
MCV: 86.1 fL (ref 80.0–100.0)
Platelets: 280 10*3/uL (ref 150–400)
RBC: 2.94 MIL/uL — ABNORMAL LOW (ref 3.87–5.11)
RDW: 13.7 % (ref 11.5–15.5)
WBC: 7.7 10*3/uL (ref 4.0–10.5)
nRBC: 0 % (ref 0.0–0.2)

## 2019-04-15 LAB — GLUCOSE, CAPILLARY
Glucose-Capillary: 215 mg/dL — ABNORMAL HIGH (ref 70–99)
Glucose-Capillary: 75 mg/dL (ref 70–99)
Glucose-Capillary: 251 mg/dL — ABNORMAL HIGH (ref 70–99)
Glucose-Capillary: 170 mg/dL — ABNORMAL HIGH (ref 70–99)
Glucose-Capillary: 176 mg/dL — ABNORMAL HIGH (ref 70–99)
Glucose-Capillary: 217 mg/dL — ABNORMAL HIGH (ref 70–99)

## 2019-04-15 MED ORDER — CHLORHEXIDINE GLUCONATE CLOTH 2 % EX PADS: 6.0000 | MEDICATED_PAD | Freq: Every day | CUTANEOUS | Status: DC

## 2019-04-15 NOTE — Progress Notes (Addendum)
   Subjective:  Amber Dixon is a 68 y.o. with PMH of DM, HTN, CKD admit for acute on chronic renal failure on hospital day 60  Amber Dixon is seen at bedside on rounds today. She sitting up in bed eating breakfast. She states she is feeling well without nausea, abdominal pain or SOB. She has some pain in her neck where the line was placed for dialysis. We discussed why she is here in the hospital and she does not appear to understand that her kidneys are no longer working. We tried to explain this to her. She says she would like to leave the hospital as she is bored and tired of being here which we understand given she has been here for over 2 weeks. We told the patient we would talk about her discharge plan with her son.   Objective:  Vital signs in last 24 hours: Vitals:   04/14/19 1930 04/14/19 2307 04/15/19 0300 04/15/19 0737  BP: (!) 174/70 (!) 141/64 (!) 155/71 131/64  Pulse: 94 94 97 88  Resp: 15 10  17   Temp: 98.9 F (37.2 C) 99.3 F (37.4 C) 99.8 F (37.7 C) 98.4 F (36.9 C)  TempSrc: Oral Oral Oral Oral  SpO2: 98%  98% 100%  Weight:      Height:       Gen: Thin-appearing, NAD HEENT: NCAT head, hearing intact, EOMI, No nasal discharge, MMM Neck: supple, ROM intact, catheter in place without drainage, surrounding erythema or edema CV: RRR, S1, S2 normal, No rubs, no murmurs, no gallops Pulm: CTAB, No rales, no wheezes Abd: Soft, BS+, NTND, No rebound, no guarding Extm: ROM intact, Peripheral pulses intact, No peripheral edema Skin: Dry, Warm, normal turgor  Assessment/Plan:  Principal Problem:   ESRD (end stage renal disease) on dialysis [facility]) Active Problems:   Acute respiratory failure with hypoxemia ([place])  Amber Dixon is a 68 y.o. with pertinent PMH of CKD stage V, HTN, DM admit for worsening renal failure and pneumonia. At this point, no-clear discharge plan due to lack of payer source for outpatient dialysis. Recommendation  per case management was discharge with recommendation to [place] for dialysis there but family states they lack the means. Amber Dixon has poor understanding of severity of her disease and does not understand that her renal function will not recover. Current options include continuing inpatient stay until end-of-life as she can only get dialysis as inpatient versus discharge home with home hospice. Will continue to discuss with family and get ethics committee on board.  ESRD with Pulmonary Edema S/p tunneled cath placement on 04/13/2019. Dialyzed yesterday. Appear euvolemic today. Discussed case with ethic committee who will convene and provide recommendations - Appreciate ethics committee support - HD per nephro - Trend electrolytes  Diabetes Mellitus Am cbg 75 - Continue to monitor glucose - Continue SSI  Diet: Renal IVF: none VTE: Heparin  Code: Full  Dispo: Anticipated discharge once safer discharge plan is made  Mosetta Anis, MD 04/15/2019, 9:41 AM Pager: [phone]

## 2019-04-15 NOTE — Progress Notes (Addendum)
Discussed case with ethics committee. Committee member on call, Nira Conn, mentions clarifying severity of disease with family and attempt to clarify situation in [place] regarding transition of care. Recommended continued conversation with family regarding discharge planning.   Had another goals of care conversation with Mrs.Rodriguez's son with the aid of her daughter-in-law. Family confirms that they are unable to send Mrs.[name] back to [place] due to the severity of her disease and states they are planning on having her stay with them for the foreseeable future. They mention that they have recently applied for financial assistance at [facility] with aid of financial counselor, Bishop Dublin [phone]). Discussed goals of care including palliatiation vs continued full scope of care with dialysis and inability to discharge safely without outpatient dialysis. Family was clear that they wanted full scope of care for Mrs.[name] at the moment.  Attempted to speak with Mrs.Davilla regarding process of financial support. Went to [employer]. Voicemail left with callback number.  Spoke with Levada Dy with social work team regarding possibility of getting immigration involved to change her legal status so she may approve for emergency medicaid. She will discuss with rest of the team regarding possible avenues.   Gilberto Better, PGY-2 [facility] Pager: [phone]

## 2019-04-15 NOTE — Progress Notes (Signed)
[facility] NEPHROLOGY PROGRESS NOTE  Assessment/ Plan: Pt is a 68 y.o. yo female with DM, HTN, presented with chest pain or shortness of breath, progressive CKD to now ESRD.  #Progressive CKD to ESRD: Started HD for severe azotemia and fluid overload.  Refractory to medical management.  Receiving HD as MWF schedule.  Status post right IJ TDC placed by IR on 12/20.   Discharge issue is complicated because of her status in the [place].  Social worker and Tourist information centre manager involved.  One possibility was to find a nephrologist in [place] and ultimately transfer there to continue her dialysis.  Noted the primary team is considering ethics consult.  #Acute respiratory failure with hypoxia/pulmonary edema: UF during HD.  Improved  #Anemia of CKD: Iron saturation 28%.  Continue Aranesp during HD.  Monitor hemoglobin  #CKD-MBD: Phosphorus level improved.  Continue calcitriol.  Discontinue Neutra-Phos.  Encourage oral intake.  PTH 269, at goal for ESRD.  Encourage oral intake  #Hyponatremia: Hypervolemic.  Monitor in HD.  #Hypertension: Continue amlodipine.  Monitor blood pressure  Subjective: Seen and examined at bedside.  She had dialysis yesterday with around 400 cc UF.  No new event.    Objective Vital signs in last 24 hours: Vitals:   04/14/19 1930 04/14/19 2307 04/15/19 0300 04/15/19 0737  BP: (!) 174/70 (!) 141/64 (!) 155/71 131/64  Pulse: 94 94 97 88  Resp: 15 10  17   Temp: 98.9 F (37.2 C) 99.3 F (37.4 C) 99.8 F (37.7 C) 98.4 F (36.9 C)  TempSrc: Oral Oral Oral Oral  SpO2: 98%  98% 100%  Weight:      Height:       Weight change:   Intake/Output Summary (Last 24 hours) at 04/15/2019 1018 Last data filed at 04/14/2019 1647 Gross per 24 hour  Intake --  Output 399 ml  Net -399 ml       Labs: Basic Metabolic Panel: Recent Labs  Lab 04/11/19 0724 04/12/19 0225 04/13/19 1215 04/13/19 2042 04/14/19 0236  NA 127* 127* 127* 126* 126*  K 4.7 4.6 5.2* 5.4*  5.3*  CL 90* 88* 89* 89* 89*  CO2 23 28 26 25 24   GLUCOSE 238* 248* 117* 158* 107*  BUN 46* 21 47* 55* 58*  CREATININE 4.64* 2.54* 4.79* 5.46* 5.82*  CALCIUM 7.8* 7.4* 7.7* 7.6* 7.7*  PHOS 2.5 1.6*  --   --  4.5   Liver Function Tests: Recent Labs  Lab 04/11/19 0724 04/12/19 0225 04/14/19 0236  ALBUMIN 2.5* 2.3* 2.3*   No results for input(s): LIPASE, AMYLASE in the last 168 hours. No results for input(s): AMMONIA in the last 168 hours. CBC: Recent Labs  Lab 04/11/19 1240 04/12/19 0657 04/13/19 0704 04/14/19 0236 04/15/19 0505  WBC 13.9* 13.6* 13.4* 11.2* 7.7  HGB 9.1* 9.4* 9.2* 8.5* 8.2*  HCT 27.3* 28.8* 28.3* 26.1* 25.3*  MCV 85.3 85.7 86.0 85.9 86.1  PLT 246 263 312 334 280   Cardiac Enzymes: No results for input(s): CKTOTAL, CKMB, CKMBINDEX, TROPONINI in the last 168 hours. CBG: Recent Labs  Lab 04/14/19 1709 04/14/19 1929 04/14/19 2306 04/15/19 0311 04/15/19 0736  GLUCAP 88 163* 109* 215* 75    Iron Studies: No results for input(s): IRON, TIBC, TRANSFERRIN, FERRITIN in the last 72 hours. Studies/Results: No results found.  Medications: Infusions:   Scheduled Medications: . benzonatate  200 mg Oral TID  . calcitRIOL  0.5 mcg Oral Daily  . calcium carbonate  500 mg of elemental calcium  Oral Q supper  . Chlorhexidine Gluconate Cloth  6 each Topical Q0600  . darbepoetin (ARANESP) injection - DIALYSIS  40 mcg Intravenous Q Wed-HD  . famotidine  20 mg Oral Daily  . feeding supplement (ENSURE ENLIVE)  237 mL Oral TID BM  . insulin aspart  0-6 Units Subcutaneous Q4H  . mouth rinse  15 mL Mouth Rinse BID  . multivitamin  1 tablet Oral QHS  . phosphorus  500 mg Oral Daily    have reviewed scheduled and prn medications.  Physical Exam: General: Not in distress, able to lie flat Heart:RRR, s1s2 nl Lungs: Clear b/l, no crackle Abdomen:soft, Non-tender, non-distended Extremities:No LE edema Dialysis Access: Right IJ TDC  Amber Dixon  Amber Dixon 04/15/2019,10:18 AM  LOS: 15 days  Pager: [phone]

## 2019-04-15 NOTE — Progress Notes (Signed)
NCM received consult: 68 year old female with ESRD. She is a citizen of [place] and needs assistance finding outpatient dialysis. We have been in contact with the patient's family and they made [place] aware that she cannot go back to [place] as there is no one to take care of her in [place]. NCM reached out to Nurse Renal Navigator/ Ledon Snare states  pt is here on a travel visa, and  is not  eliglible for any financial  coverage and pt can't pt privately. The best scenario would be for pt to return to [place] and received treatment there. NCM to f/u with [facility] supervisor for direction.... Whitman Hero RN,BSN,CM [phone]

## 2019-04-15 NOTE — Evaluation (Signed)
Physical Therapy Evaluation Patient Details Name: Amber Dixon MRN: [id number] DOB: Jan 21, 1951 Today's Date: 04/15/2019   History of Present Illness  68 yo female with onset of fluid overload, new HD need, PNA, SOB, renal failure and low Na+ was admitted and requires Spanish translation.  Covid (-).  Has PMHx:  R hip fracture with ORIF in [place], ESRD not with HD, HTN, DM,   Clinical Impression  Pt was seen with Spanish translation for both she and son, who speaks very little [place].  Pt is feeling light headed with any movement, but did not find BP, pulse or sat of O2 to be a factor during movement.  Pt is weaker residually from old R hip surgery, and will need to continue to work on this strength.  However, her family is able to take her home to an all level environment and use her RW from previous to this.  Will see her acutely as home therapy may be a challenge with her ins status.  Her follow up may be able to carry on with an open door therapy service.      Follow Up Recommendations Home health PT;Supervision for mobility/OOB;Supervision/Assistance - 24 hour    Equipment Recommendations  None recommended by PT    Recommendations for Other Services       Precautions / Restrictions Precautions Precautions: Fall Precaution Comments: RLE weak Restrictions Weight Bearing Restrictions: No      Mobility  Bed Mobility Overal bed mobility: Needs Assistance Bed Mobility: Supine to Sit;Sit to Supine     Supine to sit: Min assist Sit to supine: Min guard;Min assist   General bed mobility comments: min assist to support trunk and get to side of bed, min guard to steady and min assist for assisting trunk back to bed  Transfers Overall transfer level: Needs assistance Equipment used: Rolling walker (2 wheeled);1 person hand held assist Transfers: Sit to/from Stand Sit to Stand: Min assist         General transfer comment: min assist to power up and control  initial balance, then min guard  Ambulation/Gait Ambulation/Gait assistance: Min assist;Min guard Gait Distance (Feet): 35 Feet Assistive device: Rolling walker (2 wheeled);1 person hand held assist;IV Pole Gait Pattern/deviations: Step-through pattern;Decreased stride length;Wide base of support;Trunk flexed Gait velocity: reduced, controlled Gait velocity interpretation: <1.31 ft/sec, indicative of household ambulator General Gait Details: slow pace with monitoring of O2 sats and pulses, BP in sitting was 103/83  Stairs Stairs: (deferred)          Wheelchair Mobility    Modified Rankin (Stroke Patients Only)       Balance Overall balance assessment: Needs assistance Sitting-balance support: Feet supported Sitting balance-Leahy Scale: Fair     Standing balance support: Bilateral upper extremity supported;During functional activity Standing balance-Leahy Scale: Poor Standing balance comment: pt is weak on RLE and generally weak                             Pertinent Vitals/Pain Pain Assessment: Faces Faces Pain Scale: Hurts a little bit Pain Location: R hip with initial movement Pain Descriptors / Indicators: Guarding Pain Intervention(s): Limited activity within patient's tolerance;Monitored during session;Repositioned    Home Living Family/patient expects to be discharged to:: Private residence Living Arrangements: Children Available Help at Discharge: Family;Available 24 hours/day Type of Home: House Home Access: Level entry     Home Layout: One level Home Equipment: Walker - 2 wheels Additional  Comments: pt was still on RW since R hip ORIF from a few years ago    Prior Function Level of Independence: Independent with assistive device(s)               Hand Dominance   Dominant Hand: Right    Extremity/Trunk Assessment   Upper Extremity Assessment Upper Extremity Assessment: Overall WFL for tasks assessed    Lower Extremity  Assessment Lower Extremity Assessment: RLE deficits/detail RLE Deficits / Details: 3+ to 4- strength RLE Coordination: decreased gross motor    Cervical / Trunk Assessment Cervical / Trunk Assessment: Normal  Communication   Communication: Prefers language other than [place];Interpreter utilized  Cognition Arousal/Alertness: Awake/alert Behavior During Therapy: WFL for tasks assessed/performed Overall Cognitive Status: Within Functional Limits for tasks assessed                                 General Comments: based on conversation between translator and pt with her son, was able to give her history      General Comments General comments (skin integrity, edema, etc.): pt was able to demosntrate controlled pulses and sats, BP was supportive of sitting on bed despite feeling light headed    Exercises     Assessment/Plan    PT Assessment Patient needs continued PT services  PT Problem List Decreased strength;Decreased range of motion;Decreased activity tolerance;Decreased balance;Decreased mobility;Decreased coordination;Decreased safety awareness;Cardiopulmonary status limiting activity;Pain       PT Treatment Interventions DME instruction;Gait training;Functional mobility training;Therapeutic activities;Therapeutic exercise;Balance training;Neuromuscular re-education;Patient/family education    PT Goals (Current goals can be found in the Care Plan section)  Acute Rehab PT Goals Patient Stated Goal: to feel better PT Goal Formulation: With patient/family Time For Goal Achievement: 04/29/19 Potential to Achieve Goals: Good    Frequency Min 3X/week   Barriers to discharge   has home with stairs but can stay with son where no stairs are and have help 24/7    Co-evaluation               AM-PAC PT "6 Clicks" Mobility  Outcome Measure Help needed turning from your back to your side while in a flat bed without using bedrails?: None Help needed moving  from lying on your back to sitting on the side of a flat bed without using bedrails?: A Little Help needed moving to and from a bed to a chair (including a wheelchair)?: A Little Help needed standing up from a chair using your arms (e.g., wheelchair or bedside chair)?: A Little Help needed to walk in hospital room?: A Little Help needed climbing 3-5 steps with a railing? : A Lot 6 Click Score: 18    End of Session Equipment Utilized During Treatment: Gait belt Activity Tolerance: Patient limited by fatigue;Treatment limited secondary to medical complications (Comment) Patient left: in bed;with call bell/phone within reach;with bed alarm set;with family/visitor present Nurse Communication: Mobility status PT Visit Diagnosis: Unsteadiness on feet (R26.81);Muscle weakness (generalized) (M62.81);Difficulty in walking, not elsewhere classified (R26.2);Dizziness and giddiness (R42)    Time: [id number] PT Time Calculation (min) (ACUTE ONLY): 43 min   Charges:   PT Evaluation $PT Eval Moderate Complexity: 1 Mod PT Treatments $Gait Training: 8-22 mins $Therapeutic Exercise: 8-22 mins       Ramond Dial 04/15/2019, 5:12 PM   Mee Hives, PT MS Acute Rehab Dept. Number: [place]

## 2019-04-16 LAB — CBC
HCT: 24.6 % — ABNORMAL LOW (ref 36.0–46.0)
Hemoglobin: 7.8 g/dL — ABNORMAL LOW (ref 12.0–15.0)
MCH: 27.7 pg (ref 26.0–34.0)
MCHC: 31.7 g/dL (ref 30.0–36.0)
MCV: 87.2 fL (ref 80.0–100.0)
Platelets: 312 10*3/uL (ref 150–400)
RBC: 2.82 MIL/uL — ABNORMAL LOW (ref 3.87–5.11)
RDW: 13.6 % (ref 11.5–15.5)
WBC: 7.6 10*3/uL (ref 4.0–10.5)
nRBC: 0 % (ref 0.0–0.2)

## 2019-04-16 LAB — RENAL FUNCTION PANEL
Albumin: 2.2 g/dL — ABNORMAL LOW (ref 3.5–5.0)
Anion gap: 13 (ref 5–15)
BUN: 45 mg/dL — ABNORMAL HIGH (ref 8–23)
CO2: 27 mmol/L (ref 22–32)
Calcium: 7.1 mg/dL — ABNORMAL LOW (ref 8.9–10.3)
Chloride: 92 mmol/L — ABNORMAL LOW (ref 98–111)
Creatinine, Ser: 4.82 mg/dL — ABNORMAL HIGH (ref 0.44–1.00)
GFR calc Af Amer: 10 mL/min — ABNORMAL LOW (ref >60)
GFR calc non Af Amer: 9 mL/min — ABNORMAL LOW (ref >60)
Glucose, Bld: 121 mg/dL — ABNORMAL HIGH (ref 70–99)
Phosphorus: 7.3 mg/dL — ABNORMAL HIGH (ref 2.5–4.6)
Potassium: 4.7 mmol/L (ref 3.5–5.1)
Sodium: 132 mmol/L — ABNORMAL LOW (ref 135–145)

## 2019-04-16 LAB — GLUCOSE, CAPILLARY
Glucose-Capillary: 111 mg/dL — ABNORMAL HIGH (ref 70–99)
Glucose-Capillary: 122 mg/dL — ABNORMAL HIGH (ref 70–99)
Glucose-Capillary: 161 mg/dL — ABNORMAL HIGH (ref 70–99)
Glucose-Capillary: 208 mg/dL — ABNORMAL HIGH (ref 70–99)
Glucose-Capillary: 82 mg/dL (ref 70–99)

## 2019-04-16 MED ORDER — PENTAFLUOROPROP-TETRAFLUOROETH EX AERO: 1.0000 "application " | INHALATION_SPRAY | CUTANEOUS | Status: DC | PRN

## 2019-04-16 MED ORDER — HEPARIN SODIUM (PORCINE) 1000 UNIT/ML DIALYSIS: 1000.0000 [IU] | INTRAMUSCULAR | Status: DC | PRN

## 2019-04-16 MED ORDER — HEPARIN SODIUM (PORCINE) 1000 UNIT/ML DIALYSIS: 20.0000 [IU]/kg | INTRAMUSCULAR | Status: DC | PRN

## 2019-04-16 MED ORDER — DARBEPOETIN ALFA 150 MCG/0.3ML IJ SOSY
150.0000 ug | PREFILLED_SYRINGE | INTRAMUSCULAR | Status: DC
  Filled 2019-04-16: qty 0.3

## 2019-04-16 MED ORDER — LIDOCAINE-PRILOCAINE 2.5-2.5 % EX CREA: 1.0000 "application " | TOPICAL_CREAM | CUTANEOUS | Status: DC | PRN

## 2019-04-16 MED ORDER — SODIUM CHLORIDE 0.9 % IV SOLN: 100.0000 mL | INTRAVENOUS | Status: DC | PRN

## 2019-04-16 MED ORDER — HEPARIN SODIUM (PORCINE) 1000 UNIT/ML IJ SOLN
INTRAMUSCULAR | Status: AC
  Filled 2019-04-16: qty 4

## 2019-04-16 MED ORDER — LIDOCAINE HCL (PF) 1 % IJ SOLN: 5.0000 mL | INTRAMUSCULAR | Status: DC | PRN

## 2019-04-16 MED ORDER — ALTEPLASE 2 MG IJ SOLR: 2.0000 mg | Freq: Once | INTRAMUSCULAR | Status: DC | PRN

## 2019-04-16 NOTE — TOC Progression Note (Signed)
Transition of Care [facility]) - Progression Note    Patient Details  Name: Amber Dixon MRN: [id number] Date of Birth: Jul 24, 1950  Transition of Care [facility]) CM/SW Contact  Bartholomew Crews, RN Phone Number: [phone] 04/16/2019, 4:18 PM  Clinical Narrative:    Received call from renal navigator to discuss payment arrangements ($450/month which works out to $37/treatment) and transportation with patient. Spoke with patient at the bedside using video translator for [place] interpretation. Patient thought she would not be able to afford payment, however, agreed for NCM to reach out to any of her family. Spoke with patient's son, Gwyneth Revels, through [place] interpreter. Federico agreed to payment arrangements and stated that he would provide transportation to hemodialysis for his mom. Renal navigator advised of discussion. TOC team following for transition needs.    Expected Discharge Plan: Home/Self Care Barriers to Discharge: Continued Medical Work up(pt here on [place], undocumented, new HD...? d/c back to [place] to be under their care when medically able)  Expected Discharge Plan and Services Expected Discharge Plan: Home/Self Care                                               Social Determinants of Health (SDOH) Interventions    Readmission Risk Interventions No flowsheet data found.

## 2019-04-16 NOTE — Discharge Summary (Signed)
Referral has been made to [facility] Case Manager to discuss patient's ability to pay for dialysis treatments and arrange transportation to the clinic.

## 2019-04-16 NOTE — Procedures (Signed)
Patient was seen on dialysis and the procedure was supervised.  BFR 400  Via TDC BP is  138/65.   Patient appears to be tolerating treatment well  Louis Meckel 04/16/2019

## 2019-04-16 NOTE — Progress Notes (Signed)
[facility] NEPHROLOGY PROGRESS NOTE  Assessment/ Plan: Pt is Amber 68 y.o. yo female with DM, HTN, presented with chest pain or shortness of breath, progressive CKD to now ESRD.  #Progressive CKD to ESRD: Started HD for severe azotemia and fluid overload.  Refractory to medical management.  Receiving HD as MWF schedule. - getting today  Status post right IJ TDC placed by IR on 12/20.  Would be good to make perm access plans Discharge issue is complicated because of her status in the [place].  Social worker and Tourist information centre manager involved.  Thought to send her to [place] but that doesn't appear to be the desired plan-  Need to CLIP -  Will order vein map-  Would be nice to do AVF prior to discharge as well  #Acute respiratory failure with hypoxia/pulmonary edema: UF during HD.  Improved  #Anemia of CKD: Iron saturation 28%.  Continue Aranesp during HD- inc dose.    #CKD-MBD: Phosphorus level improved.  Continue calcitriol.  PTH 269, at goal for ESRD.  Encourage oral intake  #Hyponatremia: Hypervolemic.  Monitor in HD.  #Hypertension: BP now low at times but also euvolemic-  No BP meds, may need midodrine   Subjective: Seen on dialysis -   No c/o's-  Says did not feel poorly prior to HD being initiated - spoke to her with interpreter   Objective Vital signs in last 24 hours: Vitals:   04/16/19 0800 04/16/19 0815 04/16/19 0830 04/16/19 0900  BP: (!) 95/50 (!) 86/44 (!) 110/58 124/60  Pulse: 87 94 87 85  Resp: (!) 21 (!) 24 20 19   Temp:      TempSrc:      SpO2:      Weight:      Height:       Weight change:  No intake or output data in the 24 hours ending 04/16/19 0946     Labs: Basic Metabolic Panel: Recent Labs  Lab 04/11/19 0724 04/12/19 0225 04/13/19 1215 04/13/19 2042 04/14/19 0236  NA 127* 127* 127* 126* 126*  K 4.7 4.6 5.2* 5.4* 5.3*  CL 90* 88* 89* 89* 89*  CO2 23 28 26 25 24   GLUCOSE 238* 248* 117* 158* 107*  BUN 46* 21 47* 55* 58*  CREATININE 4.64* 2.54*  4.79* 5.46* 5.82*  CALCIUM 7.8* 7.4* 7.7* 7.6* 7.7*  PHOS 2.5 1.6*  --   --  4.5   Liver Function Tests: Recent Labs  Lab 04/11/19 0724 04/12/19 0225 04/14/19 0236  ALBUMIN 2.5* 2.3* 2.3*   No results for input(s): LIPASE, AMYLASE in the last 168 hours. No results for input(s): AMMONIA in the last 168 hours. CBC: Recent Labs  Lab 04/12/19 0657 04/13/19 0704 04/14/19 0236 04/15/19 0505 04/16/19 0847  WBC 13.6* 13.4* 11.2* 7.7 7.6  HGB 9.4* 9.2* 8.5* 8.2* 7.8*  HCT 28.8* 28.3* 26.1* 25.3* 24.6*  MCV 85.7 86.0 85.9 86.1 87.2  PLT 263 312 334 280 312   Cardiac Enzymes: No results for input(s): CKTOTAL, CKMB, CKMBINDEX, TROPONINI in the last 168 hours. CBG: Recent Labs  Lab 04/15/19 1151 04/15/19 1640 04/15/19 1919 04/15/19 2317 04/16/19 0314  GLUCAP 251* 170* 176* 217* 82    Iron Studies: No results for input(s): IRON, TIBC, TRANSFERRIN, FERRITIN in the last 72 hours. Studies/Results: No results found.  Medications: Infusions: . sodium chloride    . sodium chloride      Scheduled Medications: . benzonatate  200 mg Oral TID  . calcitRIOL  0.5 mcg  Oral Daily  . calcium carbonate  500 mg of elemental calcium Oral Q supper  . Chlorhexidine Gluconate Cloth  6 each Topical Q0600  . darbepoetin (ARANESP) injection - DIALYSIS  40 mcg Intravenous Q Wed-HD  . famotidine  20 mg Oral Daily  . feeding supplement (ENSURE ENLIVE)  237 mL Oral TID BM  . insulin aspart  0-6 Units Subcutaneous Q4H  . mouth rinse  15 mL Mouth Rinse BID  . multivitamin  1 tablet Oral QHS    have reviewed scheduled and prn medications.  Physical Exam: General: Not in distress, able to lie flat Heart:RRR, s1s2 nl Lungs: Clear b/l, no crackle Abdomen:soft, Non-tender, non-distended Extremities:No LE edema Dialysis Access: Right IJ TDC  Amber Dixon Amber Dixon 04/16/2019,9:46 AM  LOS: 16 days

## 2019-04-16 NOTE — Progress Notes (Signed)
Nutrition Follow-up  INTERVENTION:   Continue Ensure Enlive po TID, each supplement provides 350 kcal and 20 grams of protein  Magic cup BID with meals, each supplement provides 290 kcal and 9 grams of protein  Continue Rena-Vit  NUTRITION DIAGNOSIS:   Inadequate oral intake related to poor appetite, acute illness as evidenced by meal completion < 25%.  Ongoing.  GOAL:   Patient will meet greater than or equal to 90% of their needs  Progressing.  MONITOR:   PO intake, Supplement acceptance, Labs, Weight trends  ASSESSMENT:   68 yo female admitted with progressive CKD to ESRD requiring intiation of HD, acute respiratory failure with pulmonary edema. PMH includes DM, CKD, HTN  Patient now on renal diet as of 12/20. Placed renal diet information in spanish in discharge instructions.  Patient currently consuming 10% of meals. If PO doesn't improve, may need to liberalize to regular. Pt is drinking Ensure supplements. Will continue as pt's weight has decreased and pt is not eating that well.   Admission weight: 78 lbs. Current weight: 71 lbs.  I/Os: -4.7L since 12/9 HD today: 469 ml UF  Medications: OSCAL tablet, Rena-vit tablet, Calcitriol Labs reviewed: CBGs: 82-111 Low Na Phos 7.3  Diet Order:   Diet Order            Diet renal with fluid restriction Fluid restriction: 1200 mL Fluid; Room service appropriate? Yes; Fluid consistency: Thin  Diet effective now              EDUCATION NEEDS:   Not appropriate for education at this time  Skin:  Skin Assessment: Reviewed RN Assessment  Last BM:  12/22 -type 7  Height:   Ht Readings from Last 1 Encounters:  04/09/19 4\' 10"  (1.473 m)    Weight:   Wt Readings from Last 1 Encounters:  04/16/19 32.6 kg    Ideal Body Weight:   44 kg  BMI:  Body mass index is 15.02 kg/m.  Estimated Nutritional Needs:   Kcal:  1200-1400 kcals  Protein:  60-70 g  Fluid:  1000 mL plus UOP   Clayton Bibles, MS,  RD, LDN Inpatient Clinical Dietitian Pager: [phone] After Hours Pager: [phone]

## 2019-04-16 NOTE — Progress Notes (Signed)
Subjective:  [name] was seen and evaluated at bedside this morning. She was initially asleep, but easily awoken to verbal stimuli. She states that she is doing well, with no complaints at this time. She denies headaches, chest pain, or abdominal pain. We discussed that she needs to be on a stricter diet now that she has ESRD, and that family cannot bring in outside food. She voiced understanding.   Objective:  Vital signs in last 24 hours: Vitals:   04/15/19 1640 04/15/19 1900 04/15/19 2317 04/16/19 0314  BP: 137/70 (!) 152/69 134/65 (!) 147/70  Pulse: 85 91 89 86  Resp: 17 20    Temp: 98.7 F (37.1 C) 99.1 F (37.3 C) 98.7 F (37.1 C) 98.4 F (36.9 C)  TempSrc: Oral Oral Oral Oral  SpO2: 99%  100% 99%  Weight:      Height:       Physical Exam Vitals and nursing note reviewed.  Constitutional:      General: She is not in acute distress.    Appearance: Normal appearance. She is not ill-appearing or toxic-appearing.  HENT:     Head: Normocephalic and atraumatic.  Eyes:     General:        Right eye: No discharge.        Left eye: No discharge.     Conjunctiva/sclera: Conjunctivae normal.  Cardiovascular:     Rate and Rhythm: Normal rate and regular rhythm.     Pulses: Normal pulses.     Heart sounds: Normal heart sounds. No murmur. No friction rub. No gallop.   Pulmonary:     Effort: Pulmonary effort is normal.     Breath sounds: Normal breath sounds. No wheezing, rhonchi or rales.  Abdominal:     General: Bowel sounds are normal.     Palpations: Abdomen is soft.     Tenderness: There is no abdominal tenderness. There is no guarding.  Musculoskeletal:     Right lower leg: No edema.     Left lower leg: No edema.  Neurological:     General: No focal deficit present.     Mental Status: She is alert.  Psychiatric:        Mood and Affect: Mood normal.        Behavior: Behavior normal.     Assessment/Plan:  Principal Problem:   ESRD (end stage renal disease)  on dialysis [facility]) Active Problems:   Acute respiratory failure with hypoxemia ([place])  Amber Dixon is a 68 y.o. with pertinent PMH of CKD stage V, HTN, DM admit for worsening renal failure and pneumonia.   Her case is complicated by lack of insurance and not qualifying for aid as she is in the [place] on a visa. Original plan was to have patient go back to [place], but family refuses to send her back due to poor healthcare system. Currently speaking social work, case management, and the ethics committee was consulted during the course of her case. Family wants full care vs palliative and home hospice. Family speaking with Bishop Dublin at [facility] for financial aid. We reached out to Ms. Hazeline Junker and have left voicemails.   ESRD with Pulmonary Edema S/p tunneled cath placement on 04/13/2019. Plan for dialysis today. Euvolemic on examination. Ethics committee consulted 04/14/2019. - Audiological scientist support - HD per nephro - Trend electrolytes  Diabetes Mellitus AM cbg 82 - Continue to monitor glucose - Continue SSI  Diet: Renal IVF: none VTE: Heparin  Code:  Full  Dispo: Anticipated discharge pending a more complete and safe plan.   Maudie Mercury, MD 04/16/2019, 5:43 AM Pager: [phone]

## 2019-04-16 NOTE — Progress Notes (Signed)
PT Cancellation Note  Patient Details Name: Amber Dixon MRN: [id number] DOB: Apr 16, 1951   Cancelled Treatment:    Reason Eval/Treat Not Completed: Patient at procedure or test/unavailable;Fatigue/lethargy limiting ability to participate.  In HD then very lethargic afterward.  Retry at another time.   Ramond Dial 04/16/2019, 2:55 PM   Mee Hives, PT MS Acute Rehab Dept. Number: [place] and [place]

## 2019-04-17 ENCOUNTER — Inpatient Hospital Stay (HOSPITAL_COMMUNITY): Payer: Self-pay

## 2019-04-17 DIAGNOSIS — N186 End stage renal disease: Secondary | ICD-10-CM

## 2019-04-17 LAB — BASIC METABOLIC PANEL
Anion gap: 9 (ref 5–15)
BUN: 21 mg/dL (ref 8–23)
CO2: 27 mmol/L (ref 22–32)
Calcium: 7.6 mg/dL — ABNORMAL LOW (ref 8.9–10.3)
Chloride: 95 mmol/L — ABNORMAL LOW (ref 98–111)
Creatinine, Ser: 2.45 mg/dL — ABNORMAL HIGH (ref 0.44–1.00)
GFR calc Af Amer: 22 mL/min — ABNORMAL LOW (ref >60)
GFR calc non Af Amer: 19 mL/min — ABNORMAL LOW (ref >60)
Glucose, Bld: 104 mg/dL — ABNORMAL HIGH (ref 70–99)
Potassium: 4.4 mmol/L (ref 3.5–5.1)
Sodium: 131 mmol/L — ABNORMAL LOW (ref 135–145)

## 2019-04-17 LAB — CBC
HCT: 24.6 % — ABNORMAL LOW (ref 36.0–46.0)
Hemoglobin: 7.8 g/dL — ABNORMAL LOW (ref 12.0–15.0)
MCH: 27.2 pg (ref 26.0–34.0)
MCHC: 31.7 g/dL (ref 30.0–36.0)
MCV: 85.7 fL (ref 80.0–100.0)
Platelets: 259 10*3/uL (ref 150–400)
RBC: 2.87 MIL/uL — ABNORMAL LOW (ref 3.87–5.11)
RDW: 13.4 % (ref 11.5–15.5)
WBC: 5.9 10*3/uL (ref 4.0–10.5)
nRBC: 0 % (ref 0.0–0.2)

## 2019-04-17 LAB — GLUCOSE, CAPILLARY
Glucose-Capillary: 107 mg/dL — ABNORMAL HIGH (ref 70–99)
Glucose-Capillary: 134 mg/dL — ABNORMAL HIGH (ref 70–99)
Glucose-Capillary: 153 mg/dL — ABNORMAL HIGH (ref 70–99)
Glucose-Capillary: 176 mg/dL — ABNORMAL HIGH (ref 70–99)
Glucose-Capillary: 230 mg/dL — ABNORMAL HIGH (ref 70–99)
Glucose-Capillary: 89 mg/dL (ref 70–99)

## 2019-04-17 MED ORDER — CHLORHEXIDINE GLUCONATE CLOTH 2 % EX PADS: 6.0000 | MEDICATED_PAD | Freq: Every day | CUTANEOUS | Status: DC

## 2019-04-17 MED ORDER — DARBEPOETIN ALFA 150 MCG/0.3ML IJ SOSY
150.0000 ug | PREFILLED_SYRINGE | INTRAMUSCULAR | Status: DC
  Administered 2019-04-21: 150 ug via INTRAVENOUS
  Filled 2019-04-17: qty 0.3

## 2019-04-17 NOTE — Progress Notes (Signed)
UE vein mapping       has been completed. Preliminary results can be found under CV proc through chart review. Roselee Tayloe, BS, RDMS, RVT   

## 2019-04-17 NOTE — Progress Notes (Signed)
Amber Dixon  Assessment/ Plan: Pt is a 68 y.o. yo female with DM, HTN, presented with chest pain or shortness of breath, progressive CKD to now ESRD.  #Progressive CKD to ESRD: Started HD for severe azotemia and fluid overload refractory to medical management.  Receiving HD as MWF schedule. - next will be on Sat (holiday sched)   Status post right IJ TDC placed by IR on 12/20.  Would be good to make perm access plans- ordered vein map Discharge issue is complicated because of her status in the [place].  Social worker and Tourist information centre manager involved.  Thought to send her to [place] but that doesn't appear to be the desired plan-  Need to CLIP -   Would be nice to do AVF prior to discharge as well- will consult VVS on Mon.  Plans not firm yet for OP HD-  Family has agreed in theory to payments for HD but nothing firm yet  #Acute respiratory failure with hypoxia/pulmonary edema: UF during HD.  Improved  #Anemia of CKD: Iron saturation 28%.  Continue Aranesp during HD- inc dose.    #CKD-MBD: Phosphorus level improved.  Continue calcitriol.  PTH 269, at goal for ESRD.  Encourage oral intake  #Hyponatremia: Hypervolemic.  Monitor in HD.  #Hypertension: BP highly variable s but also euvolemic-  No BP meds- cont to watch for need    Pt will not be physically seen tomorrow-  Will re establish daily visits on 12/26-  Next HD planned for 12/26 on holiday schedule.  Please call [phone] for issues on 12/25  Subjective: HD yest-  Only removed 469 mls as BP was low at that time.  Now BP is high ?   Does not seem volume overloaded on exam. I see that plans are being attempted for HD if family can afford $450 per month and can transport to dialysis.  I guess they agree in theory   Objective Vital signs in last 24 hours: Vitals:   04/16/19 1956 04/16/19 2350 04/17/19 0402 04/17/19 0733  BP: (!) 151/58 (!) 171/69 (!) 152/60 (!) 169/69  Pulse: 91 81 80 84  Resp: 20 18 16  12   Temp: 98.9 F (37.2 C) 98.7 F (37.1 C) 98.7 F (37.1 C) 98.8 F (37.1 C)  TempSrc: Oral Oral Oral Oral  SpO2: 100% 99% 100% 100%  Weight:      Height:       Weight change:   Intake/Output Summary (Last 24 hours) at 04/17/2019 0800 Last data filed at 04/16/2019 1027 Gross per 24 hour  Intake --  Output 469 ml  Net -469 ml       Labs: Basic Metabolic Panel: Recent Labs  Lab 04/12/19 0225 04/14/19 0236 04/16/19 0847 04/17/19 0250  NA 127* 126* 132* 131*  K 4.6 5.3* 4.7 4.4  CL 88* 89* 92* 95*  CO2 28 24 27 27   GLUCOSE 248* 107* 121* 104*  BUN 21 58* 45* 21  CREATININE 2.54* 5.82* 4.82* 2.45*  CALCIUM 7.4* 7.7* 7.1* 7.6*  PHOS 1.6* 4.5 7.3*  --    Liver Function Tests: Recent Labs  Lab 04/12/19 0225 04/14/19 0236 04/16/19 0847  ALBUMIN 2.3* 2.3* 2.2*   No results for input(s): LIPASE, AMYLASE in the last 168 hours. No results for input(s): AMMONIA in the last 168 hours. CBC: Recent Labs  Lab 04/13/19 0704 04/14/19 0236 04/15/19 0505 04/16/19 0847 04/17/19 0250  WBC 13.4* 11.2* 7.7 7.6 5.9  HGB 9.2* 8.5*  8.2* 7.8* 7.8*  HCT 28.3* 26.1* 25.3* 24.6* 24.6*  MCV 86.0 85.9 86.1 87.2 85.7  PLT 312 334 280 312 259   Cardiac Enzymes: No results for input(s): CKTOTAL, CKMB, CKMBINDEX, TROPONINI in the last 168 hours. CBG: Recent Labs  Lab 04/16/19 1556 04/16/19 1947 04/16/19 2349 04/17/19 0401 04/17/19 0731  GLUCAP 208* 122* 161* 89 107*    Iron Studies: No results for input(s): IRON, TIBC, TRANSFERRIN, FERRITIN in the last 72 hours. Studies/Results: No results found.  Medications: Infusions:   Scheduled Medications: . benzonatate  200 mg Oral TID  . calcitRIOL  0.5 mcg Oral Daily  . calcium carbonate  500 mg of elemental calcium Oral Q supper  . Chlorhexidine Gluconate Cloth  6 each Topical Q0600  . darbepoetin (ARANESP) injection - DIALYSIS  150 mcg Intravenous Q Wed-HD  . famotidine  20 mg Oral Daily  . feeding supplement (ENSURE  ENLIVE)  237 mL Oral TID BM  . insulin aspart  0-6 Units Subcutaneous Q4H  . mouth rinse  15 mL Mouth Rinse BID  . multivitamin  1 tablet Oral QHS    have reviewed scheduled and prn medications.  Physical Exam: General: Not in distress, able to lie flat Heart:RRR, s1s2 nl Lungs: Clear b/l, no crackle Abdomen:soft, Non-tender, non-distended Extremities:No LE edema Dialysis Access: Right IJ TDC  Amber Dixon 04/17/2019,8:00 AM  LOS: 17 days

## 2019-04-17 NOTE — Progress Notes (Signed)
Subjective:  [facility] referral made, patient for 450 monthly payments. Examined and evaluated at bedside this AM. Amber Dixon states she has some pain at her catheter site but otherwise has no complaints. Denies any dyspnea, nausea, vomiting. All questions and concerns were addressed.   Objective:  Vital signs in last 24 hours: Vitals:   04/16/19 1555 04/16/19 1956 04/16/19 2350 04/17/19 0402  BP: 132/67 (!) 151/58 (!) 171/69 (!) 152/60  Pulse: 88 91 81 80  Resp: 17 20 18 16   Temp: 98.8 F (37.1 C) 98.9 F (37.2 C) 98.7 F (37.1 C) 98.7 F (37.1 C)  TempSrc: Oral Oral Oral Oral  SpO2: 100% 100% 99% 100%  Weight:      Height:       BMP Latest Ref Rng & Units 04/17/2019 04/16/2019 04/14/2019  Glucose 70 - 99 mg/dL 104(H) 121(H) 107(H)  BUN 8 - 23 mg/dL 21 45(H) 58(H)  Creatinine 0.44 - 1.00 mg/dL 2.45(H) 4.82(H) 5.82(H)  Sodium 135 - 145 mmol/L 131(L) 132(L) 126(L)  Potassium 3.5 - 5.1 mmol/L 4.4 4.7 5.3(H)  Chloride 98 - 111 mmol/L 95(L) 92(L) 89(L)  CO2 22 - 32 mmol/L 27 27 24   Calcium 8.9 - 10.3 mg/dL 7.6(L) 7.1(L) 7.7(L)   CBC Latest Ref Rng & Units 04/17/2019 04/16/2019 04/15/2019  WBC 4.0 - 10.5 K/uL 5.9 7.6 7.7  Hemoglobin 12.0 - 15.0 g/dL 7.8(L) 7.8(L) 8.2(L)  Hematocrit 36.0 - 46.0 % 24.6(L) 24.6(L) 25.3(L)  Platelets 150 - 400 K/uL 259 312 280   Physical Exam Constitutional:      General: She is not in acute distress.    Appearance: She is not ill-appearing or toxic-appearing.  HENT:     Head: Normocephalic and atraumatic.  Cardiovascular:     Rate and Rhythm: Normal rate and regular rhythm.     Heart sounds: Normal heart sounds. No murmur. No friction rub. No gallop.   Pulmonary:     Effort: Pulmonary effort is normal.     Breath sounds: Normal breath sounds. No wheezing, rhonchi or rales.  Abdominal:     General: Bowel sounds are normal.     Palpations: Abdomen is soft.     Tenderness: There is no abdominal tenderness. There is no  guarding.  Neurological:     Mental Status: She is alert and oriented to person, place, and time.  Psychiatric:        Mood and Affect: Mood normal.        Behavior: Behavior normal.    Assessment/Plan:  Principal Problem:   ESRD (end stage renal disease) on dialysis [facility]) Active Problems:   Acute respiratory failure with hypoxemia ([place])  Amber Dixon is a 68 y.o. with pertinent PMH of CKD stage V, HTN, DM admit for worsening renal failure and pneumonia.   Patient's case is challenging due to lack of insurance as she is in the [place] on a visa. Originally the plan was to have patient go back to [place] as she would have universal healthcare, but it appears that case management and the renal navigator have found a facility that the son states he can afford the monthly payments, and will provide transportation for his mother. Currently waiting for confirmation of new plan.   ESRD with Pulmonary Edema S/p tunneled cath placement on 04/13/2019. Plan for dialysis today. Euvolemic on examination.  - Vein mapping today for possible AVF.  - HD per nephro - Trend electrolytes  Diabetes Mellitus AM cbg 89 - Continue to  monitor glucose - Continue SSI  Diet: Renal IVF: none VTE: Heparin  Code: Full  Dispo: Anticipated discharge pending a more complete and safe plan.   Maudie Mercury, MD 04/17/2019, 5:51 AM Pager: [phone]

## 2019-04-17 NOTE — Progress Notes (Signed)
Physical Therapy Treatment Patient Details Name: Amber Dixon MRN: [id number] DOB: Oct 14, 1950 Today's Date: 04/17/2019    History of Present Illness 68 yo female with onset of fluid overload, new HD need, PNA, SOB, renal failure and low Na+ was admitted and requires Spanish translation.  Covid (-).  Has PMHx:  R hip fracture with ORIF in [place], ESRD not with HD, HTN, DM,     PT Comments    Pt was seen with assistance of translation, and was able to walk with min guard and cues for directing around obstacles.  Pt is motivated to strengthen RLE and did all ex's with minor cues and manual resistance.  Pt had the opportunity to ask questions and reports no increased pain from therapy today.  Continue acute therapy as ordered.   Follow Up Recommendations  Home health PT;Supervision for mobility/OOB;Supervision/Assistance - 24 hour     Equipment Recommendations  None recommended by PT    Recommendations for Other Services       Precautions / Restrictions Precautions Precautions: Fall Precaution Comments: RLE weak Restrictions Weight Bearing Restrictions: No    Mobility  Bed Mobility Overal bed mobility: Needs Assistance Bed Mobility: Supine to Sit;Sit to Supine     Supine to sit: Min assist Sit to supine: Min assist   General bed mobility comments: assisted to scoot to side of bed  Transfers Overall transfer level: Needs assistance Equipment used: Rolling walker (2 wheeled);1 person hand held assist Transfers: Sit to/from Stand Sit to Stand: Min guard         General transfer comment: min guard for safety  Ambulation/Gait Ambulation/Gait assistance: Min guard Gait Distance (Feet): 100 Feet Assistive device: Rolling walker (2 wheeled);1 person hand held assist;IV Pole Gait Pattern/deviations: Step-through pattern;Decreased stride length;Narrow base of support;Trunk flexed Gait velocity: reduced, controlled Gait velocity interpretation: <1.31 ft/sec,  indicative of household ambulator General Gait Details: pt was paced on the hall to avoid desaturation    Stairs             Wheelchair Mobility    Modified Rankin (Stroke Patients Only)       Balance   Sitting-balance support: Feet supported Sitting balance-Leahy Scale: Good       Standing balance-Leahy Scale: Fair Standing balance comment: less than fair dynamically                            Cognition Arousal/Alertness: Awake/alert Behavior During Therapy: WFL for tasks assessed/performed Overall Cognitive Status: Within Functional Limits for tasks assessed                                 General Comments: pt reports she is appreciat       Exercises General Exercises - Lower Extremity Ankle Circles/Pumps: AROM;5 reps Heel Slides: AROM;10 reps Hip ABduction/ADduction: AROM;10 reps Straight Leg Raises: AROM;10 reps Hip Flexion/Marching: AROM;10 reps    General Comments General comments (skin integrity, edema, etc.): Pt was able to walk with minimal help, notably with wider turns but with translator can maneuver with min guard       Pertinent Vitals/Pain Pain Assessment: Faces Faces Pain Scale: Hurts little more Pain Location: R hip Pain Descriptors / Indicators: Grimacing Pain Intervention(s): Limited activity within patient's tolerance;Monitored during session;Repositioned    Home Living  Prior Function            PT Goals (current goals can now be found in the care plan section) Acute Rehab PT Goals Patient Stated Goal: to feel better Progress towards PT goals: Progressing toward goals    Frequency    Min 3X/week      PT Plan Current plan remains appropriate    Co-evaluation              AM-PAC PT "6 Clicks" Mobility   Outcome Measure  Help needed turning from your back to your side while in a flat bed without using bedrails?: None Help needed moving from lying on your  back to sitting on the side of a flat bed without using bedrails?: None Help needed moving to and from a bed to a chair (including a wheelchair)?: A Little Help needed standing up from a chair using your arms (e.g., wheelchair or bedside chair)?: A Little Help needed to walk in hospital room?: A Little Help needed climbing 3-5 steps with a railing? : A Lot 6 Click Score: 19    End of Session Equipment Utilized During Treatment: Gait belt Activity Tolerance: Patient tolerated treatment well Patient left: in bed;with call bell/phone within reach;with bed alarm set Nurse Communication: Mobility status PT Visit Diagnosis: Unsteadiness on feet (R26.81);Muscle weakness (generalized) (M62.81);Difficulty in walking, not elsewhere classified (R26.2);Dizziness and giddiness (R42)     Time: [id number] PT Time Calculation (min) (ACUTE ONLY): 35 min  Charges:  $Gait Training: 8-22 mins $Therapeutic Exercise: 8-22 mins                    Ramond Dial 04/17/2019, 8:43 PM   Mee Hives, PT MS Acute Rehab Dept. Number: [place] and [place]

## 2019-04-18 LAB — GLUCOSE, CAPILLARY
Glucose-Capillary: 141 mg/dL — ABNORMAL HIGH (ref 70–99)
Glucose-Capillary: 100 mg/dL — ABNORMAL HIGH (ref 70–99)
Glucose-Capillary: 173 mg/dL — ABNORMAL HIGH (ref 70–99)
Glucose-Capillary: 129 mg/dL — ABNORMAL HIGH (ref 70–99)
Glucose-Capillary: 194 mg/dL — ABNORMAL HIGH (ref 70–99)

## 2019-04-18 LAB — BASIC METABOLIC PANEL
Anion gap: 14 (ref 5–15)
BUN: 35 mg/dL — ABNORMAL HIGH (ref 8–23)
CO2: 21 mmol/L — ABNORMAL LOW (ref 22–32)
Calcium: 7.9 mg/dL — ABNORMAL LOW (ref 8.9–10.3)
Chloride: 96 mmol/L — ABNORMAL LOW (ref 98–111)
Creatinine, Ser: 4.07 mg/dL — ABNORMAL HIGH (ref 0.44–1.00)
GFR calc Af Amer: 12 mL/min — ABNORMAL LOW (ref >60)
GFR calc non Af Amer: 10 mL/min — ABNORMAL LOW (ref >60)
Glucose, Bld: 91 mg/dL (ref 70–99)
Potassium: 4.4 mmol/L (ref 3.5–5.1)
Sodium: 131 mmol/L — ABNORMAL LOW (ref 135–145)

## 2019-04-18 LAB — CBC
HCT: 25.6 % — ABNORMAL LOW (ref 36.0–46.0)
Hemoglobin: 8.2 g/dL — ABNORMAL LOW (ref 12.0–15.0)
MCH: 27.3 pg (ref 26.0–34.0)
MCHC: 32 g/dL (ref 30.0–36.0)
MCV: 85.3 fL (ref 80.0–100.0)
Platelets: 252 10*3/uL (ref 150–400)
RBC: 3 MIL/uL — ABNORMAL LOW (ref 3.87–5.11)
RDW: 13.3 % (ref 11.5–15.5)
WBC: 6.2 10*3/uL (ref 4.0–10.5)
nRBC: 0 % (ref 0.0–0.2)

## 2019-04-18 MED ORDER — POLYETHYLENE GLYCOL 3350 17 G PO PACK: 17.0000 g | PACK | Freq: Every day | ORAL | Status: DC

## 2019-04-18 MED ORDER — POLYETHYLENE GLYCOL 3350 17 G PO PACK: 17.0000 g | PACK | Freq: Every day | ORAL | Status: DC | PRN

## 2019-04-18 NOTE — Progress Notes (Addendum)
Subjective:   [name] was seen and evaluated at bedside this morning. She states that she is doing well. She has not complaints at this time. She denies nausea, vomiting, headaches, chest pain, or diarrhea/constipation. We discussed her dialysis session being moved to tomorrow per Nephrology's note from 04/17/2019. She was agreeable to the plan.    Objective:  Vital signs in last 24 hours: Vitals:   04/17/19 1507 04/17/19 2042 04/17/19 2348 04/18/19 0413  BP: (!) 141/77 (!) 160/78 (!) 153/62 (!) 149/63  Pulse: 92 91 87 77  Resp: 13 17 (!) 22 15  Temp: 98.7 F (37.1 C) 98.6 F (37 C) 99.2 F (37.3 C) 98.6 F (37 C)  TempSrc: Oral Oral Oral Oral  SpO2: 98% 100% 100% 99%  Weight:      Height:       CBG (last 3)  Recent Labs    04/17/19 1949 04/17/19 2345 04/18/19 0411  GLUCAP 153* 176* 141*   BMP Latest Ref Rng & Units 04/18/2019 04/17/2019 04/16/2019  Glucose 70 - 99 mg/dL 91 104(H) 121(H)  BUN 8 - 23 mg/dL 35(H) 21 45(H)  Creatinine 0.44 - 1.00 mg/dL 4.07(H) 2.45(H) 4.82(H)  Sodium 135 - 145 mmol/L 131(L) 131(L) 132(L)  Potassium 3.5 - 5.1 mmol/L 4.4 4.4 4.7  Chloride 98 - 111 mmol/L 96(L) 95(L) 92(L)  CO2 22 - 32 mmol/L 21(L) 27 27  Calcium 8.9 - 10.3 mg/dL 7.9(L) 7.6(L) 7.1(L)   CBC Latest Ref Rng & Units 04/18/2019 04/17/2019 04/16/2019  WBC 4.0 - 10.5 K/uL 6.2 5.9 7.6  Hemoglobin 12.0 - 15.0 g/dL 8.2(L) 7.8(L) 7.8(L)  Hematocrit 36.0 - 46.0 % 25.6(L) 24.6(L) 24.6(L)  Platelets 150 - 400 K/uL 252 259 312  Physical Exam Constitutional:      General: She is not in acute distress.    Appearance: She is not ill-appearing or toxic-appearing.  HENT:     Head: Normocephalic and atraumatic.  Cardiovascular:     Rate and Rhythm: Normal rate and regular rhythm.     Heart sounds: Normal heart sounds. No murmur. No friction rub. No gallop.   Pulmonary:     Effort: Pulmonary effort is normal.     Breath sounds: Normal breath sounds. No wheezing, rhonchi or rales.    Abdominal:     General: Bowel sounds are normal.     Palpations: Abdomen is soft.     Tenderness: There is no abdominal tenderness. There is no guarding.  Musculoskeletal:        General: No swelling or tenderness.  Neurological:     Mental Status: She is alert.  Psychiatric:        Mood and Affect: Mood normal.        Behavior: Behavior normal.   UPPER EXTREMITY VEIN MAPPING  Indications: Pre-access.  Performing Technologist: June Leap RDMS, RVT   Examination Guidelines: A complete evaluation includes B-mode imaging, spectral Doppler, color Doppler, and power Doppler as needed of all accessible portions of each vessel. Bilateral testing is considered an integral part of a complete examination. Limited examinations for reoccurring indications may be performed as noted.  +-----------------+-------------+----------+-------------------------+ Right Cephalic   Diameter (cm)Depth (cm)        Findings          +-----------------+-------------+----------+-------------------------+ Shoulder             0.38        0.44                             +-----------------+-------------+----------+-------------------------+  Mid upper arm        0.35        0.18                             +-----------------+-------------+----------+-------------------------+ Dist upper arm       0.37        0.20                             +-----------------+-------------+----------+-------------------------+ Antecubital fossa    0.49                        IV site          +-----------------+-------------+----------+-------------------------+ Prox forearm                            unable to image due to IV +-----------------+-------------+----------+-------------------------+ Mid forearm          0.27        0.34                             +-----------------+-------------+----------+-------------------------+ Dist forearm         0.20                                          +-----------------+-------------+----------+-------------------------+  +-----------------+-------------+----------+--------+ Left Cephalic    Diameter (cm)Depth (cm)Findings +-----------------+-------------+----------+--------+ Shoulder             0.37        0.53            +-----------------+-------------+----------+--------+ Mid upper arm        0.34        0.22            +-----------------+-------------+----------+--------+ Dist upper arm       0.27        0.39            +-----------------+-------------+----------+--------+ Antecubital fossa    0.31        0.38            +-----------------+-------------+----------+--------+ Prox forearm         0.16                        +-----------------+-------------+----------+--------+ Mid forearm          0.17        0.29            +-----------------+-------------+----------+--------+  +-----------------+-------------+----------+--------+ Left Basilic     Diameter (cm)Depth (cm)Findings +-----------------+-------------+----------+--------+ Mid upper arm        0.39        0.68            +-----------------+-------------+----------+--------+ Dist upper arm       0.37        0.63            +-----------------+-------------+----------+--------+ Antecubital fossa    0.34        0.43            +-----------------+-------------+----------+--------+ Prox forearm         0.38        0.31            +-----------------+-------------+----------+--------+  *  See table(s) above for measurements and observations.  Diagnosing physician: Monica Martinez MD Electronically signed by Monica Martinez MD on 04/17/2019 at 5:17:53 PM.    Final   Assessment/Plan:  Principal Problem:   ESRD (end stage renal disease) on dialysis [facility]) Active Problems:   Acute respiratory failure with hypoxemia ([place])  Amber Dixon is a 68 y.o. with pertinent PMH of CKD  stage V, HTN, DM admit for worsening renal failure and pneumonia. New ESRD requiring dialysis.   Patient's case is challenging due to lack of insurance as she is in the [place] on a visa. Originally the plan was to have patient go back to [place] as she would have universal healthcare, but it appears that case management and the renal navigator have found a facility, [facility], for $450/month. Currently waiting for confirmation of new plan.   ESRD with Pulmonary Edema S/p tunneled cath placement on 04/13/2019. MWF dialysis schedule with next dialysis scheduled for 04/19/2019. Euvolemic on examination.  - HD per nephro - Trend electrolytes - Physical Therapy recommends HH PT, Supervision OOB  Diabetes Mellitus AM CBG: 141 - Continue to monitor glucose - Continue SSI  Diet: Renal IVF: none VTE: Heparin  Code: Full  Dispo: Anticipated discharge pending confirmation of new plan.   Maudie Mercury, MD 04/18/2019, 5:57 AM Pager: [phone]

## 2019-04-19 LAB — RENAL FUNCTION PANEL
Albumin: 2.3 g/dL — ABNORMAL LOW (ref 3.5–5.0)
Anion gap: 12 (ref 5–15)
BUN: 67 mg/dL — ABNORMAL HIGH (ref 8–23)
CO2: 23 mmol/L (ref 22–32)
Calcium: 7.7 mg/dL — ABNORMAL LOW (ref 8.9–10.3)
Chloride: 95 mmol/L — ABNORMAL LOW (ref 98–111)
Creatinine, Ser: 5.8 mg/dL — ABNORMAL HIGH (ref 0.44–1.00)
GFR calc Af Amer: 8 mL/min — ABNORMAL LOW (ref >60)
GFR calc non Af Amer: 7 mL/min — ABNORMAL LOW (ref >60)
Glucose, Bld: 235 mg/dL — ABNORMAL HIGH (ref 70–99)
Phosphorus: 4.3 mg/dL (ref 2.5–4.6)
Potassium: 4.9 mmol/L (ref 3.5–5.1)
Sodium: 130 mmol/L — ABNORMAL LOW (ref 135–145)

## 2019-04-19 LAB — CBC
HCT: 23.8 % — ABNORMAL LOW (ref 36.0–46.0)
Hemoglobin: 7.8 g/dL — ABNORMAL LOW (ref 12.0–15.0)
MCH: 27.8 pg (ref 26.0–34.0)
MCHC: 32.8 g/dL (ref 30.0–36.0)
MCV: 84.7 fL (ref 80.0–100.0)
Platelets: 289 10*3/uL (ref 150–400)
RBC: 2.81 MIL/uL — ABNORMAL LOW (ref 3.87–5.11)
RDW: 13.3 % (ref 11.5–15.5)
WBC: 6.7 10*3/uL (ref 4.0–10.5)
nRBC: 0 % (ref 0.0–0.2)

## 2019-04-19 LAB — GLUCOSE, CAPILLARY
Glucose-Capillary: 114 mg/dL — ABNORMAL HIGH (ref 70–99)
Glucose-Capillary: 157 mg/dL — ABNORMAL HIGH (ref 70–99)
Glucose-Capillary: 170 mg/dL — ABNORMAL HIGH (ref 70–99)
Glucose-Capillary: 227 mg/dL — ABNORMAL HIGH (ref 70–99)
Glucose-Capillary: 252 mg/dL — ABNORMAL HIGH (ref 70–99)
Glucose-Capillary: 263 mg/dL — ABNORMAL HIGH (ref 70–99)
Glucose-Capillary: 76 mg/dL (ref 70–99)

## 2019-04-19 MED ORDER — AMLODIPINE BESYLATE 5 MG PO TABS
5.0000 mg | ORAL_TABLET | Freq: Every day | ORAL | Status: DC
  Administered 2019-04-19 – 2019-04-25 (×6): 5 mg via ORAL
  Filled 2019-04-19 (×6): qty 1

## 2019-04-19 MED ORDER — HEPARIN SODIUM (PORCINE) 1000 UNIT/ML IJ SOLN
INTRAMUSCULAR | Status: AC
  Administered 2019-04-19: 3200 [IU]
  Filled 2019-04-19: qty 4

## 2019-04-19 MED ORDER — CALCIUM CARBONATE ANTACID 500 MG PO CHEW
1.0000 | CHEWABLE_TABLET | Freq: Three times a day (TID) | ORAL | Status: DC
  Administered 2019-04-19 – 2019-04-25 (×11): 200 mg via ORAL
  Filled 2019-04-19 (×11): qty 1

## 2019-04-19 MED ORDER — CALCITRIOL 0.25 MCG PO CAPS
0.2500 ug | ORAL_CAPSULE | Freq: Every day | ORAL | Status: DC
  Administered 2019-04-19 – 2019-04-25 (×7): 0.25 ug via ORAL
  Filled 2019-04-19 (×8): qty 1

## 2019-04-19 NOTE — Progress Notes (Addendum)
   Subjective:  Amber Dixon was seen and evaluated at bedside this morning. She states that she is doing well, with no complaints today or overnight. She asked about having her dialysis session today, and we spoke about how nephrology stated that there would be one scheduled for today and that Nephrology would reach out to vascular on Monday about an AVF. She was agreeable to the plan. All questions and concerns were addressed.    Objective:  Vital signs in last 24 hours: Vitals:   04/18/19 1700 04/18/19 1949 04/19/19 0005 04/19/19 0404  BP: (!) 175/78 (!) 162/67 (!) 167/74 (!) 166/71  Pulse: 86 88 81 84  Resp: 16 16 19 17   Temp: 98.7 F (37.1 C) 99.3 F (37.4 C) 98.7 F (37.1 C) 98.7 F (37.1 C)  TempSrc: Oral Oral Oral Oral  SpO2: 100% 100% 98% 98%  Weight:      Height:       CBG (last 3)  Recent Labs    04/19/19 0001 04/19/19 0402 04/19/19 0829  GLUCAP 170* 114* 157*   BMP Latest Ref Rng & Units 04/18/2019 04/17/2019 04/16/2019  Glucose 70 - 99 mg/dL 91 104(H) 121(H)  BUN 8 - 23 mg/dL 35(H) 21 45(H)  Creatinine 0.44 - 1.00 mg/dL 4.07(H) 2.45(H) 4.82(H)  Sodium 135 - 145 mmol/L 131(L) 131(L) 132(L)  Potassium 3.5 - 5.1 mmol/L 4.4 4.4 4.7  Chloride 98 - 111 mmol/L 96(L) 95(L) 92(L)  CO2 22 - 32 mmol/L 21(L) 27 27  Calcium 8.9 - 10.3 mg/dL 7.9(L) 7.6(L) 7.1(L)   CBC Latest Ref Rng & Units 04/19/2019 04/18/2019 04/17/2019  WBC 4.0 - 10.5 K/uL 6.7 6.2 5.9  Hemoglobin 12.0 - 15.0 g/dL 7.8(L) 8.2(L) 7.8(L)  Hematocrit 36.0 - 46.0 % 23.8(L) 25.6(L) 24.6(L)  Platelets 150 - 400 K/uL 289 252 259   Assessment/Plan:  Principal Problem:   ESRD (end stage renal disease) on dialysis [facility]) Active Problems:   Acute respiratory failure with hypoxemia ([place])  Amber Dixon is a 68 y.o. with pertinent PMH of CKD stage V, HTN, DM admit for worsening renal failure and pneumonia. New ESRD requiring dialysis.   Patient's case is challenging due to lack of insurance as  she is in the [place] on a visa. Originally the plan was to have patient go back to [place] as she would have universal healthcare, but it appears that case management and the renal navigator have found a facility, [facility], for $450/month. Currently waiting for confirmation of new plan.   ESRD with Pulmonary Edema S/p tunneled cath placement on 04/13/2019. Scheduled for a HD session today. Nephrology will be reaching out to vascular about a possible AVF before she is discharged.  - HD per nephro - Trend electrolytes  Diabetes Mellitus AM CBG: 157 - Continue to monitor glucose - Continue SSI  Diet: Renal IVF: none VTE: Heparin  Code: Full  Dispo: Anticipated discharge pending confirmation of new plan.   Maudie Mercury, MD 04/19/2019, 9:19 AM Pager: [phone]

## 2019-04-19 NOTE — Progress Notes (Signed)
[facility] NEPHROLOGY PROGRESS NOTE  Assessment/ Plan: Pt is a 68 y.o. yo female with DM, HTN, presented with chest pain or shortness of breath, progressive CKD to now ESRD.  #Progressive CKD to ESRD: Started HD for severe azotemia and fluid overload refractory to medical management.  Receiving HD as MWF schedule. - next will be on today (holiday sched)   Status post right IJ TDC placed by IR on 12/20.  Would be good to make perm access plans- ordered vein map- will consult vvs on Monday Discharge issue is complicated because of her status in the [place].  Social worker and Tourist information centre manager involved.  Thought to send her to [place] but that doesn't appear to be the desired plan-  Need to CLIP and approval from [facility] -   Would be nice to do AVF prior to discharge as well- will consult VVS on Mon.  Plans not firm yet for OP HD-  Family has agreed in theory to payments for HD but nothing firm yet  #Acute respiratory failure with hypoxia/pulmonary edema: UF during HD.  Improved  #Anemia of CKD: Iron saturation 28%.  Continue Aranesp during HD- inc dose.    #CKD-MBD: phos up-  Using tums for binding.  Continue calcitriol.  PTH 269, at goal for ESRD.  Encourage oral intake  #Hyponatremia: Hypervolemic.  Monitor in HD.- uf as able   #Hypertension: BP highly variable s but also euvolemic-  No BP meds- amlodipine was added yesterday    Subjective:  No new issues.  Barriers to discharge are social.   I see that plans are being attempted for HD if family can afford $450 per month and can transport to dialysis.  I guess they agree in theory   Objective Vital signs in last 24 hours: Vitals:   04/18/19 1700 04/18/19 1949 04/19/19 0005 04/19/19 0404  BP: (!) 175/78 (!) 162/67 (!) 167/74 (!) 166/71  Pulse: 86 88 81 84  Resp: 16 16 19 17   Temp: 98.7 F (37.1 C) 99.3 F (37.4 C) 98.7 F (37.1 C) 98.7 F (37.1 C)  TempSrc: Oral Oral Oral Oral  SpO2: 100% 100% 98% 98%  Weight:       Height:       Weight change:  No intake or output data in the 24 hours ending 04/19/19 0758     Labs: Basic Metabolic Panel: Recent Labs  Lab 04/14/19 0236 04/16/19 0847 04/17/19 0250 04/18/19 0200  NA 126* 132* 131* 131*  K 5.3* 4.7 4.4 4.4  CL 89* 92* 95* 96*  CO2 24 27 27  21*  GLUCOSE 107* 121* 104* 91  BUN 58* 45* 21 35*  CREATININE 5.82* 4.82* 2.45* 4.07*  CALCIUM 7.7* 7.1* 7.6* 7.9*  PHOS 4.5 7.3*  --   --    Liver Function Tests: Recent Labs  Lab 04/14/19 0236 04/16/19 0847  ALBUMIN 2.3* 2.2*   No results for input(s): LIPASE, AMYLASE in the last 168 hours. No results for input(s): AMMONIA in the last 168 hours. CBC: Recent Labs  Lab 04/14/19 0236 04/15/19 0505 04/16/19 0847 04/17/19 0250 04/18/19 0200  WBC 11.2* 7.7 7.6 5.9 6.2  HGB 8.5* 8.2* 7.8* 7.8* 8.2*  HCT 26.1* 25.3* 24.6* 24.6* 25.6*  MCV 85.9 86.1 87.2 85.7 85.3  PLT 334 280 312 259 252   Cardiac Enzymes: No results for input(s): CKTOTAL, CKMB, CKMBINDEX, TROPONINI in the last 168 hours. CBG: Recent Labs  Lab 04/18/19 1148 04/18/19 1625 04/18/19 1946 04/19/19 0001 04/19/19 0402  GLUCAP 173* 129* 194* 170* 114*    Iron Studies: No results for input(s): IRON, TIBC, TRANSFERRIN, FERRITIN in the last 72 hours. Studies/Results: VAS [place] UPPER EXT VEIN MAPPING (PRE-OP AVF)  Result Date: 04/17/2019 UPPER EXTREMITY VEIN MAPPING  Indications: Pre-access. Performing Technologist: June Leap RDMS, RVT  Examination Guidelines: A complete evaluation includes B-mode imaging, spectral Doppler, color Doppler, and power Doppler as needed of all accessible portions of each vessel. Bilateral testing is considered an integral part of a complete examination. Limited examinations for reoccurring indications may be performed as noted. +-----------------+-------------+----------+-------------------------+ Right Cephalic   Diameter (cm)Depth (cm)        Findings           +-----------------+-------------+----------+-------------------------+ Shoulder             0.38        0.44                             +-----------------+-------------+----------+-------------------------+ Mid upper arm        0.35        0.18                             +-----------------+-------------+----------+-------------------------+ Dist upper arm       0.37        0.20                             +-----------------+-------------+----------+-------------------------+ Antecubital fossa    0.49                        IV site          +-----------------+-------------+----------+-------------------------+ Prox forearm                            unable to image due to IV +-----------------+-------------+----------+-------------------------+ Mid forearm          0.27        0.34                             +-----------------+-------------+----------+-------------------------+ Dist forearm         0.20                                         +-----------------+-------------+----------+-------------------------+ +-----------------+-------------+----------+--------+ Left Cephalic    Diameter (cm)Depth (cm)Findings +-----------------+-------------+----------+--------+ Shoulder             0.37        0.53            +-----------------+-------------+----------+--------+ Mid upper arm        0.34        0.22            +-----------------+-------------+----------+--------+ Dist upper arm       0.27        0.39            +-----------------+-------------+----------+--------+ Antecubital fossa    0.31        0.38            +-----------------+-------------+----------+--------+ Prox forearm         0.16                        +-----------------+-------------+----------+--------+  Mid forearm          0.17        0.29            +-----------------+-------------+----------+--------+ +-----------------+-------------+----------+--------+ Left Basilic      Diameter (cm)Depth (cm)Findings +-----------------+-------------+----------+--------+ Mid upper arm        0.39        0.68            +-----------------+-------------+----------+--------+ Dist upper arm       0.37        0.63            +-----------------+-------------+----------+--------+ Antecubital fossa    0.34        0.43            +-----------------+-------------+----------+--------+ Prox forearm         0.38        0.31            +-----------------+-------------+----------+--------+ *See table(s) above for measurements and observations.  Diagnosing physician: Monica Martinez MD Electronically signed by Monica Martinez MD on 04/17/2019 at 5:17:53 PM.    Final     Medications: Infusions:   Scheduled Medications: . amLODipine  5 mg Oral Daily  . benzonatate  200 mg Oral TID  . calcitRIOL  0.5 mcg Oral Daily  . calcium carbonate  500 mg of elemental calcium Oral Q supper  . Chlorhexidine Gluconate Cloth  6 each Topical Q0600  . [START ON 04/21/2019] darbepoetin (ARANESP) injection - DIALYSIS  150 mcg Intravenous Q Mon-HD  . famotidine  20 mg Oral Daily  . feeding supplement (ENSURE ENLIVE)  237 mL Oral TID BM  . insulin aspart  0-6 Units Subcutaneous Q4H  . mouth rinse  15 mL Mouth Rinse BID  . multivitamin  1 tablet Oral QHS    have reviewed scheduled and prn medications.  Physical Exam: General: Not in distress, able to lie flat- pleasant-  spanish speaking  Heart:RRR, s1s2 nl Lungs: Clear b/l, no crackle Abdomen:soft, Non-tender, non-distended Extremities:No LE edema Dialysis Access: Right IJ TDC  Amber Dixon 04/19/2019,7:58 AM  LOS: 19 days

## 2019-04-19 NOTE — Procedures (Signed)
Patient was seen on dialysis and the procedure was supervised.  BFR 400  Via TDC BP is  105/54.   Patient appears to be tolerating treatment well  Louis Meckel 04/19/2019

## 2019-04-20 DIAGNOSIS — G44209 Tension-type headache, unspecified, not intractable: Secondary | ICD-10-CM

## 2019-04-20 LAB — GLUCOSE, CAPILLARY
Glucose-Capillary: 101 mg/dL — ABNORMAL HIGH (ref 70–99)
Glucose-Capillary: 97 mg/dL (ref 70–99)
Glucose-Capillary: 216 mg/dL — ABNORMAL HIGH (ref 70–99)
Glucose-Capillary: 129 mg/dL — ABNORMAL HIGH (ref 70–99)

## 2019-04-20 NOTE — Progress Notes (Signed)
Admit: 03/31/2019 LOS: 20  64F new ESRD  Subjective:  . No new issues   12/26 0701 - 12/27 0700 In: 420 [P.O.:420] Out: 1315 [Urine:251; Stool:1]  Filed Weights   04/16/19 1027 04/19/19 1300 04/19/19 1645  Weight: 32.6 kg 33.3 kg 31.9 kg    Scheduled Meds: . amLODipine  5 mg Oral Daily  . benzonatate  200 mg Oral TID  . calcitRIOL  0.25 mcg Oral Daily  . calcium carbonate  1 tablet Oral TID WC  . Chlorhexidine Gluconate Cloth  6 each Topical Q0600  . [START ON 04/21/2019] darbepoetin (ARANESP) injection - DIALYSIS  150 mcg Intravenous Q Mon-HD  . famotidine  20 mg Oral Daily  . feeding supplement (ENSURE ENLIVE)  237 mL Oral TID BM  . insulin aspart  0-6 Units Subcutaneous Q4H  . mouth rinse  15 mL Mouth Rinse BID  . multivitamin  1 tablet Oral QHS   Continuous Infusions: PRN Meds:.acetaminophen, albuterol, dextrose, ondansetron (ZOFRAN) IV, polyethylene glycol, sodium chloride  Current Labs: reviewed    Physical Exam:  Blood pressure (!) 126/57, pulse 90, temperature 98.5 F (36.9 C), temperature source Oral, resp. rate 15, height 4\' 10"  (1.473 m), weight 31.9 kg, SpO2 100 %. NAD, pleasant RRR  CTAB No sig LEE TDC R IJ C/D/I  A 1. New ESRD, on MWF Schedule.  Outpt HD complicated by her citizenship status, see prev notes. Using [facility].  Will consult VVS in AM for AVF/G eval.  HD tomorrow: 2K 3h, 1-2L UF, tight heparin 2. Acute resp failure: improved 3. 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   5. Mild hyponatremia, CTM 6. HTN: stable at current time  P . As above, await SW tomororrow . Medication Issues; o Preferred narcotic agents for pain control are hydromorphone, fentanyl, and methadone. Morphine should not be used.  o Baclofen should be avoided o Avoid oral sodium phosphate and magnesium citrate based laxatives / bowel preps    Pearson Grippe MD 04/20/2019, 3:21 PM  Recent Labs  Lab 04/14/19 0236 04/16/19 0847 04/17/19 0250  04/18/19 0200 04/19/19 1332  NA 126* 132* 131* 131* 130*  K 5.3* 4.7 4.4 4.4 4.9  CL 89* 92* 95* 96* 95*  CO2 24 27 27  21* 23  GLUCOSE 107* 121* 104* 91 235*  BUN 58* 45* 21 35* 67*  CREATININE 5.82* 4.82* 2.45* 4.07* 5.80*  CALCIUM 7.7* 7.1* 7.6* 7.9* 7.7*  PHOS 4.5 7.3*  --   --  4.3   Recent Labs  Lab 04/17/19 0250 04/18/19 0200 04/19/19 0812  WBC 5.9 6.2 6.7  HGB 7.8* 8.2* 7.8*  HCT 24.6* 25.6* 23.8*  MCV 85.7 85.3 84.7  PLT 259 252 289

## 2019-04-20 NOTE — Progress Notes (Signed)
Subjective:  [name] was seen and evaluated at bedside this morning. She states that she is doing well this AM, and tolerated her dialysis session well. She does mention that she does have a headache this morning that is aggravated when she coughs, but is lingering this morning. She states that the headache is bilateral and squeezing. We discussed giving tylenol to alleviate her headache. She was agreeable to the plan.   Objective:  Vital signs in last 24 hours: Vitals:   04/19/19 1800 04/19/19 1946 04/20/19 0010 04/20/19 0354  BP: (!) 132/59 135/82 (!) 160/71 (!) 145/67  Pulse:  98 91 88  Resp:  16 16 20   Temp: 98.1 F (36.7 C) 98.3 F (36.8 C) 98.2 F (36.8 C) 99 F (37.2 C)  TempSrc: Oral Oral Oral Oral  SpO2:  100% 100% 100%  Weight:      Height:       CBG (last 3)  Recent Labs    04/19/19 1942 04/19/19 2352 04/20/19 0352  GLUCAP 227* 263* 101*    Intake/Output Summary (Last 24 hours) at 04/20/2019 0714 Last data filed at 04/19/2019 2300 Gross per 24 hour  Intake 420 ml  Output 1315 ml  Net -895 ml    BMP Latest Ref Rng & Units 04/19/2019 04/18/2019 04/17/2019  Glucose 70 - 99 mg/dL 235(H) 91 104(H)  BUN 8 - 23 mg/dL 67(H) 35(H) 21  Creatinine 0.44 - 1.00 mg/dL 5.80(H) 4.07(H) 2.45(H)  Sodium 135 - 145 mmol/L 130(L) 131(L) 131(L)  Potassium 3.5 - 5.1 mmol/L 4.9 4.4 4.4  Chloride 98 - 111 mmol/L 95(L) 96(L) 95(L)  CO2 22 - 32 mmol/L 23 21(L) 27  Calcium 8.9 - 10.3 mg/dL 7.7(L) 7.9(L) 7.6(L)   CBC Latest Ref Rng & Units 04/19/2019 04/18/2019 04/17/2019  WBC 4.0 - 10.5 K/uL 6.7 6.2 5.9  Hemoglobin 12.0 - 15.0 g/dL 7.8(L) 8.2(L) 7.8(L)  Hematocrit 36.0 - 46.0 % 23.8(L) 25.6(L) 24.6(L)  Platelets 150 - 400 K/uL 289 252 259  Physical Exam Constitutional:      General: She is not in acute distress.    Appearance: She is not ill-appearing or toxic-appearing.     Comments: Patient sitting comfortably in bed. No acute distress.   HENT:     Head:  Normocephalic and atraumatic.  Neck:     Comments: R Hemodialysis Cath in place without erythema or purulence.  Cardiovascular:     Rate and Rhythm: Normal rate and regular rhythm.     Heart sounds: Normal heart sounds. No murmur. No friction rub. No gallop.   Pulmonary:     Effort: Pulmonary effort is normal.     Breath sounds: Normal breath sounds. No wheezing, rhonchi or rales.  Abdominal:     General: Bowel sounds are normal. There is no distension.     Palpations: Abdomen is soft. There is no mass.     Tenderness: There is no abdominal tenderness.  Skin:    General: Skin is warm.     Coloration: Skin is not pale.     Findings: No erythema or rash.  Neurological:     Mental Status: She is alert and oriented to person, place, and time.  Psychiatric:        Mood and Affect: Mood normal.        Behavior: Behavior normal.     Assessment/Plan:  Principal Problem:   ESRD (end stage renal disease) on dialysis [facility]) Active Problems:   Acute respiratory failure with hypoxemia ([facility])  Amber Dixon is a 68 y.o. with pertinent PMH of CKD stage V, HTN, DM admit for worsening renal failure and pneumonia. New ESRD requiring dialysis.   Patient's case is challenging due to lack of insurance as she is in the [place] on a visa. Originally the plan was to have patient go back to [place] as she would have universal healthcare, but it appears that case management and the renal navigator have found a facility, [facility], for $450/month. Currently waiting for confirmation of new plan.   Nephrology to reach out to vascular for possible AVF placement on 04/21/2019.   ESRD with Pulmonary Edema: S/p tunneled cath placement on 04/13/2019. Scheduled for a HD session today. Nephrology will be reaching out to vascular on 04/21/2019 about a possible AVF before she is discharged.  - HD per nephro - Trend electrolytes  Diabetes Mellitus: AM CBG: 101 - Continue to monitor  glucose - Continue SSI  Tension Headache:  Patient with complaints of headaches this moring that are exacerbated with cough. Described as being bilateral and tight. No associated visual changes or auras.  - Tylenol 650 mg PRN administered.   Diet: Renal IVF: none VTE: Heparin  Code: Full  Dispo: Anticipated discharge pending confirmation of new plan.   Maudie Mercury, MD IMTS, PGY-1 Pager: [phone] 04/20/2019,7:15 AM

## 2019-04-21 ENCOUNTER — Inpatient Hospital Stay (HOSPITAL_COMMUNITY): Payer: Self-pay | Admitting: Anesthesiology

## 2019-04-21 LAB — GLUCOSE, CAPILLARY
Glucose-Capillary: 144 mg/dL — ABNORMAL HIGH (ref 70–99)
Glucose-Capillary: 164 mg/dL — ABNORMAL HIGH (ref 70–99)
Glucose-Capillary: 199 mg/dL — ABNORMAL HIGH (ref 70–99)
Glucose-Capillary: 268 mg/dL — ABNORMAL HIGH (ref 70–99)
Glucose-Capillary: 85 mg/dL (ref 70–99)

## 2019-04-21 LAB — CBC
HCT: 25.5 % — ABNORMAL LOW (ref 36.0–46.0)
Hemoglobin: 8.1 g/dL — ABNORMAL LOW (ref 12.0–15.0)
MCH: 27.8 pg (ref 26.0–34.0)
MCHC: 31.8 g/dL (ref 30.0–36.0)
MCV: 87.6 fL (ref 80.0–100.0)
Platelets: 331 10*3/uL (ref 150–400)
RBC: 2.91 MIL/uL — ABNORMAL LOW (ref 3.87–5.11)
RDW: 13.6 % (ref 11.5–15.5)
WBC: 7 10*3/uL (ref 4.0–10.5)
nRBC: 0 % (ref 0.0–0.2)

## 2019-04-21 LAB — RENAL FUNCTION PANEL
Albumin: 2.4 g/dL — ABNORMAL LOW (ref 3.5–5.0)
Anion gap: 11 (ref 5–15)
BUN: 50 mg/dL — ABNORMAL HIGH (ref 8–23)
CO2: 29 mmol/L (ref 22–32)
Calcium: 8.1 mg/dL — ABNORMAL LOW (ref 8.9–10.3)
Chloride: 92 mmol/L — ABNORMAL LOW (ref 98–111)
Creatinine, Ser: 4.19 mg/dL — ABNORMAL HIGH (ref 0.44–1.00)
GFR calc Af Amer: 12 mL/min — ABNORMAL LOW (ref >60)
GFR calc non Af Amer: 10 mL/min — ABNORMAL LOW (ref >60)
Glucose, Bld: 111 mg/dL — ABNORMAL HIGH (ref 70–99)
Phosphorus: 4 mg/dL (ref 2.5–4.6)
Potassium: 5.5 mmol/L — ABNORMAL HIGH (ref 3.5–5.1)
Sodium: 132 mmol/L — ABNORMAL LOW (ref 135–145)

## 2019-04-21 MED ORDER — SODIUM CHLORIDE 0.9 % IV SOLN
1.5000 g | INTRAVENOUS | Status: AC
  Filled 2019-04-21 (×2): qty 1.5

## 2019-04-21 MED ORDER — CALCITRIOL 0.25 MCG PO CAPS
ORAL_CAPSULE | ORAL | Status: AC
  Administered 2019-04-21: 0.25 ug via ORAL
  Filled 2019-04-21: qty 1

## 2019-04-21 MED ORDER — HEPARIN SODIUM (PORCINE) 1000 UNIT/ML IJ SOLN
INTRAMUSCULAR | Status: AC
  Administered 2019-04-21: 3200 [IU] via INTRAVENOUS_CENTRAL
  Filled 2019-04-21: qty 4

## 2019-04-21 MED ORDER — DARBEPOETIN ALFA 150 MCG/0.3ML IJ SOSY
PREFILLED_SYRINGE | INTRAMUSCULAR | Status: AC
  Filled 2019-04-21: qty 0.3

## 2019-04-21 NOTE — Procedures (Signed)
I was present at this dialysis session. I have reviewed the session itself and made appropriate changes.   2K bath. Goal UF 1L. Hb stable.    Will consult VVS today for AVF/G eval.   Filed Weights   04/19/19 1300 04/19/19 1645 04/21/19 0655  Weight: 33.3 kg 31.9 kg 33.2 kg    Recent Labs  Lab 04/21/19 0642  NA 132*  K 5.5*  CL 92*  CO2 29  GLUCOSE 111*  BUN 50*  CREATININE 4.19*  CALCIUM 8.1*  PHOS 4.0    Recent Labs  Lab 04/18/19 0200 04/19/19 0812 04/21/19 0550  WBC 6.2 6.7 7.0  HGB 8.2* 7.8* 8.1*  HCT 25.6* 23.8* 25.5*  MCV 85.3 84.7 87.6  PLT 252 289 331    Scheduled Meds: . amLODipine  5 mg Oral Daily  . benzonatate  200 mg Oral TID  . calcitRIOL  0.25 mcg Oral Daily  . calcium carbonate  1 tablet Oral TID WC  . Chlorhexidine Gluconate Cloth  6 each Topical Q0600  . darbepoetin (ARANESP) injection - DIALYSIS  150 mcg Intravenous Q Mon-HD  . famotidine  20 mg Oral Daily  . feeding supplement (ENSURE ENLIVE)  237 mL Oral TID BM  . insulin aspart  0-6 Units Subcutaneous Q4H  . mouth rinse  15 mL Mouth Rinse BID  . multivitamin  1 tablet Oral QHS   Continuous Infusions: PRN Meds:.acetaminophen, albuterol, dextrose, ondansetron (ZOFRAN) IV, polyethylene glycol, sodium chloride   Pearson Grippe  MD 04/21/2019, 9:27 AM

## 2019-04-21 NOTE — Progress Notes (Signed)
PT Cancellation Note  Patient Details Name: Amber Dixon MRN: [id number] DOB: 10-29-1950   Cancelled Treatment:    Reason Eval/Treat Not Completed: Patient at procedure or test/unavailable. Pt off floor at HD. PT to return as able to progress mobility.  Kittie Plater, PT, DPT Acute Rehabilitation Services Pager #: [phone] Office #: [phone]    Berline Lopes 04/21/2019, 7:47 AM

## 2019-04-21 NOTE — Progress Notes (Addendum)
Renal Navigator sent message to [facility] Admissions to follow up on patient's family's plans for self pay for OP HD.  Per [employer] Admissions Coordinator, she is still awaiting documentation that patient's family has agreed to pay out of pocket for treatments.  Renal Navigator spoke with A. Ware/Director of Operations who sent message to Regional VP, who is the one to approve the out of pocket plan, to request that approval of plan be sent to Admissions Coordinator. Regional VP sent his approval and Navigator will follow up with [facility] regarding seat assignment.   Alphonzo Cruise, [place] Renal Navigator [phone]

## 2019-04-21 NOTE — Consult Note (Addendum)
Hospital Consult    Reason for Consult: Permanent dialysis access Requesting Physician: Dr. Joelyn Oms MRN #:  [id number]  History of Present Illness: This is a 68 y.o. female with past medical history significant for diabetes mellitus, hypertension, and CKD now end-stage renal disease on hemodialysis.  She is being seen in consultation for evaluation for permanent dialysis access.  And over the phone interpreter was used.  She was admitted to the hospital on 03/31/2019 due to renal failure.  Interventional radiology placed a right IJ TDC on 04/13/2019.  She is right arm dominant and would prefer access placement in left arm.  Vein mapping was performed.  She is not taking any blood thinners.  She has no history of pacemaker.  Past Medical History:  Diagnosis Date  . Diabetes mellitus without complication ([place])   . Hypertension     Past Surgical History:  Procedure Laterality Date  . IR FLUORO GUIDE CV LINE RIGHT  04/02/2019  . IR FLUORO GUIDE CV LINE RIGHT  04/13/2019  . IR US GUIDE VASC ACCESS RIGHT  04/02/2019  . LEG SURGERY      No Known Allergies  Prior to Admission medications   Medication Sig Start Date End Date Taking? Authorizing Provider  amLODipine (NORVASC) 5 MG tablet Take 1 tablet (5 mg total) by mouth daily. 03/26/19  Yes Al Decant, MD  calcium carbonate (TUMS) 500 MG chewable tablet Chew 1 tablet (200 mg of elemental calcium total) by mouth 3 (three) times daily. 03/25/19 03/24/20 Yes Al Decant, MD  furosemide (LASIX) 80 MG tablet Take 1 tablet (80 mg total) by mouth 2 (two) times daily. 03/25/19 04/24/19 Yes Al Decant, MD  PRESCRIPTION MEDICATION Inject 6 Units into the skin daily. Medication:Insulin Vial   Yes [provider]  sodium bicarbonate 650 MG tablet Take 1 tablet (650 mg total) by mouth 2 (two) times daily. 03/25/19 04/24/19 Yes Al Decant, MD    Social History   Socioeconomic History  . Marital status: Widowed    Spouse name: Not  on file  . Number of children: Not on file  . Years of education: Not on file  . Highest education level: Not on file  Occupational History  . Not on file  Tobacco Use  . Smoking status: Never Smoker  . Smokeless tobacco: Never Used  Substance and Sexual Activity  . Alcohol use: Not on file  . Drug use: Not on file  . Sexual activity: Not on file  Other Topics Concern  . Not on file  Social History Narrative  . Not on file   Social Determinants of Health   Financial Resource Strain:   . Difficulty of Paying Living Expenses: Not on file  Food Insecurity:   . Worried About Charity fundraiser in the Last Year: Not on file  . Ran Out of Food in the Last Year: Not on file  Transportation Needs:   . Lack of Transportation (Medical): Not on file  . Lack of Transportation (Non-Medical): Not on file  Physical Activity:   . Days of Exercise per Week: Not on file  . Minutes of Exercise per Session: Not on file  Stress:   . Feeling of Stress : Not on file  Social Connections:   . Frequency of Communication with Friends and Family: Not on file  . Frequency of Social Gatherings with Friends and Family: Not on file  . Attends Religious Services: Not on file  . Active Member of Clubs or Organizations: Not  on file  . Attends Archivist Meetings: Not on file  . Marital Status: Not on file  Intimate Partner Violence:   . Fear of Current or Ex-Partner: Not on file  . Emotionally Abused: Not on file  . Physically Abused: Not on file  . Sexually Abused: Not on file     No family history on file.  ROS: Otherwise negative unless mentioned in HPI  Physical Examination  Vitals:   04/21/19 1005 04/21/19 1112  BP: (!) 176/85 (!) 169/74  Pulse: 88 87  Resp: 13 14  Temp: 97.7 F (36.5 C) 98.5 F (36.9 C)  SpO2: 100% 99%   Body mass index is 15.3 kg/m.  General:  WDWN in NAD Gait: Not observed HENT: WNL, normocephalic Pulmonary: normal non-labored breathing, without  Rales, rhonchi,  wheezing Cardiac: regular Abdomen:  soft, NT/ND, no masses Skin: without rashes Vascular Exam/Pulses: Symmetrical radial pulses Extremities: without ischemic changes, without Gangrene , without cellulitis; without open wounds;  Musculoskeletal: no muscle wasting or atrophy  Neurologic: A&O X 3;  No focal weakness or paresthesias are detected; speech is fluent/normal Psychiatric:  The pt has Normal affect. Lymph:  Unremarkable  CBC    Component Value Date/Time   WBC 7.0 04/21/2019 0550   RBC 2.91 (L) 04/21/2019 0550   HGB 8.1 (L) 04/21/2019 0550   HCT 25.5 (L) 04/21/2019 0550   PLT 331 04/21/2019 0550   MCV 87.6 04/21/2019 0550   MCH 27.8 04/21/2019 0550   MCHC 31.8 04/21/2019 0550   RDW 13.6 04/21/2019 0550   LYMPHSABS 0.7 04/07/2019 0508   MONOABS 0.6 04/07/2019 0508   EOSABS 0.8 (H) 04/07/2019 0508   BASOSABS 0.0 04/07/2019 0508    BMET    Component Value Date/Time   NA 132 (L) 04/21/2019 0642   K 5.5 (H) 04/21/2019 0642   CL 92 (L) 04/21/2019 0642   CO2 29 04/21/2019 0642   GLUCOSE 111 (H) 04/21/2019 0642   BUN 50 (H) 04/21/2019 0642   CREATININE 4.19 (H) 04/21/2019 0642   CALCIUM 8.1 (L) 04/21/2019 0642   CALCIUM 6.3 03/22/2019 0300   GFRNONAA 10 (L) 04/21/2019 0642   GFRAA 12 (L) 04/21/2019 0642    COAGS: Lab Results  Component Value Date   INR 1.1 03/21/2019     Non-Invasive Vascular Imaging:   Vein mapping performed; adequate conduits include right cephalic vein, left cephalic vein, left basilic vein   ASSESSMENT/PLAN: This is a 68 y.o. female with end-stage renal disease on hemodialysis being evaluated for permanent dialysis access  On call vascular surgeon Dr. Oneida Alar will evaluate the patient later today and provide further treatment plans.   Dagoberto Ligas PA-C [facility] [phone]   Vein mapping reviewed right upper arm cephalic vein is best quality vein.  Unfortunately it currently has an IV in  it.  Will remove IV from right arm and place restricted band on right arm.  Will try to get her fistula placed tomorrow in OR. However will need to get interpreter tomorrow to make sure pt understands risk benefit complications of procedure.  NPO p midnight Consent for right arm AV fistula  Ruta Hinds, MD [facility] [place] Office: [phone]

## 2019-04-21 NOTE — Progress Notes (Signed)
   Subjective:   Pt seen at the bedside this AM during dialysis treatment.  Patient states that she felt nauseous this morning and had 1 episode of vomiting.  Otherwise she is doing okay.  We discussed how the vascular surgeons will need to place an AV fistula in order for her to continue receiving dialysis. Patient endorses understanding and is in agreement.  No other questions at this time.  Objective:  Vital signs in last 24 hours: Vitals:   04/20/19 1637 04/20/19 1949 04/21/19 0010 04/21/19 0403  BP: (!) 144/76  (!) 123/59 (!) 145/69  Pulse: 85 97 91 83  Resp: 13 14 15 15   Temp: 99.3 F (37.4 C) 98.3 F (36.8 C) 98.8 F (37.1 C) 98.3 F (36.8 C)  TempSrc: Oral Oral Oral Oral  SpO2: 100% 100% 98% 100%  Weight:      Height:       Physical Exam General: Resting in bed comfortably, NAD HEENT: NCAT CV: RRR, normal S1-S2 no murmurs rubs or gallops appreciated PULM: CTAB, no crackles or wheezing appreciated ABD: Soft and nontender in all quadrants NEURO: Alert and oriented, no focal deficits  Assessment/Plan:  Principal Problem:   ESRD (end stage renal disease) on dialysis ([place]) Active Problems:   Acute respiratory failure with hypoxemia (HCC)  In summary, [name] is a 68 year old female with past medical history significant for CKD stage V, HTN, T2DM who was admitted for worsening renal failure, requiring HD.  The patient's case is challenging due to lack of insurance.  She is also in the [place] on a visa.  Originally the plan was to have the patient go back to [place] she would have universal healthcare, but it appears that case management and the renal navigator have found a facility, [facility], for $450 a month.  The new plan is for the patient to stay here and continue dialysis in the outpatient setting.  #ESRD: s/p tunneled catheter placed on 04/13/2019.  Receiving HD today.  Nephrology has reached out to vascular surgery today for possible AVF before  discharge. Vein mapping order has been placed. -Continue HD per nephrology -F/u vascular surgery  #T2DM -SSI  #Nausea -Zofran PRN   #FEN/GI -Diet: Renal -Fluids: None  #DVT prophylaxis - Heparin   #CODE STATUS: FULL  #Dispo: Anticipated discharge pending medical course.  Earlene Plater, MD Internal Medicine, PGY1 Pager: [phone]  04/21/2019,9:52 AM

## 2019-04-22 ENCOUNTER — Encounter (HOSPITAL_COMMUNITY)
Admission: EM | Disposition: A | Discharge status: Home / Self Care | Payer: Self-pay | Source: Home / Self Care | Attending: Internal Medicine

## 2019-04-22 LAB — GLUCOSE, CAPILLARY
Glucose-Capillary: 189 mg/dL — ABNORMAL HIGH (ref 70–99)
Glucose-Capillary: 136 mg/dL — ABNORMAL HIGH (ref 70–99)
Glucose-Capillary: 123 mg/dL — ABNORMAL HIGH (ref 70–99)
Glucose-Capillary: 121 mg/dL — ABNORMAL HIGH (ref 70–99)
Glucose-Capillary: 182 mg/dL — ABNORMAL HIGH (ref 70–99)
Glucose-Capillary: 276 mg/dL — ABNORMAL HIGH (ref 70–99)

## 2019-04-22 LAB — RENAL FUNCTION PANEL
Albumin: 2.4 g/dL — ABNORMAL LOW (ref 3.5–5.0)
Anion gap: 9 (ref 5–15)
BUN: 32 mg/dL — ABNORMAL HIGH (ref 8–23)
CO2: 31 mmol/L (ref 22–32)
Calcium: 8.2 mg/dL — ABNORMAL LOW (ref 8.9–10.3)
Chloride: 91 mmol/L — ABNORMAL LOW (ref 98–111)
Creatinine, Ser: 2.62 mg/dL — ABNORMAL HIGH (ref 0.44–1.00)
GFR calc Af Amer: 21 mL/min — ABNORMAL LOW (ref >60)
GFR calc non Af Amer: 18 mL/min — ABNORMAL LOW (ref >60)
Glucose, Bld: 154 mg/dL — ABNORMAL HIGH (ref 70–99)
Phosphorus: 2.8 mg/dL (ref 2.5–4.6)
Potassium: 5 mmol/L (ref 3.5–5.1)
Sodium: 131 mmol/L — ABNORMAL LOW (ref 135–145)

## 2019-04-22 SURGERY — ARTERIOVENOUS (AV) FISTULA CREATION
Anesthesia: Monitor Anesthesia Care | Laterality: Right

## 2019-04-22 MED ORDER — FENTANYL CITRATE (PF) 250 MCG/5ML IJ SOLN
INTRAMUSCULAR | Status: AC
  Filled 2019-04-22: qty 5

## 2019-04-22 MED ORDER — SODIUM ZIRCONIUM CYCLOSILICATE 10 G PO PACK
10.0000 g | PACK | Freq: Once | ORAL | Status: AC
  Administered 2019-04-22: 10 g via ORAL
  Filled 2019-04-22: qty 1

## 2019-04-22 MED ORDER — MIDAZOLAM HCL 2 MG/2ML IJ SOLN
INTRAMUSCULAR | Status: AC
  Filled 2019-04-22: qty 2

## 2019-04-22 MED ORDER — PROPOFOL 1000 MG/100ML IV EMUL
INTRAVENOUS | Status: AC
  Filled 2019-04-22: qty 100

## 2019-04-22 MED ORDER — SODIUM CHLORIDE 0.9 % IV SOLN: INTRAVENOUS | Status: DC

## 2019-04-22 MED ORDER — OXYCODONE-ACETAMINOPHEN 7.5-325 MG PO TABS: 1.0000 | ORAL_TABLET | Freq: Three times a day (TID) | ORAL | 0 refills | Status: DC | PRN

## 2019-04-22 MED FILL — OXYCODONE-APAP 7.5-325MG: 7.5-325 | 3 days supply | Qty: 9 | Fill #0

## 2019-04-22 NOTE — Progress Notes (Signed)
Pt cancelled due to emergency case.  Will reschedule for Thursday Dec 31  Ruta Hinds, MD [facility] [place] Office: [phone]

## 2019-04-22 NOTE — Discharge Summary (Addendum)
Name: Amber Dixon MRN: [id number] DOB: [birth year] 68 y.o. PCP: Patient, No Pcp Per  Date of Admission: 03/31/2019  8:36 AM Date of Discharge: 04/25/2019 Attending Physician: Lucious Groves, DO  Discharge Diagnosis: 1. ESRD 2. R upper extremity AVF placement  Discharge Medications: Allergies as of 04/25/2019   No Known Allergies     Medication List    TAKE these medications   amLODipine 5 MG tablet Commonly known as: NORVASC Take 1 tablet (5 mg total) by mouth daily. Notes to patient: 04/23/2019 AM   calcium carbonate 500 MG chewable tablet Commonly known as: Tums Chew 1 tablet (200 mg of elemental calcium total) by mouth 3 (three) times daily. Notes to patient: With meals    furosemide 80 MG tablet Commonly known as: LASIX Take 1 tablet (80 mg total) by mouth 2 (two) times daily.   ondansetron 4 MG disintegrating tablet Commonly known as: Zofran ODT Take 1 tablet (4 mg total) by mouth every 8 (eight) hours as needed for nausea or vomiting.   PRESCRIPTION MEDICATION Inject 6 Units into the skin daily. Medication:Insulin Vial     ASK your doctor about these medications   sodium bicarbonate 650 MG tablet Take 1 tablet (650 mg total) by mouth 2 (two) times daily. Ask about: Should I take this medication?            Durable Medical Equipment  (From admission, onward)         Start     Ordered   04/24/19 1646  For home use only DME Walker rolling  Once    Comments: Youth RW  Question:  Patient needs a walker to treat with the following condition  Answer:  ESRD (end stage renal disease) ([place])   04/24/19 1646         ADDENDUM: Patient was prescribed Percocet 7.5-325 mg every 8 hours as needed for 3 days for postoperative pain control initially on 04/24/2019.  The medication was delivered to her room via the transition of care pharmacy, in anticipation of being discharged.  However, the discharge was delayed to 04/24/2018 due to a delay in  fistula placement. The family picked up the medication and took at home on 12/31.  Disposition and follow-up:   [name] was discharged from [facility] in [place] condition. At the hospital follow up visit please address:  1.  ESRD: Patient was initially hospitalized on 11/27 with a 2-day history of pointed reproducible chest pain, shortness of breath, and symptomatic anemia and kidney failure. Patient was transfused multiple units of blood and was discharged on 03/25/2019. The patient's condition worsened and she was readmitted on 03/31/2019 with increased shortness of breath secondary to volume overload as a result of her ESRD.  The patient received dialysis treatments during this current hospitalization via tunnel catheter. LUE AVF was placed on 12/31 successfully. The patient will receive outpatient HD at [facility] on a TThSa schedule, starting this Saturday 04/26/2019   2. R upper extremity AVF placement: Successfully placed on 12/31 and was subsequently discharged on 04/25/2019. -Please check AVF site to ensure it is not infiltrated or developing an infection. -Percocet 7.5/325 mg q8hrs PRN for pain for 3 days prescribed  3. Anemia: Day before d/c Hgb dropped to 6.8. Transfused 1U pRBC and responded appropriately to 9.9. -Recheck Hgb at next visit  4.  Labs / imaging needed at time of follow-up: CBC, BMP   4.  Pending labs/ test  needing follow-up: none  Follow-up Appointments: Follow-up [place]. Go on 05/13/2019.   Specialty: Internal Medicine Why:  Patient with already scheduled follow up appointment for May 13, 2019 at 9:20am at Patient [place] information: [place] [id number] [place] Hospital Course by problem list: 1. ESRD 2. RUE AVF placement  3. Anemia   In summary, [name] is a Spanish-speaking  68 year old female with past medical history significant for CKD stage V, HTN, and T2DM who presented initially with a 2-day history of pointed chest pain, shortness of breath and symptomatic anemia (Hgb 6.0) on 11/27.  The patient received 2U pRBC and her hemoglobin stabilized. Her new baseline is around 8.0. Patient was put on supplemental oxygen and diuresed and had improvement of her respiratory status.  Despite extensive conversations with her and the family about the need for dialysis, they insisted on returning home. Pulmonary edema returedn and her respiratory status declined and presented again on 12/07 for this current admission. The patient was agreeable to dialysis during this hospitalization, and received it via central venous tunnel catheter. She had a successful right upper extremity AVF placed on 12/31. Of note, The day before d/c, her Hgb dropped to 6.8 and she was transfused one unit of blood and had approrpaite response to Hgb of 9.9.  The patient's case was challenging due to lack of insurance. She is at [place] on a visa visiting family. Case management and the renal navigator have found a facility, [facility], for $450 a month. The plan continue dialysis in the outpatient starting this Saturday 04/26/2019.   Discharge Vitals:   BP 126/64 (BP Location: Left Arm)   Pulse 91   Temp 98.7 F (37.1 C) (Oral)   Resp 18   Ht 4\' 10"  (1.473 m)   Wt 33.2 kg   SpO2 100%   BMI 15.30 kg/m   Pertinent Labs, Studies, and Procedures:  CBC Latest Ref Rng & Units 04/25/2019 04/24/2019 04/23/2019  WBC 4.0 - 10.5 K/uL 10.0 6.4 6.5  Hemoglobin 12.0 - 15.0 g/dL 9.9(L) 6.8(LL) 8.0(L)  Hematocrit 36.0 - 46.0 % 29.9(L) 21.0(L) 25.0(L)  Platelets 150 - 400 K/uL 336 285 364   BMP Latest Ref Rng & Units 04/24/2019 04/23/2019 04/22/2019  Glucose 70 - 99 mg/dL 86 101(H) 154(H)  BUN 8 - 23 mg/dL 18 48(H) 32(H)  Creatinine 0.44 - 1.00 mg/dL 2.02(H) 4.20(H) 2.62(H)  Sodium 135 - 145 mmol/L 135  131(L) 131(L)  Potassium 3.5 - 5.1 mmol/L 4.4 5.1 5.0  Chloride 98 - 111 mmol/L 97(L) 92(L) 91(L)  CO2 22 - 32 mmol/L 30 28 31   Calcium 8.9 - 10.3 mg/dL 8.2(L) 8.0(L) 8.2(L)   CXR (12/07) IMPRESSION: 1. Findings consistent with congestive heart failure. 2. Superimposed infectious process is not excluded.  IR Fluoro guide CV line (12/20) IMPRESSION: Successful placement of 19 cm tip to cuff tunneled hemodialysis catheter via the right internal jugular vein with tips terminating within the superior aspect of the right atrium. The catheter is ready for immediate use.  Discharge Instructions: Discharge Instructions    Call MD for:  difficulty breathing, headache or visual disturbances   Complete by: As directed    Call MD for:  extreme fatigue   Complete by: As directed    Call MD for:  hives   Complete by: As directed    Call MD  for:  persistant dizziness or light-headedness   Complete by: As directed    Call MD for:  persistant nausea and vomiting   Complete by: As directed    Call MD for:  redness, tenderness, or signs of infection (pain, swelling, redness, odor or green/yellow discharge around incision site)   Complete by: As directed    Call MD for:  severe uncontrolled pain   Complete by: As directed    Call MD for:  temperature >100.4   Complete by: As directed    Diet - low sodium heart healthy   Complete by: As directed    Discharge instructions   Complete by: As directed    Gracias por permitirnos atenderle durante su hospitalizacin. A continuacin se muestra un resumen de lo que tratamos:  1. Insuficiencia renal - Sus riones son responsables de Research officer, political party. Sus riones no estn Civil Service fast streamer, por lo que comenz la dilisis para que se pudiera Research officer, political party. Deber continuar con la dilisis cuando salga del hospital. - [place] Comment hicieron Ardelia Mems ciruga para que le colocaran una fstula en el brazo que se usar para dilisis despus de que salga del  hospital.   Si tiene alguna pregunta o inquietud, no dude en comunicarse con nosotros.   Increase activity slowly   Complete by: As directed      Signed: Earlene Plater, MD Internal Medicine, PGY1 Pager: [phone]  04/22/2019,11:36 AM

## 2019-04-22 NOTE — Progress Notes (Signed)
2W03 called [id number], spoke with patient's RN to inform her that the patient's surgery was cancelled.  Pt cancelled due to an emergency case.  MD will reschedule for Thursday Dec 3.

## 2019-04-22 NOTE — Progress Notes (Signed)
Physical Therapy Treatment Patient Details Name: Amber Dixon MRN: [id number] DOB: [birth year] Today's Date: 04/22/2019    History of Present Illness 68 yo female with onset of fluid overload, new HD need, PNA, SOB, renal failure and low Na+ was admitted and requires Spanish translation.  Covid (-).  Has PMHx:  R hip fracture with ORIF in [place], ESRD not with HD, HTN, DM,     PT Comments    Amber Dixon, spanish interpretor used for session. Pt functioning at supervision/min guard with RW. Pt needs a youth RW as pt is very short. Pt reports someone will be with her at all times. Pt with questions about procedure and what the fistula was, Amber Dixon able to help her understand. Acute PT to cont to follow.   Follow Up Recommendations  Home health PT;Supervision for mobility/OOB;Supervision/Assistance - 24 hour     Equipment Recommendations  Rolling walker with 5" wheels(YOUTH, pt is 4'8")    Recommendations for Other Services       Precautions / Restrictions Precautions Precautions: Fall Restrictions Weight Bearing Restrictions: No    Mobility  Bed Mobility Overal bed mobility: Needs Assistance Bed Mobility: Supine to Sit     Supine to sit: Supervision     General bed mobility comments: pt able to bring self to EOB with HOB flat, increased time due to pt stature/very short  Transfers Overall transfer level: Needs assistance Equipment used: Rolling walker (2 wheeled) Transfers: Sit to/from Stand Sit to Stand: Min guard         General transfer comment: min guard for safety, push up from bed  Ambulation/Gait Ambulation/Gait assistance: Min guard Gait Distance (Feet): 150 Feet Assistive device: Rolling walker (2 wheeled) Gait Pattern/deviations: Step-through pattern;Narrow base of support;Decreased stride length Gait velocity: dec Gait velocity interpretation: 1.31 - 2.62 ft/sec, indicative of limited community ambulator General Gait Details: pt's R LE  shorter than the L due to old hip fracture in [place]   Stairs             Wheelchair Mobility    Modified Rankin (Stroke Patients Only)       Balance Overall balance assessment: Needs assistance Sitting-balance support: Feet supported Sitting balance-Leahy Scale: Good     Standing balance support: Bilateral upper extremity supported;During functional activity Standing balance-Leahy Scale: Fair Standing balance comment: less than fair dynamically                            Cognition Arousal/Alertness: Awake/alert Behavior During Therapy: WFL for tasks assessed/performed Overall Cognitive Status: Within Functional Limits for tasks assessed                                 General Comments: pt with difficulty understanding the importance of fistula for HD however Amber Dixon, spanish interpretor was able toa ssist      Exercises      General Comments General comments (skin integrity, edema, etc.): pt requested to go bathroom, pt able to stand and perform hygiene without assist      Pertinent Vitals/Pain Pain Assessment: No/denies pain    Home Living                      Prior Function            PT Goals (current goals can now be found in the care plan section) Progress towards  PT goals: Progressing toward goals    Frequency    Min 2X/week      PT Plan Frequency needs to be updated    Co-evaluation              AM-PAC PT "6 Clicks" Mobility   Outcome Measure  Help needed turning from your back to your side while in a flat bed without using bedrails?: None Help needed moving from lying on your back to sitting on the side of a flat bed without using bedrails?: None Help needed moving to and from a bed to a chair (including a wheelchair)?: A Little Help needed standing up from a chair using your arms (e.g., wheelchair or bedside chair)?: A Little Help needed to walk in hospital room?: A Little Help needed  climbing 3-5 steps with a railing? : A Little 6 Click Score: 20    End of Session Equipment Utilized During Treatment: Gait belt Activity Tolerance: Patient tolerated treatment well Patient left: in chair;with call bell/phone within reach Nurse Communication: Mobility status PT Visit Diagnosis: Unsteadiness on feet (R26.81);Muscle weakness (generalized) (M62.81);Difficulty in walking, not elsewhere classified (R26.2);Dizziness and giddiness (R42)     Time: [id number] PT Time Calculation (min) (ACUTE ONLY): 37 min  Charges:  $Gait Training: 8-22 mins $Therapeutic Activity: 8-22 mins                     Amber Dixon, PT, DPT Acute Rehabilitation Services Pager #: [phone] Office #: [phone]    Amber Dixon 04/22/2019, 12:58 PM

## 2019-04-22 NOTE — Progress Notes (Signed)
Patient's OP HD seat schedule at [facility] is TTS at 11:45am. She needs to arrive at 11:25am for her appointments. Dr. Cristal Generous notified. Her first OP HD appointment will be Thursday, 04/24/19. She needs to arrive at 10:30am to complete intake paperwork prior to her first treatment.  Renal Navigator contacted patient's son, Jayme Cloud at [phone], who is the son who has agreed to pay for her OP HD treatments. Son was informed of clinic address, phone number and schedule and stated understanding. He reports that he lives in [place] and both he and his brother Clifton James (who lives in [place]) will be providing transportation for their mother to OP HD clinic.  Renal Navigator also informed patient's son that per Primary MD/Dr. Sheppard Coil, patient will be ready for discharge this afternoon after AVF surgery and that she needs to be picked up. He states he will pick her up if someone from the hospital will call when she is ready. Renal Navigator passed along this message to Primary. When asked if patient's son had any questions about patient's OP HD, he asked if his mother could be prescribed with insulin because she doesn't have any more from [place]. Renal Navigator informed him that this would be passed along to MD and Navigator sent this message to Dr. Sheppard Coil for review.  Patient is cleared for discharge from Renal/OP HD standpoint after surgery.  Alphonzo Cruise, [place] Renal Navigator [phone]

## 2019-04-22 NOTE — Progress Notes (Signed)
Admit: 03/31/2019 LOS: 22  77F new ESRD  Subjective:   For AVF today  Has THS SW [facility] Chair, starting 12/31  HD yesterday 0.4L UF  K 5.0 this AM  12/28 0701 - 12/29 0700 In: -  Out: Markle Weights   04/19/19 1300 04/19/19 1645 04/21/19 0655  Weight: 33.3 kg 31.9 kg 33.2 kg    Scheduled Meds: . amLODipine  5 mg Oral Daily  . benzonatate  200 mg Oral TID  . calcitRIOL  0.25 mcg Oral Daily  . calcium carbonate  1 tablet Oral TID WC  . Chlorhexidine Gluconate Cloth  6 each Topical Q0600  . darbepoetin (ARANESP) injection - DIALYSIS  150 mcg Intravenous Q Mon-HD  . famotidine  20 mg Oral Daily  . feeding supplement (ENSURE ENLIVE)  237 mL Oral TID BM  . insulin aspart  0-6 Units Subcutaneous Q4H  . mouth rinse  15 mL Mouth Rinse BID  . multivitamin  1 tablet Oral QHS  . sodium zirconium cyclosilicate  10 g Oral Once   Continuous Infusions: . cefUROXime (ZINACEF)  IV     PRN Meds:.acetaminophen, albuterol, dextrose, ondansetron (ZOFRAN) IV, polyethylene glycol, sodium chloride  Current Labs: reviewed    Physical Exam:  Blood pressure 126/64, pulse 91, temperature 98.7 F (37.1 C), temperature source Oral, resp. rate 18, height 4\' 10"  (1.473 m), weight 33.2 kg, SpO2 100 %. NAD, pleasant RRR  CTAB No sig LEE TDC R IJ C/D/I  A 1. New ESRD, on MWF Schedule.  Accepted to [facility] start 12/31.  Has TDC, for AVF today with [facility].  2. Acute resp failure: improved 3. 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   5. Mild hyponatremia, CTM 6. HTN: stable at current time  P . AVF today . Can DC after AVF today, next Tx 12/31 at [facility] . Given K 5.0 would give lokelma 10gm x1 today . Medication Issues; o Preferred narcotic agents for pain control are hydromorphone, fentanyl, and methadone. Morphine should not be used.  o Baclofen should be avoided o Avoid oral sodium phosphate and magnesium citrate based laxatives / bowel preps    Pearson Grippe  MD 04/22/2019, 11:43 AM  Recent Labs  Lab 04/19/19 1332 04/21/19 0642 04/22/19 0338  NA 130* 132* 131*  K 4.9 5.5* 5.0  CL 95* 92* 91*  CO2 23 29 31   GLUCOSE 235* 111* 154*  BUN 67* 50* 32*  CREATININE 5.80* 4.19* 2.62*  CALCIUM 7.7* 8.1* 8.2*  PHOS 4.3 4.0 2.8   Recent Labs  Lab 04/18/19 0200 04/19/19 0812 04/21/19 0550  WBC 6.2 6.7 7.0  HGB 8.2* 7.8* 8.1*  HCT 25.6* 23.8* 25.5*  MCV 85.3 84.7 87.6  PLT 252 289 331

## 2019-04-22 NOTE — Progress Notes (Addendum)
   Subjective:    Pt seen at the bedside this AM. Patient states that her breathing is fine and she has no pain anywhere.  I asked the patient if she understood why she is getting dialysis and needs an AVF placed, and she stated "I don't know much of anything." We discussed why the patient needs to continue to get dialysis because of her failing kidneys.  I also explained why she needed a fistula in her arm to make dialysis easier.  Patient endorses a better understanding. The patient states that she is hungry.  I explained to her that she is scheduled to have surgery today and she will be allowed to eat after.  Patient is in agreement.  No other complaints at this time.  Objective:  Vital signs in last 24 hours: Vitals:   04/21/19 1603 04/21/19 2005 04/22/19 0056 04/22/19 0410  BP: (!) 142/65  139/68   Pulse: 89  87   Resp: 16  13   Temp: 98.5 F (36.9 C) 98.5 F (36.9 C) 99 F (37.2 C) 98.8 F (37.1 C)  TempSrc: Oral Oral Oral Oral  SpO2: 100%     Weight:      Height:       Physical Exam General: Comfortable appearing, resting in bed HEENT: NCAT CV: RRR, normal S1-S2 no murmurs rubs or gallops appreciated PULM: CTAB, no crackles or wheezes appreciated ABD: Soft and nontender in all quadrants NEURO: Alert and oriented, nonfocal  Assessment/Plan:  Principal Problem:   ESRD (end stage renal disease) on dialysis [facility]) Active Problems:   Acute respiratory failure with hypoxemia ([place])  In summary, [name] is a 68 year old female with past medical history significant for CKD stage V, HTN, T2DM who was admitted for worsening renal failure, requiring HD.   The patient's case is challenging due to lack of insurance. She is also in the [place] on a visa. Originally the plan was to have the patient go back to [place] she would have universal healthcare, but it appears that case management and the renal navigator have found a facility, [facility], for $450 a month.  The  new plan is for the patient to stay here and continue dialysis in the outpatient setting.  #ESRD: S/p tunneled catheter placed on 04/13/2019.  Vein mapping complete.  Dr. Oneida Alar with vascular surgery is scheduled to take the patient to the OR today around 12:30 for AVF placement in the right upper extremity. -F/u vascular surgery recommendations  #T2DM -SSI  #Nausea -Zofran PRN   #FEN/GI -Diet: Renal -Fluids: None  #DVT prophylaxis - Heparin   #CODE STATUS: FULL  #Dispo: Anticipated discharge pending medical course.  Earlene Plater, MD Internal Medicine, PGY1 Pager: [phone]  04/22/2019,8:08 AM

## 2019-04-22 NOTE — Progress Notes (Signed)
Used video interpreter [phone] for preop interview.

## 2019-04-22 NOTE — Discharge Instructions (Signed)
Dilisis Dialysis La dilisis es un procedimiento que se realiza cuando los riones han dejado de funcionar adecuadamente (insuficiencia renal). Tambin podra indicarse antes si pudiera mejorar los sntomas. Durante la dilisis, se eliminan los desechos, las sales y Best boy de agua de la sangre y se mantiene el nivel de ciertos minerales en la [place]. La dilisis se realiza en sesiones que [employer] riones [place]. Si los riones no mejoran, como sucede en la enfermedad renal terminal, la dilisis se contina de por vida o hasta que usted pueda recibir un nuevo rin de un donante (trasplante renal). Hay dos tipos de dilisis: hemodilisis y dilisis peritoneal. Qu es la hemodilisis?        La hemodilisis es cuando se [place] una mquina llamada dializador para Curator [place]. Antes de comenzar la hemodilisis, le harn una ciruga para crear un lugar en el que la sangre pueda extraerse del cuerpo y volver a ingresar en l (acceso vascular). Hay tres tipos de accesos vasculares:  Fstula arteriovenosa. Para crear este tipo de acceso, se conectan una arteria y [place] vena (por lo general, del brazo) durante [place]. La fstula arteriovenosa, generalmente, demora de 1 a 59meses en formarse despus de la ciruga. Puede durar ms [employer] otros tipos de [place] y es menos probable que se infecte o que se formen cogulos de [place].  Injerto arteriovenoso. Para crear este tipo de acceso, se conectan una arteria y [place] vena del brazo con un tubo durante una ciruga. El injerto arteriovenoso, generalmente, puede usarse en el trmino de 2 a 3semanas despus de la [place].  Catter venoso. Para crear este tipo de acceso, se coloca un tubo delgado (catter) en una vena grande del cuello, el trax o la ingle. El catter venoso puede usarse inmediatamente. Por lo general, se [place] como un acceso temporario cuando es necesario que la dilisis comience de inmediato. Durante la  hemodilisis, la [employer] del cuerpo a travs del Environmental consultant de Paramedic. Viaja a travs del tubo al dializador, donde se Animal nutritionist. Luego la sangre regresa al cuerpo a travs de otro tubo. La hemodilisis, generalmente, se realiza [employer] o en un centro de dilisis, tres veces por semana. Las sesiones duran entre 3 y Teacher, English as a foreign language. Con una capacitacin especial, tambin puede Development worker, international aid, con la ayuda de otra persona. Qu es la dilisis peritoneal? La dilisis peritoneal es cuando se utiliza la membrana delgada que tapiza el abdomen (peritoneo) y un lquido llamado dialisato para Investment banker, corporate. Antes de comenzar con la dilisis peritoneal, le harn una ciruga para colocarle un catter en el abdomen. El catter se usar para introducir y Regulatory affairs officer del abdomen el dialisato. Al inicio de la sesin, le llenarn el abdomen con dialisato. Durante la sesin, los desechos, las sales y Best boy de lquidos de la sangre pasan a travs del peritoneo y [place]. El dialisato se drena del cuerpo al finalizar la sesin. El proceso de llenar y drenar el dialisato se llama intercambio. Los intercambios se repiten [employer] haya usado todo el dialisato del da. Usted puede realizar la dilisis peritoneal en su hogar o en casi Therapist, music. Se realiza [employer]. Es posible que necesite hasta cinco intercambios por Training and development officer. Cada intercambio dura entre 30 y 40 minutos. La cantidad de tiempo que permanece el dialisato en el organismo entre los intercambios se llama tiempo de permanencia. El [place] de [place], [place], dura entre 1,5 y 42horas y [place]  variar con cada persona. Puede escoger hacer los intercambios por la noche mientras duerme, mediante una mquina Pharmacist, community. Qu tipo de dilisis debo elegir? Ambos tipos de dilisis tienen ventajas y desventajas. Hable con su mdico acerca de qu tipo de dilisis es el mejor para usted. Hay que considerar sus preferencias, su estilo de vida  y su enfermedad. En algunos casos, puede elegirse solo un tipo de dilisis. Ventajas de la hemodilisis  Se realiza con menos frecuencia que la dilisis peritoneal.  Otra persona puede hacer la dilisis por usted.  Si se dirige a Doctor, general practice de dilisis: ? Su mdico puede reconocer cualquier problema que pueda tener. ? Puede interactuar con [employer] a quienes les hacen dilisis. Aqu podr encontrar apoyo emocional. Desventajas de la hemodilisis  La hemodilisis puede causar calambres e hipotensin arterial. Puede dejarle una sensacin de [employer] Corporation en que le realizan el [place].  Si concurre a un centro de dilisis, deber concertar citas semanales en los horarios disponibles del centro.  Deber tener ms cuidado cuando viaje. Si suele tratarse en un centro de dilisis, ser necesario planificar visitas a un centro de dilisis cerca de su destino. Si realiza el tratamiento en su casa, ser necesario que lleve el dializador con usted cuando viaje.  Hay ms restricciones en la alimentacin que con la dilisis peritoneal. Ventajas de la dilisis peritoneal  Es menos probable que cause calambres e hipotensin arterial.  Hay menos restricciones en la alimentacin que con la hemodilisis.  Podr realizar los intercambios usted mismo, dondequiera que se encuentre, incluso cuando viaje. Desventajas de la dilisis peritoneal  Debe realizarse con ms frecuencia que la hemodilisis.  Realizar una dilisis peritoneal exige un uso adecuado (destreza) de las [place]. Tambin debe ser capaz de levantar bolsas.  Deba aprender cmo deshacerse de los grmenes en su equipo (tcnicas de esterilizacin). Necesitar usar estas tcnicas todos los [place] para prevenir una infeccin. Qu cambios deber hacer en la alimentacin durante la dilisis? Ambos tipos de dilisis requieren que haga algunos cambios en su alimentacin. Por ejemplo, deber limitar la ingesta de alimentos con alto  contenido de fsforo y Field seismologist. Tambin deber limitar la ingesta de lquidos. Un especialista en alimentacin y nutricin (nutricionista) lo puede ayudar a Paediatric nurse un plan de comidas que lo ayude a Teacher, English as a foreign language su dilisis y Music therapist. Qu debo esperar al comenzar la dilisis? Adaptarse al tratamiento de dilisis, a las citas programadas y a Youth worker puede tomar Ambulance person. Puede que sea necesario que deje de trabajar y no pueda hacer algunas de sus actividades normales. Es probable que se sienta ansioso o deprimido cuando inicie la dilisis. Con el tiempo, muchas personas se sienten mejor gracias a la dilisis. Es posible que vuelva a trabajar despus de Production designer, theatre/television/film cambios, como disminuir la intensidad del [place]. Dnde encontrar ms informacin  Fundacin Nacional del Rin ([place]): www.kidney.org  Asociacin Americana de Pacientes Renales (American Association of Kidney Patients, AAKP): BombTimer.gl  [place] para Problemas Renales ([place]): www.kidneyfund.org Resumen  Durante la dilisis, se eliminan los desechos, las sales y Best boy de agua de la sangre y se mantiene el nivel de ciertos minerales en la [place]. Hay dos tipos de dilisis: hemodilisis y dilisis peritoneal.  La hemodilisis es cuando se [place] una mquina llamada dializador para Curator [place].  La hemodilisis, generalmente, la realiza un mdico en el hospital o en un centro de dilisis, tres veces por semana.  La dilisis peritoneal es  cuando el peritoneo se utiliza como filtro. Usted puede realizar la dilisis peritoneal en su hogar o en casi Therapist, music.  Ambos tipos de dilisis tienen ventajas y desventajas. Hable con su mdico acerca de qu tipo de dilisis es el mejor para usted. Esta informacin no tiene Marine scientist el consejo del mdico. Asegrese de hacerle al mdico cualquier pregunta que tenga. Document Released: 04/10/2005 Document  Revised: 07/06/2017 Document Reviewed: 10/12/2016 Elsevier Patient Education  2020 [employer].

## 2019-04-22 NOTE — Anesthesia Preprocedure Evaluation (Addendum)
Anesthesia Evaluation  Patient identified by MRN, date of birth, ID band Patient awake    Reviewed: Allergy & Precautions, H&P , NPO status , Patient's Chart, lab work & pertinent test results, reviewed documented beta blocker date and time   Airway Mallampati: III  TM Distance: >3 FB Neck ROM: full   Comment: LARGE (THUMB SIZE) PEDUNCULATED  POLYP DORSAL SURFACE TONGUE mobile and non obstructive according to patient.   Dental no notable dental hx. (+) Edentulous Upper, Edentulous Lower   Pulmonary neg pulmonary ROS,    Pulmonary exam normal breath sounds clear to auscultation       Cardiovascular Exercise Tolerance: Good hypertension, Pt. on medications +CHF   Rhythm:regular Rate:Normal  ECHO 11/20 FINDINGS  Left Ventricle: Left ventricular ejection fraction, by visual estimation, is 55 to 60%. The left ventricle has normal function. There is borderline left ventricular hypertrophy. Left ventricular diastolic parameters are consistent with Grade I diastolic  dysfunction    Neuro/Psych negative neurological ROS  negative psych ROS   GI/Hepatic negative GI ROS, Neg liver ROS,   Endo/Other  negative endocrine ROSdiabetes  Renal/GU ESRFRenal disease  negative genitourinary   Musculoskeletal negative musculoskeletal ROS (+)   Abdominal   Peds  Hematology  (+) Blood dyscrasia, anemia ,   Anesthesia Other Findings   Reproductive/Obstetrics negative OB ROS                           Anesthesia Physical Anesthesia Plan  ASA: III  Anesthesia Plan: MAC   Post-op Pain Management:    Induction:   PONV Risk Score and Plan: 2  Airway Management Planned: Nasal Cannula, Simple Face Mask and Mask  Additional Equipment:   Intra-op Plan:   Post-operative Plan:   Informed Consent: I have reviewed the patients History and Physical, chart, labs and discussed the procedure including the risks,  benefits and alternatives for the proposed anesthesia with the patient or authorized representative who has indicated his/her understanding and acceptance.     Dental Advisory Given  Plan Discussed with: CRNA, Anesthesiologist and Surgeon  Anesthesia Plan Comments:         Anesthesia Quick Evaluation

## 2019-04-23 LAB — BASIC METABOLIC PANEL
Anion gap: 11 (ref 5–15)
BUN: 48 mg/dL — ABNORMAL HIGH (ref 8–23)
CO2: 28 mmol/L (ref 22–32)
Calcium: 8 mg/dL — ABNORMAL LOW (ref 8.9–10.3)
Chloride: 92 mmol/L — ABNORMAL LOW (ref 98–111)
Creatinine, Ser: 4.2 mg/dL — ABNORMAL HIGH (ref 0.44–1.00)
GFR calc Af Amer: 12 mL/min — ABNORMAL LOW (ref >60)
GFR calc non Af Amer: 10 mL/min — ABNORMAL LOW (ref >60)
Glucose, Bld: 101 mg/dL — ABNORMAL HIGH (ref 70–99)
Potassium: 5.1 mmol/L (ref 3.5–5.1)
Sodium: 131 mmol/L — ABNORMAL LOW (ref 135–145)

## 2019-04-23 LAB — GLUCOSE, CAPILLARY
Glucose-Capillary: 104 mg/dL — ABNORMAL HIGH (ref 70–99)
Glucose-Capillary: 139 mg/dL — ABNORMAL HIGH (ref 70–99)
Glucose-Capillary: 144 mg/dL — ABNORMAL HIGH (ref 70–99)
Glucose-Capillary: 155 mg/dL — ABNORMAL HIGH (ref 70–99)
Glucose-Capillary: 160 mg/dL — ABNORMAL HIGH (ref 70–99)
Glucose-Capillary: 93 mg/dL (ref 70–99)

## 2019-04-23 LAB — CBC
HCT: 25 % — ABNORMAL LOW (ref 36.0–46.0)
Hemoglobin: 8 g/dL — ABNORMAL LOW (ref 12.0–15.0)
MCH: 27.8 pg (ref 26.0–34.0)
MCHC: 32 g/dL (ref 30.0–36.0)
MCV: 86.8 fL (ref 80.0–100.0)
Platelets: 364 10*3/uL (ref 150–400)
RBC: 2.88 MIL/uL — ABNORMAL LOW (ref 3.87–5.11)
RDW: 13.7 % (ref 11.5–15.5)
WBC: 6.5 10*3/uL (ref 4.0–10.5)
nRBC: 0 % (ref 0.0–0.2)

## 2019-04-23 LAB — HEPATITIS B SURFACE ANTIGEN: Hepatitis B Surface Ag: NONREACTIVE

## 2019-04-23 MED ORDER — SODIUM ZIRCONIUM CYCLOSILICATE 10 G PO PACK
10.0000 g | PACK | Freq: Once | ORAL | Status: DC
  Filled 2019-04-23: qty 1

## 2019-04-23 MED ORDER — HEPARIN SODIUM (PORCINE) 1000 UNIT/ML IJ SOLN
INTRAMUSCULAR | Status: AC
  Filled 2019-04-23: qty 2

## 2019-04-23 MED ORDER — HEPARIN SODIUM (PORCINE) 1000 UNIT/ML DIALYSIS: 20.0000 [IU]/kg | INTRAMUSCULAR | Status: DC | PRN

## 2019-04-23 MED ORDER — CEFAZOLIN SODIUM-DEXTROSE 1-4 GM/50ML-% IV SOLN
1.0000 g | INTRAVENOUS | Status: AC
  Administered 2019-04-24: 1 g via INTRAVENOUS
  Filled 2019-04-23: qty 50

## 2019-04-23 NOTE — Progress Notes (Signed)
   Subjective:   Patient seen at the bedside on rounds this morning.  Patient states that she is doing well today. She is aware that her surgery was delayed yesterday due to several OR emergencies. She has not had breakfast today because she was sleeping when the food arrived. Overall, she is doing well and denies any complaints.   Objective:  Vital signs in last 24 hours: Vitals:   04/22/19 2007 04/23/19 0005 04/23/19 0405 04/23/19 0755  BP:  128/62 (!) 141/65 (!) 143/61  Pulse:   78 79  Resp:   12 16  Temp: 98.1 F (36.7 C) 98.9 F (37.2 C) 98.7 F (37.1 C) 98.5 F (36.9 C)  TempSrc: Oral Oral Oral Oral  SpO2:   100% 99%  Weight:      Height:       Physical Exam General: Resting in bed comfortably, NAD HEENT: NCAT CV: RRR, normal S1-S2 no murmurs rubs or gallops appreciated. Extremities warm and well-perfused PULM: Clear to auscultation bilaterally, no crackles or wheezes appreciated ABD: Bowel sounds present, soft and nontender in all quadrants NEURO: Alert and oriented, no focal deficits noted  Assessment/Plan:  Principal Problem:   ESRD (end stage renal disease) on dialysis [facility]) Active Problems:   Acute respiratory failure with hypoxemia ([place])  In summary, [name] is a 68 year old female with past medical history significant for CKD stage V, HTN, T2DM who was admitted for worsening renal failure, requiring HD.  The patient was scheduled to be discharged yesterday, however her aVF placement was delayed due to an emergent case.  She's scheduled for her aVF placement tomorrow, 12/31. Upon discharge, patient will receive dialysis at [facility] clinic.  #ESRD: S/p tunneled catheter placed on 04/13/2019.  Vein mapping complete. Dr. Oneida Alar with vascular surgery is scheduled to take the patient to the OR on 12/31 at 12:45PM. -F/u vascular surgery recommendations - Will reach out to Nephrology to see if pt needs an additional dialysis treatment prior to  discharge  #T2DM -SSI  #Nausea -Zofran PRN  #FEN/GI -Diet: Renal -Fluids: None  #DVT prophylaxis - Heparin   #CODE STATUS: FULL  #Dispo: Anticipated discharge after AVF is placed by tomorrow. Patient will receive dialysis at [facility] clinic upon discharge.  Earlene Plater, MD Internal Medicine, PGY1 Pager: [phone]  04/23/2019,10:51 AM

## 2019-04-23 NOTE — Progress Notes (Signed)
Nutrition Follow-up  INTERVENTION:   ContinueEnsure Enlive poTID, each supplement provides 350 kcal and 20 grams of protein  Magic cupBID with meals, each supplement provides 290 kcal and 9 grams of protein  Continue Rena-Vit  NUTRITION DIAGNOSIS:   Inadequate oral intake related to poor appetite, acute illness as evidenced by meal completion < 25%.  Ongoing.  GOAL:   Patient will meet greater than or equal to 90% of their needs  Progressing.  MONITOR:   PO intake, Supplement acceptance, Labs, Weight trends  ASSESSMENT:   68 yo female admitted with progressive CKD to ESRD requiring intiation of HD, acute respiratory failure with pulmonary edema. PMH includes DM, CKD, HTN  Currently not a lot of PO documentation in chart. Noted that pt slept through breakfast this morning. Pt currently in HD. Pt was originally scheduled for AVF placement yesterday and was planning on discharge afterwards. Surgery was cancelled and rescheduled for 12/31. Pt expected to d/c following surgery.  Pt is drinking Ensure supplements. Will continue as pt's weight has decreased and pt is not eating that well.   Admission weight: 78 lbs. Current weight: 77 lbs.  I/Os: -1.3L since 12/16 HD 12/29: 425 ml UF  Medications: Calcium carbonate tablet, Rena-vit tablet, Calcitriol Labs reviewed: CBGs: 139-160 Low Na  Diet Order:   Diet Order            Diet NPO time specified Except for: Sips with Meds  Diet effective midnight        Diet renal with fluid restriction Fluid restriction: 1200 mL Fluid; Room service appropriate? Yes; Fluid consistency: Thin  Diet effective now        Diet - low sodium heart healthy              EDUCATION NEEDS:   Not appropriate for education at this time  Skin:  Skin Assessment: Reviewed RN Assessment  Last BM:  12/30 -type 6  Height:   Ht Readings from Last 1 Encounters:  04/09/19 4\' 10"  (1.473 m)    Weight:   Wt Readings from Last 1  Encounters:  04/23/19 35 kg    Ideal Body Weight:     BMI:  Body mass index is 16.13 kg/m.  Estimated Nutritional Needs:   Kcal:  1200-1400 kcals  Protein:  60-70 g  Fluid:  1000 mL plus UOP  Clayton Bibles, MS, RD, LDN Inpatient Clinical Dietitian Pager: [phone] After Hours Pager: [phone]

## 2019-04-23 NOTE — Progress Notes (Signed)
Since patient was not discharged yesterday as originally planned, her start in the OP HD clinic has now changed. She will not start in the clinic tomorrow, 04/24/19. Patient will start in the OP HD clinic on Sunday, 04/27/19 (due to the [place] holiday schedule) at the same time, 10:30am arrival for intake paperwork prior to treatment. OP HD clinic updated. Renal Navigator used [employer] to contact patient's son/Federico to inform. Patient's son stated understanding and appreciation of update.  Alphonzo Cruise,  Renal Navigator [phone]

## 2019-04-23 NOTE — Progress Notes (Signed)
RN held bp meds for HD this afternoon.

## 2019-04-23 NOTE — Progress Notes (Signed)
Admit: 03/31/2019 LOS: 23  23F new ESRD  Subjective:   AVF postponed to 12/31  No c/o  Has THS [facility], starting 12/31  No intake/output data recorded.  Filed Weights   04/19/19 1300 04/19/19 1645 04/21/19 0655  Weight: 33.3 kg 31.9 kg 33.2 kg    Scheduled Meds: . amLODipine  5 mg Oral Daily  . benzonatate  200 mg Oral TID  . calcitRIOL  0.25 mcg Oral Daily  . calcium carbonate  1 tablet Oral TID WC  . Chlorhexidine Gluconate Cloth  6 each Topical Q0600  . darbepoetin (ARANESP) injection - DIALYSIS  150 mcg Intravenous Q Mon-HD  . famotidine  20 mg Oral Daily  . feeding supplement (ENSURE ENLIVE)  237 mL Oral TID BM  . insulin aspart  0-6 Units Subcutaneous Q4H  . mouth rinse  15 mL Mouth Rinse BID  . multivitamin  1 tablet Oral QHS  . sodium zirconium cyclosilicate  10 g Oral Once   Continuous Infusions: . sodium chloride    . [START ON 04/24/2019]  ceFAZolin (ANCEF) IV    . cefUROXime (ZINACEF)  IV     PRN Meds:.acetaminophen, albuterol, dextrose, ondansetron (ZOFRAN) IV, polyethylene glycol, sodium chloride  Current Labs: reviewed    Physical Exam:  Blood pressure (!) 130/93, pulse 91, temperature 98.2 F (36.8 C), temperature source Oral, resp. rate 16, height 4\' 10"  (1.473 m), weight 33.2 kg, SpO2 100 %. NAD, pleasant RRR  CTAB No sig LEE TDC R IJ C/D/I  A 1. New ESRD, on MWF Schedule.  Accepted to [facility] [place] THS start 12/31.  Has TDC, for AVF 12/31 with [facility].  2. Acute resp failure: resolved 3. 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   5. Mild hyponatremia, CTM 6. HTN: stable at current time  P . HD tomorrow, will work aroung surgery . Next HD will be outpt on Sunday (holiday schedule),,will be THS as outpt . Medication Issues; o Preferred narcotic agents for pain control are hydromorphone, fentanyl, and methadone. Morphine should not be used.  o Baclofen should be avoided o Avoid oral sodium phosphate and magnesium citrate  based laxatives / bowel preps    Pearson Grippe MD 04/23/2019, 12:27 PM  Recent Labs  Lab 04/19/19 1332 04/21/19 0642 04/22/19 0338 04/23/19 0413  NA 130* 132* 131* 131*  K 4.9 5.5* 5.0 5.1  CL 95* 92* 91* 92*  CO2 23 29 31 28   GLUCOSE 235* 111* 154* 101*  BUN 67* 50* 32* 48*  CREATININE 5.80* 4.19* 2.62* 4.20*  CALCIUM 7.7* 8.1* 8.2* 8.0*  PHOS 4.3 4.0 2.8  --    Recent Labs  Lab 04/19/19 0812 04/21/19 0550 04/23/19 0413  WBC 6.7 7.0 6.5  HGB 7.8* 8.1* 8.0*  HCT 23.8* 25.5* 25.0*  MCV 84.7 87.6 86.8  PLT 289 331 364

## 2019-04-24 ENCOUNTER — Encounter (HOSPITAL_COMMUNITY): Payer: Self-pay | Admitting: Internal Medicine

## 2019-04-24 ENCOUNTER — Encounter (HOSPITAL_COMMUNITY)
Admission: EM | Disposition: A | Discharge status: Home / Self Care | Payer: Self-pay | Source: Home / Self Care | Attending: Internal Medicine

## 2019-04-24 ENCOUNTER — Inpatient Hospital Stay (HOSPITAL_COMMUNITY): Payer: Self-pay | Admitting: Anesthesiology

## 2019-04-24 DIAGNOSIS — N185 Chronic kidney disease, stage 5: Secondary | ICD-10-CM

## 2019-04-24 HISTORY — PX: AV FISTULA PLACEMENT: SHX1204

## 2019-04-24 LAB — CBC
HCT: 21 % — ABNORMAL LOW (ref 36.0–46.0)
Hemoglobin: 6.8 g/dL — CL (ref 12.0–15.0)
MCH: 28.1 pg (ref 26.0–34.0)
MCHC: 32.4 g/dL (ref 30.0–36.0)
MCV: 86.8 fL (ref 80.0–100.0)
Platelets: 285 10*3/uL (ref 150–400)
RBC: 2.42 MIL/uL — ABNORMAL LOW (ref 3.87–5.11)
RDW: 13.9 % (ref 11.5–15.5)
WBC: 6.4 10*3/uL (ref 4.0–10.5)
nRBC: 0.3 % — ABNORMAL HIGH (ref 0.0–0.2)

## 2019-04-24 LAB — GLUCOSE, CAPILLARY
Glucose-Capillary: 100 mg/dL — ABNORMAL HIGH (ref 70–99)
Glucose-Capillary: 111 mg/dL — ABNORMAL HIGH (ref 70–99)
Glucose-Capillary: 166 mg/dL — ABNORMAL HIGH (ref 70–99)
Glucose-Capillary: 179 mg/dL — ABNORMAL HIGH (ref 70–99)
Glucose-Capillary: 212 mg/dL — ABNORMAL HIGH (ref 70–99)
Glucose-Capillary: 230 mg/dL — ABNORMAL HIGH (ref 70–99)
Glucose-Capillary: 73 mg/dL (ref 70–99)

## 2019-04-24 LAB — PREPARE RBC (CROSSMATCH)

## 2019-04-24 LAB — BASIC METABOLIC PANEL
Anion gap: 8 (ref 5–15)
BUN: 18 mg/dL (ref 8–23)
CO2: 30 mmol/L (ref 22–32)
Calcium: 8.2 mg/dL — ABNORMAL LOW (ref 8.9–10.3)
Chloride: 97 mmol/L — ABNORMAL LOW (ref 98–111)
Creatinine, Ser: 2.02 mg/dL — ABNORMAL HIGH (ref 0.44–1.00)
GFR calc Af Amer: 28 mL/min — ABNORMAL LOW (ref >60)
GFR calc non Af Amer: 24 mL/min — ABNORMAL LOW (ref >60)
Glucose, Bld: 86 mg/dL (ref 70–99)
Potassium: 4.4 mmol/L (ref 3.5–5.1)
Sodium: 135 mmol/L (ref 135–145)

## 2019-04-24 SURGERY — ARTERIOVENOUS (AV) FISTULA CREATION
Anesthesia: General | Site: Arm Upper | Laterality: Right

## 2019-04-24 MED ORDER — OXYCODONE-ACETAMINOPHEN 7.5-325 MG PO TABS: 1.0000 | ORAL_TABLET | Freq: Three times a day (TID) | ORAL | 0 refills | Status: DC | PRN

## 2019-04-24 MED ORDER — ONDANSETRON HCL 4 MG/2ML IJ SOLN
INTRAMUSCULAR | Status: DC | PRN
  Administered 2019-04-24: 4 mg via INTRAVENOUS

## 2019-04-24 MED ORDER — HYDROCODONE-ACETAMINOPHEN 5-325 MG PO TABS
1.0000 | ORAL_TABLET | ORAL | Status: DC | PRN
  Administered 2019-04-24: 2 via ORAL
  Filled 2019-04-24: qty 2

## 2019-04-24 MED ORDER — PROPOFOL 10 MG/ML IV BOLUS
INTRAVENOUS | Status: DC | PRN
  Administered 2019-04-24: 150 mg via INTRAVENOUS

## 2019-04-24 MED ORDER — SODIUM CHLORIDE 0.9% IV SOLUTION: Freq: Once | INTRAVENOUS | Status: DC

## 2019-04-24 MED ORDER — LIDOCAINE 2% (20 MG/ML) 5 ML SYRINGE
INTRAMUSCULAR | Status: DC | PRN
  Administered 2019-04-24: 80 mg via INTRAVENOUS

## 2019-04-24 MED ORDER — EPHEDRINE SULFATE-NACL 50-0.9 MG/10ML-% IV SOSY
PREFILLED_SYRINGE | INTRAVENOUS | Status: DC | PRN
  Administered 2019-04-24: 10 mg via INTRAVENOUS

## 2019-04-24 MED ORDER — MIDAZOLAM HCL 2 MG/2ML IJ SOLN
INTRAMUSCULAR | Status: AC
  Filled 2019-04-24: qty 2

## 2019-04-24 MED ORDER — SODIUM CHLORIDE 0.9 % IV SOLN
INTRAVENOUS | Status: DC | PRN
  Administered 2019-04-24: 500 mL

## 2019-04-24 MED ORDER — LIDOCAINE HCL (PF) 1 % IJ SOLN
INTRAMUSCULAR | Status: AC
  Filled 2019-04-24: qty 30

## 2019-04-24 MED ORDER — SODIUM CHLORIDE 0.9 % IV SOLN
INTRAVENOUS | Status: AC
  Filled 2019-04-24: qty 1.2

## 2019-04-24 MED ORDER — 0.9 % SODIUM CHLORIDE (POUR BTL) OPTIME
TOPICAL | Status: DC | PRN
  Administered 2019-04-24: 1000 mL

## 2019-04-24 MED ORDER — SODIUM CHLORIDE 0.9 % IV SOLN: INTRAVENOUS | Status: DC | PRN

## 2019-04-24 MED ORDER — FENTANYL CITRATE (PF) 100 MCG/2ML IJ SOLN
INTRAMUSCULAR | Status: DC | PRN
  Administered 2019-04-24: 50 ug via INTRAVENOUS

## 2019-04-24 MED ORDER — DEXAMETHASONE SODIUM PHOSPHATE 4 MG/ML IJ SOLN
INTRAMUSCULAR | Status: DC | PRN
  Administered 2019-04-24: 5 mg via INTRAVENOUS

## 2019-04-24 MED ORDER — FENTANYL CITRATE (PF) 250 MCG/5ML IJ SOLN
INTRAMUSCULAR | Status: AC
  Filled 2019-04-24: qty 5

## 2019-04-24 MED ORDER — SODIUM CHLORIDE 0.9 % IV SOLN: INTRAVENOUS | Status: DC

## 2019-04-24 SURGICAL SUPPLY — 28 items
ARMBAND PINK RESTRICT EXTREMIT (MISCELLANEOUS) ×2 IMPLANT
CANISTER SUCT 3000ML PPV (MISCELLANEOUS) ×2 IMPLANT
CLIP VESOCCLUDE MED 6/CT (CLIP) ×2 IMPLANT
CLIP VESOCCLUDE SM WIDE 6/CT (CLIP) ×2 IMPLANT
COVER PROBE W GEL 5X96 (DRAPES) IMPLANT
COVER WAND RF STERILE (DRAPES) ×2 IMPLANT
DERMABOND ADVANCED (GAUZE/BANDAGES/DRESSINGS) ×1
DERMABOND ADVANCED .7 DNX12 (GAUZE/BANDAGES/DRESSINGS) ×1 IMPLANT
ELECT REM PT RETURN 9FT ADLT (ELECTROSURGICAL) ×2
ELECTRODE REM PT RTRN 9FT ADLT (ELECTROSURGICAL) ×1 IMPLANT
GLOVE BIO SURGEON STRL SZ7.5 (GLOVE) ×2 IMPLANT
GOWN STRL REUS W/ TWL LRG LVL3 (GOWN DISPOSABLE) ×2 IMPLANT
GOWN STRL REUS W/ TWL XL LVL3 (GOWN DISPOSABLE) ×2 IMPLANT
GOWN STRL REUS W/TWL LRG LVL3 (GOWN DISPOSABLE) ×2
GOWN STRL REUS W/TWL XL LVL3 (GOWN DISPOSABLE) ×2
INSERT FOGARTY SM (MISCELLANEOUS) IMPLANT
KIT BASIN OR (CUSTOM PROCEDURE TRAY) ×2 IMPLANT
KIT TURNOVER KIT B (KITS) ×2 IMPLANT
NS IRRIG 1000ML POUR BTL (IV SOLUTION) ×2 IMPLANT
PACK CV ACCESS (CUSTOM PROCEDURE TRAY) ×2 IMPLANT
PAD ARMBOARD 7.5X6 YLW CONV (MISCELLANEOUS) ×4 IMPLANT
SUT MNCRL AB 4-0 PS2 18 (SUTURE) ×2 IMPLANT
SUT PROLENE 6 0 BV (SUTURE) ×6 IMPLANT
SUT VIC AB 3-0 SH 27 (SUTURE) ×1
SUT VIC AB 3-0 SH 27X BRD (SUTURE) ×1 IMPLANT
TOWEL GREEN STERILE (TOWEL DISPOSABLE) ×2 IMPLANT
UNDERPAD 30X30 (UNDERPADS AND DIAPERS) ×2 IMPLANT
WATER STERILE IRR 1000ML POUR (IV SOLUTION) ×2 IMPLANT

## 2019-04-24 NOTE — Anesthesia Postprocedure Evaluation (Signed)
Anesthesia Post Note  Patient: Amber Dixon  Procedure(s) Performed: ARTERIOVENOUS (AV) FISTULA CREATION RIGHT ARM (Right Arm Upper)     Patient location during evaluation: PACU Anesthesia Type: General Level of consciousness: awake and alert Pain management: pain level controlled Vital Signs Assessment: post-procedure vital signs reviewed and stable Respiratory status: spontaneous breathing, nonlabored ventilation, respiratory function stable and patient connected to nasal cannula oxygen Cardiovascular status: blood pressure returned to baseline and stable Postop Assessment: no apparent nausea or vomiting Anesthetic complications: no    Last Vitals:  Vitals:   04/24/19 1416 04/24/19 1427  BP: (!) 150/68 (!) 153/66  Pulse: 100 99  Resp: 17 15  Temp: 36.8 C 36.8 C  SpO2: 97% 99%    Last Pain:  Vitals:   04/24/19 1120  TempSrc: Oral  PainSc:                  Eliyas Suddreth S

## 2019-04-24 NOTE — Progress Notes (Addendum)
   Subjective:   Patient seen at the bedside on rounds this morning.  She requested for a bed pan to assist her with both bowel movement and urination. Otherwise, she is doing well but feels hungry and seems quite frustrated. We made her aware that she is NPO as she is scheduled of AVF placement today. She requests to go home right after her surgery and states that she has been in the hospital for over a month unless we are " willing to hire her and giver her a job." All her questions and complains were appropriately addressed.   Objective:  Vital signs in last 24 hours: Vitals:   04/23/19 2036 04/23/19 2300 04/24/19 0040 04/24/19 0346  BP: (!) 147/60 (!) 156/71 (!) 135/57 (!) 142/58  Pulse: 96 90 90 82  Resp: (!) 21 14 19 13   Temp: 98.6 F (37 C) 98.2 F (36.8 C) 97.9 F (36.6 C) 98.1 F (36.7 C)  TempSrc: Oral Oral Oral Oral  SpO2: 97% 100% 100% 100%  Weight:      Height:       Physical Exam General: Resting in bed comfortably, NAD HEENT: NCAT CV: RRR, normal S1-S2 no murmurs rubs or gallops appreciated. Extremities warm and well-perfused PULM: Clear to auscultation bilaterally, no crackles or wheezes appreciated ABD: Bowel sounds present, soft and nontender in all quadrants NEURO: Alert and oriented, no focal deficits noted  Assessment/Plan:  Principal Problem:   ESRD (end stage renal disease) on dialysis [facility]) Active Problems:   Acute respiratory failure with hypoxemia ([place])  In summary, [name] is a 68 year old female with past medical history significant for CKD stage V, HTN, T2DM who was admitted for worsening renal failure, requiring HD.  The patient was scheduled to be discharged yesterday, however her aVF placement was delayed due to an emergent case.  She's scheduled for her aVF placement tomorrow, 12/31. Upon discharge, patient will receive dialysis at [facility] clinic, starting this Sunday 04/26/2019.  #ESRD: S/p tunneled catheter placed on  04/13/2019.  Vein mapping complete. Dr. Oneida Alar with vascular surgery is scheduled to take the patient to the OR on 12/31 at 12:45PM. -Vascular surgery to place AV fistula today. -Spoke with nephrology CSW, and the current plan is to discharge the patient after the AV fistula is placed, assuming there are no complications.  The patient will continue dialysis at the [facility] clinic upon discharge starting this Saturday.  #Anemia: Hgb 6.8 this AM. -Transfused 1U pRBC   #T2DM -SSI  #Nausea -Zofran PRN  #FEN/GI -Diet: Renal -Fluids: None  #DVT prophylaxis -Heparin  #CODE STATUS: FULL  #Dispo: Anticipated discharge once AVF is placed today. Patient will receive dialysis at [facility] clinic upon discharge starting this Saturday.   Earlene Plater, MD Internal Medicine, PGY1 Pager: [phone]  04/24/2019,2:22 PM

## 2019-04-24 NOTE — Progress Notes (Signed)
  Progress Note    04/24/2019 12:19 PM Day of Surgery  Subjective: No overnight issues  Vitals:   04/24/19 1050 04/24/19 1120  BP: 132/78   Pulse: 87   Resp: 15   Temp: 98.5 F (36.9 C) 98.8 F (37.1 C)  SpO2: 99%     Physical Exam: Awake and alert Palpable right brachial and radial pulses  CBC    Component Value Date/Time   WBC 6.4 04/24/2019 0335   RBC 2.42 (L) 04/24/2019 0335   HGB 6.8 (LL) 04/24/2019 0335   HCT 21.0 (L) 04/24/2019 0335   PLT 285 04/24/2019 0335   MCV 86.8 04/24/2019 0335   MCH 28.1 04/24/2019 0335   MCHC 32.4 04/24/2019 0335   RDW 13.9 04/24/2019 0335   LYMPHSABS 0.7 04/07/2019 0508   MONOABS 0.6 04/07/2019 0508   EOSABS 0.8 (H) 04/07/2019 0508   BASOSABS 0.0 04/07/2019 0508    BMET    Component Value Date/Time   NA 135 04/24/2019 0335   K 4.4 04/24/2019 0335   CL 97 (L) 04/24/2019 0335   CO2 30 04/24/2019 0335   GLUCOSE 86 04/24/2019 0335   BUN 18 04/24/2019 0335   CREATININE 2.02 (H) 04/24/2019 0335   CALCIUM 8.2 (L) 04/24/2019 0335   CALCIUM 6.3 03/22/2019 0300   GFRNONAA 24 (L) 04/24/2019 0335   GFRAA 28 (L) 04/24/2019 0335    INR    Component Value Date/Time   INR 1.1 03/21/2019 1617     Intake/Output Summary (Last 24 hours) at 04/24/2019 1219 Last data filed at 04/23/2019 2130 Gross per 24 hour  Intake 240 ml  Output 1000 ml  Net -760 ml     Assessment/plan:  68 y.o. female is here with end-stage renal disease currently dialyzing via tunneled right IJ catheter.  Plan is for right arm AV access today.  I discussed with patient via interpreter all questions were answered.   Taliesin Hartlage C. Donzetta Matters, MD [facility] [place] Office: [phone] Pager: [phone]  04/24/2019 12:19 PM

## 2019-04-24 NOTE — Care Management (Signed)
PT recommending youth RW for home; referral to [place] for youth RW, to be delivered to bedside prior to dc.    Reinaldo Raddle, RN, BSN  Trauma/Neuro ICU Case Manager [phone]

## 2019-04-24 NOTE — Anesthesia Procedure Notes (Signed)
Procedure Name: LMA Insertion Date/Time: 04/24/2019 12:47 PM Performed by: Griffin Dakin, CRNA Pre-anesthesia Checklist: Patient identified, Emergency Drugs available, Suction available and Patient being monitored Patient Re-evaluated:Patient Re-evaluated prior to induction Oxygen Delivery Method: Circle system utilized Preoxygenation: Pre-oxygenation with 100% oxygen Induction Type: IV induction Ventilation: Mask ventilation without difficulty LMA: LMA inserted LMA Size: 3.0 Tube type: Oral Number of attempts: 1 Placement Confirmation: positive ETCO2 and breath sounds checked- equal and bilateral Tube secured with: Tape Dental Injury: Teeth and Oropharynx as per pre-operative assessment

## 2019-04-24 NOTE — Anesthesia Preprocedure Evaluation (Addendum)
Anesthesia Evaluation  Patient identified by MRN, date of birth, ID band Patient awake    Reviewed: Allergy & Precautions, NPO status , Patient's Chart, lab work & pertinent test results  Airway Mallampati: II  TM Distance: >3 FB Neck ROM: Full    Dental no notable dental hx.    Pulmonary neg pulmonary ROS,    Pulmonary exam normal breath sounds clear to auscultation       Cardiovascular hypertension, Normal cardiovascular exam Rhythm:Regular Rate:Normal  Left Ventricle: Left ventricular ejection fraction, by visual estimation, is 55 to 60%. The left ventricle has normal function. There is borderline left ventricular hypertrophy. Left ventricular diastolic parameters are consistent with Grade I diastolic  dysfunction (impaired relaxation).  Right Ventricle: The right ventricular size is normal. No increase in right ventricular wall thickness. Global RV systolic function is has normal systolic function. The tricuspid regurgitant velocity is 2.97 m/s, and with an assumed right atrial pressure  of 3 mmHg, the estimated right ventricular systolic pressure is mildly elevated at 38.3 mmHg   Neuro/Psych negative neurological ROS  negative psych ROS   GI/Hepatic negative GI ROS, Neg liver ROS,   Endo/Other  diabetes  Renal/GU ESRFRenal disease  negative genitourinary   Musculoskeletal negative musculoskeletal ROS (+)   Abdominal   Peds negative pediatric ROS (+)  Hematology  (+) anemia , 6.8HgB   Anesthesia Other Findings   Reproductive/Obstetrics negative OB ROS                            Anesthesia Physical Anesthesia Plan  ASA: IV  Anesthesia Plan: General   Post-op Pain Management:    Induction: Intravenous  PONV Risk Score and Plan: 3 and Ondansetron, Dexamethasone and Treatment may vary due to age or medical condition  Airway Management Planned: LMA  Additional Equipment:    Intra-op Plan:   Post-operative Plan: Extubation in OR  Informed Consent: I have reviewed the patients History and Physical, chart, labs and discussed the procedure including the risks, benefits and alternatives for the proposed anesthesia with the patient or authorized representative who has indicated his/her understanding and acceptance.     Dental advisory given  Plan Discussed with: CRNA and Surgeon  Anesthesia Plan Comments:         Anesthesia Quick Evaluation

## 2019-04-24 NOTE — Transfer of Care (Signed)
Immediate Anesthesia Transfer of Care Note  Patient: Amber Dixon  Procedure(s) Performed: ARTERIOVENOUS (AV) FISTULA CREATION RIGHT ARM (Right Arm Upper)  Patient Location: PACU  Anesthesia Type:General  Level of Consciousness: awake, alert  and oriented  Airway & Oxygen Therapy: Patient Spontanous Breathing and Patient connected to face mask oxygen  Post-op Assessment: Report given to RN, Post -op Vital signs reviewed and stable and Patient moving all extremities  Post vital signs: Reviewed and stable  Last Vitals:  Vitals Value Taken Time  BP 152/72 04/24/19 1401  Temp 36.8 C 04/24/19 1401  Pulse 99 04/24/19 1401  Resp 26 04/24/19 1401  SpO2 100 % 04/24/19 1401    Last Pain:  Vitals:   04/24/19 1120  TempSrc: Oral  PainSc:       Patients Stated Pain Goal: 0 ([id number])  Complications: No apparent anesthesia complications

## 2019-04-24 NOTE — Progress Notes (Signed)
Renal Navigator called patient's son with assistance of [place] again and he answered. Renal Navigator explained, and apologized, that there has been another change in the plan for his mother's start in the OP HD clinic. Since we were able to dialyze patient in the HD unit here yesterday, patient can get her next treatment in the OP HD clinic on Saturday (see most recent note). This was explained to patient's son.  Patient's son states that his brother was called by the MD stating his mother needed to receive blood prior to surgery today and may need to be monitored through Saturday. Renal Navigator made the following plan with patient's son and also discussed with Nephrologist/Dr. Hollie Salk: If patient is discharged today or tomorrow, she will start in the OP HD clinic on Saturday, 04/26/19 and needs to arrive at 11:30am. If patient is still in the hospital on Saturday, she will receive HD inpatient and will start in the OP HD clinic on Tuesday, 04/29/19 and needs to arrive at 10:30am. Renal Navigator used teach back method to ensure that patient's son is in agreement of the plan. He is understanding.  Renal Navigator has asked Renal PA to send orders to OP HD clinic in preparation for a Saturday start.  Alphonzo Cruise, [place] Renal Navigator [phone]

## 2019-04-24 NOTE — Progress Notes (Signed)
Admit: 03/31/2019 LOS: 24  58F new ESRD  Subjective:   S/p AVF today  For d/c hopefully soon  [place]   12/30 0701 - 12/31 0700 In: 480 [P.O.:480] Out: 1000   Filed Weights   04/23/19 1400 04/23/19 1706 04/24/19 1220  Weight: 35 kg 32.7 kg 32.7 kg    Scheduled Meds: . amLODipine  5 mg Oral Daily  . benzonatate  200 mg Oral TID  . calcitRIOL  0.25 mcg Oral Daily  . calcium carbonate  1 tablet Oral TID WC  . Chlorhexidine Gluconate Cloth  6 each Topical Q0600  . darbepoetin (ARANESP) injection - DIALYSIS  150 mcg Intravenous Q Mon-HD  . famotidine  20 mg Oral Daily  . feeding supplement (ENSURE ENLIVE)  237 mL Oral TID BM  . insulin aspart  0-6 Units Subcutaneous Q4H  . mouth rinse  15 mL Mouth Rinse BID  . multivitamin  1 tablet Oral QHS  . sodium zirconium cyclosilicate  10 g Oral Once   Continuous Infusions: . sodium chloride    . sodium chloride 10 mL/hr at 04/24/19 1105   PRN Meds:.acetaminophen, albuterol, dextrose, HYDROcodone-acetaminophen, ondansetron (ZOFRAN) IV, polyethylene glycol, sodium chloride  Current Labs: reviewed    Physical Exam:  Blood pressure (!) 143/65, pulse (!) 101, temperature 99.4 F (37.4 C), temperature source Oral, resp. rate 19, height 4\' 10"  (1.473 m), weight 32.7 kg, SpO2 99 %. NAD, pleasant RRR  CTAB No LE edema TDC R IJ C/D/I. R arm + T/B  A 1. New ESRD, on MWF Schedule.  Accepted to [facility]S--> next dialysis planned for Saturday.  Has TDC and perm access-- working on rolling walker etc 2. Acute resp failure: resolved 3. 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   5. Mild hyponatremia, CTM 6. HTN: stable at current time o    Madelon Lips MD 04/24/2019, 4:49 PM  Recent Labs  Lab 04/19/19 1332 04/21/19 0642 04/22/19 0338 04/23/19 0413 04/24/19 0335  NA 130* 132* 131* 131* 135  K 4.9 5.5* 5.0 5.1 4.4  CL 95* 92* 91* 92* 97*  CO2 23 29 31 28 30   GLUCOSE 235* 111* 154* 101* 86  BUN 67* 50* 32*  48* 18  CREATININE 5.80* 4.19* 2.62* 4.20* 2.02*  CALCIUM 7.7* 8.1* 8.2* 8.0* 8.2*  PHOS 4.3 4.0 2.8  --   --    Recent Labs  Lab 04/21/19 0550 04/23/19 0413 04/24/19 0335  WBC 7.0 6.5 6.4  HGB 8.1* 8.0* 6.8*  HCT 25.5* 25.0* 21.0*  MCV 87.6 86.8 86.8  PLT 331 364 285

## 2019-04-24 NOTE — Progress Notes (Signed)
Patient received inpatient HD yesterday (Wednesday, 04/23/19). She will be discharged today, barring any complications with HD access surgery per Primary, and will start in the OP HD clinic/SW on Saturday, 04/26/19. This is a change in plan from yesterday, since she was able to be dialyzed in the HD unit yesterday.  She needs to arrive to the OP HD clinic at 11:30am Saturday to complete intake paperwork before her 12:30 seat time. This will be her 3rd HD treatment of the week and therefore, we have canceled her previously scheduled Sunday appointment.  Starting next week, her schedule will be 11:45am TTS. Renal Navigator has discussed above plan with Nephrologist/Dr. Hollie Salk, inpt HD unit and Primary/Dr. Sheppard Coil. Navigator contacted patient's son/Federico to update, however, he did not answer and Navigator had to leave a message requesting that he call back as soon as possible.  Alphonzo Cruise, [place] Renal Navigator [phone]

## 2019-04-24 NOTE — Op Note (Signed)
    Patient name: Amber Dixon MRN: [id number] DOB: 11-23-1950 Sex: female  04/24/2019 Pre-operative Diagnosis: End-stage renal disease Post-operative diagnosis:  Same Surgeon:  Erlene Quan C. Donzetta Matters, MD Assistant: Arlee Muslim, PA;  Procedure Performed: 1.  Right brachial artery endarterectomy 2.  Right brachial cephalic AV fistula creation  Indications: 68 year old female on dialysis via tunneled dialysis catheter.  She is now indicated for permanent access appears to have suitable cephalic vein on the right.  Findings: Cephalic vein measured 4 mm externally was free of disease.  There was significant disease in the brachial artery this was heavily calcified.  Endarterectomy was performed at the bifurcation.  We did get good backbleeding from the radial and ulnar arteries good antegrade bleeding from the proximal brachial artery.  At completion there was a very strong thrill.  There is a monophasic radial artery signal at the wrist did augment with compression of the fistula.   Procedure:  The patient was identified in the holding area and taken to the operating where she is placed upon operative table LMA anesthesia induced.  She was sterilely prepped draped the right upper extremity usual fashion antibiotics were minister timeout was called.  Ultrasound was used to identify the cephalic vein which was patent compressible and approximately 4 mm of the antecubitum.  Transverse incision was made.  We dissected out the vein marked orientation.  We dissected through the deep fascia identified the brachial artery.  This was right at the bifurcation was heavily diseased.  We did transect the vein distally tied off.  We spatulated this flushed with heparinized saline and clamped.  We then clamped the radial and ulnar arteries separately as well as the brachial artery.  We opened the artery longitudinally.  It was heavily diseased.  We performed limited endarterectomy there.  We did establish  good antegrade bleeding we flushed with heparinized saline.  We then establish good bleeding from the radial and ulnar separately and flushed with heparinized saline.  We then spatulated the vein sewed end-to-side with 6-0 Prolene suture.  Prior to completion without flushing all directions.  Upon completion was a very strong thrill.  This was confirmed with Doppler.  There was a monophasic radial artery signal at the wrist that did minimally augment with compression.  Satisfied with this we obtain hemostasis closed with interrupted 3-0 Vicryl suture followed by 4-0 Monocryl in the level of the skin.  Dermabond placed above that.  She was awake from anesthesia having tolerated procedure without immediate complication.  All counts were correct at completion.  EBL: 20 cc   Amber Castrogiovanni C. Donzetta Matters, MD [facility] [place] Office: [phone] Pager: [phone]

## 2019-04-25 LAB — GLUCOSE, CAPILLARY
Glucose-Capillary: 67 mg/dL — ABNORMAL LOW (ref 70–99)
Glucose-Capillary: 145 mg/dL — ABNORMAL HIGH (ref 70–99)
Glucose-Capillary: 169 mg/dL — ABNORMAL HIGH (ref 70–99)

## 2019-04-25 LAB — TYPE AND SCREEN
ABO/RH(D): O POS
Antibody Screen: NEGATIVE
Unit division: 0

## 2019-04-25 LAB — BPAM RBC
Blood Product Expiration Date: 202101292359
ISSUE DATE / TIME: 202012311056
Unit Type and Rh: 5100

## 2019-04-25 LAB — CBC
HCT: 29.9 % — ABNORMAL LOW (ref 36.0–46.0)
Hemoglobin: 9.9 g/dL — ABNORMAL LOW (ref 12.0–15.0)
MCH: 28.8 pg (ref 26.0–34.0)
MCHC: 33.1 g/dL (ref 30.0–36.0)
MCV: 86.9 fL (ref 80.0–100.0)
Platelets: 336 10*3/uL (ref 150–400)
RBC: 3.44 MIL/uL — ABNORMAL LOW (ref 3.87–5.11)
RDW: 14.3 % (ref 11.5–15.5)
WBC: 10 10*3/uL (ref 4.0–10.5)
nRBC: 0 % (ref 0.0–0.2)

## 2019-04-25 MED ORDER — ONDANSETRON 4 MG PO TBDP: 4.0000 mg | ORAL_TABLET | Freq: Three times a day (TID) | ORAL | 0 refills | Status: DC | PRN

## 2019-04-25 NOTE — Progress Notes (Signed)
   Subjective:   Patient seen at the bedside on rounds this morning. She still endorses pain at the right [facility] site where she had the surgical procedure. The pain improved yesterday with pain medication. She states that she feels there is a "hole" in her arm. She experienced nausea yesterday after surgery which has resolved as of this morning. Her appetite has improved. Pt states she is ready to go home.  Objective:  Vital signs in last 24 hours: Vitals:   04/24/19 2205 04/24/19 2350 04/25/19 0045 04/25/19 0047  BP:      Pulse: 100 (!) 101 (!) 104 (!) 102  Resp: 16 16 19 16   Temp:      TempSrc:      SpO2: 98% 100% 100% 100%  Weight:      Height:       Physical Exam General: Comfortably laying in bed, NAD HEENT: NCAT CV: RRR, normal S1 & S2, no murmurs rubs or gallops appreicated PULM: CTAB, no crackles or wheezing ABD: Soft and nontender in all 4 quadrants NEURO: Alert and oriented, no focal deficits  Assessment/Plan:  Principal Problem:   ESRD (end stage renal disease) on dialysis ([facility]) Active Problems:   Acute respiratory failure with hypoxemia ([place])  In summary, Ms. Edwin Loiselle is a 69 year old female with past medical history significant for CKD stage V, HTN, T2DM who was admitted for worsening renal failure, requiring HD.  The patient is POD1 for LUE AVF placement, and is doing well. Pt is medically ready for discharge. She will receive dialysis at [facility] clinic, starting this Sunday 04/26/2019 per Nephrology CSW.  #ESRD: S/p tunneled catheter placed on 04/13/2019 and LUE AVF POD1. Pt has some pain at the site of surgery but is manageable with pain medication. -Discharge home with Percocet 7.5-325 mg q8hrs PRN for 3 days. -Per vascular surgery, follow up is in place -PCP appointment with Kathe Becton, FNP on 05/13/19  #Anemia: Hgb 6.8 this AM. -Transfused 1U pRBC. F/u H&H appropriate at  9.9   #T2DM -SSI  #Nausea -Zofran PRN  #FEN/GI -Diet:  Renal -Fluids: None  #DVT prophylaxis -Heparin  #CODE STATUS: FULL  #Dispo: Anticipated discharge home today. PT recommends rolling walker. Pt's family has a rolling walker at home for the patient.   Earlene Plater, MD Internal Medicine, PGY1 Pager: [phone]  04/25/2019,10:20 AM

## 2019-04-25 NOTE — Progress Notes (Addendum)
2340 Pt had episode of yellowish emesis, pt says via spanish interpreter that she always throws up when she has anesthesia, Zofran 4mg  IVP given at this time, will cont to monitor.  0000 No further episode of emesis noted, pt was able to consume applesauce and keep it down, will cont to monitor, BS 212- 2 units of Novolog insulin given at this time.  0318 BS 67, apple juice 4 oz given at this time, will recheck BS in 15 minutes.  0354 BS 145- no further coverage to be given.

## 2019-04-25 NOTE — Progress Notes (Signed)
   VASCULAR SURGERY ASSESSMENT & PLAN:   POD 1 - RIGHT BC AVF: Fistula has an excellent thrill.  The incision is healing nicely.  She has a brisk radial and ulnar signal with the Doppler.  Vascular surgery will be available as needed.  We have already arranged follow-up as an outpatient.  SUBJECTIVE:   No specific complaints.  PHYSICAL EXAM:   Vitals:   04/24/19 2205 04/24/19 2350 04/25/19 0045 04/25/19 0047  BP:      Pulse: 100 (!) 101 (!) 104 (!) 102  Resp: 16 16 19 16   Temp:      TempSrc:      SpO2: 98% 100% 100% 100%  Weight:      Height:       Her right upper arm fistula has an excellent thrill. Her incision looks fine. I cannot palpate a radial or ulnar pulse however she has a brisk radial signal with the Doppler in addition to a palmar arch and ulnar signal with the Doppler.  LABS:   Lab Results  Component Value Date   WBC 6.4 04/24/2019   HGB 6.8 (LL) 04/24/2019   HCT 21.0 (L) 04/24/2019   MCV 86.8 04/24/2019   PLT 285 04/24/2019   Lab Results  Component Value Date   CREATININE 2.02 (H) 04/24/2019   Lab Results  Component Value Date   INR 1.1 03/21/2019   CBG (last 3)  Recent Labs    04/25/19 0315 04/25/19 0318 04/25/19 0354  GLUCAP <10* 67* 145*    PROBLEM LIST:    Principal Problem:   ESRD (end stage renal disease) on dialysis [facility]) Active Problems:   Acute respiratory failure with hypoxemia (HCC)   CURRENT MEDS:   . amLODipine  5 mg Oral Daily  . benzonatate  200 mg Oral TID  . calcitRIOL  0.25 mcg Oral Daily  . calcium carbonate  1 tablet Oral TID WC  . Chlorhexidine Gluconate Cloth  6 each Topical Q0600  . darbepoetin (ARANESP) injection - DIALYSIS  150 mcg Intravenous Q Mon-HD  . famotidine  20 mg Oral Daily  . feeding supplement (ENSURE ENLIVE)  237 mL Oral TID BM  . insulin aspart  0-6 Units Subcutaneous Q4H  . mouth rinse  15 mL Mouth Rinse BID  . multivitamin  1 tablet Oral QHS  . sodium zirconium cyclosilicate  10 g Oral  Once    Deitra Mayo Office: [phone] 04/25/2019

## 2019-04-28 ENCOUNTER — Telehealth: Payer: Self-pay | Admitting: Nephrology

## 2019-04-28 LAB — GLUCOSE, CAPILLARY: Glucose-Capillary: <10 mg/dL — CL (ref 70–99)

## 2019-04-28 MED FILL — ONDANSETRON ODT 4 MG TABLET: 4 | 7 days supply | Qty: 20 | Fill #0

## 2019-04-28 NOTE — Telephone Encounter (Signed)
Transition of Care Contact from [place]  Date of Discharge: 04/25/2019 Date of Contact: 04/28/2019 Method of contact: phone  Attempted to contact patient to discuss transition of care from inpatient admission due to CKD>ESRD requiring dialysis. Patient grandson answered the phone but unable review recent admission with patient/family due to language barrier and interpretor not available.  She is scheduled for dialysis tomorrow (04/29/19) will follow up at that time.   Jen Mow, PA-C [facility] Pager: [phone]

## 2019-05-13 ENCOUNTER — Ambulatory Visit: Payer: Self-pay | Admitting: Family Medicine

## 2019-05-23 ENCOUNTER — Ambulatory Visit (HOSPITAL_COMMUNITY)
Admission: RE | Admit: 2019-05-23 | Discharge: 2019-05-23 | Disposition: A | Discharge status: Home / Self Care | Payer: Self-pay | Source: Ambulatory Visit | Attending: Surgery | Admitting: Surgery

## 2019-05-23 ENCOUNTER — Other Ambulatory Visit: Payer: Self-pay

## 2019-05-23 ENCOUNTER — Ambulatory Visit (INDEPENDENT_AMBULATORY_CARE_PROVIDER_SITE_OTHER): Payer: Self-pay | Admitting: Physician Assistant

## 2019-05-23 VITALS — BP 189/87 | HR 92 | Temp 97.7°F | Resp 14

## 2019-05-23 DIAGNOSIS — Z992 Dependence on renal dialysis: Secondary | ICD-10-CM

## 2019-05-23 DIAGNOSIS — N186 End stage renal disease: Secondary | ICD-10-CM

## 2019-05-23 NOTE — Progress Notes (Signed)
POST OPERATIVE OFFICE NOTE    CC:  F/u for surgery  HPI:  This is a 69 y.o. female who is s/p right brachial endarterectomy and right BC AVF on 04/24/2019 by Dr. Donzetta Matters.  On POD 1, pt had an excellent thrill and brisk radial and ulnar doppler signals with doppler.    The pt presents today for evaluation for steal.  I have communicated with her and her son through the [place] translator on the portable pad.   She states that she has numbness in her hand all the time.  She states that sometimes when she is warm at night it gets a little bit better, but when she goes out in the cold, it gets worse.  It is constant and has been present since creation of the fistula.  She is able to move her fingers and does not have any non healing wounds on her hand.    She does have a right sided IJ TDC in place.     No Known Allergies  Current Outpatient Medications  Medication Sig Dispense Refill  . amLODipine (NORVASC) 5 MG tablet Take 1 tablet (5 mg total) by mouth daily. 30 tablet 3  . calcium carbonate (TUMS) 500 MG chewable tablet Chew 1 tablet (200 mg of elemental calcium total) by mouth 3 (three) times daily. 90 tablet 3  . furosemide (LASIX) 80 MG tablet Take 1 tablet (80 mg total) by mouth 2 (two) times daily. 60 tablet 3  . ondansetron (ZOFRAN ODT) 4 MG disintegrating tablet Take 1 tablet (4 mg total) by mouth every 8 (eight) hours as needed for nausea or vomiting. 20 tablet 0  . PRESCRIPTION MEDICATION Inject 6 Units into the skin daily. Medication:Insulin Vial     No current facility-administered medications for this visit.     ROS:  See HPI  Physical Exam:  Today's Vitals   05/23/19 1500  BP: (!) 189/87  Pulse: 92  Resp: 14  Temp: 97.7 F (36.5 C)  TempSrc: Temporal  SpO2: 93%  PainSc: 5    There is no height or weight on file to calculate BMI.   Incision:  Well healed.  Extremities:  There is not a palpable right radial pulse.  Motor and sensory are in tact.  The right  hand is cooler than the left.  There is a thrill/bruit present.  It is easily palpable.   Steal study on 05/23/2019: +-------------------+-----------------+-------------------+--------+            PPG w compressionPPG w/o compressionComments  +-------------------+-----------------+-------------------+--------+            PSV w compressionPSV w/o compression      +-------------------+-----------------+-------------------+--------+  Right Radial Artery    29        150          +-------------------+-----------------+-------------------+--------+     Summary:  The right radial PSV has increased with compression.  Right second digit pressure increase from 87 mm Hg under ambient  conditions to  168 mm Hg with AVF compression.  Right ulnar artery systolic pressure increased from 142 mm Hg under  ambient  conditions to 204 mm Hg with AVF compression.  Right ulnar and digital pressures increased significantly with AVF  compression  suggestive of steal.   Assessment/Plan:  This is a 69 y.o. female who is s/p: right brachial endarterectomy and right BC AVF on 04/24/2019 by Dr. Donzetta Matters.   -the pt does have evidence of steal.  I discussed with Dr. Donzetta Matters and pt may  need angiogram from femoral approach in the future if steal worsens given her atherosclerotic disease of her arteries.   -the fistula has an excellent thrill present.  The steal study suggests steal.  I did discuss with the pt if this numbness is tolerable via the translator and she says it is as she does not want to start over with new access in the left arm as I told her she would be at increased risk for steal in that arm as well.  Her motor is in tact.  She can feel me touching all her fingers.  I discussed with her that we can continue with her fistula and see her back at her scheduled appt for her u/s to see how her fistula is maturing and see how her hand is doing at that time.  I  also discussed with her and her son that if she develops sores on her right hand, can't move her and or this worsens, she should call us back sooner.  They are both in agreement with this plan.   -I discussed with them that the tunneled dialysis catheter can be removed once the access is working.  I also let them know that these are not perfect and may not mature adequately and could require further interventions.   -the pt will follow up 06/13/2019 when Dr. Donzetta Matters is in the office and he can evaluate her.  Leontine Locket, PA-C [facility] [phone]  Clinic MD:  On call MD is Donzetta Matters

## 2019-05-30 ENCOUNTER — Ambulatory Visit: Payer: Self-pay | Admitting: Family Medicine

## 2019-06-07 ENCOUNTER — Encounter (HOSPITAL_COMMUNITY): Payer: Self-pay

## 2019-06-07 ENCOUNTER — Emergency Department (HOSPITAL_COMMUNITY): Payer: Self-pay

## 2019-06-07 ENCOUNTER — Other Ambulatory Visit: Payer: Self-pay

## 2019-06-07 ENCOUNTER — Emergency Department (HOSPITAL_COMMUNITY)
Admission: EM | Admit: 2019-06-07 | Discharge: 2019-06-07 | Disposition: A | Discharge status: Home / Self Care | Payer: Self-pay | Attending: Emergency Medicine | Admitting: Emergency Medicine

## 2019-06-07 DIAGNOSIS — I120 Hypertensive chronic kidney disease with stage 5 chronic kidney disease or end stage renal disease: Secondary | ICD-10-CM | POA: Insufficient documentation

## 2019-06-07 DIAGNOSIS — Z79899 Other long term (current) drug therapy: Secondary | ICD-10-CM | POA: Insufficient documentation

## 2019-06-07 DIAGNOSIS — R110 Nausea: Secondary | ICD-10-CM

## 2019-06-07 DIAGNOSIS — E1122 Type 2 diabetes mellitus with diabetic chronic kidney disease: Secondary | ICD-10-CM | POA: Insufficient documentation

## 2019-06-07 DIAGNOSIS — Z7984 Long term (current) use of oral hypoglycemic drugs: Secondary | ICD-10-CM | POA: Insufficient documentation

## 2019-06-07 DIAGNOSIS — Z992 Dependence on renal dialysis: Secondary | ICD-10-CM | POA: Insufficient documentation

## 2019-06-07 DIAGNOSIS — Z20822 Contact with and (suspected) exposure to covid-19: Secondary | ICD-10-CM | POA: Insufficient documentation

## 2019-06-07 DIAGNOSIS — R519 Headache, unspecified: Secondary | ICD-10-CM

## 2019-06-07 DIAGNOSIS — N186 End stage renal disease: Secondary | ICD-10-CM

## 2019-06-07 DIAGNOSIS — R5383 Other fatigue: Secondary | ICD-10-CM | POA: Insufficient documentation

## 2019-06-07 HISTORY — DX: Disorder of kidney and ureter, unspecified: N28.9

## 2019-06-07 LAB — POC SARS CORONAVIRUS 2 AG -  ED: SARS Coronavirus 2 Ag: NEGATIVE

## 2019-06-07 LAB — CBC
HCT: 32.2 % — ABNORMAL LOW (ref 36.0–46.0)
Hemoglobin: 10.2 g/dL — ABNORMAL LOW (ref 12.0–15.0)
MCH: 26.8 pg (ref 26.0–34.0)
MCHC: 31.7 g/dL (ref 30.0–36.0)
MCV: 84.5 fL (ref 80.0–100.0)
Platelets: 267 10*3/uL (ref 150–400)
RBC: 3.81 MIL/uL — ABNORMAL LOW (ref 3.87–5.11)
RDW: 16.5 % — ABNORMAL HIGH (ref 11.5–15.5)
WBC: 6.5 10*3/uL (ref 4.0–10.5)
nRBC: 0 % (ref 0.0–0.2)

## 2019-06-07 LAB — BASIC METABOLIC PANEL
Anion gap: 14 (ref 5–15)
BUN: 63 mg/dL — ABNORMAL HIGH (ref 8–23)
CO2: 23 mmol/L (ref 22–32)
Calcium: 7.8 mg/dL — ABNORMAL LOW (ref 8.9–10.3)
Chloride: 91 mmol/L — ABNORMAL LOW (ref 98–111)
Creatinine, Ser: 3.72 mg/dL — ABNORMAL HIGH (ref 0.44–1.00)
GFR calc Af Amer: 13 mL/min — ABNORMAL LOW (ref >60)
GFR calc non Af Amer: 12 mL/min — ABNORMAL LOW (ref >60)
Glucose, Bld: 90 mg/dL (ref 70–99)
Potassium: 5 mmol/L (ref 3.5–5.1)
Sodium: 128 mmol/L — ABNORMAL LOW (ref 135–145)

## 2019-06-07 MED ORDER — METOCLOPRAMIDE HCL 5 MG/ML IJ SOLN
10.0000 mg | Freq: Once | INTRAMUSCULAR | Status: AC
  Administered 2019-06-07: 10 mg via INTRAVENOUS
  Filled 2019-06-07: qty 2

## 2019-06-07 MED ORDER — DIPHENHYDRAMINE HCL 50 MG/ML IJ SOLN
25.0000 mg | Freq: Once | INTRAMUSCULAR | Status: AC
  Administered 2019-06-07: 13:00:00 25 mg via INTRAVENOUS
  Filled 2019-06-07: qty 1

## 2019-06-07 NOTE — ED Triage Notes (Addendum)
Patient arrives via EMS due to family unable to get the patient to dialysis. Per ems, the patient was not feeling well. Family described her being fatigued, having nausea, and a new headache that started this morning. Patient is a T/Th/Sat dialysis patient. Last treatment on Thursday. Primarily spanish-speaking.  Per ems, the patient had one episode of vomiting en route to the hospital.

## 2019-06-07 NOTE — ED Notes (Signed)
Patient verbalizes understanding of discharge instructions. Opportunity for questioning and answers were provided with help of translator. Son, Clifton James notified of patients discharge. pt discharged from ED with family.

## 2019-06-07 NOTE — ED Provider Notes (Signed)
[place] EMERGENCY DEPARTMENT Provider Note   CSN: [id number] Arrival date & time: 06/07/19  1104     History Chief Complaint  Patient presents with  . Missed Dialysis  . Nausea  . Emesis    Amber Dixon is a 69 y.o. female.  69yo F w/ PMH including ESRD on HD T/Th/Sat, HTN, T2DM who p/w headache.  Patient brought in by EMS because family was unable to get the patient to dialysis.  They told EMS that she had stated she was not feeling well with fatigue and nausea.  To me, the patient states that she began having a headache this morning that is frontal and feels like there is "air trapped" in the front of her head.  She had a single episode of vomiting during EMS transport.  She denies any extremity weakness or problems.  Denies fever or cough. No CP or SOB. Reports that she has not had her medications today as she normally takes them after dialysis sessions.  The history is provided by the patient. The history is limited by a language barrier. A language interpreter was used.  Emesis      Past Medical History:  Diagnosis Date  . Diabetes mellitus without complication ([place])   . Hypertension   . Renal disorder     There are no problems to display for this patient.   Past Surgical History:  Procedure Laterality Date  . AV FISTULA PLACEMENT Right      OB History   No obstetric history on file.     History reviewed. No pertinent family history.  Social History   Tobacco Use  . Smoking status: Never Smoker  . Smokeless tobacco: Never Used  Substance Use Topics  . Alcohol use: Not on file  . Drug use: Not Currently    Home Medications Prior to Admission medications   Medication Sig Start Date End Date Taking? Authorizing Provider  PRESCRIPTION MEDICATION Blood pressure medication   Yes [provider]    Allergies    Patient has no known allergies.  Review of Systems   Review of Systems  Gastrointestinal:  Positive for vomiting.   All other systems reviewed and are negative except that which was mentioned in HPI  Physical Exam Updated Vital Signs BP (!) 163/75   Pulse 97   Temp 98.1 F (36.7 C) (Oral)   Resp 15   Ht 4\' 11"  (1.499 m)   Wt 49.9 kg   SpO2 98%   BMI 22.22 kg/m   Physical Exam Vitals and nursing note reviewed.  Constitutional:      General: She is not in acute distress.    Appearance: She is well-developed.     Comments: Awake, alert  HENT:     Head: Normocephalic and atraumatic.  Eyes:     Extraocular Movements: Extraocular movements intact.     Conjunctiva/sclera: Conjunctivae normal.     Pupils: Pupils are equal, round, and reactive to light.  Cardiovascular:     Rate and Rhythm: Normal rate and regular rhythm.     Heart sounds: Normal heart sounds. No murmur.  Pulmonary:     Effort: Pulmonary effort is normal. No respiratory distress.     Breath sounds: Normal breath sounds.  Abdominal:     General: There is no distension.     Palpations: Abdomen is soft.     Tenderness: There is no abdominal tenderness.  Musculoskeletal:     Cervical back: Neck supple.  Comments: Fistula RUE; R hand pale and cool compared to L hand  Skin:    General: Skin is warm and dry.  Neurological:     Mental Status: She is alert and oriented to person, place, and time.     Cranial Nerves: No cranial nerve deficit.     Motor: No abnormal muscle tone.     Deep Tendon Reflexes: Reflexes are normal and symmetric.     Comments: Fluent speech, normal finger-to-nose testing,  no clonus 5/5 strength and normal sensation x all 4 extremities  Psychiatric:        Thought Content: Thought content normal.        Judgment: Judgment normal.     ED Results / Procedures / Treatments   Labs (all labs ordered are listed, but only abnormal results are displayed) Labs Reviewed  BASIC METABOLIC PANEL - Abnormal; Notable for the following components:      Result Value   Sodium 128 (*)      Chloride 91 (*)    BUN 63 (*)    Creatinine, Ser 3.72 (*)    Calcium 7.8 (*)    GFR calc non Af Amer 12 (*)    GFR calc Af Amer 13 (*)    All other components within normal limits  CBC - Abnormal; Notable for the following components:   RBC 3.81 (*)    Hemoglobin 10.2 (*)    HCT 32.2 (*)    RDW 16.5 (*)    All other components within normal limits  POC SARS CORONAVIRUS 2 AG -  ED    EKG None  Radiology CT Head Wo Contrast  Result Date: 06/07/2019 CLINICAL DATA:  Severe headache. EXAM: CT HEAD WITHOUT CONTRAST TECHNIQUE: Contiguous axial images were obtained from the base of the skull through the vertex without intravenous contrast. COMPARISON:  None. FINDINGS: Brain: No evidence of acute infarction, hemorrhage, hydrocephalus, extra-axial collection or mass lesion/mass effect. Diffuse periventricular white matter hypoattenuation consistent with chronic small vessel ischemic change. Old left basal ganglia lacunar infarct. Vascular: Calcification of the left vertebral artery and bilateral carotid siphons. No hyper dense vessel. Skull: Normal. Negative for fracture or focal lesion. Sinuses/Orbits: Small retention cyst in the right sphenoid sinus. Otherwise sinuses are unremarkable. Normal appearance of the orbits. Other: None. IMPRESSION: 1. No acute intracranial abnormality. 2. Diffuse periventricular white matter hypoattenuation consistent with chronic small vessel ischemic change. Electronically Signed   By: Audie Pinto M.D.   On: 06/07/2019 12:33   DG Chest Port 1 View  Result Date: 06/07/2019 CLINICAL DATA:  Nausea and headache. Missed dialysis today. EXAM: PORTABLE CHEST 1 VIEW COMPARISON:  None. FINDINGS: Moderate to prominent enlargement of the cardiopericardial silhouette. Dialysis catheter tip: Right atrium. Bilateral primarily interstitial opacities in the lungs with some Kerley B lines favoring moderate congestive heart failure. May be some early perihilar and basilar  airspace opacities for example in the left mid lung and right lung base. IMPRESSION: 1. Moderate congestive heart failure. 2. Right IJ dialysis catheter tip: Right atrium. Electronically Signed   By: Van Clines M.D.   On: 06/07/2019 12:37    Procedures Procedures (including critical care time)  Medications Ordered in ED Medications  diphenhydrAMINE (BENADRYL) injection 25 mg (25 mg Intravenous Given 06/07/19 1326)  metoCLOPramide (REGLAN) injection 10 mg (10 mg Intravenous Given 06/07/19 1325)    ED Course  I have reviewed the triage vital signs and the nursing notes.  Pertinent labs & imaging results that were available  during my care of the patient were reviewed by me and considered in my medical decision making (see chart for details).    MDM Rules/Calculators/A&P                      Alert, well appearing, neuro intact on exam. Hypertensive but due for dialysis and hasn't had morning meds. Gave migraine cocktail while obtaining labs and head Ct. Head CT negative acute. CXR w/ CHF findings, pt w/ normal WOB and 100% on RA. Denies SOB. K 5.0. I do not feel she needs emergent dialysis but I have emphasized need for f/u with dialysis clinic ASAP.   On reassessment, pt feels well, headache resolving. She had head CT <6 hours from time of onset which is sensitive for detection of SAH.  I have extensively reviewed return precautions with her and she voiced understanding. Final Clinical Impression(s) / ED Diagnoses Final diagnoses:  Acute nonintractable headache, unspecified headache type  Nausea  ESRD (end stage renal disease) on dialysis [facility])    Rx / DC Orders ED Discharge Orders    None       Kyon Bentler, Wenda Overland, MD 06/07/19 2047

## 2019-06-09 ENCOUNTER — Other Ambulatory Visit: Payer: Self-pay

## 2019-06-09 ENCOUNTER — Emergency Department (HOSPITAL_COMMUNITY): Payer: Self-pay

## 2019-06-09 ENCOUNTER — Encounter (HOSPITAL_COMMUNITY): Payer: Self-pay | Admitting: Emergency Medicine

## 2019-06-09 ENCOUNTER — Inpatient Hospital Stay (HOSPITAL_COMMUNITY)
Admission: EM | Admit: 2019-06-09 | Discharge: 2019-06-12 | DRG: 640 | Disposition: A | Discharge status: Home / Self Care | Payer: Self-pay | Attending: Internal Medicine | Admitting: Internal Medicine

## 2019-06-09 DIAGNOSIS — I160 Hypertensive urgency: Secondary | ICD-10-CM | POA: Diagnosis present

## 2019-06-09 DIAGNOSIS — I313 Pericardial effusion (noninflammatory): Secondary | ICD-10-CM | POA: Diagnosis present

## 2019-06-09 DIAGNOSIS — N2581 Secondary hyperparathyroidism of renal origin: Secondary | ICD-10-CM | POA: Diagnosis present

## 2019-06-09 DIAGNOSIS — J811 Chronic pulmonary edema: Secondary | ICD-10-CM | POA: Diagnosis present

## 2019-06-09 DIAGNOSIS — D631 Anemia in chronic kidney disease: Secondary | ICD-10-CM | POA: Diagnosis present

## 2019-06-09 DIAGNOSIS — N186 End stage renal disease: Secondary | ICD-10-CM | POA: Diagnosis present

## 2019-06-09 DIAGNOSIS — R778 Other specified abnormalities of plasma proteins: Secondary | ICD-10-CM | POA: Diagnosis present

## 2019-06-09 DIAGNOSIS — I120 Hypertensive chronic kidney disease with stage 5 chronic kidney disease or end stage renal disease: Secondary | ICD-10-CM | POA: Diagnosis present

## 2019-06-09 DIAGNOSIS — R079 Chest pain, unspecified: Secondary | ICD-10-CM | POA: Diagnosis present

## 2019-06-09 DIAGNOSIS — E8770 Fluid overload, unspecified: Principal | ICD-10-CM | POA: Diagnosis present

## 2019-06-09 DIAGNOSIS — D638 Anemia in other chronic diseases classified elsewhere: Secondary | ICD-10-CM | POA: Diagnosis present

## 2019-06-09 DIAGNOSIS — R748 Abnormal levels of other serum enzymes: Secondary | ICD-10-CM | POA: Diagnosis present

## 2019-06-09 DIAGNOSIS — R072 Precordial pain: Secondary | ICD-10-CM

## 2019-06-09 DIAGNOSIS — Z992 Dependence on renal dialysis: Secondary | ICD-10-CM

## 2019-06-09 DIAGNOSIS — I248 Other forms of acute ischemic heart disease: Secondary | ICD-10-CM | POA: Diagnosis present

## 2019-06-09 DIAGNOSIS — Z794 Long term (current) use of insulin: Secondary | ICD-10-CM

## 2019-06-09 DIAGNOSIS — J810 Acute pulmonary edema: Secondary | ICD-10-CM

## 2019-06-09 DIAGNOSIS — E8889 Other specified metabolic disorders: Secondary | ICD-10-CM | POA: Diagnosis present

## 2019-06-09 DIAGNOSIS — Z20822 Contact with and (suspected) exposure to covid-19: Secondary | ICD-10-CM | POA: Diagnosis present

## 2019-06-09 DIAGNOSIS — Z79899 Other long term (current) drug therapy: Secondary | ICD-10-CM

## 2019-06-09 DIAGNOSIS — R112 Nausea with vomiting, unspecified: Secondary | ICD-10-CM | POA: Diagnosis present

## 2019-06-09 DIAGNOSIS — J9601 Acute respiratory failure with hypoxia: Secondary | ICD-10-CM | POA: Diagnosis present

## 2019-06-09 DIAGNOSIS — E1122 Type 2 diabetes mellitus with diabetic chronic kidney disease: Secondary | ICD-10-CM | POA: Diagnosis present

## 2019-06-09 DIAGNOSIS — Z9115 Patient's noncompliance with renal dialysis: Secondary | ICD-10-CM

## 2019-06-09 DIAGNOSIS — E119 Type 2 diabetes mellitus without complications: Secondary | ICD-10-CM

## 2019-06-09 LAB — CBC WITH DIFFERENTIAL/PLATELET
Abs Immature Granulocytes: 0.03 10*3/uL (ref 0.00–0.07)
Basophils Absolute: 0 10*3/uL (ref 0.0–0.1)
Basophils Relative: 1 %
Eosinophils Absolute: 0 10*3/uL (ref 0.0–0.5)
Eosinophils Relative: 0 %
HCT: 32.5 % — ABNORMAL LOW (ref 36.0–46.0)
Hemoglobin: 10.6 g/dL — ABNORMAL LOW (ref 12.0–15.0)
Immature Granulocytes: 0 %
Lymphocytes Relative: 7 %
Lymphs Abs: 0.5 10*3/uL — ABNORMAL LOW (ref 0.7–4.0)
MCH: 26.8 pg (ref 26.0–34.0)
MCHC: 32.6 g/dL (ref 30.0–36.0)
MCV: 82.1 fL (ref 80.0–100.0)
Monocytes Absolute: 0.4 10*3/uL (ref 0.1–1.0)
Monocytes Relative: 5 %
Neutro Abs: 6.6 10*3/uL (ref 1.7–7.7)
Neutrophils Relative %: 87 %
Platelets: 244 10*3/uL (ref 150–400)
RBC: 3.96 MIL/uL (ref 3.87–5.11)
RDW: 15.7 % — ABNORMAL HIGH (ref 11.5–15.5)
WBC: 7.6 10*3/uL (ref 4.0–10.5)
nRBC: 0 % (ref 0.0–0.2)

## 2019-06-09 LAB — COMPREHENSIVE METABOLIC PANEL
ALT: 29 U/L (ref 0–44)
AST: 33 U/L (ref 15–41)
Albumin: 3.5 g/dL (ref 3.5–5.0)
Alkaline Phosphatase: 132 U/L — ABNORMAL HIGH (ref 38–126)
Anion gap: 18 — ABNORMAL HIGH (ref 5–15)
BUN: 102 mg/dL — ABNORMAL HIGH (ref 8–23)
CO2: 21 mmol/L — ABNORMAL LOW (ref 22–32)
Calcium: 8.1 mg/dL — ABNORMAL LOW (ref 8.9–10.3)
Chloride: 86 mmol/L — ABNORMAL LOW (ref 98–111)
Creatinine, Ser: 5.77 mg/dL — ABNORMAL HIGH (ref 0.44–1.00)
GFR calc Af Amer: 8 mL/min — ABNORMAL LOW (ref >60)
GFR calc non Af Amer: 7 mL/min — ABNORMAL LOW (ref >60)
Glucose, Bld: 117 mg/dL — ABNORMAL HIGH (ref 70–99)
Potassium: 4.8 mmol/L (ref 3.5–5.1)
Sodium: 125 mmol/L — ABNORMAL LOW (ref 135–145)
Total Bilirubin: 0.7 mg/dL (ref 0.3–1.2)
Total Protein: 6.4 g/dL — ABNORMAL LOW (ref 6.5–8.1)

## 2019-06-09 LAB — GLUCOSE, CAPILLARY: Glucose-Capillary: 172 mg/dL — ABNORMAL HIGH (ref 70–99)

## 2019-06-09 LAB — I-STAT CHEM 8, ED
BUN: 107 mg/dL — ABNORMAL HIGH (ref 8–23)
Calcium, Ion: 0.9 mmol/L — ABNORMAL LOW (ref 1.15–1.40)
Chloride: 87 mmol/L — ABNORMAL LOW (ref 98–111)
Creatinine, Ser: 6.2 mg/dL — ABNORMAL HIGH (ref 0.44–1.00)
Glucose, Bld: 117 mg/dL — ABNORMAL HIGH (ref 70–99)
HCT: 33 % — ABNORMAL LOW (ref 36.0–46.0)
Hemoglobin: 11.2 g/dL — ABNORMAL LOW (ref 12.0–15.0)
Potassium: 4.7 mmol/L (ref 3.5–5.1)
Sodium: 123 mmol/L — ABNORMAL LOW (ref 135–145)
TCO2: 23 mmol/L (ref 22–32)

## 2019-06-09 LAB — LACTIC ACID, PLASMA
Lactic Acid, Venous: 0.6 mmol/L (ref 0.5–1.9)
Lactic Acid, Venous: 0.7 mmol/L (ref 0.5–1.9)

## 2019-06-09 LAB — RESPIRATORY PANEL BY RT PCR (FLU A&B, COVID)
Influenza A by PCR: NEGATIVE
Influenza B by PCR: NEGATIVE
SARS Coronavirus 2 by RT PCR: NEGATIVE

## 2019-06-09 LAB — TROPONIN I (HIGH SENSITIVITY)
Troponin I (High Sensitivity): 18 ng/L — ABNORMAL HIGH
Troponin I (High Sensitivity): 23 ng/L — ABNORMAL HIGH (ref <18)

## 2019-06-09 LAB — LIPASE, BLOOD: Lipase: 130 U/L — ABNORMAL HIGH (ref 11–51)

## 2019-06-09 LAB — HEMOGLOBIN A1C
Hgb A1c MFr Bld: 4.9 % (ref 4.8–5.6)
Mean Plasma Glucose: 93.93 mg/dL

## 2019-06-09 MED ORDER — HEPARIN SODIUM (PORCINE) 1000 UNIT/ML DIALYSIS
20.0000 [IU]/kg | INTRAMUSCULAR | Status: DC | PRN
  Filled 2019-06-09: qty 1

## 2019-06-09 MED ORDER — CHLORHEXIDINE GLUCONATE CLOTH 2 % EX PADS
6.0000 | MEDICATED_PAD | Freq: Every day | CUTANEOUS | Status: DC
  Administered 2019-06-10 – 2019-06-12 (×3): 6 via TOPICAL

## 2019-06-09 MED ORDER — HEPARIN SODIUM (PORCINE) 1000 UNIT/ML IJ SOLN
INTRAMUSCULAR | Status: AC
  Administered 2019-06-09: 3200 [IU]
  Filled 2019-06-09: qty 4

## 2019-06-09 MED ORDER — LABETALOL HCL 5 MG/ML IV SOLN
10.0000 mg | INTRAVENOUS | Status: DC | PRN
  Administered 2019-06-10 – 2019-06-11 (×3): 10 mg via INTRAVENOUS
  Filled 2019-06-09 (×4): qty 4

## 2019-06-09 MED ORDER — ONDANSETRON HCL 4 MG/2ML IJ SOLN
4.0000 mg | Freq: Once | INTRAMUSCULAR | Status: AC
  Administered 2019-06-09: 11:00:00 4 mg via INTRAVENOUS
  Filled 2019-06-09: qty 2

## 2019-06-09 MED ORDER — INSULIN ASPART 100 UNIT/ML ~~LOC~~ SOLN
0.0000 [IU] | Freq: Three times a day (TID) | SUBCUTANEOUS | Status: DC
  Administered 2019-06-09 – 2019-06-11 (×2): 1 [IU] via SUBCUTANEOUS

## 2019-06-09 MED ORDER — ONDANSETRON HCL 4 MG/2ML IJ SOLN
4.0000 mg | Freq: Four times a day (QID) | INTRAMUSCULAR | Status: DC | PRN
  Administered 2019-06-09 – 2019-06-10 (×3): 4 mg via INTRAVENOUS
  Filled 2019-06-09 (×4): qty 2

## 2019-06-09 MED ORDER — AMLODIPINE BESYLATE 5 MG PO TABS
5.0000 mg | ORAL_TABLET | Freq: Every day | ORAL | Status: DC
  Administered 2019-06-11: 5 mg via ORAL
  Filled 2019-06-09 (×2): qty 1

## 2019-06-09 MED ORDER — ACETAMINOPHEN 325 MG PO TABS
650.0000 mg | ORAL_TABLET | Freq: Four times a day (QID) | ORAL | Status: DC | PRN
  Administered 2019-06-10: 650 mg via ORAL
  Filled 2019-06-09: qty 2

## 2019-06-09 MED ORDER — HEPARIN SODIUM (PORCINE) 5000 UNIT/ML IJ SOLN
5000.0000 [IU] | Freq: Three times a day (TID) | INTRAMUSCULAR | Status: DC
  Administered 2019-06-09 – 2019-06-12 (×6): 5000 [IU] via SUBCUTANEOUS
  Filled 2019-06-09 (×6): qty 1

## 2019-06-09 MED ORDER — ONDANSETRON HCL 4 MG PO TABS: 4.0000 mg | ORAL_TABLET | Freq: Four times a day (QID) | ORAL | Status: DC | PRN

## 2019-06-09 MED ORDER — LIDOCAINE HCL (PF) 1 % IJ SOLN: 5.0000 mL | INTRAMUSCULAR | Status: DC | PRN

## 2019-06-09 MED ORDER — CALCIUM CARBONATE-VITAMIN D 500-200 MG-UNIT PO TABS
1.0000 | ORAL_TABLET | Freq: Every day | ORAL | Status: DC
  Administered 2019-06-12: 1 via ORAL
  Filled 2019-06-09: qty 1

## 2019-06-09 MED ORDER — ALBUTEROL SULFATE (2.5 MG/3ML) 0.083% IN NEBU: 2.5000 mg | INHALATION_SOLUTION | Freq: Four times a day (QID) | RESPIRATORY_TRACT | Status: DC | PRN

## 2019-06-09 MED ORDER — SODIUM CHLORIDE 0.9 % IV SOLN: 100.0000 mL | INTRAVENOUS | Status: DC | PRN

## 2019-06-09 MED ORDER — ALTEPLASE 2 MG IJ SOLR: 2.0000 mg | Freq: Once | INTRAMUSCULAR | Status: DC | PRN

## 2019-06-09 MED ORDER — SODIUM CHLORIDE 0.9% FLUSH
3.0000 mL | Freq: Two times a day (BID) | INTRAVENOUS | Status: DC
  Administered 2019-06-09: 10 mL via INTRAVENOUS
  Administered 2019-06-10 – 2019-06-11 (×3): 3 mL via INTRAVENOUS

## 2019-06-09 MED ORDER — PENTAFLUOROPROP-TETRAFLUOROETH EX AERO
1.0000 "application " | INHALATION_SPRAY | CUTANEOUS | Status: DC | PRN
  Filled 2019-06-09: qty 30

## 2019-06-09 MED ORDER — ACETAMINOPHEN 650 MG RE SUPP: 650.0000 mg | Freq: Four times a day (QID) | RECTAL | Status: DC | PRN

## 2019-06-09 MED ORDER — LIDOCAINE-PRILOCAINE 2.5-2.5 % EX CREA
1.0000 "application " | TOPICAL_CREAM | CUTANEOUS | Status: DC | PRN
  Filled 2019-06-09: qty 5

## 2019-06-09 NOTE — H&P (Signed)
History and Physical    Amber Dixon [id number] DOB: 14-Jan-1951 DOA: 06/09/2019  Referring MD/NP/PA: Quintella Reichert, MD PCP: Patient, No Pcp Per  Patient coming from: Home  Chief Complaint: Nausea and vomiting  I have personally briefly reviewed patient's old medical records in [place]   HPI: Amber Dixon is a 69 y.o. female with medical history significant of ESRD on HD(TTS), hypertension, and diabetes mellitus type 2, presents with complaints of nausea and vomiting over the last 2 days.  She is obtained with the use of translator services.  Previously evaluated in emergency department 2 days ago with complaints of frontal headache.  At that time she reported having nausea and vomiting as well.  CT scan of the brain did not show any acute abnormalities and patient received Reglan and Benadryl with some improvement in symptoms.  States that her symptoms of headache, nausea, and vomiting returned shortly thereafter being discharged home.  Missed her hemodialysis on Saturday due to being in the emergency department.  He has been unable to keep any significant food or liquids down.  Emesis is noted to be nonbloody and watery this time.  Associated symptoms include complaints of left-sided chest pain and shortness of breath.  Denies any abdominal pain, fever, or cough.  ED Course: Upon admission into the emergency department patient was seen to be afebrile, pulse 98 108, respiration 24-32, blood pressure elevated up to 195/92, and O2 saturations currently maintained on 2 L nasal cannula oxygen.  Patient was seen to desat into the upper 80s.  Labs significant for hemoglobin 10.6, sodium 125, potassium 4.8, BUN 102, creatinine 5.77, anion gap 18, alkaline phosphatase 132, and lipase 130.  CT scan of the abdomen and pelvis revealed bilateral pleural effusions, diffuse airspace disease concerning for edema, and cardiomegaly with small pleural effusion.  Nephrology  was formally consulted.  Review of Systems  Constitutional: Negative for fever and weight loss.  HENT: Negative for ear pain.   Eyes: Negative for photophobia and pain.  Respiratory: Positive for shortness of breath. Negative for cough.   Cardiovascular: Positive for chest pain. Negative for leg swelling.  Gastrointestinal: Positive for nausea and vomiting. Negative for abdominal pain.  Genitourinary: Negative for dysuria and hematuria.  Musculoskeletal: Negative for falls.  Skin: Negative for rash.  Neurological: Negative for loss of consciousness and weakness.  Psychiatric/Behavioral: Negative for memory loss and substance abuse.    Past Medical History:  Diagnosis Date   Diabetes mellitus without complication ([place])    Hypertension    Renal disorder     Past Surgical History:  Procedure Laterality Date   AV FISTULA PLACEMENT Right      reports that she has never smoked. She has never used smokeless tobacco. She reports previous drug use. No history on file for alcohol.  No Known Allergies  History reviewed. No pertinent family history.  Prior to Admission medications   Medication Sig Start Date End Date Taking? Authorizing Provider  amLODipine (NORVASC) 5 MG tablet Take 5 mg by mouth daily. 05/31/19  Yes [provider]  calcium-vitamin D (OSCAL WITH D) 500-200 MG-UNIT tablet Take 1 tablet by mouth daily with breakfast.   Yes [provider]  PRESCRIPTION MEDICATION Inject 5 Units into the skin daily. Insulin comes from "[place]" however, patient does not know the name of the insulin. Patient states she took 5 units yesterday.   Yes [provider]    Physical Exam:  Constitutional: Elderly female who  appears to be in no acute distress at this time Vitals:   06/09/19 0930 06/09/19 0945 06/09/19 1000 06/09/19 1030  BP: (!) 191/91 (!) 182/95 (!) 188/97 (!) 189/92  Pulse: 99 100 99 (!) 107  Resp: (!) 26 (!) 24 (!) 26 (!) 32  Temp:        TempSrc:      SpO2: 97% 100% 100% 96%   Eyes: PERRL, lids and conjunctivae normal ENMT: Mucous membranes are moist. Posterior pharynx clear of any exudate or lesions.   Neck: normal, supple, no masses, no thyromegaly Respiratory: clear to auscultation bilaterally, no wheezing, no crackles. Normal respiratory effort. No accessory muscle use.  Cardiovascular: Regular rate and rhythm, no murmurs / rubs / gallops. No extremity edema. 2+ pedal pulses. No carotid bruits.  Right chest HD catheter present. Abdomen: no tenderness, no masses palpated. No hepatosplenomegaly. Bowel sounds positive.  Musculoskeletal: no clubbing / cyanosis. No joint deformity upper and lower extremities. Good ROM, no contractures. Normal muscle tone.  Skin: no rashes, lesions, ulcers. No induration Neurologic: CN 2-12 grossly intact. Sensation intact, DTR normal. Strength 5/5 in all 4.  Psychiatric: Normal judgment and insight. Alert and oriented x 3. Normal mood.     Labs on Admission: I have personally reviewed following labs and imaging studies  CBC: Recent Labs  Lab 06/07/19 1300 06/09/19 0841 06/09/19 0842  WBC 6.5 7.6  --   NEUTROABS  --  6.6  --   HGB 10.2* 10.6* 11.2*  HCT 32.2* 32.5* 33.0*  MCV 84.5 82.1  --   PLT 267 244  --    Basic Metabolic Panel: Recent Labs  Lab 06/07/19 1156 06/09/19 0841 06/09/19 0842  NA 128* 125* 123*  K 5.0 4.8 4.7  CL 91* 86* 87*  CO2 23 21*  --   GLUCOSE 90 117* 117*  BUN 63* 102* 107*  CREATININE 3.72* 5.77* 6.20*  CALCIUM 7.8* 8.1*  --    GFR: Estimated Creatinine Clearance: 5.8 mL/min (A) (by C-G formula based on SCr of 6.2 mg/dL (H)). Liver Function Tests: Recent Labs  Lab 06/09/19 0841  AST 33  ALT 29  ALKPHOS 132*  BILITOT 0.7  PROT 6.4*  ALBUMIN 3.5   Recent Labs  Lab 06/09/19 0841  LIPASE 130*   No results for input(s): AMMONIA in the last 168 hours. Coagulation Profile: No results for input(s): INR, PROTIME in the last 168  hours. Cardiac Enzymes: No results for input(s): CKTOTAL, CKMB, CKMBINDEX, TROPONINI in the last 168 hours. BNP (last 3 results) No results for input(s): PROBNP in the last 8760 hours. HbA1C: No results for input(s): HGBA1C in the last 72 hours. CBG: No results for input(s): GLUCAP in the last 168 hours. Lipid Profile: No results for input(s): CHOL, HDL, LDLCALC, TRIG, CHOLHDL, LDLDIRECT in the last 72 hours. Thyroid Function Tests: No results for input(s): TSH, T4TOTAL, FREET4, T3FREE, THYROIDAB in the last 72 hours. Anemia Panel: No results for input(s): VITAMINB12, FOLATE, FERRITIN, TIBC, IRON, RETICCTPCT in the last 72 hours. Urine analysis: No results found for: COLORURINE, APPEARANCEUR, LABSPEC, PHURINE, GLUCOSEU, HGBUR, BILIRUBINUR, KETONESUR, PROTEINUR, UROBILINOGEN, NITRITE, LEUKOCYTESUR Sepsis Labs: Recent Results (from the past 240 hour(s))  Respiratory Panel by RT PCR (Flu A&B, Covid) - Nasopharyngeal Swab     Status: None   Collection Time: 06/09/19  8:27 AM   Specimen: Nasopharyngeal Swab  Result Value Ref Range Status   SARS Coronavirus 2 by RT PCR NEGATIVE NEGATIVE Final    Comment: (NOTE) SARS-CoV-2  target nucleic acids are NOT DETECTED. The SARS-CoV-2 RNA is generally detectable in upper respiratoy specimens during the acute phase of infection. The lowest concentration of SARS-CoV-2 viral copies this assay can detect is 131 copies/mL. A negative result does not preclude SARS-Cov-2 infection and should not be used as the sole basis for treatment or other patient management decisions. A negative result may occur with  improper specimen collection/handling, submission of specimen other than nasopharyngeal swab, presence of viral mutation(s) within the areas targeted by this assay, and inadequate number of viral copies (<131 copies/mL). A negative result must be combined with clinical observations, patient history, and epidemiological information. The expected  result is Negative. Fact Sheet for Patients:  PinkCheek.be Fact Sheet for Healthcare Providers:  GravelBags.it This test is not yet ap proved or cleared by the [place] FDA and  has been authorized for detection and/or diagnosis of SARS-CoV-2 by FDA under an Emergency Use Authorization (EUA). This EUA will remain  in effect (meaning this test can be used) for the duration of the COVID-19 declaration under Section 564(b)(1) of the Act, 21 U.S.C. section 360bbb-3(b)(1), unless the authorization is terminated or revoked sooner.    Influenza A by PCR NEGATIVE NEGATIVE Final   Influenza B by PCR NEGATIVE NEGATIVE Final    Comment: (NOTE) The Xpert Xpress SARS-CoV-2/FLU/RSV assay is intended as an aid in  the diagnosis of influenza from Nasopharyngeal swab specimens and  should not be used as a sole basis for treatment. Nasal washings and  aspirates are unacceptable for Xpert Xpress SARS-CoV-2/FLU/RSV  testing. Fact Sheet for Patients: PinkCheek.be Fact Sheet for Healthcare Providers: GravelBags.it This test is not yet approved or cleared by the [place] FDA and  has been authorized for detection and/or diagnosis of SARS-CoV-2 by  FDA under an Emergency Use Authorization (EUA). This EUA will remain  in effect (meaning this test can be used) for the duration of the  Covid-19 declaration under Section 564(b)(1) of the Act, 21  U.S.C. section 360bbb-3(b)(1), unless the authorization is  terminated or revoked. Performed at [facility], [place] [address] [place], [place] [address]      Radiological Exams on Admission: CT Abdomen Pelvis Wo Contrast  Result Date: 06/09/2019 CLINICAL DATA:  Nausea. Left-sided chest pain. End-stage renal disease on dialysis. EXAM: CT ABDOMEN AND PELVIS WITHOUT CONTRAST TECHNIQUE: Multidetector CT imaging of the abdomen and pelvis  was performed following the standard protocol without IV contrast. COMPARISON:  Chest radiographs 06/09/2019 and 06/07/2019. FINDINGS: Lower chest: Bilateral pleural effusions are noted. Moderate diffuse airspace disease is present. The heart is enlarged. Small pericardial effusion present. Hepatobiliary: No focal liver abnormality is seen. No gallstones, gallbladder wall thickening, or biliary dilatation. Pancreas: Unremarkable. No pancreatic ductal dilatation or surrounding inflammatory changes. Spleen: Normal in size without focal abnormality. Adrenals/Urinary Tract: Adrenal glands are normal bilaterally. Kidneys and ureters are unremarkable. No stone or mass lesion present. The urinary bladder is contracted, within normal limits. Stomach/Bowel: Stomach and duodenum are within normal limits. The small bowel unremarkable. Terminal ileum is normal. Appendix is visualized and normal. The ascending and transverse colon are within normal limits. Descending and sigmoid colon are normal. Vascular/Lymphatic: Atherosclerotic calcifications present the aorta and branch vessels without aneurysm. Reproductive: Uterus is calcified. Adnexa are unremarkable. Other: Small amount of free fluid is present. There is fluid about the spleen. There is some stranding in the mesentery as well as in the subcutaneous soft tissues. Musculoskeletal: Right femur ORIF is noted. Vertebral body  heights and alignment are maintained. Focal lytic or blastic lesions are present. Degenerative changes are present at both hips, right greater left. IMPRESSION: 1. Bilateral pleural effusions and diffuse airspace disease concerning for edema or infection. Airspace disease has increased over the last 2 days. 2. Cardiomegaly with a small pericardial effusion. 3. Small amount of free fluid about the spleen and within the subcutaneous soft tissues may be secondary to congestive heart failure. 4. Aortic Atherosclerosis (ICD10-I70.0). Electronically Signed    By: San Morelle M.D.   On: 06/09/2019 10:29   CT Head Wo Contrast  Result Date: 06/07/2019 CLINICAL DATA:  Severe headache. EXAM: CT HEAD WITHOUT CONTRAST TECHNIQUE: Contiguous axial images were obtained from the base of the skull through the vertex without intravenous contrast. COMPARISON:  None. FINDINGS: Brain: No evidence of acute infarction, hemorrhage, hydrocephalus, extra-axial collection or mass lesion/mass effect. Diffuse periventricular white matter hypoattenuation consistent with chronic small vessel ischemic change. Old left basal ganglia lacunar infarct. Vascular: Calcification of the left vertebral artery and bilateral carotid siphons. No hyper dense vessel. Skull: Normal. Negative for fracture or focal lesion. Sinuses/Orbits: Small retention cyst in the right sphenoid sinus. Otherwise sinuses are unremarkable. Normal appearance of the orbits. Other: None. IMPRESSION: 1. No acute intracranial abnormality. 2. Diffuse periventricular white matter hypoattenuation consistent with chronic small vessel ischemic change. Electronically Signed   By: Audie Pinto M.D.   On: 06/07/2019 12:33   DG Chest Port 1 View  Result Date: 06/09/2019 CLINICAL DATA:  Chest pain, shortness of breath. EXAM: PORTABLE CHEST 1 VIEW COMPARISON:  Chest radiograph 06/07/2019 FINDINGS: Unchanged position of a right sided dialysis catheter with tip projecting in the region of the right atrium. Unchanged cardiomegaly. Again demonstrated are prominent bilateral interstitial airspace opacities. More conspicuous than on prior examination there are more ill-defined opacities within the bilateral lung bases. Trace right and small left pleural effusions new from prior examination no evidence of pneumothorax. No acute bony abnormality. Overlying cardiac monitoring leads. IMPRESSION: Redemonstrated bilateral interstitial opacities. New from prior exam, there are more ill-defined opacities within the bilateral lung bases.  Findings may reflect interstitial/pulmonary edema or infection. Trace right with small left pleural effusions, new from prior exam. Unchanged cardiomegaly. Electronically Signed   By: Kellie Simmering DO   On: 06/09/2019 08:29   DG Chest Port 1 View  Result Date: 06/07/2019 CLINICAL DATA:  Nausea and headache. Missed dialysis today. EXAM: PORTABLE CHEST 1 VIEW COMPARISON:  None. FINDINGS: Moderate to prominent enlargement of the cardiopericardial silhouette. Dialysis catheter tip: Right atrium. Bilateral primarily interstitial opacities in the lungs with some Kerley B lines favoring moderate congestive heart failure. May be some early perihilar and basilar airspace opacities for example in the left mid lung and right lung base. IMPRESSION: 1. Moderate congestive heart failure. 2. Right IJ dialysis catheter tip: Right atrium. Electronically Signed   By: Van Clines M.D.   On: 06/07/2019 12:37    EKG: Independently reviewed.  Sinus tachycardia 102 bpm  Assessment/Plan Acute respiratory failure with hypoxia secondary to fluid overload with ESRD on HD: Patient presents after recently missing hemodialysis with complaints of chest pain with nausea and vomiting.  Patient appears to be acutely fluid overloaded and CT imaging noted bilateral pleural effusions and edema.  O2 saturations maintained on 2 L of nasal cannula oxygen.  Nephrology was formally consulted and we will take the patient for HD. -Admit to a medical telemetry -Continuous pulse oximetry with nasal cannula oxygen to maintain O2 saturations -Fluid  restriction -Appreciate nephrology, will follow-up for any further recommendations  Chest pain, elevated troponin: Patient reports complaints of chest pain.  Initial high-sensitivity troponin 18, but suspect secondary to demand.  -Continue to monitor  Nausea and vomiting: Patient presented with complaints of nausea and vomiting.  Lipase was elevated but CT scan of the abdomen and pelvis did not  show any signs of pancreatitis.  BUN elevated up to 102.  Suspect possibly secondary to uremia. -Antiemetics as needed -May warrant further evaluation if symptoms persist  Hypertensive urgency: Acute.  Patient presents with blood pressures elevated up to 195/92.  Home medications include amlodipine. -Continue amlodipine -Labetalol IV as needed  Diabetes mellitus type 2: Patient reports being on 5 to 6 units of insulin at home. -Hypoglycemic protocol -Check hemoglobin A1c -CBGs before every meal with very sensitive SSI  Anemia of chronic kidney disease: Hemoglobin 10.6 which appears near patient's baseline. -Continue to monitor  DVT prophylaxis: Heparin Code Status: Full Family Communication: No family present at bedside Disposition Plan: Likely discharge home once medically stable Consults called: Nephrology Admission status: Inpatient  Norval Morton MD [facility] Pager [phone]   If 7PM-7AM, please contact night-coverage [email or web address] Password [facility]  06/09/2019, 11:14 AM

## 2019-06-09 NOTE — ED Triage Notes (Addendum)
Patient arrives via son due to family unable to get the patient to dialysis.  Son states patient was not feeling well and dialysis wold not accommodate pt today, was told to come back Tuesday which is her regular schedule.  Son describes her having nausea, and left sided CP that started this morning.Patient is a T/Th/Sat dialysis patient. Pt seen here Saturday 2/13.  Last treatment on Thursday. Primarily spanish-speaking.

## 2019-06-09 NOTE — ED Notes (Signed)
IV team at bedside 

## 2019-06-09 NOTE — ED Notes (Signed)
Pt's son Clifton James is primary contact for pt [phone].

## 2019-06-09 NOTE — Consult Note (Addendum)
Amber Dixon Renal Consultation Note    Indication for Consultation:  Management of ESRD/hemodialysis, anemia, hypertension/volume, and secondary hyperparathyroidism. PCP:  HPI: Amber Dixon is a 69 y.o. female with ESRD and HTN who is being evaluated for dialysis needs.   History via video interpreter. She had presented to ED on 2/13 after waking with severe headache - head CT and labs stable. Her CXR did show some pulm edema, but she was felt stable to d/c home with plan for dialysis in near future. She missed her usual HD on that day d/t being in ED. Yesterday, she began to have CP and nausea/vomiting. Reports vomiting is constant and unable to keep anything down. Her family contacted her usual HD unit early this morning, actually presented there around 6:45am. Unfortunately, they were unable to accomodate her for an off-schedule treatment due to staffing shortage so she was directed to the ED for evaluation.  Today, in ED - found to be hypertensive and tachypneic. Labs show Na 123, K 4.7, BUN 107, Ca 8.7, LA 0.6, WBC 7.6, Hgb 11.2, trop 18. CXR shows worsened pulm edema and B effusions. Abd CT ordered and pending. Wearing nasal O2 currently - says CP and dyspnea are improved. Denies fever or diarrhea, no sharp abdominal pains.  Dialyzes at [facility] HD unit. As above, last HD was 2/11 (Thurs). Uses TDC for access. Has recent RUE AVF - of note, her R hand is cool and no palpable R radial pulse noted. The patient has 2 charts which need to be merged - in other chart (MR# [id number]), saw [facility] on 1/29 - documented steal syndrome and patient wanted to watch/wait for now and has f/u sched on 06/13/19.   Past Medical History:  Diagnosis Date  . Diabetes mellitus without complication ([place])   . Hypertension   . Renal disorder    Past Surgical History:  Procedure Laterality Date  . AV FISTULA PLACEMENT Right    History reviewed. No pertinent family history. Social  History:  reports that she has never smoked. She has never used smokeless tobacco. She reports previous drug use. No history on file for alcohol.  ROS: As per HPI otherwise negative.  Physical Exam: Vitals:   06/09/19 0745 06/09/19 0750 06/09/19 0800  BP: (!) 195/92 (!) 195/92 (!) 194/88  Pulse: (!) 104 (!) 104 (!) 101  Resp: (!) 26 (!) 27 (!) 29  Temp:  98.2 F (36.8 C)   TempSrc:  Oral   SpO2: 93% 94% 92%     General: Frail woman, NAD. Nasal O2 in place. Head: Normocephalic, atraumatic Neck: Supple without lymphadenopathy/masses.  Lungs: Diffuse crackles up 2/3 on R side, 1/2 way up in L chest Heart: Mild tachycardia, 2/6 murmur Abdomen: Soft, non-tender, non-distended with normoactive bowel sounds.  Musculoskeletal:  Strength and tone appear normal for age. Lower extremities: No LE edema. RUE AVF + bruit, R hand cool without palpable R radial pulse Neuro: Alert and oriented X 3. Moves all extremities spontaneously. Psych:  Responds to questions appropriately with a normal affect - spanish speaking. Dialysis Access: TDC in R chest, RUE AVF  No Known Allergies Prior to Admission medications   Medication Sig Start Date End Date Taking? Authorizing Provider  amLODipine (NORVASC) 5 MG tablet Take 5 mg by mouth daily. 05/31/19  Yes [provider]  calcium-vitamin D (OSCAL WITH D) 500-200 MG-UNIT tablet Take 1 tablet by mouth daily with breakfast.   Yes [provider]  [place]  5 Units into the skin daily. Insulin comes from "[place]" however, patient does not know the name of the insulin. Patient states she took 5 units yesterday.   Yes [provider]   Current Facility-Administered Medications  Medication Dose Route Frequency Provider Last Rate Last Admin  . Chlorhexidine Gluconate Cloth 2 % PADS 6 each  6 each Topical Q0600 Loren Racer, PA-C      . ondansetron [facility]) injection 4 mg  4 mg Intravenous Once Quintella Reichert,  MD       Current Outpatient Medications  Medication Sig Dispense Refill  . amLODipine (NORVASC) 5 MG tablet Take 5 mg by mouth daily.    . calcium-vitamin D (OSCAL WITH D) 500-200 MG-UNIT tablet Take 1 tablet by mouth daily with breakfast.    . PRESCRIPTION MEDICATION Inject 5 Units into the skin daily. Insulin comes from "[place]" however, patient does not know the name of the insulin. Patient states she took 5 units yesterday.     Labs: Basic Metabolic Panel: Recent Labs  Lab 06/07/19 1156 06/09/19 0842  NA 128* 123*  K 5.0 4.7  CL 91* 87*  CO2 23  --   GLUCOSE 90 117*  BUN 63* 107*  CREATININE 3.72* 6.20*  CALCIUM 7.8*  --    CBC: Recent Labs  Lab 06/07/19 1300 06/09/19 0841 06/09/19 0842  WBC 6.5 7.6  --   NEUTROABS  --  6.6  --   HGB 10.2* 10.6* 11.2*  HCT 32.2* 32.5* 33.0*  MCV 84.5 82.1  --   PLT 267 244  --    Cardiac Enzymes: No results for input(s): CKTOTAL, CKMB, CKMBINDEX, TROPONINI in the last 168 hours. CBG: No results for input(s): GLUCAP in the last 168 hours. Iron Studies: No results for input(s): IRON, TIBC, TRANSFERRIN, FERRITIN in the last 72 hours. Studies/Results: CT Head Wo Contrast  Result Date: 06/07/2019 CLINICAL DATA:  Severe headache. EXAM: CT HEAD WITHOUT CONTRAST TECHNIQUE: Contiguous axial images were obtained from the base of the skull through the vertex without intravenous contrast. COMPARISON:  None. FINDINGS: Brain: No evidence of acute infarction, hemorrhage, hydrocephalus, extra-axial collection or mass lesion/mass effect. Diffuse periventricular white matter hypoattenuation consistent with chronic small vessel ischemic change. Old left basal ganglia lacunar infarct. Vascular: Calcification of the left vertebral artery and bilateral carotid siphons. No hyper dense vessel. Skull: Normal. Negative for fracture or focal lesion. Sinuses/Orbits: Small retention cyst in the right sphenoid sinus. Otherwise sinuses are unremarkable. Normal  appearance of the orbits. Other: None. IMPRESSION: 1. No acute intracranial abnormality. 2. Diffuse periventricular white matter hypoattenuation consistent with chronic small vessel ischemic change. Electronically Signed   By: Audie Pinto M.D.   On: 06/07/2019 12:33   DG Chest Port 1 View  Result Date: 06/09/2019 CLINICAL DATA:  Chest pain, shortness of breath. EXAM: PORTABLE CHEST 1 VIEW COMPARISON:  Chest radiograph 06/07/2019 FINDINGS: Unchanged position of a right sided dialysis catheter with tip projecting in the region of the right atrium. Unchanged cardiomegaly. Again demonstrated are prominent bilateral interstitial airspace opacities. More conspicuous than on prior examination there are more ill-defined opacities within the bilateral lung bases. Trace right and small left pleural effusions new from prior examination no evidence of pneumothorax. No acute bony abnormality. Overlying cardiac monitoring leads. IMPRESSION: Redemonstrated bilateral interstitial opacities. New from prior exam, there are more ill-defined opacities within the bilateral lung bases. Findings may reflect interstitial/pulmonary edema or infection. Trace right with small left pleural effusions, new from prior exam.  Unchanged cardiomegaly. Electronically Signed   By: Kellie Simmering DO   On: 06/09/2019 08:29   DG Chest Port 1 View  Result Date: 06/07/2019 CLINICAL DATA:  Nausea and headache. Missed dialysis today. EXAM: PORTABLE CHEST 1 VIEW COMPARISON:  None. FINDINGS: Moderate to prominent enlargement of the cardiopericardial silhouette. Dialysis catheter tip: Right atrium. Bilateral primarily interstitial opacities in the lungs with some Kerley B lines favoring moderate congestive heart failure. May be some early perihilar and basilar airspace opacities for example in the left mid lung and right lung base. IMPRESSION: 1. Moderate congestive heart failure. 2. Right IJ dialysis catheter tip: Right atrium. Electronically Signed    By: Van Clines M.D.   On: 06/07/2019 12:37    Dialysis Orders:  TTS at [facility] -> missed 2/13, last HD 2/11 4hr, 400/800, EDW 32.4kg, 2K/2.25Ca, TDC, heparin 2000 bolus - Venofer 100 x 10 ordered - Mircera 100 q 2 weeks (last 1/19) - Hgb 10.5 on 2/11 - Hectoral 34mcg IV q HD  Assessment/Plan: 1.  Pulm edema: Will dialyze today - at this point, unclear if something else going on with persistent vomiting -> will need to be admitted and will need COVID test prior to bringing upstairs.  2. Vomiting: Unclear cause at this time. Initial cardiac work-up (Trop/EKG) ok, Abd CT pending. Doubt uremia although ^ BUN - only missed 1 time and Kt/v are typically > 2.5. 3.  ESRD: Usual TTS sched - HD today as above, then again tomorrow per her usual day - either here or at her outpatient clinic. 4.  Hypertension/volume: BP high + pulm edema, 2.5L UFG today - will need more tomorrow. 5.  Anemia: Hgb 11.2 - wait on ESA therapy for now. 6.  Metabolic bone disease: Ca ok, Phos pending.  ADDENDUM: Abd CT with pulm edema, small pericardial effusion and small free fluid in abd around spleen. Per primary to decipher.  Veneta Penton, PA-C 06/09/2019, 9:47 AM  [facility] Pager: [phone]

## 2019-06-09 NOTE — ED Provider Notes (Signed)
[facility] EMERGENCY DEPARTMENT Provider Note   CSN: [id number] Arrival date & time: 06/09/19  9509     History Chief Complaint  Patient presents with  . Chest Pain  . Emesis    Amber Dixon is a 69 y.o. female.  TR  The history is provided by the patient and medical records. A language interpreter was used Administrator, arts).  Chest Pain Associated symptoms: vomiting   Emesis  Amber Dixon is a 69 y.o. female who presents to the Emergency Department complaining of chest pain and vomiting. Level V caveat due to language barrier. History is provided by the patient and her son. She presents to the emergency department complaining of vomiting, headache and chest pain. She developed a headache two days ago and was evaluated in the emergency department and discharged home. Since then her headache has persisted and yesterday she developed vomiting and left-sided chest pain that radiates to her shoulder. She describes the vomiting as numerous episodes. She vomits anytime she attempts to drink water. She has associated small volume diarrhea. No prior similar symptoms. She also complains of associated shortness of breath. She denies any fevers, abdominal pain, leg swelling or pain. She is a dialysis patient and dialyzed is Tuesday, Thursday, Saturday. Her last dialysis session was on Thursday. She attempted to go to dialysis today but they told her she was sick and needed to be evaluated in the emergency department.    Past Medical History:  Diagnosis Date  . Diabetes mellitus without complication ([place])   . Hypertension   . Renal disorder     Patient Active Problem List   Diagnosis Date Noted  . Fluid overload 06/09/2019    Past Surgical History:  Procedure Laterality Date  . AV FISTULA PLACEMENT Right      OB History   No obstetric history on file.     History reviewed. No pertinent family history.  Social History    Tobacco Use  . Smoking status: Never Smoker  . Smokeless tobacco: Never Used  Substance Use Topics  . Alcohol use: Not on file  . Drug use: Not Currently    Home Medications Prior to Admission medications   Medication Sig Start Date End Date Taking? Authorizing Provider  amLODipine (NORVASC) 5 MG tablet Take 5 mg by mouth daily. 05/31/19  Yes [provider]  calcium-vitamin D (OSCAL WITH D) 500-200 MG-UNIT tablet Take 1 tablet by mouth daily with breakfast.   Yes [provider]  PRESCRIPTION MEDICATION Inject 5 Units into the skin daily. Insulin comes from "[place]" however, patient does not know the name of the insulin. Patient states she took 5 units yesterday.   Yes [provider]    Allergies    Patient has no known allergies.  Review of Systems   Review of Systems  Cardiovascular: Positive for chest pain.  Gastrointestinal: Positive for vomiting.  All other systems reviewed and are negative.   Physical Exam Updated Vital Signs BP (!) 179/86 (BP Location: Left Arm)   Pulse 81   Temp 97.7 F (36.5 C) (Oral)   Resp 19   SpO2 97%   Physical Exam Vitals and nursing note reviewed.  Constitutional:      Appearance: She is well-developed.  HENT:     Head: Normocephalic and atraumatic.  Cardiovascular:     Rate and Rhythm: Regular rhythm. Tachycardia present.     Heart sounds: No murmur.  Pulmonary:     Effort:  No respiratory distress.     Comments: Tachypnea.  Fine crackles in all lung fields.  Abdominal:     Palpations: Abdomen is soft.     Tenderness: There is no abdominal tenderness. There is no guarding or rebound.  Musculoskeletal:        General: No tenderness.  Skin:    General: Skin is warm and dry.  Neurological:     Mental Status: She is alert and oriented to person, place, and time.     Comments: EOMI.  5/5 strength in all four extremities with sensation to light touch intact in all four extremities.    Psychiatric:         Behavior: Behavior normal.     ED Results / Procedures / Treatments   Labs (all labs ordered are listed, but only abnormal results are displayed) Labs Reviewed  COMPREHENSIVE METABOLIC PANEL - Abnormal; Notable for the following components:      Result Value   Sodium 125 (*)    Chloride 86 (*)    CO2 21 (*)    Glucose, Bld 117 (*)    BUN 102 (*)    Creatinine, Ser 5.77 (*)    Calcium 8.1 (*)    Total Protein 6.4 (*)    Alkaline Phosphatase 132 (*)    GFR calc non Af Amer 7 (*)    GFR calc Af Amer 8 (*)    Anion gap 18 (*)    All other components within normal limits  CBC WITH DIFFERENTIAL/PLATELET - Abnormal; Notable for the following components:   Hemoglobin 10.6 (*)    HCT 32.5 (*)    RDW 15.7 (*)    Lymphs Abs 0.5 (*)    All other components within normal limits  LIPASE, BLOOD - Abnormal; Notable for the following components:   Lipase 130 (*)    All other components within normal limits  I-STAT CHEM 8, ED - Abnormal; Notable for the following components:   Sodium 123 (*)    Chloride 87 (*)    BUN 107 (*)    Creatinine, Ser 6.20 (*)    Glucose, Bld 117 (*)    Calcium, Ion 0.90 (*)    Hemoglobin 11.2 (*)    HCT 33.0 (*)    All other components within normal limits  TROPONIN I (HIGH SENSITIVITY) - Abnormal; Notable for the following components:   Troponin I (High Sensitivity) 18 (*)    All other components within normal limits  TROPONIN I (HIGH SENSITIVITY) - Abnormal; Notable for the following components:   Troponin I (High Sensitivity) 23 (*)    All other components within normal limits  RESPIRATORY PANEL BY RT PCR (FLU A&B, COVID)  CULTURE, BLOOD (ROUTINE X 2)  CULTURE, BLOOD (ROUTINE X 2)  LACTIC ACID, PLASMA  LACTIC ACID, PLASMA  HEMOGLOBIN A1C  I-STAT CHEM 8, ED    EKG EKG Interpretation  Date/Time:  Monday June 09 2019 08:31:13 EST Ventricular Rate:  102 PR Interval:    QRS Duration: 91 QT Interval:  355 QTC Calculation: 463 R  Axis:   64 Text Interpretation: Sinus tachycardia Probable lateral infarct, age indeterminate no prior available for comparison Confirmed by Quintella Reichert [phone]) on 06/09/2019 8:34:26 AM   Radiology CT Abdomen Pelvis Wo Contrast  Result Date: 06/09/2019 CLINICAL DATA:  Nausea. Left-sided chest pain. End-stage renal disease on dialysis. EXAM: CT ABDOMEN AND PELVIS WITHOUT CONTRAST TECHNIQUE: Multidetector CT imaging of the abdomen and pelvis was performed following the standard protocol  without IV contrast. COMPARISON:  Chest radiographs 06/09/2019 and 06/07/2019. FINDINGS: Lower chest: Bilateral pleural effusions are noted. Moderate diffuse airspace disease is present. The heart is enlarged. Small pericardial effusion present. Hepatobiliary: No focal liver abnormality is seen. No gallstones, gallbladder wall thickening, or biliary dilatation. Pancreas: Unremarkable. No pancreatic ductal dilatation or surrounding inflammatory changes. Spleen: Normal in size without focal abnormality. Adrenals/Urinary Tract: Adrenal glands are normal bilaterally. Kidneys and ureters are unremarkable. No stone or mass lesion present. The urinary bladder is contracted, within normal limits. Stomach/Bowel: Stomach and duodenum are within normal limits. The small bowel unremarkable. Terminal ileum is normal. Appendix is visualized and normal. The ascending and transverse colon are within normal limits. Descending and sigmoid colon are normal. Vascular/Lymphatic: Atherosclerotic calcifications present the aorta and branch vessels without aneurysm. Reproductive: Uterus is calcified. Adnexa are unremarkable. Other: Small amount of free fluid is present. There is fluid about the spleen. There is some stranding in the mesentery as well as in the subcutaneous soft tissues. Musculoskeletal: Right femur ORIF is noted. Vertebral body heights and alignment are maintained. Focal lytic or blastic lesions are present. Degenerative changes are  present at both hips, right greater left. IMPRESSION: 1. Bilateral pleural effusions and diffuse airspace disease concerning for edema or infection. Airspace disease has increased over the last 2 days. 2. Cardiomegaly with a small pericardial effusion. 3. Small amount of free fluid about the spleen and within the subcutaneous soft tissues may be secondary to congestive heart failure. 4. Aortic Atherosclerosis (ICD10-I70.0). Electronically Signed   By: [place] M.D.   On: 06/09/2019 10:29   DG Chest Port 1 View  Result Date: 06/09/2019 CLINICAL DATA:  Chest pain, shortness of breath. EXAM: PORTABLE CHEST 1 VIEW COMPARISON:  Chest radiograph 06/07/2019 FINDINGS: Unchanged position of a right sided dialysis catheter with tip projecting in the region of the right atrium. Unchanged cardiomegaly. Again demonstrated are prominent bilateral interstitial airspace opacities. More conspicuous than on prior examination there are more ill-defined opacities within the bilateral lung bases. Trace right and small left pleural effusions new from prior examination no evidence of pneumothorax. No acute bony abnormality. Overlying cardiac monitoring leads. IMPRESSION: Redemonstrated bilateral interstitial opacities. New from prior exam, there are more ill-defined opacities within the bilateral lung bases. Findings may reflect interstitial/pulmonary edema or infection. Trace right with small left pleural effusions, new from prior exam. Unchanged cardiomegaly. Electronically Signed   By: Kellie Simmering DO   On: 06/09/2019 08:29    Procedures Procedures (including critical care time) CRITICAL CARE Performed by: Quintella Reichert   Total critical care time: 45 minutes  Critical care time was exclusive of separately billable procedures and treating other patients.  Critical care was necessary to treat or prevent imminent or life-threatening deterioration.  Critical care was time spent personally by me on the  following activities: development of treatment plan with patient and/or surrogate as well as nursing, discussions with consultants, evaluation of patient's response to treatment, examination of patient, obtaining history from patient or surrogate, ordering and performing treatments and interventions, ordering and review of laboratory studies, ordering and review of radiographic studies, pulse oximetry and re-evaluation of patient's condition.  Medications Ordered in ED Medications  Chlorhexidine Gluconate Cloth 2 % PADS 6 each (has no administration in time range)  pentafluoroprop-tetrafluoroeth (GEBAUERS) aerosol 1 application (has no administration in time range)  lidocaine (PF) (XYLOCAINE) 1 % injection 5 mL (has no administration in time range)  lidocaine-prilocaine (EMLA) cream 1 application (has no administration  in time range)  0.9 %  sodium chloride infusion (has no administration in time range)  0.9 %  sodium chloride infusion (has no administration in time range)  alteplase (CATHFLO ACTIVASE) injection 2 mg (has no administration in time range)  heparin injection 1,000 Units (has no administration in time range)  amLODipine (NORVASC) tablet 5 mg (has no administration in time range)  calcium-vitamin D (OSCAL WITH D) 500-200 MG-UNIT per tablet 1 tablet (has no administration in time range)  heparin injection 5,000 Units (has no administration in time range)  sodium chloride flush (NS) 0.9 % injection 3 mL (has no administration in time range)  ondansetron (ZOFRAN) tablet 4 mg (has no administration in time range)    Or  ondansetron (ZOFRAN) injection 4 mg (has no administration in time range)  acetaminophen (TYLENOL) tablet 650 mg (has no administration in time range)    Or  acetaminophen (TYLENOL) suppository 650 mg (has no administration in time range)  albuterol (PROVENTIL) (2.5 MG/3ML) 0.083% nebulizer solution 2.5 mg (has no administration in time range)  labetalol (NORMODYNE)  injection 10 mg (has no administration in time range)  insulin aspart (novoLOG) injection 0-6 Units (has no administration in time range)  ondansetron (ZOFRAN) injection 4 mg (4 mg Intravenous Given 06/09/19 1118)  heparin 1000 UNIT/ML injection (3,200 Units  Given 06/09/19 1534)    ED Course  I have reviewed the triage vital signs and the nursing notes.  Pertinent labs & imaging results that were available during my care of the patient were reviewed by me and considered in my medical decision making (see chart for details).    MDM Rules/Calculators/A&P                     Patient with ESR D on hemodialysis here for evaluation of vomiting, chest pain and shortness of breath. She is ill appearing on evaluation with tachypnea, crackles and appears uncomfortable. EKG with sinus tach, no additional ischemic changes. Chest x-ray with pulmonary edema. Current picture is not consistent with pneumonia. Patient will need dialysis due to volume overload, discussed with Dr. Hollie Salk with nephrology.  Troponin is mildly elevated, likely secondary to demand ischemia due to volume overload.  Hospitalist consulted for admission for further treatment.  Final Clinical Impression(s) / ED Diagnoses Final diagnoses:  ESRD (end stage renal disease) ([place])  Acute pulmonary edema ([facility])  Intractable vomiting with nausea, unspecified vomiting type  Precordial pain    Rx / DC Orders ED Discharge Orders    None       Quintella Reichert, MD 06/09/19 1639

## 2019-06-09 NOTE — Procedures (Signed)
Patient seen and examined on Hemodialysis. BP (!) 170/85 (BP Location: Left Arm)   Pulse 87   Temp 98 F (36.7 C) (Oral)   Resp 16   SpO2 98%   QB 400 mL/ min via R IJ TDC, UF goal 3L.    Tolerating treatment without complaints at this time.   Madelon Lips MD [facility] pgr [phone] 2:38 PM

## 2019-06-10 ENCOUNTER — Encounter (HOSPITAL_COMMUNITY): Payer: Self-pay | Admitting: Internal Medicine

## 2019-06-10 LAB — CBC
HCT: 31 % — ABNORMAL LOW (ref 36.0–46.0)
Hemoglobin: 9.9 g/dL — ABNORMAL LOW (ref 12.0–15.0)
MCH: 26.8 pg (ref 26.0–34.0)
MCHC: 31.9 g/dL (ref 30.0–36.0)
MCV: 84 fL (ref 80.0–100.0)
Platelets: 186 10*3/uL (ref 150–400)
RBC: 3.69 MIL/uL — ABNORMAL LOW (ref 3.87–5.11)
RDW: 15.9 % — ABNORMAL HIGH (ref 11.5–15.5)
WBC: 5.6 10*3/uL (ref 4.0–10.5)
nRBC: 0 % (ref 0.0–0.2)

## 2019-06-10 LAB — RENAL FUNCTION PANEL
Albumin: 2.8 g/dL — ABNORMAL LOW (ref 3.5–5.0)
Anion gap: 10 (ref 5–15)
BUN: 28 mg/dL — ABNORMAL HIGH (ref 8–23)
CO2: 27 mmol/L (ref 22–32)
Calcium: 7.3 mg/dL — ABNORMAL LOW (ref 8.9–10.3)
Chloride: 94 mmol/L — ABNORMAL LOW (ref 98–111)
Creatinine, Ser: 2.99 mg/dL — ABNORMAL HIGH (ref 0.44–1.00)
GFR calc Af Amer: 18 mL/min — ABNORMAL LOW (ref >60)
GFR calc non Af Amer: 15 mL/min — ABNORMAL LOW (ref >60)
Glucose, Bld: 82 mg/dL (ref 70–99)
Phosphorus: 4.7 mg/dL — ABNORMAL HIGH (ref 2.5–4.6)
Potassium: 4.1 mmol/L (ref 3.5–5.1)
Sodium: 131 mmol/L — ABNORMAL LOW (ref 135–145)

## 2019-06-10 LAB — GLUCOSE, CAPILLARY
Glucose-Capillary: 115 mg/dL — ABNORMAL HIGH (ref 70–99)
Glucose-Capillary: 111 mg/dL — ABNORMAL HIGH (ref 70–99)
Glucose-Capillary: 115 mg/dL — ABNORMAL HIGH (ref 70–99)
Glucose-Capillary: 132 mg/dL — ABNORMAL HIGH (ref 70–99)

## 2019-06-10 MED ORDER — HEPARIN SODIUM (PORCINE) 1000 UNIT/ML IJ SOLN
INTRAMUSCULAR | Status: AC
  Filled 2019-06-10: qty 4

## 2019-06-10 NOTE — Progress Notes (Signed)
PROGRESS NOTE    Amber Dixon  SJG:[id number] DOB: [birth year] DOA: 06/09/2019 PCP: Patient, No Pcp Per   Brief Narrative:  HPI: Amber Dixon is a 69 y.o. female with medical history significant of ESRD on HD(TTS), hypertension, and diabetes mellitus type 2, presents with complaints of nausea and vomiting over the last 2 days.  She is obtained with the use of translator services.  Previously evaluated in emergency department 2 days ago with complaints of frontal headache.  At that time she reported having nausea and vomiting as well.  CT scan of the brain did not show any acute abnormalities and patient received Reglan and Benadryl with some improvement in symptoms.  States that her symptoms of headache, nausea, and vomiting returned shortly thereafter being discharged home.  Missed her hemodialysis on Saturday due to being in the emergency department.  He has been unable to keep any significant food or liquids down.  Emesis is noted to be nonbloody and watery this time.  Associated symptoms include complaints of left-sided chest pain and shortness of breath.  Denies any abdominal pain, fever, or cough.   Assessment & Plan:   Principal Problem:   Fluid overload Active Problems:   Chest pain   Elevated troponin   Diabetes mellitus type 2 in nonobese (HCC)   Anemia of chronic disease   Nausea and vomiting   Pulmonary edema  Acute respiratory failure with hypoxia secondary to fluid overload with ESRD on HD:  -resolved after HD, will resume normal schedule today with dialysis -D/C telemetry -Fluid restriction -Appreciate nephrology, will follow-up for any further recommendations  Chest pain, elevated troponin:  -patient reported initially but this is resolved -now states pain only with vomiting in her chest region -initial high-sensitivity troponin 18, but suspect secondary to demand.  -Continue to monitor  Nausea and vomiting:  -still with persistent  N/V, non-bilious, no blood -lipase 102 which is not convincing for pancreatitis and CT scan negative for etiology -ordered gastric emptying today to assess -has not had improvement with HD yesterday will assess after HD again today -zofran prn -this is limiting oral intake  Hypertensive urgency:  -Continue amlodipine -could be causing some nausea as BP still high today although down some after HD last night -Labetalol IV as needed  Diabetes mellitus type 2: Patient reports being on 5 to 6 units of insulin at home. -Hypoglycemic protocol -Check hemoglobin A1c -CBGs before every meal with very sensitive SSI  Anemia of chronic kidney disease: Hemoglobin 10.6 which appears near patient's baseline. -Continue to monitor  DVT prophylaxis: Heparin Code Status: full Family Communication: patient only through interpretor Disposition Plan:  . Patient came from:home            . Anticipated d/c place:home . Barriers to d/c OR conditions which need to be met to effect a safe d/c:needs gastric emptying study to assess for nausea and vomiting, needs control with medication of nausea/vomiting for safe D/C home   Consultants:   nephrology  Antimicrobials:   none  Subjective: Information through interpretor only. Patient is feeling bad today with lot of nausea and vomiting. Is not sure if she has gotten any medicine for nausea but if so has not helped. For last 3 days she is unable to keep any food down at home. Denies pain in stomach. Denies diarrhea. Has not been having BM due to not eating. Denies fevers or chills. Covid-19 negative in ER and flu negative. Denies respiratory symptoms. Breathing is  better now that she had HD last night. Is not able to eat or drink anything.   Objective: Vitals:   06/09/19 2149 06/09/19 2150 06/10/19 0542 06/10/19 0900  BP: (!) 187/83 (!) 178/83 (!) 173/72 (!) 186/83  Pulse: 92 92 79 93  Resp: 18 16 18 20   Temp: 99 F (37.2 C) 99 F (37.2 C) 98.7 F  (37.1 C) 98 F (36.7 C)  TempSrc: Oral Oral Oral Oral  SpO2:  99% 100% 98%  Height:        Intake/Output Summary (Last 24 hours) at 06/10/2019 9379 Last data filed at 06/09/2019 2200 Gross per 24 hour  Intake 420 ml  Output 0 ml  Net 420 ml   Filed Weights    Examination:  General exam: Appears calm and comfortable, nauseous and vomiting during visit Respiratory system: Clear to auscultation. Respiratory effort normal. Cardiovascular system: S1 & S2 heard, RRR. No JVD, murmurs, rubs, gallops or clicks. No pedal edema. Gastrointestinal system: Abdomen is nondistended, soft and nontender. No organomegaly or masses felt. Normal bowel sounds heard. Central nervous system: Alert and oriented. No focal neurological deficits. Extremities: Symmetric 5 x 5 power. Skin: No rashes, lesions or ulcers Psychiatry: Judgement and insight appear normal. Mood & affect appropriate.   Data Reviewed: I have personally reviewed following labs and imaging studies  CBC: Recent Labs  Lab 06/07/19 1300 06/09/19 0841 06/09/19 0842 06/10/19 0550  WBC 6.5 7.6  --  5.6  NEUTROABS  --  6.6  --   --   HGB 10.2* 10.6* 11.2* 9.9*  HCT 32.2* 32.5* 33.0* 31.0*  MCV 84.5 82.1  --  84.0  PLT 267 244  --  024   Basic Metabolic Panel: Recent Labs  Lab 06/07/19 1156 06/09/19 0841 06/09/19 0842 06/10/19 0550  NA 128* 125* 123* 131*  K 5.0 4.8 4.7 4.1  CL 91* 86* 87* 94*  CO2 23 21*  --  27  GLUCOSE 90 117* 117* 82  BUN 63* 102* 107* 28*  CREATININE 3.72* 5.77* 6.20* 2.99*  CALCIUM 7.8* 8.1*  --  7.3*  PHOS  --   --   --  4.7*   GFR: Estimated Creatinine Clearance: 11.9 mL/min (A) (by C-G formula based on SCr of 2.99 mg/dL (H)). Liver Function Tests: Recent Labs  Lab 06/09/19 0841 06/10/19 0550  AST 33  --   ALT 29  --   ALKPHOS 132*  --   BILITOT 0.7  --   PROT 6.4*  --   ALBUMIN 3.5 2.8*   Recent Labs  Lab 06/09/19 0841  LIPASE 130*   No results for input(s): AMMONIA in the last  168 hours. Coagulation Profile: No results for input(s): INR, PROTIME in the last 168 hours. Cardiac Enzymes: No results for input(s): CKTOTAL, CKMB, CKMBINDEX, TROPONINI in the last 168 hours. BNP (last 3 results) No results for input(s): PROBNP in the last 8760 hours. HbA1C: Recent Labs    06/09/19 1559  HGBA1C 4.9   CBG: Recent Labs  Lab 06/09/19 1628 06/10/19 0718  GLUCAP 172* 115*   Lipid Profile: No results for input(s): CHOL, HDL, LDLCALC, TRIG, CHOLHDL, LDLDIRECT in the last 72 hours. Thyroid Function Tests: No results for input(s): TSH, T4TOTAL, FREET4, T3FREE, THYROIDAB in the last 72 hours. Anemia Panel: No results for input(s): VITAMINB12, FOLATE, FERRITIN, TIBC, IRON, RETICCTPCT in the last 72 hours. Sepsis Labs: Recent Labs  Lab 06/09/19 0856 06/09/19 1112  LATICACIDVEN 0.6 0.7    Recent Results (  from the past 240 hour(s))  Respiratory Panel by RT PCR (Flu A&B, Covid) - Nasopharyngeal Swab     Status: None   Collection Time: 06/09/19  8:27 AM   Specimen: Nasopharyngeal Swab  Result Value Ref Range Status   SARS Coronavirus 2 by RT PCR NEGATIVE NEGATIVE Final    Comment: (NOTE) SARS-CoV-2 target nucleic acids are NOT DETECTED. The SARS-CoV-2 RNA is generally detectable in upper respiratoy specimens during the acute phase of infection. The lowest concentration of SARS-CoV-2 viral copies this assay can detect is 131 copies/mL. A negative result does not preclude SARS-Cov-2 infection and should not be used as the sole basis for treatment or other patient management decisions. A negative result may occur with  improper specimen collection/handling, submission of specimen other than nasopharyngeal swab, presence of viral mutation(s) within the areas targeted by this assay, and inadequate number of viral copies (<131 copies/mL). A negative result must be combined with clinical observations, patient history, and epidemiological information. The expected  result is Negative. Fact Sheet for Patients:  PinkCheek.be Fact Sheet for Healthcare Providers:  GravelBags.it This test is not yet ap proved or cleared by the [place] FDA and  has been authorized for detection and/or diagnosis of SARS-CoV-2 by FDA under an Emergency Use Authorization (EUA). This EUA will remain  in effect (meaning this test can be used) for the duration of the COVID-19 declaration under Section 564(b)(1) of the Act, 21 U.S.C. section 360bbb-3(b)(1), unless the authorization is terminated or revoked sooner.    Influenza A by PCR NEGATIVE NEGATIVE Final   Influenza B by PCR NEGATIVE NEGATIVE Final    Comment: (NOTE) The Xpert Xpress SARS-CoV-2/FLU/RSV assay is intended as an aid in  the diagnosis of influenza from Nasopharyngeal swab specimens and  should not be used as a sole basis for treatment. Nasal washings and  aspirates are unacceptable for Xpert Xpress SARS-CoV-2/FLU/RSV  testing. Fact Sheet for Patients: PinkCheek.be Fact Sheet for Healthcare Providers: GravelBags.it This test is not yet approved or cleared by the [place] FDA and  has been authorized for detection and/or diagnosis of SARS-CoV-2 by  FDA under an Emergency Use Authorization (EUA). This EUA will remain  in effect (meaning this test can be used) for the duration of the  Covid-19 declaration under Section 564(b)(1) of the Act, 21  U.S.C. section 360bbb-3(b)(1), unless the authorization is  terminated or revoked. Performed at [facility], [place] [address], [place], [place] [address]   Culture, blood (routine x 2)     Status: None (Preliminary result)   Collection Time: 06/09/19  9:17 AM   Specimen: BLOOD  Result Value Ref Range Status   Specimen Description BLOOD LEFT ANTECUBITAL  Final   Special Requests   Final    BOTTLES DRAWN AEROBIC ONLY Blood Culture  adequate volume   Culture   Final    NO GROWTH < 24 HOURS Performed at [facility], [place] [address] [place], [place] [address]    Report Status PENDING  Incomplete  Culture, blood (routine x 2)     Status: None (Preliminary result)   Collection Time: 06/09/19  9:17 AM   Specimen: BLOOD LEFT ARM  Result Value Ref Range Status   Specimen Description BLOOD LEFT ARM  Final   Special Requests   Final    BOTTLES DRAWN AEROBIC AND ANAEROBIC Blood Culture adequate volume   Culture   Final    NO GROWTH < 24 HOURS Performed at [facility]  [facility], [place] [address] [place], [place] [address]    Report Status PENDING  Incomplete    Radiology Studies: CT Abdomen Pelvis Wo Contrast  Result Date: 06/09/2019 CLINICAL DATA:  Nausea. Left-sided chest pain. End-stage renal disease on dialysis. EXAM: CT ABDOMEN AND PELVIS WITHOUT CONTRAST TECHNIQUE: Multidetector CT imaging of the abdomen and pelvis was performed following the standard protocol without IV contrast. COMPARISON:  Chest radiographs 06/09/2019 and 06/07/2019. FINDINGS: Lower chest: Bilateral pleural effusions are noted. Moderate diffuse airspace disease is present. The heart is enlarged. Small pericardial effusion present. Hepatobiliary: No focal liver abnormality is seen. No gallstones, gallbladder wall thickening, or biliary dilatation. Pancreas: Unremarkable. No pancreatic ductal dilatation or surrounding inflammatory changes. Spleen: Normal in size without focal abnormality. Adrenals/Urinary Tract: Adrenal glands are normal bilaterally. Kidneys and ureters are unremarkable. No stone or mass lesion present. The urinary bladder is contracted, within normal limits. Stomach/Bowel: Stomach and duodenum are within normal limits. The small bowel unremarkable. Terminal ileum is normal. Appendix is visualized and normal. The ascending and transverse colon are within normal limits. Descending and sigmoid colon are normal. Vascular/Lymphatic:  Atherosclerotic calcifications present the aorta and branch vessels without aneurysm. Reproductive: Uterus is calcified. Adnexa are unremarkable. Other: Small amount of free fluid is present. There is fluid about the spleen. There is some stranding in the mesentery as well as in the subcutaneous soft tissues. Musculoskeletal: Right femur ORIF is noted. Vertebral body heights and alignment are maintained. Focal lytic or blastic lesions are present. Degenerative changes are present at both hips, right greater left. IMPRESSION: 1. Bilateral pleural effusions and diffuse airspace disease concerning for edema or infection. Airspace disease has increased over the last 2 days. 2. Cardiomegaly with a small pericardial effusion. 3. Small amount of free fluid about the spleen and within the subcutaneous soft tissues may be secondary to congestive heart failure. 4. Aortic Atherosclerosis (ICD10-I70.0). Electronically Signed   By: San Morelle M.D.   On: 06/09/2019 10:29   DG Chest Port 1 View  Result Date: 06/09/2019 CLINICAL DATA:  Chest pain, shortness of breath. EXAM: PORTABLE CHEST 1 VIEW COMPARISON:  Chest radiograph 06/07/2019 FINDINGS: Unchanged position of a right sided dialysis catheter with tip projecting in the region of the right atrium. Unchanged cardiomegaly. Again demonstrated are prominent bilateral interstitial airspace opacities. More conspicuous than on prior examination there are more ill-defined opacities within the bilateral lung bases. Trace right and small left pleural effusions new from prior examination no evidence of pneumothorax. No acute bony abnormality. Overlying cardiac monitoring leads. IMPRESSION: Redemonstrated bilateral interstitial opacities. New from prior exam, there are more ill-defined opacities within the bilateral lung bases. Findings may reflect interstitial/pulmonary edema or infection. Trace right with small left pleural effusions, new from prior exam. Unchanged  cardiomegaly. Electronically Signed   By: Kellie Simmering DO   On: 06/09/2019 08:29   Scheduled Meds: . amLODipine  5 mg Oral Daily  . calcium-vitamin D  1 tablet Oral Q breakfast  . Chlorhexidine Gluconate Cloth  6 each Topical Q0600  . heparin  5,000 Units Subcutaneous Q8H  . insulin aspart  0-6 Units Subcutaneous TID WC  . sodium chloride flush  3 mL Intravenous Q12H   Continuous Infusions: . sodium chloride    . sodium chloride      LOS: 1 day   Time spent: McPherson, MD [facility]  To contact the attending provider between 7A-7P or the covering provider during after hours 7P-7A, please  log into the web site [email or web address] and access using universal [place] password for that web site. If you do not have the password, please call the hospital operator.  06/10/2019, 9:55 AM

## 2019-06-10 NOTE — Progress Notes (Signed)
[facility] Progress Note   Subjective:  Seen in room - used Marketing executive. Dyspnea much improved - now on room air. Unfortunately, still actively vomiting today - mostly dry heaves. Denies any new symptoms - no abd pain, fever, chills, diarrhea. Abd CT yesterday without acute findings aside from her volume overload.  Objective Vitals:   06/09/19 2149 06/09/19 2150 06/10/19 0542 06/10/19 0900  BP: (!) 187/83 (!) 178/83 (!) 173/72 (!) 186/83  Pulse: 92 92 79 93  Resp: 18 16 18 20   Temp: 99 F (37.2 C) 99 F (37.2 C) 98.7 F (37.1 C) 98 F (36.7 C)  TempSrc: Oral Oral Oral Oral  SpO2:  99% 100% 98%  Height:       Physical Exam General: Frail woman, actively vomiting. Heart: Tachycardic, 2/6 murmur Lungs: Coarse throughout without clear wheezing or rales Abdomen: soft, non-tender Extremities: No LE edema Dialysis Access:  TDC in R chest, RUE AVF  Additional Objective Labs: Basic Metabolic Panel: Recent Labs  Lab 06/07/19 1156 06/07/19 1156 06/09/19 0841 06/09/19 0842 06/10/19 0550  NA 128*   < > 125* 123* 131*  K 5.0   < > 4.8 4.7 4.1  CL 91*   < > 86* 87* 94*  CO2 23  --  21*  --  27  GLUCOSE 90   < > 117* 117* 82  BUN 63*   < > 102* 107* 28*  CREATININE 3.72*   < > 5.77* 6.20* 2.99*  CALCIUM 7.8*  --  8.1*  --  7.3*  PHOS  --   --   --   --  4.7*   < > = values in this interval not displayed.   Liver Function Tests: Recent Labs  Lab 06/09/19 0841 06/10/19 0550  AST 33  --   ALT 29  --   ALKPHOS 132*  --   BILITOT 0.7  --   PROT 6.4*  --   ALBUMIN 3.5 2.8*   Recent Labs  Lab 06/09/19 0841  LIPASE 130*   CBC: Recent Labs  Lab 06/07/19 1300 06/07/19 1300 06/09/19 0841 06/09/19 0842 06/10/19 0550  WBC 6.5  --  7.6  --  5.6  NEUTROABS  --   --  6.6  --   --   HGB 10.2*   < > 10.6* 11.2* 9.9*  HCT 32.2*   < > 32.5* 33.0* 31.0*  MCV 84.5  --  82.1  --  84.0  PLT 267  --  244  --  186   < > = values in this interval not  displayed.   Blood Culture    Component Value Date/Time   SDES BLOOD LEFT ANTECUBITAL 06/09/2019 0917   SDES BLOOD LEFT ARM 06/09/2019 0917   SPECREQUEST  06/09/2019 0917    BOTTLES DRAWN AEROBIC ONLY Blood Culture adequate volume   SPECREQUEST  06/09/2019 0917    BOTTLES DRAWN AEROBIC AND ANAEROBIC Blood Culture adequate volume   CULT  06/09/2019 0917    NO GROWTH < 24 HOURS Performed at [facility], [place] [address], [place], [place] [address]    CULT  06/09/2019 [phone]    NO GROWTH < 24 HOURS Performed at [facility], [place] [address], [place], [place] [address]    REPTSTATUS PENDING 06/09/2019 [id number]   REPTSTATUS PENDING 06/09/2019 [id number]   Studies/Results: CT Abdomen Pelvis Wo Contrast  Result Date: 06/09/2019 CLINICAL DATA:  Nausea. Left-sided chest pain. End-stage renal disease on dialysis.  EXAM: CT ABDOMEN AND PELVIS WITHOUT CONTRAST TECHNIQUE: Multidetector CT imaging of the abdomen and pelvis was performed following the standard protocol without IV contrast. COMPARISON:  Chest radiographs 06/09/2019 and 06/07/2019. FINDINGS: Lower chest: Bilateral pleural effusions are noted. Moderate diffuse airspace disease is present. The heart is enlarged. Small pericardial effusion present. Hepatobiliary: No focal liver abnormality is seen. No gallstones, gallbladder wall thickening, or biliary dilatation. Pancreas: Unremarkable. No pancreatic ductal dilatation or surrounding inflammatory changes. Spleen: Normal in size without focal abnormality. Adrenals/Urinary Tract: Adrenal glands are normal bilaterally. Kidneys and ureters are unremarkable. No stone or mass lesion present. The urinary bladder is contracted, within normal limits. Stomach/Bowel: Stomach and duodenum are within normal limits. The small bowel unremarkable. Terminal ileum is normal. Appendix is visualized and normal. The ascending and transverse colon are within normal limits. Descending and sigmoid colon are normal.  Vascular/Lymphatic: Atherosclerotic calcifications present the aorta and branch vessels without aneurysm. Reproductive: Uterus is calcified. Adnexa are unremarkable. Other: Small amount of free fluid is present. There is fluid about the spleen. There is some stranding in the mesentery as well as in the subcutaneous soft tissues. Musculoskeletal: Right femur ORIF is noted. Vertebral body heights and alignment are maintained. Focal lytic or blastic lesions are present. Degenerative changes are present at both hips, right greater left. IMPRESSION: 1. Bilateral pleural effusions and diffuse airspace disease concerning for edema or infection. Airspace disease has increased over the last 2 days. 2. Cardiomegaly with a small pericardial effusion. 3. Small amount of free fluid about the spleen and within the subcutaneous soft tissues may be secondary to congestive heart failure. 4. Aortic Atherosclerosis (ICD10-I70.0). Electronically Signed   By: San Morelle M.D.   On: 06/09/2019 10:29   DG Chest Port 1 View  Result Date: 06/09/2019 CLINICAL DATA:  Chest pain, shortness of breath. EXAM: PORTABLE CHEST 1 VIEW COMPARISON:  Chest radiograph 06/07/2019 FINDINGS: Unchanged position of a right sided dialysis catheter with tip projecting in the region of the right atrium. Unchanged cardiomegaly. Again demonstrated are prominent bilateral interstitial airspace opacities. More conspicuous than on prior examination there are more ill-defined opacities within the bilateral lung bases. Trace right and small left pleural effusions new from prior examination no evidence of pneumothorax. No acute bony abnormality. Overlying cardiac monitoring leads. IMPRESSION: Redemonstrated bilateral interstitial opacities. New from prior exam, there are more ill-defined opacities within the bilateral lung bases. Findings may reflect interstitial/pulmonary edema or infection. Trace right with small left pleural effusions, new from prior  exam. Unchanged cardiomegaly. Electronically Signed   By: Kellie Simmering DO   On: 06/09/2019 08:29   Medications: . sodium chloride    . sodium chloride     . amLODipine  5 mg Oral Daily  . calcium-vitamin D  1 tablet Oral Q breakfast  . Chlorhexidine Gluconate Cloth  6 each Topical Q0600  . heparin  5,000 Units Subcutaneous Q8H  . insulin aspart  0-6 Units Subcutaneous TID WC  . sodium chloride flush  3 mL Intravenous Q12H    Dialysis Orders: TTS at [facility] -> missed 2/13, last HD 2/11 4hr, 400/800, EDW 32.4kg, 2K/2.25Ca, TDC, heparin 2000 bolus - Venofer 100 x 10 ordered - Mircera 100 q 2 weeks (last 1/19) - Hgb 10.5 on 2/11 - Hectoral 38mcg IV q HD  Assessment/Plan: 1.  Pulm edema: S/p HD on 2/15 - 2.5L net UF. Dyspnea much improved today. 2. Vomiting: Unclear cause at this time. Initial cardiac work-up (Trop/EKG) ok. Lipase high, but abd  CT without pancreatitis or other acute abnormalities aside from fluid overload. BCx negative so far. Getting IV zofran - defer to hospitalist for further evaluation. 3.  ESRD: Usual TTS sched  - s/p HD yesterday for volume - short HD later today to get back on usual schedule. 4.  Hypertension/volume: BP remains high - further UF today as tolerated. 5.  Anemia: Hgb 11.2 -> 9.9, sizeable drop. Follow for now. 6.  Metabolic bone disease: CorrCa/Phos ok - follow for now.  Veneta Penton, PA-C 06/10/2019, 9:55 AM  [facility] Pager: [phone]

## 2019-06-10 NOTE — Progress Notes (Signed)
Per nuclear med, Gastric emptying imaging will not be done today but on 2/17 and pt will need to be NPO after midnight. MD Crawford aware.   Paulla Fore, RN, BSN

## 2019-06-11 ENCOUNTER — Ambulatory Visit: Payer: Self-pay | Admitting: Family Medicine

## 2019-06-11 ENCOUNTER — Inpatient Hospital Stay (HOSPITAL_COMMUNITY): Payer: Self-pay

## 2019-06-11 DIAGNOSIS — J810 Acute pulmonary edema: Secondary | ICD-10-CM

## 2019-06-11 DIAGNOSIS — N186 End stage renal disease: Secondary | ICD-10-CM

## 2019-06-11 LAB — CBC
HCT: 33.7 % — ABNORMAL LOW (ref 36.0–46.0)
Hemoglobin: 10.6 g/dL — ABNORMAL LOW (ref 12.0–15.0)
MCH: 26.6 pg (ref 26.0–34.0)
MCHC: 31.5 g/dL (ref 30.0–36.0)
MCV: 84.7 fL (ref 80.0–100.0)
Platelets: 213 10*3/uL (ref 150–400)
RBC: 3.98 MIL/uL (ref 3.87–5.11)
RDW: 15.9 % — ABNORMAL HIGH (ref 11.5–15.5)
WBC: 4.8 10*3/uL (ref 4.0–10.5)
nRBC: 0 % (ref 0.0–0.2)

## 2019-06-11 LAB — GLUCOSE, CAPILLARY
Glucose-Capillary: 78 mg/dL (ref 70–99)
Glucose-Capillary: 88 mg/dL (ref 70–99)
Glucose-Capillary: 165 mg/dL — ABNORMAL HIGH (ref 70–99)
Glucose-Capillary: 92 mg/dL (ref 70–99)

## 2019-06-11 MED ORDER — TECHNETIUM TC 99M SULFUR COLLOID
2.0000 | Freq: Once | INTRAVENOUS | Status: AC | PRN
  Administered 2019-06-11: 2 via INTRAVENOUS

## 2019-06-11 MED ORDER — AMLODIPINE BESYLATE 10 MG PO TABS
10.0000 mg | ORAL_TABLET | Freq: Every day | ORAL | Status: DC
  Administered 2019-06-12: 10 mg via ORAL
  Filled 2019-06-11: qty 1

## 2019-06-11 NOTE — Progress Notes (Deleted)
Pt refused CBG check at this time. Will reassess at a later time. Call light with in reach.

## 2019-06-11 NOTE — Progress Notes (Signed)
[facility] Progress Note   Subjective:  Seen in room. Has a different family member with her who speaks some English and is able to translate. Last vomited last night, but has not eaten much today so far. No CP/dyspnea. S/p gastric emptying test this AM which was normal.   Objective Vitals:   06/10/19 2016 06/11/19 0541 06/11/19 0800 06/11/19 1238  BP: (!) 184/74 (!) 182/76 (!) 170/71 (!) 196/87  Pulse: 88 80 82 86  Resp: 19 16 15    Temp: 99.3 F (37.4 C) 99.3 F (37.4 C) 100.3 F (37.9 C) 98.8 F (37.1 C)  TempSrc: Oral Oral Oral Oral  SpO2: 94% 98% 93% 96%  Weight: 32 kg     Height:       Physical Exam General: Frail woman, NAD Heart: Tachycardic, 2/6 murmur Lungs: CTAB Abdomen: soft, non-tender Extremities: No LE edema Dialysis Access:  TDC in R chest, RUE AVF  Additional Objective Labs: Basic Metabolic Panel: Recent Labs  Lab 06/07/19 1156 06/07/19 1156 06/09/19 0841 06/09/19 0842 06/10/19 0550  NA 128*   < > 125* 123* 131*  K 5.0   < > 4.8 4.7 4.1  CL 91*   < > 86* 87* 94*  CO2 23  --  21*  --  27  GLUCOSE 90   < > 117* 117* 82  BUN 63*   < > 102* 107* 28*  CREATININE 3.72*   < > 5.77* 6.20* 2.99*  CALCIUM 7.8*  --  8.1*  --  7.3*  PHOS  --   --   --   --  4.7*   < > = values in this interval not displayed.   Liver Function Tests: Recent Labs  Lab 06/09/19 0841 06/10/19 0550  AST 33  --   ALT 29  --   ALKPHOS 132*  --   BILITOT 0.7  --   PROT 6.4*  --   ALBUMIN 3.5 2.8*   Recent Labs  Lab 06/09/19 0841  LIPASE 130*   CBC: Recent Labs  Lab 06/07/19 1300 06/07/19 1300 06/09/19 0841 06/09/19 0841 06/09/19 0842 06/10/19 0550 06/11/19 0639  WBC 6.5   < > 7.6  --   --  5.6 4.8  NEUTROABS  --   --  6.6  --   --   --   --   HGB 10.2*   < > 10.6*   < > 11.2* 9.9* 10.6*  HCT 32.2*   < > 32.5*   < > 33.0* 31.0* 33.7*  MCV 84.5  --  82.1  --   --  84.0 84.7  PLT 267   < > 244  --   --  186 213   < > = values in this  interval not displayed.   Blood Culture    Component Value Date/Time   SDES BLOOD LEFT ANTECUBITAL 06/09/2019 0917   SDES BLOOD LEFT ARM 06/09/2019 0917   SPECREQUEST  06/09/2019 0917    BOTTLES DRAWN AEROBIC ONLY Blood Culture adequate volume   SPECREQUEST  06/09/2019 0917    BOTTLES DRAWN AEROBIC AND ANAEROBIC Blood Culture adequate volume   CULT  06/09/2019 0917    NO GROWTH 2 DAYS Performed at [facility], [place] [address], [place], [place] [address]    CULT  06/09/2019 [id number]    NO GROWTH 2 DAYS Performed at [facility], [place] [address] [place], [place] [address]    REPTSTATUS PENDING 06/09/2019 [phone]  REPTSTATUS PENDING 06/09/2019 [id number]   Studies/Results: NM GASTRIC EMPTYING  Result Date: 06/11/2019 CLINICAL DATA:  Nausea and vomiting EXAM: NUCLEAR MEDICINE GASTRIC EMPTYING SCAN TECHNIQUE: After oral ingestion of radiolabeled meal, sequential abdominal images were obtained for 120 minutes. Residual percentage of activity remaining within the stomach was calculated at 60 and 120 minutes. RADIOPHARMACEUTICALS:  2.0 mCi Tc-43m sulfur colloid in standardized meal including egg COMPARISON:  None. FINDINGS: Expected location of the stomach in the left upper quadrant. Ingested meal empties the stomach gradually over the course of the study with 17.6% retention at 60 min and 1.7% retention at 120 min (normal retention less than 30% at a 120 min). IMPRESSION: Normal gastric emptying study. Electronically Signed   By: Lowella Grip III M.D.   On: 06/11/2019 12:39   Medications: . sodium chloride    . sodium chloride     . [START ON 06/12/2019] amLODipine  10 mg Oral Daily  . calcium-vitamin D  1 tablet Oral Q breakfast  . Chlorhexidine Gluconate Cloth  6 each Topical Q0600  . heparin  5,000 Units Subcutaneous Q8H  . insulin aspart  0-6 Units Subcutaneous TID WC  . sodium chloride flush  3 mL Intravenous Q12H    Dialysis Orders: TTS at [facility] -> missed 2/13, last HD  2/11 4hr, 400/800, EDW 32.4kg, 2K/2.25Ca, TDC, heparin 2000 bolus - Venofer 100 x 10 ordered - Mircera 100 q 2 weeks (last 1/19) - Hgb 10.5 on 2/11 - Hectoral 1mcg IV q HD  Assessment/Plan: 1. Pulm edema: S/p HD on 2/15 - 2.5L net UF. Dyspnea improved/resolved. 2. Vomiting: Unclear cause at this time. Initial cardiac work-up (Trop/EKG) ok. Lipase high, but abd CT without pancreatitis or other acute abnormalities aside from fluid overload. BCx negative. Gastric emptying study negative. DDx: viral, GERD, angina equiv? 3. ESRD:Back to TTS schedule, next tomorrow (either here or as outpt). 4. Hypertension/volume:BP remains high - doesn't appear overloaded at this time, although will continue to challenge EDW prn. On amlod alone -> dose just increased. Could add BB too.  5. Anemia:Hgb 10.6. Follow for now. 6. Metabolic bone disease:CorrCa/Phos ok - follow for now.   Veneta Penton, PA-C 06/11/2019, 2:55 PM  [facility] Pager: [phone]

## 2019-06-11 NOTE — Progress Notes (Signed)
PROGRESS NOTE    Amber Dixon  [id number] DOB: 15-Feb-1951 DOA: 06/09/2019 PCP: Patient, No Pcp Per   Brief Narrative:  Amber Dixon a 69 y.o.femalewith medical history significant ofESRD on HD(TTS), hypertension, and diabetes mellitus type 2, presents with complaints of nausea and vomiting over the last 2 days. She is obtained with the use of translator services. Previously evaluated in emergency department 2 days ago with complaints of frontal headache. At that time she reported having nausea and vomiting as well. CT scan of the brain did not show any acute abnormalities and patient received Reglan and Benadryl with some improvement in symptoms. States that her symptoms of headache, nausea, and vomiting returned shortly thereafter being discharged home.Missedher hemodialysis on Saturday due to being in the emergency department. He has been unable to keep any significant food or liquids down. Emesis is noted to be nonbloody and watery this time. Associated symptoms include complaints of left-sided chest pain and shortness of breath. Denies any abdominal pain, fever, or cough.  Subjective: Patient continued to experience some nausea and vomiting.  She is going to get her gastric emptying studies today.  Assessment & Plan:   Principal Problem:   Fluid overload Active Problems:   Chest pain   Elevated troponin   Diabetes mellitus type 2 in nonobese (HCC)   Anemia of chronic disease   Nausea and vomiting   Pulmonary edema  Acute respiratory failure with hypoxia secondaryto fluidoverload with ESRD on HD:  Resolved. -Continue dialysis according to her schedule. -Nephrology is following-appreciate their help.  Nausea and vomiting:  There is concern for gastroparesis.  Had her gastric emptying studies today which were negative for any gastric delay. CT abdomen was negative for pancreatitis. -Continue Zofran as needed and  monitor.  Hypertension.  Blood pressure remained elevated.  Might be contributing to her nausea and vomiting. -Increase amlodipine to 10 mg daily. -Continue labetalol as needed.  Type 2 diabetes mellitus.  A1c of 4.9.  According to her that she is not diabetic. -Continue to monitor and SSI as needed.  Anemia of chronic disease.  Hemoglobin stable around 10.6.  Chest pain with mildly elevated troponin.  Resolved.  Most likely secondary to demand.  Objective: Vitals:   06/10/19 1731 06/10/19 1836 06/10/19 2016 06/11/19 0541  BP:  (!) 185/77 (!) 184/74 (!) 182/76  Pulse:  92 88 80  Resp:   19 16  Temp: 98.7 F (37.1 C)  99.3 F (37.4 C) 99.3 F (37.4 C)  TempSrc: Oral  Oral Oral  SpO2:   94% 98%  Weight:   32 kg   Height:        Intake/Output Summary (Last 24 hours) at 06/11/2019 0747 Last data filed at 06/10/2019 1727 Gross per 24 hour  Intake 0 ml  Output 2060 ml  Net -2060 ml   Filed Weights   06/10/19 1630 06/10/19 2016  Weight: 31.1 kg 32 kg    Examination:  General exam: Appears calm and comfortable  Respiratory system: Clear to auscultation. Respiratory effort normal. Cardiovascular system: S1 & S2 heard, RRR. No JVD, murmurs, rubs, gallops or clicks. Gastrointestinal system: Soft, nontender, nondistended, bowel sounds positive. Central nervous system: Alert and oriented. No focal neurological deficits.Symmetric 5 x 5 power. Extremities: No edema, no cyanosis, pulses intact and symmetrical. Psychiatry: Judgement and insight appear normal.   DVT prophylaxis: Heparin Code Status: Full Family Communication: Husband at bedside Disposition Plan: Pending improvement.  Will go back home.  Consultants:  Nephrology  Procedures:  Antimicrobials:   Data Reviewed: I have personally reviewed following labs and imaging studies  CBC: Recent Labs  Lab 06/07/19 1300 06/09/19 0841 06/09/19 0842 06/10/19 0550 06/11/19 0639  WBC 6.5 7.6  --  5.6 4.8  NEUTROABS   --  6.6  --   --   --   HGB 10.2* 10.6* 11.2* 9.9* 10.6*  HCT 32.2* 32.5* 33.0* 31.0* 33.7*  MCV 84.5 82.1  --  84.0 84.7  PLT 267 244  --  186 [id number]   Basic Metabolic Panel: Recent Labs  Lab 06/07/19 1156 06/09/19 0841 06/09/19 0842 06/10/19 0550  NA 128* 125* 123* 131*  K 5.0 4.8 4.7 4.1  CL 91* 86* 87* 94*  CO2 23 21*  --  27  GLUCOSE 90 117* 117* 82  BUN 63* 102* 107* 28*  CREATININE 3.72* 5.77* 6.20* 2.99*  CALCIUM 7.8* 8.1*  --  7.3*  PHOS  --   --   --  4.7*   GFR: Estimated Creatinine Clearance: 8.8 mL/min (A) (by C-G formula based on SCr of 2.99 mg/dL (H)). Liver Function Tests: Recent Labs  Lab 06/09/19 0841 06/10/19 0550  AST 33  --   ALT 29  --   ALKPHOS 132*  --   BILITOT 0.7  --   PROT 6.4*  --   ALBUMIN 3.5 2.8*   Recent Labs  Lab 06/09/19 0841  LIPASE 130*   No results for input(s): AMMONIA in the last 168 hours. Coagulation Profile: No results for input(s): INR, PROTIME in the last 168 hours. Cardiac Enzymes: No results for input(s): CKTOTAL, CKMB, CKMBINDEX, TROPONINI in the last 168 hours. BNP (last 3 results) No results for input(s): PROBNP in the last 8760 hours. HbA1C: Recent Labs    06/09/19 1559  HGBA1C 4.9   CBG: Recent Labs  Lab 06/10/19 0718 06/10/19 1100 06/10/19 1724 06/10/19 2039 06/11/19 0726  GLUCAP 115* 111* 115* 132* 78   Lipid Profile: No results for input(s): CHOL, HDL, LDLCALC, TRIG, CHOLHDL, LDLDIRECT in the last 72 hours. Thyroid Function Tests: No results for input(s): TSH, T4TOTAL, FREET4, T3FREE, THYROIDAB in the last 72 hours. Anemia Panel: No results for input(s): VITAMINB12, FOLATE, FERRITIN, TIBC, IRON, RETICCTPCT in the last 72 hours. Sepsis Labs: Recent Labs  Lab 06/09/19 0856 06/09/19 1112  LATICACIDVEN 0.6 0.7    Recent Results (from the past 240 hour(s))  Respiratory Panel by RT PCR (Flu A&B, Covid) - Nasopharyngeal Swab     Status: None   Collection Time: 06/09/19  8:27 AM   Specimen:  Nasopharyngeal Swab  Result Value Ref Range Status   SARS Coronavirus 2 by RT PCR NEGATIVE NEGATIVE Final    Comment: (NOTE) SARS-CoV-2 target nucleic acids are NOT DETECTED. The SARS-CoV-2 RNA is generally detectable in upper respiratoy specimens during the acute phase of infection. The lowest concentration of SARS-CoV-2 viral copies this assay can detect is 131 copies/mL. A negative result does not preclude SARS-Cov-2 infection and should not be used as the sole basis for treatment or other patient management decisions. A negative result may occur with  improper specimen collection/handling, submission of specimen other than nasopharyngeal swab, presence of viral mutation(s) within the areas targeted by this assay, and inadequate number of viral copies (<131 copies/mL). A negative result must be combined with clinical observations, patient history, and epidemiological information. The expected result is Negative. Fact Sheet for Patients:  PinkCheek.be Fact Sheet for Healthcare Providers:  GravelBags.it This test is not  yet ap proved or cleared by the [place] and  has been authorized for detection and/or diagnosis of SARS-CoV-2 by FDA under an Emergency Use Authorization (EUA). This EUA will remain  in effect (meaning this test can be used) for the duration of the COVID-19 declaration under Section 564(b)(1) of the Act, 21 U.S.C. section 360bbb-3(b)(1), unless the authorization is terminated or revoked sooner.    Influenza A by PCR NEGATIVE NEGATIVE Final   Influenza B by PCR NEGATIVE NEGATIVE Final    Comment: (NOTE) The Xpert Xpress SARS-CoV-2/FLU/RSV assay is intended as an aid in  the diagnosis of influenza from Nasopharyngeal swab specimens and  should not be used as a sole basis for treatment. Nasal washings and  aspirates are unacceptable for Xpert Xpress SARS-CoV-2/FLU/RSV  testing. Fact Sheet for  Patients: PinkCheek.be Fact Sheet for Healthcare Providers: GravelBags.it This test is not yet approved or cleared by the [place] FDA and  has been authorized for detection and/or diagnosis of SARS-CoV-2 by  FDA under an Emergency Use Authorization (EUA). This EUA will remain  in effect (meaning this test can be used) for the duration of the  Covid-19 declaration under Section 564(b)(1) of the Act, 21  U.S.C. section 360bbb-3(b)(1), unless the authorization is  terminated or revoked. Performed at [facility], [place] [address], [place], [place] [address]   Culture, blood (routine x 2)     Status: None (Preliminary result)   Collection Time: 06/09/19  9:17 AM   Specimen: BLOOD  Result Value Ref Range Status   Specimen Description BLOOD LEFT ANTECUBITAL  Final   Special Requests   Final    BOTTLES DRAWN AEROBIC ONLY Blood Culture adequate volume   Culture   Final    NO GROWTH 2 DAYS Performed at [facility], [place] [address], [place], [place] [address]    Report Status PENDING  Incomplete  Culture, blood (routine x 2)     Status: None (Preliminary result)   Collection Time: 06/09/19  9:17 AM   Specimen: BLOOD LEFT ARM  Result Value Ref Range Status   Specimen Description BLOOD LEFT ARM  Final   Special Requests   Final    BOTTLES DRAWN AEROBIC AND ANAEROBIC Blood Culture adequate volume   Culture   Final    NO GROWTH 2 DAYS Performed at [facility], [place] [address] [place], [place] [address]    Report Status PENDING  Incomplete     Radiology Studies: CT Abdomen Pelvis Wo Contrast  Result Date: 06/09/2019 CLINICAL DATA:  Nausea. Left-sided chest pain. End-stage renal disease on dialysis. EXAM: CT ABDOMEN AND PELVIS WITHOUT CONTRAST TECHNIQUE: Multidetector CT imaging of the abdomen and pelvis was performed following the standard protocol without IV contrast. COMPARISON:  Chest radiographs 06/09/2019  and 06/07/2019. FINDINGS: Lower chest: Bilateral pleural effusions are noted. Moderate diffuse airspace disease is present. The heart is enlarged. Small pericardial effusion present. Hepatobiliary: No focal liver abnormality is seen. No gallstones, gallbladder wall thickening, or biliary dilatation. Pancreas: Unremarkable. No pancreatic ductal dilatation or surrounding inflammatory changes. Spleen: Normal in size without focal abnormality. Adrenals/Urinary Tract: Adrenal glands are normal bilaterally. Kidneys and ureters are unremarkable. No stone or mass lesion present. The urinary bladder is contracted, within normal limits. Stomach/Bowel: Stomach and duodenum are within normal limits. The small bowel unremarkable. Terminal ileum is normal. Appendix is visualized and normal. The ascending and transverse colon are within normal limits. Descending and sigmoid colon are normal.  Vascular/Lymphatic: Atherosclerotic calcifications present the aorta and branch vessels without aneurysm. Reproductive: Uterus is calcified. Adnexa are unremarkable. Other: Small amount of free fluid is present. There is fluid about the spleen. There is some stranding in the mesentery as well as in the subcutaneous soft tissues. Musculoskeletal: Right femur ORIF is noted. Vertebral body heights and alignment are maintained. Focal lytic or blastic lesions are present. Degenerative changes are present at both hips, right greater left. IMPRESSION: 1. Bilateral pleural effusions and diffuse airspace disease concerning for edema or infection. Airspace disease has increased over the last 2 days. 2. Cardiomegaly with a small pericardial effusion. 3. Small amount of free fluid about the spleen and within the subcutaneous soft tissues may be secondary to congestive heart failure. 4. Aortic Atherosclerosis (ICD10-I70.0). Electronically Signed   By: San Morelle M.D.   On: 06/09/2019 10:29   DG Chest Port 1 View  Result Date:  06/09/2019 CLINICAL DATA:  Chest pain, shortness of breath. EXAM: PORTABLE CHEST 1 VIEW COMPARISON:  Chest radiograph 06/07/2019 FINDINGS: Unchanged position of a right sided dialysis catheter with tip projecting in the region of the right atrium. Unchanged cardiomegaly. Again demonstrated are prominent bilateral interstitial airspace opacities. More conspicuous than on prior examination there are more ill-defined opacities within the bilateral lung bases. Trace right and small left pleural effusions new from prior examination no evidence of pneumothorax. No acute bony abnormality. Overlying cardiac monitoring leads. IMPRESSION: Redemonstrated bilateral interstitial opacities. New from prior exam, there are more ill-defined opacities within the bilateral lung bases. Findings may reflect interstitial/pulmonary edema or infection. Trace right with small left pleural effusions, new from prior exam. Unchanged cardiomegaly. Electronically Signed   By: Kellie Simmering DO   On: 06/09/2019 08:29    Scheduled Meds: . amLODipine  5 mg Oral Daily  . calcium-vitamin D  1 tablet Oral Q breakfast  . Chlorhexidine Gluconate Cloth  6 each Topical Q0600  . heparin  5,000 Units Subcutaneous Q8H  . insulin aspart  0-6 Units Subcutaneous TID WC  . sodium chloride flush  3 mL Intravenous Q12H   Continuous Infusions: . sodium chloride    . sodium chloride       LOS: 2 days   Time spent: 35 minutes.  Lorella Nimrod, MD [facility]  If 7PM-7AM, please contact night-coverage [email or web address]  06/11/2019, 7:47 AM   This record has been created using Systems analyst. Errors have been sought and corrected,but may not always be located. Such creation errors do not reflect on the standard of care.

## 2019-06-11 NOTE — Plan of Care (Signed)
  Problem: Clinical Measurements: Goal: Ability to maintain clinical measurements within normal limits will improve Outcome: Progressing   

## 2019-06-12 LAB — HEPATITIS B CORE ANTIBODY, TOTAL: Hep B Core Total Ab: NONREACTIVE

## 2019-06-12 LAB — CBC
HCT: 31.5 % — ABNORMAL LOW (ref 36.0–46.0)
Hemoglobin: 9.9 g/dL — ABNORMAL LOW (ref 12.0–15.0)
MCH: 26.5 pg (ref 26.0–34.0)
MCHC: 31.4 g/dL (ref 30.0–36.0)
MCV: 84.5 fL (ref 80.0–100.0)
Platelets: 264 10*3/uL (ref 150–400)
RBC: 3.73 MIL/uL — ABNORMAL LOW (ref 3.87–5.11)
RDW: 15.7 % — ABNORMAL HIGH (ref 11.5–15.5)
WBC: 5.7 10*3/uL (ref 4.0–10.5)
nRBC: 0 % (ref 0.0–0.2)

## 2019-06-12 LAB — GLUCOSE, CAPILLARY
Glucose-Capillary: 173 mg/dL — ABNORMAL HIGH (ref 70–99)
Glucose-Capillary: 97 mg/dL (ref 70–99)

## 2019-06-12 LAB — RENAL FUNCTION PANEL
Albumin: 3 g/dL — ABNORMAL LOW (ref 3.5–5.0)
Anion gap: 14 (ref 5–15)
BUN: 38 mg/dL — ABNORMAL HIGH (ref 8–23)
CO2: 25 mmol/L (ref 22–32)
Calcium: 7.7 mg/dL — ABNORMAL LOW (ref 8.9–10.3)
Chloride: 90 mmol/L — ABNORMAL LOW (ref 98–111)
Creatinine, Ser: 4.63 mg/dL — ABNORMAL HIGH (ref 0.44–1.00)
GFR calc Af Amer: 10 mL/min — ABNORMAL LOW (ref >60)
GFR calc non Af Amer: 9 mL/min — ABNORMAL LOW (ref >60)
Glucose, Bld: 168 mg/dL — ABNORMAL HIGH (ref 70–99)
Phosphorus: 5.2 mg/dL — ABNORMAL HIGH (ref 2.5–4.6)
Potassium: 3.3 mmol/L — ABNORMAL LOW (ref 3.5–5.1)
Sodium: 129 mmol/L — ABNORMAL LOW (ref 135–145)

## 2019-06-12 LAB — HEPATITIS B SURFACE ANTIBODY,QUALITATIVE: Hep B S Ab: NONREACTIVE

## 2019-06-12 LAB — HEPATITIS B SURFACE ANTIGEN: Hepatitis B Surface Ag: NONREACTIVE

## 2019-06-12 MED ORDER — AMLODIPINE BESYLATE 10 MG PO TABS: 10.0000 mg | ORAL_TABLET | Freq: Every day | ORAL | 0 refills | Status: DC

## 2019-06-12 MED ORDER — HEPARIN SODIUM (PORCINE) 1000 UNIT/ML IJ SOLN
INTRAMUSCULAR | Status: AC
  Administered 2019-06-12: 4000 [IU]
  Filled 2019-06-12: qty 4

## 2019-06-12 NOTE — TOC Transition Note (Signed)
Transition of Care [facility]) - CM/SW Discharge Note   Patient Details  Name: Amber Dixon MRN: [id number] Date of Birth: Jan 25, 1951  Transition of Care [facility]) CM/SW Contact:  Bartholomew Crews, RN Phone Number: [phone] 06/12/2019, 11:53 AM   Clinical Narrative:    Notified by MD of patient readiness to transition home. Spoke with son, Clifton James, with [employer] interpreter, [place] [phone]. Clifton James is available to pick patient up when she is ready. NCM advised that patient is currently in dialysis, but the nurse would call when she is ready. RN made aware. MD notified that patient has transportation available. Renal navigator advised of plans to transition home No further TOC needs identified at this time.    Final next level of care: Home/Self Care Barriers to Discharge: No Barriers Identified   Patient Goals and CMS Choice     Choice offered to / list presented to : NA  Discharge Placement                       Discharge Plan and Services                DME Arranged: N/A DME Agency: NA       HH Arranged: NA HH Agency: NA        Social Determinants of Health (SDOH) Interventions     Readmission Risk Interventions No flowsheet data found.

## 2019-06-12 NOTE — Discharge Summary (Signed)
Physician Discharge Summary  Amber Dixon [id number] DOB: 07-09-1950 DOA: 06/09/2019  PCP: Patient, No Pcp Per  Admit date: 06/09/2019 Discharge date: 06/12/2019  Admitted From: Home Disposition: Home  Recommendations for Outpatient Follow-up:  1. Follow up with PCP in 1-2 weeks 2. Please obtain BMP/CBC in one week 3. Please follow up on the following pending results:None  Home Health: No Equipment/Devices: None Discharge Condition: Stable CODE STATUS: Full Diet recommendation: Heart Healthy / Carb Modified   Brief/Interim Summary: Amber Dixon a 68 y.o.femalewith medical history significant ofESRD on HD(TTS), hypertension, and diabetes mellitus type 2, presents with complaints of nausea and vomiting over the last 2 days. She is obtained with the use of translator services. Previously evaluated in emergency department 2 days ago with complaints of frontal headache. At that time she reported having nausea and vomiting as well. CT scan of the brain did not show any acute abnormalities and patient received Reglan and Benadryl with some improvement in symptoms. States that her symptoms of headache, nausea, and vomiting returned shortly thereafter being discharged home.Missedher hemodialysis on Saturday due to being in the emergency department. He has been unable to keep any significant food or liquids down. Emesis is noted to be nonbloody and watery this time. Associated symptoms include complaints of left-sided chest pain and shortness of breath. Denies any abdominal pain, fever, or cough. She was admitted due to acute respiratory failure secondary to volume overload.  Her volume status improved with dialysis.  Saturating well on room air.  Was some delay in her improvement of nausea and vomiting.  Gastric emptying studies were done which were negative for any delayed emptying.  Her nausea and vomiting improved the next day of her studies.  She  was able to tolerate diet well.  There was mildly elevated lipase but CT abdomen was negative for any pancreatitis.  She will follow-up with her primary care.  Blood pressure remained elevated most of the time.  We will increase her home dose of amlodipine from 5 to 10 mg daily.  She will follow-up with her PCP for further management.  Discharge Diagnoses:  Principal Problem:   Fluid overload Active Problems:   Chest pain   Elevated troponin   Diabetes mellitus type 2 in nonobese (HCC)   Anemia of chronic disease   Nausea and vomiting   Pulmonary edema   ESRD (end stage renal disease) [facility])   Discharge Instructions  Discharge Instructions    Diet - low sodium heart healthy   Complete by: As directed    Discharge instructions   Complete by: As directed    It was pleasure taking care of you. I increase the dose of Amlodipine to 10 mg as your BP was high. Please follow up with your doctor for further management.   Increase activity slowly   Complete by: As directed      Allergies as of 06/12/2019   No Known Allergies     Medication List    TAKE these medications   amLODipine 10 MG tablet Commonly known as: NORVASC Take 1 tablet (10 mg total) by mouth daily. What changed:   medication strength  how much to take   calcium-vitamin D 500-200 MG-UNIT tablet Commonly known as: OSCAL WITH D Take 1 tablet by mouth daily with breakfast.   PRESCRIPTION MEDICATION Inject 5 Units into the skin daily. Insulin comes from "[place]" however, patient does not know the name of the insulin. Patient states she took 5 units  yesterday.       No Known Allergies  Consultations:  Nephrology  Procedures/Studies: CT Abdomen Pelvis Wo Contrast  Result Date: 06/09/2019 CLINICAL DATA:  Nausea. Left-sided chest pain. End-stage renal disease on dialysis. EXAM: CT ABDOMEN AND PELVIS WITHOUT CONTRAST TECHNIQUE: Multidetector CT imaging of the abdomen and pelvis was performed following the  standard protocol without IV contrast. COMPARISON:  Chest radiographs 06/09/2019 and 06/07/2019. FINDINGS: Lower chest: Bilateral pleural effusions are noted. Moderate diffuse airspace disease is present. The heart is enlarged. Small pericardial effusion present. Hepatobiliary: No focal liver abnormality is seen. No gallstones, gallbladder wall thickening, or biliary dilatation. Pancreas: Unremarkable. No pancreatic ductal dilatation or surrounding inflammatory changes. Spleen: Normal in size without focal abnormality. Adrenals/Urinary Tract: Adrenal glands are normal bilaterally. Kidneys and ureters are unremarkable. No stone or mass lesion present. The urinary bladder is contracted, within normal limits. Stomach/Bowel: Stomach and duodenum are within normal limits. The small bowel unremarkable. Terminal ileum is normal. Appendix is visualized and normal. The ascending and transverse colon are within normal limits. Descending and sigmoid colon are normal. Vascular/Lymphatic: Atherosclerotic calcifications present the aorta and branch vessels without aneurysm. Reproductive: Uterus is calcified. Adnexa are unremarkable. Other: Small amount of free fluid is present. There is fluid about the spleen. There is some stranding in the mesentery as well as in the subcutaneous soft tissues. Musculoskeletal: Right femur ORIF is noted. Vertebral body heights and alignment are maintained. Focal lytic or blastic lesions are present. Degenerative changes are present at both hips, right greater left. IMPRESSION: 1. Bilateral pleural effusions and diffuse airspace disease concerning for edema or infection. Airspace disease has increased over the last 2 days. 2. Cardiomegaly with a small pericardial effusion. 3. Small amount of free fluid about the spleen and within the subcutaneous soft tissues may be secondary to congestive heart failure. 4. Aortic Atherosclerosis (ICD10-I70.0). Electronically Signed   By: San Morelle M.D.    On: 06/09/2019 10:29   CT Head Wo Contrast  Result Date: 06/07/2019 CLINICAL DATA:  Severe headache. EXAM: CT HEAD WITHOUT CONTRAST TECHNIQUE: Contiguous axial images were obtained from the base of the skull through the vertex without intravenous contrast. COMPARISON:  None. FINDINGS: Brain: No evidence of acute infarction, hemorrhage, hydrocephalus, extra-axial collection or mass lesion/mass effect. Diffuse periventricular white matter hypoattenuation consistent with chronic small vessel ischemic change. Old left basal ganglia lacunar infarct. Vascular: Calcification of the left vertebral artery and bilateral carotid siphons. No hyper dense vessel. Skull: Normal. Negative for fracture or focal lesion. Sinuses/Orbits: Small retention cyst in the right sphenoid sinus. Otherwise sinuses are unremarkable. Normal appearance of the orbits. Other: None. IMPRESSION: 1. No acute intracranial abnormality. 2. Diffuse periventricular white matter hypoattenuation consistent with chronic small vessel ischemic change. Electronically Signed   By: Audie Pinto M.D.   On: 06/07/2019 12:33   NM GASTRIC EMPTYING  Result Date: 06/11/2019 CLINICAL DATA:  Nausea and vomiting EXAM: NUCLEAR MEDICINE GASTRIC EMPTYING SCAN TECHNIQUE: After oral ingestion of radiolabeled meal, sequential abdominal images were obtained for 120 minutes. Residual percentage of activity remaining within the stomach was calculated at 60 and 120 minutes. RADIOPHARMACEUTICALS:  2.0 mCi Tc-75m sulfur colloid in standardized meal including egg COMPARISON:  None. FINDINGS: Expected location of the stomach in the left upper quadrant. Ingested meal empties the stomach gradually over the course of the study with 17.6% retention at 60 min and 1.7% retention at 120 min (normal retention less than 30% at a 120 min). IMPRESSION: Normal gastric emptying study. Electronically  Signed   By: Lowella Grip III M.D.   On: 06/11/2019 12:39   DG Chest Port 1  View  Result Date: 06/09/2019 CLINICAL DATA:  Chest pain, shortness of breath. EXAM: PORTABLE CHEST 1 VIEW COMPARISON:  Chest radiograph 06/07/2019 FINDINGS: Unchanged position of a right sided dialysis catheter with tip projecting in the region of the right atrium. Unchanged cardiomegaly. Again demonstrated are prominent bilateral interstitial airspace opacities. More conspicuous than on prior examination there are more ill-defined opacities within the bilateral lung bases. Trace right and small left pleural effusions new from prior examination no evidence of pneumothorax. No acute bony abnormality. Overlying cardiac monitoring leads. IMPRESSION: Redemonstrated bilateral interstitial opacities. New from prior exam, there are more ill-defined opacities within the bilateral lung bases. Findings may reflect interstitial/pulmonary edema or infection. Trace right with small left pleural effusions, new from prior exam. Unchanged cardiomegaly. Electronically Signed   By: Kellie Simmering DO   On: 06/09/2019 08:29   DG Chest Port 1 View  Result Date: 06/07/2019 CLINICAL DATA:  Nausea and headache. Missed dialysis today. EXAM: PORTABLE CHEST 1 VIEW COMPARISON:  None. FINDINGS: Moderate to prominent enlargement of the cardiopericardial silhouette. Dialysis catheter tip: Right atrium. Bilateral primarily interstitial opacities in the lungs with some Kerley B lines favoring moderate congestive heart failure. May be some early perihilar and basilar airspace opacities for example in the left mid lung and right lung base. IMPRESSION: 1. Moderate congestive heart failure. 2. Right IJ dialysis catheter tip: Right atrium. Electronically Signed   By: Van Clines M.D.   On: 06/07/2019 12:37     Subjective: Patient was feeling better when seen this morning.  She was getting her dialysis.  She denies any more nausea or vomiting.  She was able to tolerate diet.  Discharge Exam: Vitals:   06/12/19 1100 06/12/19 1130   BP: 126/67 129/68  Pulse: 74 77  Resp:    Temp:    SpO2:     Vitals:   06/12/19 1000 06/12/19 1030 06/12/19 1100 06/12/19 1130  BP: 134/65 (!) 150/77 126/67 129/68  Pulse: 75 76 74 77  Resp:      Temp:      TempSrc:      SpO2:      Weight:      Height:        General: Pt is alert, awake, not in acute distress Cardiovascular: RRR, S1/S2 +, no rubs, no gallops Respiratory: CTA bilaterally, no wheezing, no rhonchi Abdominal: Soft, NT, ND, bowel sounds + Extremities: no edema, no cyanosis   The results of significant diagnostics from this hospitalization (including imaging, microbiology, ancillary and laboratory) are listed below for reference.    Microbiology: Recent Results (from the past 240 hour(s))  Respiratory Panel by RT PCR (Flu A&B, Covid) - Nasopharyngeal Swab     Status: None   Collection Time: 06/09/19  8:27 AM   Specimen: Nasopharyngeal Swab  Result Value Ref Range Status   SARS Coronavirus 2 by RT PCR NEGATIVE NEGATIVE Final    Comment: (NOTE) SARS-CoV-2 target nucleic acids are NOT DETECTED. The SARS-CoV-2 RNA is generally detectable in upper respiratoy specimens during the acute phase of infection. The lowest concentration of SARS-CoV-2 viral copies this assay can detect is 131 copies/mL. A negative result does not preclude SARS-Cov-2 infection and should not be used as the sole basis for treatment or other patient management decisions. A negative result may occur with  improper specimen collection/handling, submission of specimen other than nasopharyngeal  swab, presence of viral mutation(s) within the areas targeted by this assay, and inadequate number of viral copies (<131 copies/mL). A negative result must be combined with clinical observations, patient history, and epidemiological information. The expected result is Negative. Fact Sheet for Patients:  PinkCheek.be Fact Sheet for Healthcare Providers:   GravelBags.it This test is not yet ap proved or cleared by the [place] FDA and  has been authorized for detection and/or diagnosis of SARS-CoV-2 by FDA under an Emergency Use Authorization (EUA). This EUA will remain  in effect (meaning this test can be used) for the duration of the COVID-19 declaration under Section 564(b)(1) of the Act, 21 U.S.C. section 360bbb-3(b)(1), unless the authorization is terminated or revoked sooner.    Influenza A by PCR NEGATIVE NEGATIVE Final   Influenza B by PCR NEGATIVE NEGATIVE Final    Comment: (NOTE) The Xpert Xpress SARS-CoV-2/FLU/RSV assay is intended as an aid in  the diagnosis of influenza from Nasopharyngeal swab specimens and  should not be used as a sole basis for treatment. Nasal washings and  aspirates are unacceptable for Xpert Xpress SARS-CoV-2/FLU/RSV  testing. Fact Sheet for Patients: PinkCheek.be Fact Sheet for Healthcare Providers: GravelBags.it This test is not yet approved or cleared by the [place] FDA and  has been authorized for detection and/or diagnosis of SARS-CoV-2 by  FDA under an Emergency Use Authorization (EUA). This EUA will remain  in effect (meaning this test can be used) for the duration of the  Covid-19 declaration under Section 564(b)(1) of the Act, 21  U.S.C. section 360bbb-3(b)(1), unless the authorization is  terminated or revoked. Performed at [facility], [place] [address], [place], [place] [address]   Culture, blood (routine x 2)     Status: None (Preliminary result)   Collection Time: 06/09/19  9:17 AM   Specimen: BLOOD  Result Value Ref Range Status   Specimen Description BLOOD LEFT ANTECUBITAL  Final   Special Requests   Final    BOTTLES DRAWN AEROBIC ONLY Blood Culture adequate volume   Culture   Final    NO GROWTH 3 DAYS Performed at [facility], [place] [address], [place], [place]  [address]    Report Status PENDING  Incomplete  Culture, blood (routine x 2)     Status: None (Preliminary result)   Collection Time: 06/09/19  9:17 AM   Specimen: BLOOD LEFT ARM  Result Value Ref Range Status   Specimen Description BLOOD LEFT ARM  Final   Special Requests   Final    BOTTLES DRAWN AEROBIC AND ANAEROBIC Blood Culture adequate volume   Culture   Final    NO GROWTH 3 DAYS Performed at [facility], [place] [address], [place], [place] [address]    Report Status PENDING  Incomplete     Labs: BNP (last 3 results) No results for input(s): BNP in the last 8760 hours. Basic Metabolic Panel: Recent Labs  Lab 06/07/19 1156 06/09/19 0841 06/09/19 0842 06/10/19 0550 06/12/19 1010  NA 128* 125* 123* 131* 129*  K 5.0 4.8 4.7 4.1 3.3*  CL 91* 86* 87* 94* 90*  CO2 23 21*  --  27 25  GLUCOSE 90 117* 117* 82 168*  BUN 63* 102* 107* 28* 38*  CREATININE 3.72* 5.77* 6.20* 2.99* 4.63*  CALCIUM 7.8* 8.1*  --  7.3* 7.7*  PHOS  --   --   --  4.7* 5.2*   Liver Function Tests: Recent Labs  Lab 06/09/19  [id number] 06/10/19 0550 06/12/19 1010  AST 33  --   --   ALT 29  --   --   ALKPHOS 132*  --   --   BILITOT 0.7  --   --   PROT 6.4*  --   --   ALBUMIN 3.5 2.8* 3.0*   Recent Labs  Lab 06/09/19 0841  LIPASE 130*   No results for input(s): AMMONIA in the last 168 hours. CBC: Recent Labs  Lab 06/07/19 1300 06/07/19 1300 06/09/19 0841 06/09/19 0842 06/10/19 0550 06/11/19 0639 06/12/19 1009  WBC 6.5  --  7.6  --  5.6 4.8 5.7  NEUTROABS  --   --  6.6  --   --   --   --   HGB 10.2*   < > 10.6* 11.2* 9.9* 10.6* 9.9*  HCT 32.2*   < > 32.5* 33.0* 31.0* 33.7* 31.5*  MCV 84.5  --  82.1  --  84.0 84.7 84.5  PLT 267  --  244  --  186 213 264   < > = values in this interval not displayed.   Cardiac Enzymes: No results for input(s): CKTOTAL, CKMB, CKMBINDEX, TROPONINI in the last 168 hours. BNP: Invalid input(s): POCBNP CBG: Recent Labs  Lab 06/11/19 0726 06/11/19 1157  06/11/19 1607 06/11/19 2128 06/12/19 0716  GLUCAP 78 88 165* 92 173*   D-Dimer No results for input(s): DDIMER in the last 72 hours. Hgb A1c Recent Labs    06/09/19 1559  HGBA1C 4.9   Lipid Profile No results for input(s): CHOL, HDL, LDLCALC, TRIG, CHOLHDL, LDLDIRECT in the last 72 hours. Thyroid function studies No results for input(s): TSH, T4TOTAL, T3FREE, THYROIDAB in the last 72 hours.  Invalid input(s): FREET3 Anemia work up No results for input(s): VITAMINB12, FOLATE, FERRITIN, TIBC, IRON, RETICCTPCT in the last 72 hours. Urinalysis No results found for: COLORURINE, APPEARANCEUR, [place], [place], [place], [place], [place], [place], PROTEINUR, UROBILINOGEN, NITRITE, LEUKOCYTESUR Sepsis Labs Invalid input(s): PROCALCITONIN,  WBC,  LACTICIDVEN Microbiology Recent Results (from the past 240 hour(s))  Respiratory Panel by RT PCR (Flu A&B, Covid) - Nasopharyngeal Swab     Status: None   Collection Time: 06/09/19  8:27 AM   Specimen: Nasopharyngeal Swab  Result Value Ref Range Status   SARS Coronavirus 2 by RT PCR NEGATIVE NEGATIVE Final    Comment: (NOTE) SARS-CoV-2 target nucleic acids are NOT DETECTED. The SARS-CoV-2 RNA is generally detectable in upper respiratoy specimens during the acute phase of infection. The lowest concentration of SARS-CoV-2 viral copies this assay can detect is 131 copies/mL. A negative result does not preclude SARS-Cov-2 infection and should not be used as the sole basis for treatment or other patient management decisions. A negative result may occur with  improper specimen collection/handling, submission of specimen other than nasopharyngeal swab, presence of viral mutation(s) within the areas targeted by this assay, and inadequate number of viral copies (<131 copies/mL). A negative result must be combined with clinical observations, patient history, and epidemiological information. The expected result is Negative. Fact Sheet for  Patients:  PinkCheek.be Fact Sheet for Healthcare Providers:  GravelBags.it This test is not yet ap proved or cleared by the [place] FDA and  has been authorized for detection and/or diagnosis of SARS-CoV-2 by FDA under an Emergency Use Authorization (EUA). This EUA will remain  in effect (meaning this test can be used) for the duration of the COVID-19 declaration under Section 564(b)(1) of the Act, 21 U.S.C. section 360bbb-3(b)(1), unless the  authorization is terminated or revoked sooner.    Influenza A by PCR NEGATIVE NEGATIVE Final   Influenza B by PCR NEGATIVE NEGATIVE Final    Comment: (NOTE) The Xpert Xpress SARS-CoV-2/FLU/RSV assay is intended as an aid in  the diagnosis of influenza from Nasopharyngeal swab specimens and  should not be used as a sole basis for treatment. Nasal washings and  aspirates are unacceptable for Xpert Xpress SARS-CoV-2/FLU/RSV  testing. Fact Sheet for Patients: PinkCheek.be Fact Sheet for Healthcare Providers: GravelBags.it This test is not yet approved or cleared by the [place] FDA and  has been authorized for detection and/or diagnosis of SARS-CoV-2 by  FDA under an Emergency Use Authorization (EUA). This EUA will remain  in effect (meaning this test can be used) for the duration of the  Covid-19 declaration under Section 564(b)(1) of the Act, 21  U.S.C. section 360bbb-3(b)(1), unless the authorization is  terminated or revoked. Performed at [facility], [place] [address], [place], [place] [address]   Culture, blood (routine x 2)     Status: None (Preliminary result)   Collection Time: 06/09/19  9:17 AM   Specimen: BLOOD  Result Value Ref Range Status   Specimen Description BLOOD LEFT ANTECUBITAL  Final   Special Requests   Final    BOTTLES DRAWN AEROBIC ONLY Blood Culture adequate volume   Culture   Final    NO  GROWTH 3 DAYS Performed at [facility], [place] [address], [place], [place] [address]    Report Status PENDING  Incomplete  Culture, blood (routine x 2)     Status: None (Preliminary result)   Collection Time: 06/09/19  9:17 AM   Specimen: BLOOD LEFT ARM  Result Value Ref Range Status   Specimen Description BLOOD LEFT ARM  Final   Special Requests   Final    BOTTLES DRAWN AEROBIC AND ANAEROBIC Blood Culture adequate volume   Culture   Final    NO GROWTH 3 DAYS Performed at [facility], [place] [address], [place], [place] [address]    Report Status PENDING  Incomplete    Time coordinating discharge: Over 30 minutes  SIGNED:  Lorella Nimrod, MD  [facility] 06/12/2019, 12:11 PM  If 7PM-7AM, please contact night-coverage [email or web address]  This record has been created using Systems analyst. Errors have been sought and corrected,but may not always be located. Such creation errors do not reflect on the standard of care.

## 2019-06-12 NOTE — Progress Notes (Signed)
[facility] Progress Note   Subjective:  Seen on HD - 1.5L UFG. Tells me no vomiting today - looks much improved. Denies CP or dyspnea.    Objective Vitals:   06/12/19 0553 06/12/19 0822 06/12/19 0830 06/12/19 0900  BP: (!) 187/76 (!) 188/88 (!) 175/81 (!) 149/73  Pulse: 90 85 79 78  Resp: 18 16 15 16   Temp: 98.9 F (37.2 C) 98.4 F (36.9 C)    TempSrc: Oral Oral    SpO2: 93%  95%   Weight:  33.3 kg    Height:       Physical Exam General:Frail woman, NAD - in better spirits today. Heart:Tachycardic, 2/6 murmur Lungs:CTAB Abdomen:soft, non-tender Extremities:No LE edema Dialysis Access:TDC in R chest, RUE AVF  Additional Objective Labs: Basic Metabolic Panel: Recent Labs  Lab 06/07/19 1156 06/07/19 1156 06/09/19 0841 06/09/19 0842 06/10/19 0550  NA 128*   < > 125* 123* 131*  K 5.0   < > 4.8 4.7 4.1  CL 91*   < > 86* 87* 94*  CO2 23  --  21*  --  27  GLUCOSE 90   < > 117* 117* 82  BUN 63*   < > 102* 107* 28*  CREATININE 3.72*   < > 5.77* 6.20* 2.99*  CALCIUM 7.8*  --  8.1*  --  7.3*  PHOS  --   --   --   --  4.7*   < > = values in this interval not displayed.   Liver Function Tests: Recent Labs  Lab 06/09/19 0841 06/10/19 0550  AST 33  --   ALT 29  --   ALKPHOS 132*  --   BILITOT 0.7  --   PROT 6.4*  --   ALBUMIN 3.5 2.8*   CBC: Recent Labs  Lab 06/07/19 1300 06/07/19 1300 06/09/19 0841 06/09/19 0841 06/09/19 0842 06/10/19 0550 06/11/19 0639  WBC 6.5   < > 7.6  --   --  5.6 4.8  NEUTROABS  --   --  6.6  --   --   --   --   HGB 10.2*   < > 10.6*   < > 11.2* 9.9* 10.6*  HCT 32.2*   < > 32.5*   < > 33.0* 31.0* 33.7*  MCV 84.5  --  82.1  --   --  84.0 84.7  PLT 267   < > 244  --   --  186 213   < > = values in this interval not displayed.   Studies/Results: NM GASTRIC EMPTYING  Result Date: 06/11/2019 CLINICAL DATA:  Nausea and vomiting EXAM: NUCLEAR MEDICINE GASTRIC EMPTYING SCAN TECHNIQUE: After oral ingestion of  radiolabeled meal, sequential abdominal images were obtained for 120 minutes. Residual percentage of activity remaining within the stomach was calculated at 60 and 120 minutes. RADIOPHARMACEUTICALS:  2.0 mCi Tc-72m sulfur colloid in standardized meal including egg COMPARISON:  None. FINDINGS: Expected location of the stomach in the left upper quadrant. Ingested meal empties the stomach gradually over the course of the study with 17.6% retention at 60 min and 1.7% retention at 120 min (normal retention less than 30% at a 120 min). IMPRESSION: Normal gastric emptying study. Electronically Signed   By: Lowella Grip III M.D.   On: 06/11/2019 12:39   Medications: . sodium chloride    . sodium chloride     . amLODipine  10 mg Oral Daily  . calcium-vitamin D  1 tablet Oral Q  breakfast  . Chlorhexidine Gluconate Cloth  6 each Topical Q0600  . heparin  5,000 Units Subcutaneous Q8H  . insulin aspart  0-6 Units Subcutaneous TID WC  . sodium chloride flush  3 mL Intravenous Q12H    Dialysis Orders: TTS at [facility] -> missed 2/13, last HD 2/11 4hr, 400/800, EDW 32.4kg, 2K/2.25Ca, TDC, heparin 2000 bolus - Venofer 100 x 10 ordered - Mircera 100 q 2 weeks (last 1/19) - Hgb 10.5 on 2/11 - Hectoral 18mcg IV q HD  Assessment/Plan: 1. Pulm edema:S/p HD on 2/15 - 2.5L net UF. Dyspnea improved/resolved. 2. Vomiting: Unclear cause at this time. Initial cardiac work-up (Trop/EKG) ok. Lipase high, but abd CT without pancreatitis or other acute abnormalities aside from fluid overload. BCx negative. Gastric emptying study negative. DDx: viral, GERD, angina equiv? 3. ESRD:Back to TTS schedule, HD today - probing EDW down as tolerated. 4. Hypertension/volume:BPremains high - doesn't appear overloaded at this time, although will continue to challenge EDW prn. On amlod alone -> dose just increased. Could add BB prn.  5. Anemia:Hgb10.6. Follow for now. 6. Metabolic bone disease:CorrCa/Phos ok - follow for  now. 7. Dispo: Ok to discharge from renal standpoint, weather permitting.  Veneta Penton, PA-C 06/12/2019, 9:59 AM  [facility] Pager: [phone]

## 2019-06-12 NOTE — Progress Notes (Signed)
DISCHARGE NOTE SNF [facility] to be discharged Home per MD order. Patient verbalized understanding.  Skin clean, dry and intact without evidence of skin break down, no evidence of skin tears noted. IV catheter discontinued intact. Site without signs and symptoms of complications. Dressing and pressure applied. Pt denies pain at the site currently. No complaints noted.  Patient free of lines, drains, and wounds.   Discharge packet assembled. An After Visit Summary (AVS) was printed and given to the EMS personnel. Patient escorted via stretcher and discharged to [facility] via ambulance. Report called to accepting facility; all questions and concerns addressed.   Arlyss Repress, RN

## 2019-06-13 ENCOUNTER — Ambulatory Visit (HOSPITAL_COMMUNITY): Payer: Self-pay

## 2019-06-13 ENCOUNTER — Telehealth: Payer: Self-pay | Admitting: Nephrology

## 2019-06-14 LAB — CULTURE, BLOOD (ROUTINE X 2)
Culture: NO GROWTH
Culture: NO GROWTH
Special Requests: ADEQUATE
Special Requests: ADEQUATE

## 2019-07-29 ENCOUNTER — Other Ambulatory Visit: Payer: Self-pay | Admitting: *Deleted

## 2019-07-29 DIAGNOSIS — N186 End stage renal disease: Secondary | ICD-10-CM

## 2019-07-29 DIAGNOSIS — Z992 Dependence on renal dialysis: Secondary | ICD-10-CM

## 2019-07-30 ENCOUNTER — Other Ambulatory Visit: Payer: Self-pay

## 2019-07-30 ENCOUNTER — Ambulatory Visit (HOSPITAL_COMMUNITY)
Admission: RE | Admit: 2019-07-30 | Discharge: 2019-07-30 | Disposition: A | Discharge status: Home / Self Care | Payer: Self-pay | Source: Ambulatory Visit | Attending: Vascular Surgery | Admitting: Vascular Surgery

## 2019-07-30 ENCOUNTER — Ambulatory Visit (INDEPENDENT_AMBULATORY_CARE_PROVIDER_SITE_OTHER): Payer: Self-pay | Admitting: Physician Assistant

## 2019-07-30 VITALS — BP 185/79 | HR 77 | Resp 16

## 2019-07-30 DIAGNOSIS — Z992 Dependence on renal dialysis: Secondary | ICD-10-CM

## 2019-07-30 DIAGNOSIS — N186 End stage renal disease: Secondary | ICD-10-CM

## 2019-07-30 NOTE — Progress Notes (Signed)
POST OPERATIVE OFFICE NOTE    CC:  F/u for surgery  HPI:  This is a 69 y.o. female who is s/p Right brachial artery endarterectomy and Right brachial cephalic AV fistula creation.  He is here today for f/u and fistula duplex to check for maturity.    She denise pain but does have numbness and minimal wekness in the right hand.  She is on dialysis via tunneled dialysis catheter placed by IR on 04/13/19.    No Known Allergies  Current Outpatient Medications  Medication Sig Dispense Refill  . amLODipine (NORVASC) 10 MG tablet Take 1 tablet (10 mg total) by mouth daily. 30 tablet 0  . amLODipine (NORVASC) 5 MG tablet Take 1 tablet (5 mg total) by mouth daily. 30 tablet 3  . calcium carbonate (TUMS) 500 MG chewable tablet Chew 1 tablet (200 mg of elemental calcium total) by mouth 3 (three) times daily. 90 tablet 3  . calcium-vitamin D (OSCAL WITH D) 500-200 MG-UNIT tablet Take 1 tablet by mouth daily with breakfast.    . furosemide (LASIX) 80 MG tablet Take 1 tablet (80 mg total) by mouth 2 (two) times daily. 60 tablet 3  . ondansetron (ZOFRAN ODT) 4 MG disintegrating tablet Take 1 tablet (4 mg total) by mouth every 8 (eight) hours as needed for nausea or vomiting. 20 tablet 0  . PRESCRIPTION MEDICATION Inject 6 Units into the skin daily. Medication:Insulin Vial    . PRESCRIPTION MEDICATION Inject 5 Units into the skin daily. Insulin comes from "[place]" however, patient does not know the name of the insulin. Patient states she took 5 units yesterday.     No current facility-administered medications for this visit.     ROS:  See HPI  Physical Exam:  Vitals:   07/30/19 1533  BP: (!) 185/79  Pulse: 77  Resp: 16  SpO2: 99%     Incision:  Well healed incision with excellent palpable thrill in the fistula. Extremities:  Grip right 4-/5, sensation decreased in the right compared to left hand.  Cap refill brisk, doppler radial and palmer intact.  Hands are warm and equal B.      Findings:  +--------------------+----------+-----------------+--------+  AVF         PSV (cm/s)Flow Vol (mL/min)Comments  +--------------------+----------+-----------------+--------+  Native artery inflow  487     1052          +--------------------+----------+-----------------+--------+  AVF Anastomosis     568                 +--------------------+----------+-----------------+--------+     +------------+----------+-------------+----------+--------+  OUTFLOW VEINPSV (cm/s)Diameter (cm)Depth (cm)Describe  +------------+----------+-------------+----------+--------+  Prox UA     116    0.72     0.47        +------------+----------+-------------+----------+--------+  Mid UA     127    0.59     0.18        +------------+----------+-------------+----------+--------+  Dist UA     160    0.66     0.17        +------------+----------+-------------+----------+--------+  AC Fossa    607    0.72     0.31        +------------+----------+-------------+----------+--------+  Rt brachial artery peak systolic velocity increases from 212 cm/sec to 487  cm/sec just proximal to the AVF anastomosis.       Summary:  Arteriovenous fistula-Stenosis noted in the Right brachial artery.  Patent right arm brachio-cephalic AVF with peak systolic velocity  increases in the right  brachial inflow artery consistent with >50%  stenosis.   Assessment/Plan:  This is a 69 y.o. female who is s/p: Procedure Performed: 1.  Right brachial artery endarterectomy 2.  Right brachial cephalic AV fistula creation  She has tolerable signs of steal with decreased sensation and motor, but no pain.  It has been almost 3 months since the surgery and her function has been maintained.  The fistula has good diameter and depth.    We spoke using an interpreter IPAD and decided to  give her a squeeze ball and she will return for a repeat duplex and exam.  I'm not sure if the anastomosis is narrowed or if the endarterectomy is distorting  the image?    She may benefit from a fistulogram verse ligation of the fistula.   Roxy Horseman PA-C [facility] [phone]  Clinic MD:  Scot Dock

## 2019-07-31 ENCOUNTER — Other Ambulatory Visit: Payer: Self-pay | Admitting: *Deleted

## 2019-07-31 DIAGNOSIS — Z992 Dependence on renal dialysis: Secondary | ICD-10-CM

## 2019-07-31 DIAGNOSIS — N186 End stage renal disease: Secondary | ICD-10-CM

## 2019-08-12 ENCOUNTER — Ambulatory Visit: Payer: Self-pay

## 2019-08-12 ENCOUNTER — Inpatient Hospital Stay (HOSPITAL_COMMUNITY): Admission: RE | Admit: 2019-08-12 | Payer: Self-pay | Source: Ambulatory Visit

## 2019-08-14 ENCOUNTER — Emergency Department (HOSPITAL_COMMUNITY)
Admission: EM | Admit: 2019-08-14 | Discharge: 2019-08-14 | Disposition: A | Discharge status: Home / Self Care | Payer: Self-pay | Attending: Emergency Medicine | Admitting: Emergency Medicine

## 2019-08-14 ENCOUNTER — Emergency Department (HOSPITAL_COMMUNITY): Payer: Self-pay

## 2019-08-14 DIAGNOSIS — N186 End stage renal disease: Secondary | ICD-10-CM | POA: Insufficient documentation

## 2019-08-14 DIAGNOSIS — Z79899 Other long term (current) drug therapy: Secondary | ICD-10-CM | POA: Insufficient documentation

## 2019-08-14 DIAGNOSIS — I132 Hypertensive heart and chronic kidney disease with heart failure and with stage 5 chronic kidney disease, or end stage renal disease: Secondary | ICD-10-CM | POA: Insufficient documentation

## 2019-08-14 DIAGNOSIS — I5032 Chronic diastolic (congestive) heart failure: Secondary | ICD-10-CM | POA: Insufficient documentation

## 2019-08-14 DIAGNOSIS — R531 Weakness: Secondary | ICD-10-CM | POA: Insufficient documentation

## 2019-08-14 DIAGNOSIS — Z992 Dependence on renal dialysis: Secondary | ICD-10-CM | POA: Insufficient documentation

## 2019-08-14 DIAGNOSIS — I10 Essential (primary) hypertension: Secondary | ICD-10-CM

## 2019-08-14 DIAGNOSIS — E1122 Type 2 diabetes mellitus with diabetic chronic kidney disease: Secondary | ICD-10-CM | POA: Insufficient documentation

## 2019-08-14 LAB — COMPREHENSIVE METABOLIC PANEL
ALT: 28 U/L (ref 0–44)
AST: 39 U/L (ref 15–41)
Albumin: 3.5 g/dL (ref 3.5–5.0)
Alkaline Phosphatase: 161 U/L — ABNORMAL HIGH (ref 38–126)
Anion gap: 12 (ref 5–15)
BUN: 9 mg/dL (ref 8–23)
CO2: 26 mmol/L (ref 22–32)
Calcium: 7.9 mg/dL — ABNORMAL LOW (ref 8.9–10.3)
Chloride: 92 mmol/L — ABNORMAL LOW (ref 98–111)
Creatinine, Ser: 1.71 mg/dL — ABNORMAL HIGH (ref 0.44–1.00)
GFR calc Af Amer: 35 mL/min — ABNORMAL LOW (ref >60)
GFR calc non Af Amer: 30 mL/min — ABNORMAL LOW (ref >60)
Glucose, Bld: 236 mg/dL — ABNORMAL HIGH (ref 70–99)
Potassium: 3.5 mmol/L (ref 3.5–5.1)
Sodium: 130 mmol/L — ABNORMAL LOW (ref 135–145)
Total Bilirubin: 0.5 mg/dL (ref 0.3–1.2)
Total Protein: 6.9 g/dL (ref 6.5–8.1)

## 2019-08-14 LAB — CBC WITH DIFFERENTIAL/PLATELET
Abs Immature Granulocytes: 0.01 10*3/uL (ref 0.00–0.07)
Basophils Absolute: 0.1 10*3/uL (ref 0.0–0.1)
Basophils Relative: 1 %
Eosinophils Absolute: 0.1 10*3/uL (ref 0.0–0.5)
Eosinophils Relative: 1 %
HCT: 40 % (ref 36.0–46.0)
Hemoglobin: 12.3 g/dL (ref 12.0–15.0)
Immature Granulocytes: 0 %
Lymphocytes Relative: 11 %
Lymphs Abs: 0.6 10*3/uL — ABNORMAL LOW (ref 0.7–4.0)
MCH: 26.4 pg (ref 26.0–34.0)
MCHC: 30.8 g/dL (ref 30.0–36.0)
MCV: 85.8 fL (ref 80.0–100.0)
Monocytes Absolute: 0.5 10*3/uL (ref 0.1–1.0)
Monocytes Relative: 9 %
Neutro Abs: 4.1 10*3/uL (ref 1.7–7.7)
Neutrophils Relative %: 78 %
Platelets: 168 10*3/uL (ref 150–400)
RBC: 4.66 MIL/uL (ref 3.87–5.11)
RDW: 15.9 % — ABNORMAL HIGH (ref 11.5–15.5)
WBC: 5.2 10*3/uL (ref 4.0–10.5)
nRBC: 0 % (ref 0.0–0.2)

## 2019-08-14 LAB — CBG MONITORING, ED: Glucose-Capillary: 224 mg/dL — ABNORMAL HIGH (ref 70–99)

## 2019-08-14 LAB — AMMONIA: Ammonia: 42 umol/L — ABNORMAL HIGH (ref 9–35)

## 2019-08-14 NOTE — ED Provider Notes (Signed)
  Face-to-face evaluation   History: Patient here for evaluation of confusion after dialysis today.  She was started on dialysis about 4 months ago.  She is eating well.  There has been no cough or fever.  She had some vomiting yesterday and the day before and is recovering from a diarrhea episode that occurred over the last week.  She thinks she had some carrot juice which caused her diarrhea.  She is with her son who wants to assume care for his mother, from his brother who the son states is not taking good care of his mother.  Physical exam: Alert elderly patient.  She is edentulous.  No dysarthria or aphasia.  She is lucid and appears comfortable.  Medical screening examination/treatment/procedure(s) were conducted as a shared visit with non-physician practitioner(s) and myself.  I personally evaluated the patient during the encounter    Daleen Bo, MD 08/14/19 2312

## 2019-08-14 NOTE — ED Triage Notes (Signed)
Pt BIB [facility] from dialysis for AMS, generalized weakness, post treatment. Pt alert to voice, answers some questions appropriately, lethargic, confused. A&O at baseline. Hypertensive per EMS. CBG 281, no hx DM. NAD noted

## 2019-08-14 NOTE — ED Provider Notes (Signed)
[facility] EMERGENCY DEPARTMENT Provider Note   CSN: [id number] Arrival date & time: 08/14/19  1717     History Chief Complaint  Patient presents with  . Altered Mental Status    Amber Dixon is a 69 y.o. female with a past medical history of DM 2, hypertension, ESRD on HD who presents today from dialysis for reportedly being altered after dialysis today.  She is primarily Spanish-speaking.  She reports that she was given a vaccine today at dialysis, review shows that it was hepatitis B vaccine.  She reports that she feels generally unwell.  She denies any fevers.  She does report nausea and vomiting along with diarrhea. In addition she has been out of her blood pressure medicines.  She has been instead taking a blood pressure medicine from [place].  She is unsure the name of this.  She takes insulin for diabetes.  On chart review this appears to also come from [place].  No fevers.  No headache.  History obtained through professional [place] speaking interpreter, chart review and interactions with patient and her son.    HPI     Past Medical History:  Diagnosis Date  . Diabetes mellitus without complication ([place])   . Hypertension   . Renal disorder     Patient Active Problem List   Diagnosis Date Noted  . ESRD (end stage renal disease) ([place])   . Fluid overload 06/09/2019  . Chest pain 06/09/2019  . Elevated troponin 06/09/2019  . Diabetes mellitus type 2 in nonobese ([place]) 06/09/2019  . Anemia of chronic disease 06/09/2019  . Nausea and vomiting 06/09/2019  . Pulmonary edema 06/09/2019  . ESRD (end stage renal disease) on dialysis ([place]) 04/10/2019  . Acute respiratory failure with hypoxemia ([place]) 03/31/2019  . Acute on chronic diastolic (congestive) heart failure ([place]) 03/24/2019  . Hypertension 03/22/2019  . Anemia of renal disease 03/22/2019  . Proteinuria 03/22/2019  . Chest pain 03/22/2019  . Acute on chronic renal failure  ([place]) 03/21/2019    Past Surgical History:  Procedure Laterality Date  . AV FISTULA PLACEMENT Right 04/24/2019   Procedure: ARTERIOVENOUS (AV) FISTULA CREATION RIGHT ARM;  Surgeon: Waynetta Sandy, MD;  Location: [facility];  Service: Vascular;  Laterality: Right;  . AV FISTULA PLACEMENT Right   . HIP FRACTURE SURGERY Right    in [place]  . IR FLUORO GUIDE CV LINE RIGHT  04/02/2019  . IR FLUORO GUIDE CV LINE RIGHT  04/13/2019  . IR US GUIDE VASC ACCESS RIGHT  04/02/2019  . LEG SURGERY       OB History   No obstetric history on file.     No family history on file.  Social History   Tobacco Use  . Smoking status: Never Smoker  . Smokeless tobacco: Never Used  Substance Use Topics  . Alcohol use: Not on file  . Drug use: Not Currently    Home Medications Prior to Admission medications   Medication Sig Start Date End Date Taking? Authorizing Provider  amLODipine (NORVASC) 10 MG tablet Take 1 tablet (10 mg total) by mouth daily. 06/12/19   Lorella Nimrod, MD  calcium carbonate (TUMS) 500 MG chewable tablet Chew 1 tablet (200 mg of elemental calcium total) by mouth 3 (three) times daily. 03/25/19 03/24/20  Al Decant, MD  calcium-vitamin D (OSCAL WITH D) 500-200 MG-UNIT tablet Take 1 tablet by mouth daily with breakfast.    [provider]  furosemide (LASIX) 80 MG tablet Take  1 tablet (80 mg total) by mouth 2 (two) times daily. 03/25/19 04/24/19  Al Decant, MD  ondansetron (ZOFRAN ODT) 4 MG disintegrating tablet Take 1 tablet (4 mg total) by mouth every 8 (eight) hours as needed for nausea or vomiting. 04/25/19   Jean Rosenthal, MD  PRESCRIPTION MEDICATION Inject 6 Units into the skin daily. Medication:Insulin Vial    [provider]  PRESCRIPTION MEDICATION Inject 5 Units into the skin daily. Insulin comes from "[place]" however, patient does not know the name of the insulin. Patient states she took 5 units yesterday.    [provider]     Allergies    Patient has no known allergies.  Review of Systems   Review of Systems  Constitutional: Negative for chills and fever.  Respiratory: Negative for chest tightness and shortness of breath.   Cardiovascular: Negative for chest pain.  Gastrointestinal: Negative for abdominal pain.  Musculoskeletal: Negative for back pain.  Neurological: Positive for weakness (Generalized, body wide).  Psychiatric/Behavioral: Positive for confusion (Slightly confused answering questions briefly after dialysis).  All other systems reviewed and are negative.   Physical Exam Updated Vital Signs BP (!) 161/95   Pulse 82   Temp 98.6 F (37 C) (Oral)   Resp (!) 21   SpO2 100%   Physical Exam Vitals and nursing note reviewed.  Constitutional:      General: She is not in acute distress.    Appearance: She is well-developed. She is not diaphoretic.  HENT:     Head: Normocephalic and atraumatic.     Comments: Patient is edentulous, facial movements are symmetric    Mouth/Throat:     Mouth: Mucous membranes are moist.  Eyes:     General: No scleral icterus.       Right eye: No discharge.        Left eye: No discharge.     Conjunctiva/sclera: Conjunctivae normal.  Cardiovascular:     Rate and Rhythm: Normal rate and regular rhythm.     Pulses: Normal pulses.     Heart sounds: Normal heart sounds.  Pulmonary:     Effort: Pulmonary effort is normal. No respiratory distress.     Breath sounds: Normal breath sounds. No stridor.  Abdominal:     General: There is no distension.     Tenderness: There is no abdominal tenderness. There is no guarding.  Musculoskeletal:        General: No deformity.     Cervical back: Normal range of motion and neck supple.     Right lower leg: No edema.     Left lower leg: No edema.  Skin:    General: Skin is warm and dry.  Neurological:     General: No focal deficit present.     Mental Status: She is alert.     Sensory: No sensory deficit.      Motor: No abnormal muscle tone.     Coordination: Coordination normal.     Comments: Patient is awake and alert.  She is able to answer questions and converse with her son without significant difficulty.  Her speech is not slurred.  Facial movements are symmetric.  Movement of bilateral arms and legs with out difficulty.    Psychiatric:        Behavior: Behavior normal.     ED Results / Procedures / Treatments   Labs (all labs ordered are listed, but only abnormal results are displayed) Labs Reviewed  COMPREHENSIVE METABOLIC PANEL - Abnormal;  Notable for the following components:      Result Value   Sodium 130 (*)    Chloride 92 (*)    Glucose, Bld 236 (*)    Creatinine, Ser 1.71 (*)    Calcium 7.9 (*)    Alkaline Phosphatase 161 (*)    GFR calc non Af Amer 30 (*)    GFR calc Af Amer 35 (*)    All other components within normal limits  CBC WITH DIFFERENTIAL/PLATELET - Abnormal; Notable for the following components:   RDW 15.9 (*)    Lymphs Abs 0.6 (*)    All other components within normal limits  AMMONIA - Abnormal; Notable for the following components:   Ammonia 42 (*)    All other components within normal limits  CBG MONITORING, ED - Abnormal; Notable for the following components:   Glucose-Capillary 224 (*)    All other components within normal limits    EKG EKG Interpretation  Date/Time:  Thursday August 14 2019 18:08:21 EDT Ventricular Rate:  82 PR Interval:    QRS Duration: 84 QT Interval:  414 QTC Calculation: 484 R Axis:   84 Text Interpretation: Sinus rhythm Borderline short PR interval Borderline right axis deviation RSR' in V1 or V2, probably normal variant Probable left ventricular hypertrophy Baseline wander in lead(s) V2 since last tracing no significant change Confirmed by Daleen Bo [phone]) on 08/14/2019 6:15:13 PM   Radiology DG Chest Port 1 View  Result Date: 08/14/2019 CLINICAL DATA:  Altered mental status. EXAM: PORTABLE CHEST 1 VIEW  COMPARISON:  Radiograph 04/05/2019 FINDINGS: Right-sided dialysis catheter tip in the atrial caval junction. Unchanged cardiomegaly. Unchanged mediastinal contours. Improved heterogeneous opacities from prior exam, likely improving pulmonary edema. Mild residual peribronchial thickening. Suspected small pleural effusions. No pneumothorax. No acute osseous abnormalities are seen. IMPRESSION: 1. Improved heterogeneous opacities from prior exam, likely improving pulmonary edema. Mild peribronchial thickening may be persistent or recurrent edema or bronchitic inflammation. 2. Suspected small pleural effusions. 3. Unchanged cardiomegaly. Electronically Signed   By: Keith Rake M.D.   On: 08/14/2019 19:39    Procedures Procedures (including critical care time)  Medications Ordered in ED Medications - No data to display  ED Course  I have reviewed the triage vital signs and the nursing notes.  Pertinent labs & imaging results that were available during my care of the patient were reviewed by me and considered in my medical decision making (see chart for details).    MDM Rules/Calculators/A&P                     Patient is a 69 year old Spanish-speaking woman who presents today for evaluation of brief confusion after dialysis.  She was reportedly not answering questions appropriately and was anxious per dialysis.  She was given a hepatitis B vaccine and reports that her arm is still burning.  She denies any shortness of breath.  Here she is edentulous, however her speech is not slurred.  She is able to converse with her son without significant difficulty.  She does not appear to be in respiratory distress.  Labs are obtained and reviewed she does not have any significant acute hematologic or electrolyte derangements, other than ammonia being minimally elevated at 42.  Her AST, ALT alk phos and total bilirubin are not significantly elevated.  Recommend outpatient follow-up for this.  No other evidence  of liver disease.  Given that it is minimally elevated I do not think this is clinically significant today.  Patient able to PO challenge.   This patient was seen as a shared visit with Dr. Eulis Foster.   Patients son reports that he has a PCP appointment scheduled for the patient, and they have been off amlodipine to last until she follows up with the primary care doctor.  We discussed the importance of taking the amlodipine daily.  Return precautions were discussed with patient who states their understanding.  At the time of discharge patient denied any unaddressed complaints or concerns.  Patient is agreeable for discharge home.  Note: Portions of this report may have been transcribed using voice recognition software. Every effort was made to ensure accuracy; however, inadvertent computerized transcription errors may be present   Final Clinical Impression(s) / ED Diagnoses Final diagnoses:  Hypertension, unspecified type    Rx / DC Orders ED Discharge Orders    Amber Dixon       Ollen Gross 08/14/19 2110    Daleen Bo, MD 08/14/19 2311    Daleen Bo, MD 08/14/19 2312

## 2019-08-16 ENCOUNTER — Inpatient Hospital Stay
Admission: EM | Admit: 2019-08-16 | Discharge: 2019-09-05 | DRG: 871 | Disposition: A | Discharge status: Home Health | Payer: Medicaid Other | Attending: Family Medicine | Admitting: Family Medicine

## 2019-08-16 ENCOUNTER — Emergency Department: Payer: Medicaid Other

## 2019-08-16 ENCOUNTER — Other Ambulatory Visit: Payer: Self-pay

## 2019-08-16 DIAGNOSIS — G9340 Encephalopathy, unspecified: Secondary | ICD-10-CM | POA: Diagnosis present

## 2019-08-16 DIAGNOSIS — M8580 Other specified disorders of bone density and structure, unspecified site: Secondary | ICD-10-CM | POA: Diagnosis present

## 2019-08-16 DIAGNOSIS — R0902 Hypoxemia: Secondary | ICD-10-CM

## 2019-08-16 DIAGNOSIS — E11649 Type 2 diabetes mellitus with hypoglycemia without coma: Secondary | ICD-10-CM | POA: Diagnosis not present

## 2019-08-16 DIAGNOSIS — R4182 Altered mental status, unspecified: Secondary | ICD-10-CM

## 2019-08-16 DIAGNOSIS — E1165 Type 2 diabetes mellitus with hyperglycemia: Secondary | ICD-10-CM | POA: Diagnosis not present

## 2019-08-16 DIAGNOSIS — Z8673 Personal history of transient ischemic attack (TIA), and cerebral infarction without residual deficits: Secondary | ICD-10-CM

## 2019-08-16 DIAGNOSIS — J9601 Acute respiratory failure with hypoxia: Secondary | ICD-10-CM | POA: Diagnosis not present

## 2019-08-16 DIAGNOSIS — T3695XA Adverse effect of unspecified systemic antibiotic, initial encounter: Secondary | ICD-10-CM | POA: Diagnosis not present

## 2019-08-16 DIAGNOSIS — A419 Sepsis, unspecified organism: Principal | ICD-10-CM

## 2019-08-16 DIAGNOSIS — Z515 Encounter for palliative care: Secondary | ICD-10-CM | POA: Diagnosis not present

## 2019-08-16 DIAGNOSIS — Y844 Aspiration of fluid as the cause of abnormal reaction of the patient, or of later complication, without mention of misadventure at the time of the procedure: Secondary | ICD-10-CM | POA: Diagnosis not present

## 2019-08-16 DIAGNOSIS — I120 Hypertensive chronic kidney disease with stage 5 chronic kidney disease or end stage renal disease: Secondary | ICD-10-CM | POA: Diagnosis present

## 2019-08-16 DIAGNOSIS — I160 Hypertensive urgency: Secondary | ICD-10-CM

## 2019-08-16 DIAGNOSIS — E871 Hypo-osmolality and hyponatremia: Secondary | ICD-10-CM | POA: Diagnosis present

## 2019-08-16 DIAGNOSIS — R509 Fever, unspecified: Secondary | ICD-10-CM

## 2019-08-16 DIAGNOSIS — E1122 Type 2 diabetes mellitus with diabetic chronic kidney disease: Secondary | ICD-10-CM | POA: Diagnosis present

## 2019-08-16 DIAGNOSIS — Z79899 Other long term (current) drug therapy: Secondary | ICD-10-CM

## 2019-08-16 DIAGNOSIS — Z681 Body mass index (BMI) 19 or less, adult: Secondary | ICD-10-CM

## 2019-08-16 DIAGNOSIS — E875 Hyperkalemia: Secondary | ICD-10-CM | POA: Diagnosis not present

## 2019-08-16 DIAGNOSIS — N2581 Secondary hyperparathyroidism of renal origin: Secondary | ICD-10-CM | POA: Diagnosis present

## 2019-08-16 DIAGNOSIS — Z992 Dependence on renal dialysis: Secondary | ICD-10-CM | POA: Diagnosis not present

## 2019-08-16 DIAGNOSIS — N186 End stage renal disease: Secondary | ICD-10-CM | POA: Diagnosis present

## 2019-08-16 DIAGNOSIS — K521 Toxic gastroenteritis and colitis: Secondary | ICD-10-CM | POA: Diagnosis not present

## 2019-08-16 DIAGNOSIS — I161 Hypertensive emergency: Secondary | ICD-10-CM | POA: Diagnosis present

## 2019-08-16 DIAGNOSIS — E43 Unspecified severe protein-calorie malnutrition: Secondary | ICD-10-CM | POA: Diagnosis present

## 2019-08-16 DIAGNOSIS — Z4659 Encounter for fitting and adjustment of other gastrointestinal appliance and device: Secondary | ICD-10-CM

## 2019-08-16 DIAGNOSIS — Z20822 Contact with and (suspected) exposure to covid-19: Secondary | ICD-10-CM | POA: Diagnosis present

## 2019-08-16 DIAGNOSIS — R109 Unspecified abdominal pain: Secondary | ICD-10-CM

## 2019-08-16 DIAGNOSIS — G9341 Metabolic encephalopathy: Secondary | ICD-10-CM | POA: Diagnosis present

## 2019-08-16 DIAGNOSIS — D631 Anemia in chronic kidney disease: Secondary | ICD-10-CM | POA: Diagnosis present

## 2019-08-16 DIAGNOSIS — Z7189 Other specified counseling: Secondary | ICD-10-CM

## 2019-08-16 DIAGNOSIS — R140 Abdominal distension (gaseous): Secondary | ICD-10-CM

## 2019-08-16 LAB — COMPREHENSIVE METABOLIC PANEL
ALT: 31 U/L (ref 0–44)
AST: 48 U/L — ABNORMAL HIGH (ref 15–41)
Albumin: 4.2 g/dL (ref 3.5–5.0)
Alkaline Phosphatase: 155 U/L — ABNORMAL HIGH (ref 38–126)
Anion gap: 10 (ref 5–15)
BUN: 14 mg/dL (ref 8–23)
CO2: 32 mmol/L (ref 22–32)
Calcium: 8.6 mg/dL — ABNORMAL LOW (ref 8.9–10.3)
Chloride: 92 mmol/L — ABNORMAL LOW (ref 98–111)
Creatinine, Ser: 1.88 mg/dL — ABNORMAL HIGH (ref 0.44–1.00)
GFR calc Af Amer: 31 mL/min — ABNORMAL LOW (ref >60)
GFR calc non Af Amer: 27 mL/min — ABNORMAL LOW (ref >60)
Glucose, Bld: 224 mg/dL — ABNORMAL HIGH (ref 70–99)
Potassium: 3.8 mmol/L (ref 3.5–5.1)
Sodium: 134 mmol/L — ABNORMAL LOW (ref 135–145)
Total Bilirubin: 0.4 mg/dL (ref 0.3–1.2)
Total Protein: 7.8 g/dL (ref 6.5–8.1)

## 2019-08-16 LAB — CBC WITH DIFFERENTIAL/PLATELET
Abs Immature Granulocytes: 0.03 10*3/uL (ref 0.00–0.07)
Basophils Absolute: 0 10*3/uL (ref 0.0–0.1)
Basophils Relative: 1 %
Eosinophils Absolute: 0 10*3/uL (ref 0.0–0.5)
Eosinophils Relative: 1 %
HCT: 38 % (ref 36.0–46.0)
Hemoglobin: 12.1 g/dL (ref 12.0–15.0)
Immature Granulocytes: 1 %
Lymphocytes Relative: 11 %
Lymphs Abs: 0.7 10*3/uL (ref 0.7–4.0)
MCH: 26.2 pg (ref 26.0–34.0)
MCHC: 31.8 g/dL (ref 30.0–36.0)
MCV: 82.4 fL (ref 80.0–100.0)
Monocytes Absolute: 0.5 10*3/uL (ref 0.1–1.0)
Monocytes Relative: 7 %
Neutro Abs: 5.1 10*3/uL (ref 1.7–7.7)
Neutrophils Relative %: 79 %
Platelets: 215 10*3/uL (ref 150–400)
RBC: 4.61 MIL/uL (ref 3.87–5.11)
RDW: 15.4 % (ref 11.5–15.5)
WBC: 6.4 10*3/uL (ref 4.0–10.5)
nRBC: 0 % (ref 0.0–0.2)

## 2019-08-16 LAB — LACTIC ACID, PLASMA
Lactic Acid, Venous: 2 mmol/L (ref 0.5–1.9)
Lactic Acid, Venous: 1.7 mmol/L (ref 0.5–1.9)

## 2019-08-16 LAB — URINALYSIS, ROUTINE W REFLEX MICROSCOPIC
Bilirubin Urine: NEGATIVE
Glucose, UA: 250 mg/dL — AB
Ketones, ur: NEGATIVE mg/dL
Leukocytes,Ua: NEGATIVE
Nitrite: NEGATIVE
Protein, ur: >300 mg/dL — AB
Specific Gravity, Urine: 1.02 (ref 1.005–1.030)
pH: 8.5 — ABNORMAL HIGH (ref 5.0–8.0)

## 2019-08-16 LAB — URINALYSIS, MICROSCOPIC (REFLEX): Bacteria, UA: NONE SEEN

## 2019-08-16 LAB — RESPIRATORY PANEL BY RT PCR (FLU A&B, COVID)
Influenza A by PCR: NEGATIVE
Influenza B by PCR: NEGATIVE
SARS Coronavirus 2 by RT PCR: NEGATIVE

## 2019-08-16 LAB — PROTIME-INR
INR: 0.9 (ref 0.8–1.2)
Prothrombin Time: 12.1 seconds (ref 11.4–15.2)

## 2019-08-16 MED ORDER — METHYLPREDNISOLONE SODIUM SUCC 125 MG IJ SOLR
125.0000 mg | Freq: Once | INTRAMUSCULAR | Status: AC
  Administered 2019-08-16: 21:00:00 125 mg via INTRAVENOUS
  Filled 2019-08-16: qty 2

## 2019-08-16 MED ORDER — ACETAMINOPHEN 650 MG RE SUPP
650.0000 mg | Freq: Once | RECTAL | Status: AC
  Administered 2019-08-16: 650 mg via RECTAL
  Filled 2019-08-16: qty 1

## 2019-08-16 MED ORDER — VANCOMYCIN HCL IN DEXTROSE 1-5 GM/200ML-% IV SOLN
1000.0000 mg | Freq: Once | INTRAVENOUS | Status: AC
  Administered 2019-08-16: 22:00:00 1000 mg via INTRAVENOUS
  Filled 2019-08-16: qty 200

## 2019-08-16 MED ORDER — LABETALOL HCL 5 MG/ML IV SOLN
10.0000 mg | Freq: Once | INTRAVENOUS | Status: AC
  Administered 2019-08-16: 22:00:00 10 mg via INTRAVENOUS
  Filled 2019-08-16: qty 4

## 2019-08-16 MED ORDER — DIPHENHYDRAMINE HCL 50 MG/ML IJ SOLN
50.0000 mg | Freq: Once | INTRAMUSCULAR | Status: AC
  Administered 2019-08-16: 21:00:00 50 mg via INTRAVENOUS
  Filled 2019-08-16: qty 1

## 2019-08-16 MED ORDER — PIPERACILLIN-TAZOBACTAM 3.375 G IVPB 30 MIN
3.3750 g | Freq: Once | INTRAVENOUS | Status: AC
  Administered 2019-08-16: 3.375 g via INTRAVENOUS
  Filled 2019-08-16: qty 50

## 2019-08-16 NOTE — ED Notes (Signed)
Date and time results received: 08/16/19 9:05 PM  (use smartphrase ".now" to insert current time)  Test: Lactic Critical Value: 2.0  Name of Provider Notified: Dr. Jimmye Norman   Orders Received? Or Actions Taken?: No new orders at this time

## 2019-08-16 NOTE — ED Notes (Signed)
This RN on phone with pt's daughter. Daughter stating pt had dialysis today and started not to feel well. Pt's daughter stating her tongue started to swell today and pt started shivering. MD made aware.

## 2019-08-16 NOTE — ED Notes (Signed)
This RN at Pt's body positioned with feet pointing out of side rail. Pt repositioned back in bed. Seizure pads placed on bed for safety

## 2019-08-16 NOTE — ED Notes (Addendum)
This RN and CT at bedside. Pt actively vomiting. Pt suctioned at this time

## 2019-08-16 NOTE — ED Provider Notes (Signed)
[facility] Emergency Department Provider Note       Time seen: ----------------------------------------- 8:23 PM on 08/16/2019 ----------------------------------------- Level V caveat: History/ROS limited by altered mental status  I have reviewed the triage vital signs and the nursing notes.  HISTORY   Chief Complaint Blood Infection (possible sepsis)   HPI Vanesa Renier is a 69 y.o. female with a history of diabetes, hypertension, renal disorder on dialysis who presents to the ED for possible sepsis.  Patient had dialysis, was recently seen at [facility] for nausea, vomiting and diarrhea.  She also had some tongue swelling, recently received the first hepatitis B vaccine.  She has been tachycardic and hypertensive.  No further information is available.  Past Medical History:  Diagnosis Date  . Diabetes mellitus without complication ([place])   . Hypertension   . Renal disorder     Patient Active Problem List   Diagnosis Date Noted  . ESRD (end stage renal disease) ([place])   . Fluid overload 06/09/2019  . Chest pain 06/09/2019  . Elevated troponin 06/09/2019  . Diabetes mellitus type 2 in nonobese ([place]) 06/09/2019  . Anemia of chronic disease 06/09/2019  . Nausea and vomiting 06/09/2019  . Pulmonary edema 06/09/2019  . ESRD (end stage renal disease) on dialysis ([place]) 04/10/2019  . Acute respiratory failure with hypoxemia ([place]) 03/31/2019  . Acute on chronic diastolic (congestive) heart failure ([place]) 03/24/2019  . Hypertension 03/22/2019  . Anemia of renal disease 03/22/2019  . Proteinuria 03/22/2019  . Chest pain 03/22/2019  . Acute on chronic renal failure ([place]) 03/21/2019    Past Surgical History:  Procedure Laterality Date  . AV FISTULA PLACEMENT Right 04/24/2019   Procedure: ARTERIOVENOUS (AV) FISTULA CREATION RIGHT ARM;  Surgeon: Waynetta Sandy, MD;  Location: [place];  Service: Vascular;  Laterality: Right;  . AV FISTULA  PLACEMENT Right   . HIP FRACTURE SURGERY Right    in [place]  . IR FLUORO GUIDE CV LINE RIGHT  04/02/2019  . IR FLUORO GUIDE CV LINE RIGHT  04/13/2019  . IR US GUIDE VASC ACCESS RIGHT  04/02/2019  . LEG SURGERY      Allergies Patient has no known allergies.  Social History Social History   Tobacco Use  . Smoking status: Never Smoker  . Smokeless tobacco: Never Used  Substance Use Topics  . Alcohol use: Not on file  . Drug use: Not Currently    Review of Systems Constitutional: Positive for fever HEENT: Positive for tongue swelling Otherwise unknown, patient with altered mental status  All systems negative/normal/unremarkable except as stated in the HPI  ____________________________________________   PHYSICAL EXAM:  VITAL SIGNS: ED Triage Vitals [08/16/19 2022]  Enc Vitals Group     BP (!) 216/111     Pulse Rate (!) 120     Resp (!) 22     Temp (!) 102.2 F (39 C)     Temp Source Oral     SpO2 99 %     Weight      Height      Head Circumference      Peak Flow      Pain Score      Pain Loc      Pain Edu?      Excl. in [place]?     Constitutional: Alert but disoriented, chronically ill-appearing, no distress Eyes: Conjunctivae are normal. Normal extraocular movements. ENT      Head: Normocephalic and atraumatic.  Nose: No congestion/rhinnorhea.      Mouth/Throat: Mucous membranes are moist.  Tongue does appear to be slightly enlarged but she is not having any difficulty swallowing.  She is edentulous      Neck: No stridor. Cardiovascular: Rapid rate, regular rhythm. No murmurs, rubs, or gallops. Respiratory: Normal respiratory effort without tachypnea nor retractions. Breath sounds are clear and equal bilaterally. No wheezes/rales/rhonchi. Gastrointestinal: Soft and nontender. Normal bowel sounds Musculoskeletal: Nontender with normal range of motion in extremities. No lower extremity tenderness nor edema. Neurologic:  No gross focal neurologic deficits are  appreciated.  Patient is confused Skin:  Skin is warm, dry and intact. No rash noted. Psychiatric: Mood and affect are normal. Speech and behavior are normal.  ____________________________________________  EKG: Interpreted by me.  Sinus tachycardia the rate of 116 bpm, normal axis, normal QT, no ST elevation is noted  ____________________________________________  ED COURSE:  As part of my medical decision making, I reviewed the following data within the [place] History obtained from family if available, nursing notes, old chart and ekg, as well as notes from prior ED visits. Patient presented for possible sepsis, we will assess with labs and imaging as indicated at this time.   Procedures  Addelyn Alleman was evaluated in Emergency Department on 08/16/2019 for the symptoms described in the history of present illness. She was evaluated in the context of the global COVID-19 pandemic, which necessitated consideration that the patient might be at risk for infection with the SARS-CoV-2 virus that causes COVID-19. Institutional protocols and algorithms that pertain to the evaluation of patients at risk for COVID-19 are in a state of rapid change based on information released by regulatory bodies including the CDC and federal and state organizations. These policies and algorithms were followed during the patient's care in the ED.  ____________________________________________   LABS (pertinent positives/negatives)  Labs Reviewed  LACTIC ACID, PLASMA - Abnormal; Notable for the following components:      Result Value   Lactic Acid, Venous 2.0 (*)    All other components within normal limits  COMPREHENSIVE METABOLIC PANEL - Abnormal; Notable for the following components:   Sodium 134 (*)    Chloride 92 (*)    Glucose, Bld 224 (*)    Creatinine, Ser 1.88 (*)    Calcium 8.6 (*)    AST 48 (*)    Alkaline Phosphatase 155 (*)    GFR calc non Af Amer 27 (*)    GFR  calc Af Amer 31 (*)    All other components within normal limits  URINALYSIS, ROUTINE W REFLEX MICROSCOPIC - Abnormal; Notable for the following components:   pH 8.5 (*)    Glucose, UA 250 (*)    Hgb urine dipstick TRACE (*)    Protein, ur >300 (*)    All other components within normal limits  RESPIRATORY PANEL BY RT PCR (FLU A&B, COVID)  CULTURE, BLOOD (ROUTINE X 2)  CULTURE, BLOOD (ROUTINE X 2)  URINE CULTURE  CBC WITH DIFFERENTIAL/PLATELET  PROTIME-INR  URINALYSIS, MICROSCOPIC (REFLEX)  LACTIC ACID, PLASMA   CRITICAL CARE Performed by: Laurence Aly   Total critical care time: 30 minutes  Critical care time was exclusive of separately billable procedures and treating other patients.  Critical care was necessary to treat or prevent imminent or life-threatening deterioration.  Critical care was time spent personally by me on the following activities: development of treatment plan with patient and/or surrogate as well as nursing,  discussions with consultants, evaluation of patient's response to treatment, examination of patient, obtaining history from patient or surrogate, ordering and performing treatments and interventions, ordering and review of laboratory studies, ordering and review of radiographic studies, pulse oximetry and re-evaluation of patient's condition.  RADIOLOGY Images were viewed by me  Chest x-ray, CT head  IMPRESSION:  Stable cardiomegaly without evidence of acute or active  cardiopulmonary disease.   IMPRESSION:  1. Stable chronic small vessel ischemic changes. No acute process.  ____________________________________________   DIFFERENTIAL DIAGNOSIS   Sepsis, dehydration, electrolyte abnormality, CVA, MI, angioedema, allergic reaction  FINAL ASSESSMENT AND PLAN  Fever, altered mental status   Plan: The patient had presented for possible sepsis with tongue swelling. Patient's labs did not reveal any specific etiology for her fever altered  mental status. Patient's imaging was reassuring.   She does present with acute delirium, is hypertensive and tachycardic.  She has received broad-spectrum antibiotics and cultures have been obtained.  She is Covid negative.  I will discuss with the hospitalist for admission.   Laurence Aly, MD    Note: This note was generated in part or whole with voice recognition software. Voice recognition is usually quite accurate but there are transcription errors that can and very often do occur. I apologize for any typographical errors that were not detected and corrected.     Earleen Newport, MD 08/16/19 [phone]

## 2019-08-16 NOTE — ED Notes (Signed)
This RN and Arby Barrette, RN at bedside. Pt with soiled brief. Pt cleaned. Clean and dry brief placed on pt.

## 2019-08-16 NOTE — ED Notes (Signed)
Pt transported to CT ?

## 2019-08-16 NOTE — ED Notes (Signed)
Pt resting with eyes closed at this time.

## 2019-08-16 NOTE — ED Triage Notes (Addendum)
Pt to ED via [facility] from niece's house. Per EMS pt is sepsis alert. Pt is dialysis pt and received dialysis w/ old fistula in right arm and access port in right chest. Per EMS c/o N/V/D. Pt recently seen on the 22nd at [facility] for same thing and discharged. Pt presents with swollen tongue. Pt recently received Hep B vaccine.    EMS VS: Temp 100.7 HR 113-117 CBG 309 Sats 97-100% on RA.  BP 209/104  Upon arrival pt unable to communicate verbally with staff with translator this time.

## 2019-08-17 ENCOUNTER — Inpatient Hospital Stay: Payer: Medicaid Other

## 2019-08-17 LAB — BASIC METABOLIC PANEL
Anion gap: 12 (ref 5–15)
BUN: 24 mg/dL — ABNORMAL HIGH (ref 8–23)
CO2: 29 mmol/L (ref 22–32)
Calcium: 8.1 mg/dL — ABNORMAL LOW (ref 8.9–10.3)
Chloride: 93 mmol/L — ABNORMAL LOW (ref 98–111)
Creatinine, Ser: 2.6 mg/dL — ABNORMAL HIGH (ref 0.44–1.00)
GFR calc Af Amer: 21 mL/min — ABNORMAL LOW (ref >60)
GFR calc non Af Amer: 18 mL/min — ABNORMAL LOW (ref >60)
Glucose, Bld: 260 mg/dL — ABNORMAL HIGH (ref 70–99)
Potassium: 3.9 mmol/L (ref 3.5–5.1)
Sodium: 134 mmol/L — ABNORMAL LOW (ref 135–145)

## 2019-08-17 LAB — MAGNESIUM: Magnesium: 2.3 mg/dL (ref 1.7–2.4)

## 2019-08-17 LAB — CBC
HCT: 31.8 % — ABNORMAL LOW (ref 36.0–46.0)
Hemoglobin: 10.2 g/dL — ABNORMAL LOW (ref 12.0–15.0)
MCH: 26.4 pg (ref 26.0–34.0)
MCHC: 32.1 g/dL (ref 30.0–36.0)
MCV: 82.2 fL (ref 80.0–100.0)
Platelets: 176 10*3/uL (ref 150–400)
RBC: 3.87 MIL/uL (ref 3.87–5.11)
RDW: 15.7 % — ABNORMAL HIGH (ref 11.5–15.5)
WBC: 6.6 10*3/uL (ref 4.0–10.5)
nRBC: 0 % (ref 0.0–0.2)

## 2019-08-17 LAB — GLUCOSE, CAPILLARY
Glucose-Capillary: 237 mg/dL — ABNORMAL HIGH (ref 70–99)
Glucose-Capillary: 191 mg/dL — ABNORMAL HIGH (ref 70–99)
Glucose-Capillary: 145 mg/dL — ABNORMAL HIGH (ref 70–99)
Glucose-Capillary: 151 mg/dL — ABNORMAL HIGH (ref 70–99)
Glucose-Capillary: 176 mg/dL — ABNORMAL HIGH (ref 70–99)

## 2019-08-17 LAB — HEMOGLOBIN A1C
Hgb A1c MFr Bld: 5.1 % (ref 4.8–5.6)
Mean Plasma Glucose: 99.67 mg/dL

## 2019-08-17 LAB — PHOSPHORUS: Phosphorus: 3.9 mg/dL (ref 2.5–4.6)

## 2019-08-17 LAB — T4, FREE: Free T4: 1.1 ng/dL (ref 0.61–1.12)

## 2019-08-17 LAB — FIBRIN DERIVATIVES D-DIMER (ARMC ONLY): Fibrin derivatives D-dimer (ARMC): 2449.65 ng/mL (FEU) — ABNORMAL HIGH (ref 0.00–499.00)

## 2019-08-17 LAB — AMMONIA: Ammonia: 16 umol/L (ref 9–35)

## 2019-08-17 LAB — TSH: TSH: 3.362 u[IU]/mL (ref 0.350–4.500)

## 2019-08-17 LAB — BRAIN NATRIURETIC PEPTIDE: B Natriuretic Peptide: 3273 pg/mL — ABNORMAL HIGH (ref 0.0–100.0)

## 2019-08-17 MED ORDER — INSULIN ASPART 100 UNIT/ML ~~LOC~~ SOLN
0.0000 [IU] | SUBCUTANEOUS | Status: DC
  Administered 2019-08-18 – 2019-08-22 (×6): 1 [IU] via SUBCUTANEOUS
  Administered 2019-08-22 (×3): 2 [IU] via SUBCUTANEOUS
  Administered 2019-08-22 – 2019-08-23 (×4): 1 [IU] via SUBCUTANEOUS
  Administered 2019-08-23: 2 [IU] via SUBCUTANEOUS
  Administered 2019-08-24 – 2019-08-25 (×8): 1 [IU] via SUBCUTANEOUS
  Administered 2019-08-25: 2 [IU] via SUBCUTANEOUS
  Administered 2019-08-25: 1 [IU] via SUBCUTANEOUS
  Administered 2019-08-26 (×2): 2 [IU] via SUBCUTANEOUS
  Administered 2019-08-27 (×2): 3 [IU] via SUBCUTANEOUS
  Administered 2019-08-27: 1 [IU] via SUBCUTANEOUS
  Administered 2019-08-28: 3 [IU] via SUBCUTANEOUS
  Administered 2019-08-28 – 2019-08-29 (×2): 2 [IU] via SUBCUTANEOUS
  Filled 2019-08-17 (×33): qty 1

## 2019-08-17 MED ORDER — HEPARIN SODIUM (PORCINE) 5000 UNIT/ML IJ SOLN
5000.0000 [IU] | Freq: Two times a day (BID) | INTRAMUSCULAR | Status: DC
  Administered 2019-08-17 – 2019-09-05 (×38): 5000 [IU] via SUBCUTANEOUS
  Filled 2019-08-17 (×36): qty 1

## 2019-08-17 MED ORDER — SODIUM CHLORIDE 0.9 % IV SOLN
1.0000 g | INTRAVENOUS | Status: DC
  Administered 2019-08-17 – 2019-08-19 (×3): 1 g via INTRAVENOUS
  Filled 2019-08-17 (×4): qty 1

## 2019-08-17 MED ORDER — ONDANSETRON HCL 4 MG PO TABS
4.0000 mg | ORAL_TABLET | Freq: Four times a day (QID) | ORAL | Status: DC | PRN
  Administered 2019-08-31: 4 mg via ORAL
  Filled 2019-08-17: qty 1

## 2019-08-17 MED ORDER — IPRATROPIUM-ALBUTEROL 0.5-2.5 (3) MG/3ML IN SOLN
3.0000 mL | RESPIRATORY_TRACT | Status: DC | PRN
  Administered 2019-09-01: 3 mL via RESPIRATORY_TRACT
  Filled 2019-08-17 (×2): qty 3

## 2019-08-17 MED ORDER — VANCOMYCIN HCL IN DEXTROSE 500-5 MG/100ML-% IV SOLN
500.0000 mg | INTRAVENOUS | Status: DC
  Administered 2019-08-19: 500 mg via INTRAVENOUS
  Filled 2019-08-17: qty 100

## 2019-08-17 MED ORDER — ACETAMINOPHEN 650 MG RE SUPP
650.0000 mg | Freq: Four times a day (QID) | RECTAL | Status: DC | PRN
  Administered 2019-09-01: 650 mg via RECTAL
  Filled 2019-08-17: qty 1

## 2019-08-17 MED ORDER — SODIUM CHLORIDE 0.9 % IV SOLN
2.0000 g | Freq: Once | INTRAVENOUS | Status: DC
Start: 2019-08-17 — End: 2019-08-17

## 2019-08-17 MED ORDER — FUROSEMIDE 40 MG PO TABS
80.0000 mg | ORAL_TABLET | ORAL | Status: DC
  Administered 2019-08-25 – 2019-09-04 (×6): 80 mg via ORAL
  Filled 2019-08-17: qty 4
  Filled 2019-08-17 (×4): qty 2
  Filled 2019-08-17: qty 4

## 2019-08-17 MED ORDER — MAGNESIUM HYDROXIDE 400 MG/5ML PO SUSP
30.0000 mL | Freq: Every day | ORAL | Status: DC | PRN
  Administered 2019-09-03: 30 mL via ORAL
  Filled 2019-08-17: qty 30

## 2019-08-17 MED ORDER — ACETAMINOPHEN 650 MG RE SUPP
650.0000 mg | Freq: Four times a day (QID) | RECTAL | Status: AC
  Administered 2019-08-17 (×4): 650 mg via RECTAL
  Filled 2019-08-17 (×4): qty 1

## 2019-08-17 MED ORDER — ONDANSETRON HCL 4 MG/2ML IJ SOLN
4.0000 mg | Freq: Four times a day (QID) | INTRAMUSCULAR | Status: DC | PRN
  Administered 2019-08-17 – 2019-09-03 (×5): 4 mg via INTRAVENOUS
  Filled 2019-08-17 (×5): qty 2

## 2019-08-17 MED ORDER — ACETAMINOPHEN 10 MG/ML IV SOLN: 500.0000 mg | Freq: Four times a day (QID) | INTRAVENOUS | Status: DC

## 2019-08-17 MED ORDER — VANCOMYCIN VARIABLE DOSE PER UNSTABLE RENAL FUNCTION (PHARMACIST DOSING): Status: DC

## 2019-08-17 MED ORDER — CHLORHEXIDINE GLUCONATE CLOTH 2 % EX PADS
6.0000 | MEDICATED_PAD | Freq: Every day | CUTANEOUS | Status: DC
  Administered 2019-08-18 – 2019-08-25 (×6): 6 via TOPICAL

## 2019-08-17 MED ORDER — SODIUM CHLORIDE 0.9 % IV SOLN: 2.0000 g | INTRAVENOUS | Status: DC

## 2019-08-17 MED ORDER — VANCOMYCIN HCL IN DEXTROSE 1-5 GM/200ML-% IV SOLN
1000.0000 mg | Freq: Once | INTRAVENOUS | Status: DC
Start: 2019-08-17 — End: 2019-08-17

## 2019-08-17 MED ORDER — ACETAMINOPHEN 325 MG PO TABS
650.0000 mg | ORAL_TABLET | Freq: Four times a day (QID) | ORAL | Status: DC | PRN
  Administered 2019-08-25 – 2019-09-04 (×5): 650 mg via ORAL
  Filled 2019-08-17 (×6): qty 2

## 2019-08-17 MED ORDER — LABETALOL HCL 5 MG/ML IV SOLN
20.0000 mg | INTRAVENOUS | Status: DC | PRN
  Administered 2019-08-17 – 2019-09-01 (×9): 20 mg via INTRAVENOUS
  Filled 2019-08-17 (×9): qty 4

## 2019-08-17 MED ORDER — AMLODIPINE BESYLATE 10 MG PO TABS
10.0000 mg | ORAL_TABLET | Freq: Every day | ORAL | Status: DC
  Administered 2019-08-20: 08:00:00 10 mg via ORAL
  Filled 2019-08-17: qty 1

## 2019-08-17 MED ORDER — SODIUM CHLORIDE 0.9 % IV SOLN: INTRAVENOUS | Status: DC

## 2019-08-17 MED ORDER — CALCIUM CARBONATE-VITAMIN D 500-200 MG-UNIT PO TABS: 1.0000 | ORAL_TABLET | Freq: Every day | ORAL | Status: DC

## 2019-08-17 MED ORDER — METRONIDAZOLE IN NACL 5-0.79 MG/ML-% IV SOLN
500.0000 mg | Freq: Three times a day (TID) | INTRAVENOUS | Status: DC
  Administered 2019-08-17 – 2019-08-19 (×8): 500 mg via INTRAVENOUS
  Filled 2019-08-17 (×12): qty 100

## 2019-08-17 NOTE — Progress Notes (Signed)
Pharmacy Antibiotic Note  Marlene Lard Ninel Abdella is a 69 y.o. female admitted on 08/16/2019 with sepsis w/ possible encephalopathy w/ slightly elevated ammonia and LA of 2.0, patient is spanish-speaking originally from [place] and has ran out of her bp meds and has been taking [place] off-brand meds. Currently in hypertensive crisis w/ h/o HD last known session 04/22 (TThSa). Pharmacy has been consulted for vanc/cefepime/flagyl dosing.  Plan: Patient has a low body weight from ideal and received vanc 1g IV load, zosyn 3.375g IV x 1 in ED  Will place vancomycin pharmacy dosing for unstable renal function place holder, until nephrology can see patient and set up for dialysis. Will start cefepime 1g IV daily in preparation of HD. Will continue flagyl 500 mg IV q8h and will continue to monitor and adjust doses per anticipated dialysis schedule.  Height: 4\' 11"  (149.9 cm) Weight: 31.7 kg (69 lb 14.2 oz) IBW/kg (Calculated) : 43.2  Temp (24hrs), Avg:102.7 F (39.3 C), Min:101.8 F (38.8 C), Max:103.9 F (39.9 C)  Recent Labs  Lab 08/14/19 1831 08/16/19 2029 08/16/19 2255  WBC 5.2 6.4  --   CREATININE 1.71* 1.88*  --   LATICACIDVEN  --  2.0* 1.7    Estimated Creatinine Clearance: 13.9 mL/min (A) (by C-G formula based on SCr of 1.88 mg/dL (H)).    No Known Allergies  Thank you for allowing pharmacy to be a part of this patient's care.  Tobie Lords, PharmD, BCPS Clinical Pharmacist 08/17/2019 2:46 AM

## 2019-08-17 NOTE — ED Notes (Signed)
MD Mansy aware of pt's continued fever at this time. This RN will await further orders.

## 2019-08-17 NOTE — Progress Notes (Signed)
Pharmacy Antibiotic Note  Amber Dixon is a 69 y.o. female admitted on 08/16/2019 with sepsis w/ possible encephalopathy w/ slightly elevated ammonia and LA of 2.0, patient is spanish-speaking originally from [place] and has ran out of her bp meds and has been taking [place] off-brand meds. Currently in hypertensive crisis w/ h/o HD last known session 04/22 (TThSa). Pharmacy has been consulted for vanc/cefepime/flagyl dosing.  Plan: Patient has a low body weight from ideal and received vanc 1g IV load, zosyn 3.375g IV x 1 in ED  Will place vancomycin pharmacy dosing for unstable renal function place holder, until nephrology can see patient and set up for dialysis. Will start cefepime 1g IV daily in preparation of HD. Will continue flagyl 500 mg IV q8h and will continue to monitor and adjust doses per anticipated dialysis schedule.  - on HD T,Th,Sa per Nephrology. Will order Vancomycin 500mg  IV w/HD T,Th,Sa. F/u each day and assess if needs HD.   Height: 4\' 11"  (149.9 cm) Weight: 33.6 kg (74 lb 1.2 oz) IBW/kg (Calculated) : 43.2  Temp (24hrs), Avg:101.9 F (38.8 C), Min:100.2 F (37.9 C), Max:103.9 F (39.9 C)  Recent Labs  Lab 08/14/19 1831 08/16/19 2029 08/16/19 2255 08/17/19 0243  WBC 5.2 6.4  --  6.6  CREATININE 1.71* 1.88*  --  2.60*  LATICACIDVEN  --  2.0* 1.7  --     Estimated Creatinine Clearance: 10.7 mL/min (A) (by C-G formula based on SCr of 2.6 mg/dL (H)).    No Known Allergies  Thank you for allowing pharmacy to be a part of this patient's care.  Chinita Greenland PharmD Clinical Pharmacist 08/17/2019

## 2019-08-17 NOTE — H&P (Addendum)
[place] [facility] [place] NAME: Amber Dixon    MR#:  [id number]  DATE OF BIRTH:  January 17, 1951  DATE OF ADMISSION:  08/16/2019  PRIMARY CARE PHYSICIAN: Patient, No Pcp Per   REQUESTING/REFERRING PHYSICIAN: Lenise Arena, MD CHIEF COMPLAINT:   Chief Complaint  Patient presents with  . Blood Infection  . Altered Mental Status    HISTORY OF PRESENT ILLNESS:  Amber Dixon  is a 69 y.o. Hispanic female with a known history of type 2 diabetes mellitus, hypertension and chronic kidney disease, who presented to the emergency room with acute onset of altered mental status with confusion and decreased responsiveness with associated elevated blood pressure today.  The patient was seen in [facility] on 4/22 for elevated blood pressure and similar confusion after hemodialysis which has resolved with improved blood pressure.  Today according to her son she could not speak.  The patient is only Spanish-speaking and communication was made through translator with her son.  She has been having headache and dizziness.  No paresthesias or focal muscle weakness.  No witnessed seizures.  She developed fever and chills today.  No reported dysuria, oliguria or hematuria or flank pain.  No chest pain or dyspnea or palpitations.  No cough or wheezing or hemoptysis. EKG showed sinus tachycardia with a rate of 116 with Q waves anteriorly.  Hispanic  Upon presentation to the emergency room, blood pressure was 216/111 with temperature of 102.2, heart rate of 120 and respiratory rate of 22.  Labs revealed creatinine of 1.88 and blood glucose 224 with sodium 134 and AST 48 but otherwise unremarkable CMP.  Lactic acid was 2 and later 1.7 and CBC was unremarkable.  COVID-19 PCR and influenza antigens came back negative.  Urinalysis showed more than 300 protein and 250 glucose.  Chest x-ray showed stable cardiomegaly without evidence of acute or active  cardiopulmonary disease.  The patient was given 650 mg p.o. Tylenol and 50 mg of IV Benadryl, labetalol 10 mg, IV vancomycin and Zosyn as well as 125 mg of IV Solu-Medrol.  She will be admitted to a progressive cardiac unit bed for further evaluation and management.  PAST MEDICAL HISTORY:   Past Medical History:  Diagnosis Date  . Diabetes mellitus without complication ([place])   . Hypertension   . Renal disorder     PAST SURGICAL HISTORY:   Past Surgical History:  Procedure Laterality Date  . AV FISTULA PLACEMENT Right 04/24/2019   Procedure: ARTERIOVENOUS (AV) FISTULA CREATION RIGHT ARM;  Surgeon: Waynetta Sandy, MD;  Location: [place];  Service: Vascular;  Laterality: Right;  . AV FISTULA PLACEMENT Right   . HIP FRACTURE SURGERY Right    in [place]  . IR FLUORO GUIDE CV LINE RIGHT  04/02/2019  . IR FLUORO GUIDE CV LINE RIGHT  04/13/2019  . IR US GUIDE VASC ACCESS RIGHT  04/02/2019  . LEG SURGERY      SOCIAL HISTORY:   Social History   Tobacco Use  . Smoking status: Never Smoker  . Smokeless tobacco: Never Used  Substance Use Topics  . Alcohol use: Not on file    FAMILY HISTORY:  No family history on file.  No reported familial diseases.  DRUG ALLERGIES:  No Known Allergies  REVIEW OF SYSTEMS:   ROS As per history of present illness. All pertinent systems were reviewed above. Constitutional,  HEENT, cardiovascular, respiratory, GI, GU, musculoskeletal, neuro, psychiatric, endocrine,  integumentary and  hematologic systems were reviewed and are otherwise  negative/unremarkable except for positive findings mentioned above in the HPI.   MEDICATIONS AT HOME:   Prior to Admission medications   Medication Sig Start Date End Date Taking? Authorizing Provider  amLODipine (NORVASC) 10 MG tablet Take 1 tablet (10 mg total) by mouth daily. 06/12/19  Yes Lorella Nimrod, MD  PRESCRIPTION MEDICATION Inject 5 Units into the skin daily. Insulin from [place] [facility]).  TAKES ON DAYS AFTER DIALYSIS   Yes [provider]  calcium carbonate (TUMS) 500 MG chewable tablet Chew 1 tablet (200 mg of elemental calcium total) by mouth 3 (three) times daily. Patient not taking: Reported on 08/17/2019 03/25/19 03/24/20  Al Decant, MD  calcium-vitamin D (OSCAL WITH D) 500-200 MG-UNIT tablet Take 1 tablet by mouth daily with breakfast.    [provider]  furosemide (LASIX) 80 MG tablet Take 1 tablet (80 mg total) by mouth 2 (two) times daily. Patient taking differently: Take 80 mg by mouth every other day.  03/25/19 04/24/19  Al Decant, MD  ondansetron (ZOFRAN ODT) 4 MG disintegrating tablet Take 1 tablet (4 mg total) by mouth every 8 (eight) hours as needed for nausea or vomiting. Patient not taking: Reported on 08/17/2019 04/25/19   Jean Rosenthal, MD  PRESCRIPTION MEDICATION Inject 6 Units into the skin daily. Medication:Insulin Vial    [provider]      VITAL SIGNS:  Blood pressure (!) 190/84, pulse (!) 113, temperature (!) 103 F (39.4 C), temperature source Oral, resp. rate 16, height 4\' 11"  (1.499 m), weight 31.7 kg, SpO2 98 %.  PHYSICAL EXAMINATION:  Physical Exam  GENERAL:  69 y.o.-year-old Hispanic female patient lying in the bed with no acute distress.  She was somnolent but arousable. EYES: Pupils equal, round, reactive to light and accommodation. No scleral icterus. Extraocular muscles intact.  HEENT: Head atraumatic, normocephalic. Oropharynx and nasopharynx clear.  NECK:  Supple, no jugular venous distention. No thyroid enlargement, no tenderness.  LUNGS: Normal breath sounds bilaterally, no wheezing, rales,rhonchi or crepitation. No use of accessory muscles of respiration.  CARDIOVASCULAR: Regular rate and rhythm, S1, S2 normal. No murmurs, rubs, or gallops.  ABDOMEN: Soft, nondistended, nontender. Bowel sounds present. No organomegaly or mass.  EXTREMITIES: No pedal edema, cyanosis, or clubbing.  NEUROLOGIC: She is  moving all 4 extremities.  She readily follows commands being globally confused.  No clear lateralizing signs.  Gait could not be tested. PSYCHIATRIC: The patient is somnolent but arousable.  She is globally confused. SKIN: No obvious rash, lesion, or ulcer.   LABORATORY PANEL:   CBC Recent Labs  Lab 08/16/19 2029  WBC 6.4  HGB 12.1  HCT 38.0  PLT 215   ------------------------------------------------------------------------------------------------------------------  Chemistries  Recent Labs  Lab 08/16/19 2029  NA 134*  K 3.8  CL 92*  CO2 32  GLUCOSE 224*  BUN 14  CREATININE 1.88*  CALCIUM 8.6*  AST 48*  ALT 31  ALKPHOS 155*  BILITOT 0.4   ------------------------------------------------------------------------------------------------------------------  Cardiac Enzymes No results for input(s): TROPONINI in the last 168 hours. ------------------------------------------------------------------------------------------------------------------  RADIOLOGY:  CT Head Wo Contrast  Result Date: 08/16/2019 CLINICAL DATA:  Sepsis, nausea, vomiting, diarrhea, dialysis dependent EXAM: CT HEAD WITHOUT CONTRAST TECHNIQUE: Contiguous axial images were obtained from the base of the skull through the vertex without intravenous contrast. COMPARISON:  01/15/2018 FINDINGS: Brain: No acute infarct or hemorrhage. Chronic small vessel ischemic changes are seen within the bilateral basal ganglia and periventricular white matter. The  lateral ventricles and remaining midline structures are unremarkable. No acute extra-axial fluid collections. No mass effect. Vascular: No hyperdense vessel or unexpected calcification. Skull: Normal. Negative for fracture or focal lesion. Sinuses/Orbits: Minimal secretions within the right sphenoid sinus. Remaining sinuses are clear. Other: None. IMPRESSION: 1. Stable chronic small vessel ischemic changes.  No acute process. Electronically Signed   By: Randa Ngo  M.D.   On: 08/16/2019 21:53   DG Chest Port 1 View  Result Date: 08/16/2019 CLINICAL DATA:  Fever. EXAM: PORTABLE CHEST 1 VIEW COMPARISON:  August 14, 2019 FINDINGS: There is stable right-sided venous catheter positioning. Mild, chronic appearing increased lung markings are seen without evidence of acute infiltrate, pleural effusion or pneumothorax. There is stable moderate to marked severity enlargement of the cardiac silhouette. The visualized skeletal structures are unremarkable. IMPRESSION: Stable cardiomegaly without evidence of acute or active cardiopulmonary disease. Electronically Signed   By: Virgina Norfolk M.D.   On: 08/16/2019 20:50      IMPRESSION AND PLAN:   1.  Acute encephalopathy. -The patient will be admitted to a progressive cardiac bed. -Differential diagnoses would include hypertensive encephalopathy, frontal CVA/TIA and encephalitis.  She was not hypoglycemic.  She had no ammonia level yet. -We will obtain ammonia level. -We will follow neuro checks every 4 hours for 24 hours. -We will obtain a brain MRI without contrast in a.m. to rule out frontal CVA. -With improvement of mental status, PT/OT consult can be obtained.  Speech consult will be obtained. -Neurology consult will be obtained.  I notified Dr. Irish Elders.  2.  Sepsis of questionable etiology without severe sepsis or septic shock. -We will continue broad-spectrum antibiotics with IV vancomycin, cefepime and Flagyl. -We will follow blood cultures. -LP can be considered by IR in a.m. due to difficulty obtaining it with her fetal position.  3.  Hypertensive urgency. -The patient will be placed on as needed IV labetalol. -We will continue her antihypertensives.  4.  End-stage renal disease on hemodialysis. -Nephrology consultation will be obtained for follow-up.  5.  DVT prophylaxis -Subcutaneous heparin.   All the records are reviewed and case discussed with ED provider. The plan of care was discussed  in details with the patient (and family). I answered all questions. The patient agreed to proceed with the above mentioned plan. Further management will depend upon hospital course.   CODE STATUS: Full code  Status is: Inpatient  Remains inpatient appropriate because:Hemodynamically unstable, Altered mental status, Ongoing diagnostic testing needed not appropriate for outpatient work up, Unsafe d/c plan, IV treatments appropriate due to intensity of illness or inability to take PO and Inpatient level of care appropriate due to severity of illness   Dispo: The patient is from: Home              Anticipated d/c is to: Home              Anticipated d/c date is: 2 days              Patient currently is not medically stable to d/c.    TOTAL TIME TAKING CARE OF THIS PATIENT: 60 minutes.    Christel Mormon M.D on 08/17/2019 at 1:51 AM  [facility]   From 7 PM-7 AM, contact night-coverage [email or web address]  CC: Primary care physician; Patient, No Pcp Per   Note: This dictation was prepared with Dragon dictation along with smaller phrase technology. Any transcriptional errors that result from this process are unintentional.

## 2019-08-17 NOTE — ED Notes (Signed)
MD Mansy aware that pt is unable to complete MRI at this time.

## 2019-08-17 NOTE — Consult Note (Signed)
Halena Mohar MRN: [id number] DOB/AGE: 71-21-1952 71 y.o. Primary Care Physician:Patient, No Pcp Per Admit date: 08/16/2019 Chief Complaint:  Chief Complaint  Patient presents with  . Blood Infection  . Altered Mental Status   History is from medical records and the medical staff.  Patient herself is confused and unable to give any history  HPI: Patient is a 69 year old Hispanic female with a past medical history of diabetes mellitus type 2, hypertension, ESRD who was brought to the ER with chief complaint of altered mental status.  History of present illness dates back to April 22 when patient was brought to the ER for altered mental status and uncontrolled hypertension.  Patient was evaluated in the ER and was discharged back home.  Patient had similar issue yesterday on April 24.  On plan for evaluation in the ER patient was found to have uncontrolled hypertension with blood pressure of 216/11 and patient was also febrile with a temperature of 102.2, tachycardic and tachypneic.  Patient chest x-ray and CT head were negative for any acute changes. Patient was admitted under septic protocol.  Patient was seen today in ICU.  Patient is agitated and unable to offer any new complaints     Past Medical History:  Diagnosis Date  . Diabetes mellitus without complication ([place])   . Hypertension   . Renal disorder         No family history on file. No family history of ESRD   Social History:  reports that she has never smoked. She has never used smokeless tobacco. She reports previous drug use. No history on file for alcohol.   Allergies: No Known Allergies  Medications Prior to Admission  Medication Sig Dispense Refill  . amLODipine (NORVASC) 10 MG tablet Take 1 tablet (10 mg total) by mouth daily. 30 tablet 0  . PRESCRIPTION MEDICATION Inject 5 Units into the skin daily. Insulin from [place] [facility]). TAKES ON DAYS AFTER DIALYSIS    . calcium carbonate (TUMS)  500 MG chewable tablet Chew 1 tablet (200 mg of elemental calcium total) by mouth 3 (three) times daily. (Patient not taking: Reported on 08/17/2019) 90 tablet 3  . calcium-vitamin D (OSCAL WITH D) 500-200 MG-UNIT tablet Take 1 tablet by mouth daily with breakfast.    . furosemide (LASIX) 80 MG tablet Take 1 tablet (80 mg total) by mouth 2 (two) times daily. (Patient taking differently: Take 80 mg by mouth every other day. ) 60 tablet 3  . ondansetron (ZOFRAN ODT) 4 MG disintegrating tablet Take 1 tablet (4 mg total) by mouth every 8 (eight) hours as needed for nausea or vomiting. (Patient not taking: Reported on 08/17/2019) 20 tablet 0  . PRESCRIPTION MEDICATION Inject 6 Units into the skin daily. Medication:Insulin Vial         YYT:KPTWS from the symptoms mentioned above,there are no other symptoms referable to all systems reviewed.  Marland Kitchen acetaminophen  650 mg Rectal Q6H  . amLODipine  10 mg Oral Daily  . Chlorhexidine Gluconate Cloth  6 each Topical Daily  . furosemide  80 mg Oral QODAY  . heparin  5,000 Units Subcutaneous Q12H  . insulin aspart  0-6 Units Subcutaneous Q4H  . [START ON 08/19/2019] vancomycin  500 mg Intravenous Q T,Th,Sa-HD       Physical Exam: Vital signs in last 24 hours: Temp:  [100.2 F (37.9 C)-103.9 F (39.9 C)] 100.2 F (37.9 C) (04/25 1700) Pulse Rate:  [94-139] 95 (04/25 1700) Resp:  [16-33] 27 (04/25  1700) BP: (157-216)/(66-135) 169/74 (04/25 1700) SpO2:  [92 %-100 %] 100 % (04/25 1700) Weight:  [31.7 kg-33.6 kg] 33.6 kg (04/25 0624) Weight change:  Last BM Date: 08/17/19  Intake/Output from previous day: No intake/output data recorded. Total I/O In: 902.1 [I.V.:583.9; IV Piggyback:318.3] Out: 0    Physical Exam: General- pt is awake, but not oriented to time place and person Resp- No acute REsp distress,  Rhonchi present CVS- S1S2 regular in rate and rhythm GIT- BS+, soft, NT, ND EXT- NO LE Edema, Cyanosis CNS- CN 2-12 grossly intact. Moving  all 4 extremities Psych-unable to assess  Access- PC and AV fistula   Lab Results: CBC Recent Labs    08/16/19 2029 08/17/19 0243  WBC 6.4 6.6  HGB 12.1 10.2*  HCT 38.0 31.8*  PLT 215 176    BMET Recent Labs    08/16/19 2029 08/17/19 0243  NA 134* 134*  K 3.8 3.9  CL 92* 93*  CO2 32 29  GLUCOSE 224* 260*  BUN 14 24*  CREATININE 1.88* 2.60*  CALCIUM 8.6* 8.1*    MICRO Recent Results (from the past 240 hour(s))  Blood Culture (routine x 2)     Status: None (Preliminary result)   Collection Time: 08/16/19  8:29 PM   Specimen: BLOOD  Result Value Ref Range Status   Specimen Description BLOOD BLOOD LEFT FOREARM  Final   Special Requests   Final    BOTTLES DRAWN AEROBIC AND ANAEROBIC Blood Culture adequate volume   Culture   Final    NO GROWTH < 12 HOURS Performed at [facility], [address], [place], [place] [address]    Report Status PENDING  Incomplete  Blood Culture (routine x 2)     Status: None (Preliminary result)   Collection Time: 08/16/19  8:29 PM   Specimen: BLOOD  Result Value Ref Range Status   Specimen Description BLOOD BLOOD LEFT ARM  Final   Special Requests   Final    BOTTLES DRAWN AEROBIC AND ANAEROBIC Blood Culture results may not be optimal due to an inadequate volume of blood received in culture bottles   Culture   Final    NO GROWTH < 12 HOURS Performed at [facility], [address], [place], [place] [address]    Report Status PENDING  Incomplete  Respiratory Panel by RT PCR (Flu A&B, Covid) - Urine, Clean Catch     Status: None   Collection Time: 08/16/19  8:47 PM   Specimen: Urine, Clean Catch  Result Value Ref Range Status   SARS Coronavirus 2 by RT PCR NEGATIVE NEGATIVE Final    Comment: (NOTE) SARS-CoV-2 target nucleic acids are NOT DETECTED. The SARS-CoV-2 RNA is generally detectable in upper respiratoy specimens during the acute phase of infection. The lowest concentration of SARS-CoV-2 viral copies  this assay can detect is 131 copies/mL. A negative result does not preclude SARS-Cov-2 infection and should not be used as the sole basis for treatment or other patient management decisions. A negative result may occur with  improper specimen collection/handling, submission of specimen other than nasopharyngeal swab, presence of viral mutation(s) within the areas targeted by this assay, and inadequate number of viral copies (<131 copies/mL). A negative result must be combined with clinical observations, patient history, and epidemiological information. The expected result is Negative. Fact Sheet for Patients:  PinkCheek.be Fact Sheet for Healthcare Providers:  GravelBags.it This test is not yet ap proved or cleared by the [place] and  has been authorized for detection and/or diagnosis of SARS-CoV-2 by FDA under an Emergency Use Authorization (EUA). This EUA will remain  in effect (meaning this test can be used) for the duration of the COVID-19 declaration under Section 564(b)(1) of the Act, 21 U.S.C. section 360bbb-3(b)(1), unless the authorization is terminated or revoked sooner.    Influenza A by PCR NEGATIVE NEGATIVE Final   Influenza B by PCR NEGATIVE NEGATIVE Final    Comment: (NOTE) The Xpert Xpress SARS-CoV-2/FLU/RSV assay is intended as an aid in  the diagnosis of influenza from Nasopharyngeal swab specimens and  should not be used as a sole basis for treatment. Nasal washings and  aspirates are unacceptable for Xpert Xpress SARS-CoV-2/FLU/RSV  testing. Fact Sheet for Patients: PinkCheek.be Fact Sheet for Healthcare Providers: GravelBags.it This test is not yet approved or cleared by the [place] FDA and  has been authorized for detection and/or diagnosis of SARS-CoV-2 by  FDA under an Emergency Use Authorization (EUA). This EUA will remain  in  effect (meaning this test can be used) for the duration of the  Covid-19 declaration under Section 564(b)(1) of the Act, 21  U.S.C. section 360bbb-3(b)(1), unless the authorization is  terminated or revoked. Performed at [facility], [address] [place], [place] [address]       Lab Results  Component Value Date   PTH 269 (H) 03/22/2019   PTH Comment 03/22/2019   CALCIUM 8.1 (L) 08/17/2019   CAION 0.90 (L) 06/09/2019   PHOS 3.9 08/17/2019      Impression:   Patient is a 69 year old Hispanic female with a past medical history of ESRD-on hemodialysis Tuesday Thursday Saturday schedule, diabetes mellitus, hypertension, anemia of chronic disease, second hyperparathyroidism who was brought to the hospital with chief complaint of high fever and altered mental status. Patient was admitted with sepsis, acute encephalopathy, hypertensive emergency.   1)Renal  ESRD on HD Patient is on Tuesday/Thursday/Saturday schedule  Patient was last dialyzed yesterday NO need for HD today    2)HTN BP now better. Patient edema present patient had SBP more than 200. Patient SBP is now less than 170    3)Anemia of chronic disease  HGb at goal (9--11)   4) Secondary Hyperparathyroidism CKD Mineral-Bone Disorder  Secondary Hyperparathyroidism  Present. Phosphorus at goal.   5) sepsis Patient blood cultures 1/2 is positive for gram-positive cocci Patient is admitted with septic protocol Patient is on broad-spectrum antibiotics Primary team is following closely  6)Electrolytes  Normokalemic  Hyponatremic Secondary to ESRD-inablity to get rid of free water  7)Acid base Co2 at goal  8) AMS Patient admitted with altered mental status Primary team and neurology following   Plan:  No acute need for renal replacement therapy today. Please adjust the medication according to-GFR/renal function.    Zion Ta s Theador Hawthorne 08/17/2019, 5:31 PM

## 2019-08-17 NOTE — Progress Notes (Signed)
CODE SEPSIS - PHARMACY COMMUNICATION  **Broad Spectrum Antibiotics should be administered within 1 hour of Sepsis diagnosis**  Time Code Sepsis Called/Page Received: 0032  Antibiotics Ordered: vanc/zosyn  Time of 1st antibiotic administration: 2044  Additional action taken by pharmacy:   If necessary, Name of Provider/Nurse Contacted:     Tobie Lords ,PharmD Clinical Pharmacist  08/17/2019  12:42 AM

## 2019-08-17 NOTE — Progress Notes (Signed)
PT Cancellation Note  Patient Details Name: Amber Dixon MRN: [id number] DOB: Sep 10, 1950   Cancelled Treatment:    Reason Eval/Treat Not Completed: Medical issues which prohibited therapy;Other (comment).  Pt was unable to be seen for PT eval, but is getting medically managed per nsg to be more stable for therapy, but will hold for now.  Retry tomorrow.   Ramond Dial 08/17/2019, 1:12 PM   Mee Hives, PT MS Acute Rehab Dept. Number: [place]

## 2019-08-17 NOTE — Progress Notes (Signed)
PROGRESS NOTE    Amber Dixon  NMM:[id number] DOB: [birth year] DOA: 08/16/2019 PCP: Patient, No Pcp Per (Confirm with patient/family/NH records and if not entered, this HAS to be entered at [facility] point of entry. "No PCP" if truly none.)   Brief Narrative: (Start on day 1 of progress note - keep it brief and live) Patient is a 69 year old female with history of type 2 diabetes, hypertension, end-stage renal disease on dialysis who came to the hospital with high fever, altered mental status and elevated blood pressure. Spoke with daughter-in-law, patient starting to feel sick before dialysis yesterday.  She started to have confusion, feel hot. At arriving in the hospital, she had a significant elevation in blood pressure, temperature 102.2, patient become very confused, minimally responsive.  She had a CT head did not show any acute changes.  Neurology consult was also obtained.  She was also put on broad-spectrum antibiotics with vancomycin, cefepime and Flagyl.  Initial blood culture had a 1 bottle positive for gram-positive cocci.   Assessment & Plan:   Active Problems:   Encephalopathy   Sepsis ([place])  #1.  Sepsis. Etiology still unclear.  Given patient hemodialysis, possibility of septicemia secondary to line infection.  Blood culture already positive in 1 bottle with gram-positive cocci.  I will continue current coverage with vancomycin, cefepime and Flagyl.  We will follow up with the final results of culture.  #2.  Acute encephalopathy. Most likely secondary to sepsis, and hypertension emergency.  Initial CT scan did not show any acute changes.  Has not been able to perform MRI of the brain due to agitation.  Neurology will see the patient today.  Continue monitor.  Patient will not be able to take any p.o., will continue maintenance fluids at of 50 mL/hour.  #3.  Hypertension emergency. Blood pressures are better after giving IV labetalol.   #4.  End-stage renal disease  on hemodialysis. Nephrology will see patient today.  #5.  Type 2 diabetes. Continue some sliding scale insulin.  #6.  Hyponatremia.  Follow.   DVT prophylaxis: Heparin Code Status: Discussed with patient daughter, patient was full code. Family Communication: Discussed with the family, all questions answered. Disposition Plan:  . Patient came from:Home            . Anticipated d/c place:Home . Barriers to d/c OR conditions which need to be met to effect a safe d/c:   Consultants:   Neurology  Nephrology  Procedures: None Antimicrobials:  Vancomycin Cefepime Flagyl  Subjective: Try to see the patient in the presence of a Spanish interpreter, patient on opening eyes, did not answer any questions. Spoke with the nurse, patient has been sleeping.  Does not seem to have any short of breath.  She does not have any nausea vomiting or diarrhea.  Objective: Vitals:   08/17/19 0800 08/17/19 0900 08/17/19 1000 08/17/19 1100  BP: (!) 172/73 (!) 161/123 (!) 179/87 (!) 184/76  Pulse: (!) 103 (!) 106 (!) 102 (!) 102  Resp: 19 17 (!) 21 (!) 23  Temp: (!) 102.7 F (39.3 C) (!) 101.5 F (38.6 C) (!) 100.8 F (38.2 C) (!) 101.3 F (38.5 C)  TempSrc: Rectal Rectal Rectal Rectal  SpO2: 97% 97% 92% 99%  Weight:      Height:        Intake/Output Summary (Last 24 hours) at 08/17/2019 1201 Last data filed at 08/17/2019 1131 Gross per 24 hour  Intake 902.11 ml  Output 0 ml  Net 902.[place]  ml   Filed Weights   08/16/19 2023 08/17/19 0624  Weight: 31.7 kg 33.6 kg    Examination:  General exam: Chronically ill, drowsy and arousable. Respiratory system: Clear to auscultation. Respiratory effort normal. Cardiovascular system: S1 & S2 heard, tachycardic. No JVD, murmurs, rubs, gallops or clicks. No pedal edema. Gastrointestinal system: Abdomen is nondistended, soft and nontender. No organomegaly or masses felt. Normal bowel sounds heard. Central nervous system: Drowsy, opens eyes only  to verbal commands. Extremities: Symmetric  Skin: No rashes, lesions or ulcers Psychiatry: Not able to assess.     Data Reviewed: I have personally reviewed following labs and imaging studies  CBC: Recent Labs  Lab 08/14/19 1831 08/16/19 2029 08/17/19 0243  WBC 5.2 6.4 6.6  NEUTROABS 4.1 5.1  --   HGB 12.3 12.1 10.2*  HCT 40.0 38.0 31.8*  MCV 85.8 82.4 82.2  PLT 168 215 448   Basic Metabolic Panel: Recent Labs  Lab 08/14/19 1831 08/16/19 2029 08/17/19 0243 08/17/19 0656  NA 130* 134* 134*  --   K 3.5 3.8 3.9  --   CL 92* 92* 93*  --   CO2 26 32 29  --   GLUCOSE 236* 224* 260*  --   BUN 9 14 24*  --   CREATININE 1.71* 1.88* 2.60*  --   CALCIUM 7.9* 8.6* 8.1*  --   MG  --   --   --  2.3  PHOS  --   --   --  3.9   GFR: Estimated Creatinine Clearance: 10.7 mL/min (A) (by C-G formula based on SCr of 2.6 mg/dL (H)). Liver Function Tests: Recent Labs  Lab 08/14/19 1831 08/16/19 2029  AST 39 48*  ALT 28 31  ALKPHOS 161* 155*  BILITOT 0.5 0.4  PROT 6.9 7.8  ALBUMIN 3.5 4.2   No results for input(s): LIPASE, AMYLASE in the last 168 hours. Recent Labs  Lab 08/14/19 1831 08/17/19 0243  AMMONIA 42* 16   Coagulation Profile: Recent Labs  Lab 08/16/19 2029  INR 0.9   Cardiac Enzymes: No results for input(s): CKTOTAL, CKMB, CKMBINDEX, TROPONINI in the last 168 hours. BNP (last 3 results) No results for input(s): PROBNP in the last 8760 hours. HbA1C: No results for input(s): HGBA1C in the last 72 hours. CBG: Recent Labs  Lab 08/14/19 1735 08/17/19 0622  GLUCAP 224* 237*   Lipid Profile: No results for input(s): CHOL, HDL, LDLCALC, TRIG, CHOLHDL, LDLDIRECT in the last 72 hours. Thyroid Function Tests: Recent Labs    08/17/19 0656  TSH 3.362  FREET4 1.10   Anemia Panel: No results for input(s): VITAMINB12, FOLATE, FERRITIN, TIBC, IRON, RETICCTPCT in the last 72 hours. Sepsis Labs: Recent Labs  Lab 08/16/19 2029 08/16/19 2255  LATICACIDVEN  2.0* 1.7    Recent Results (from the past 240 hour(s))  Blood Culture (routine x 2)     Status: None (Preliminary result)   Collection Time: 08/16/19  8:29 PM   Specimen: BLOOD  Result Value Ref Range Status   Specimen Description BLOOD BLOOD LEFT FOREARM  Final   Special Requests   Final    BOTTLES DRAWN AEROBIC AND ANAEROBIC Blood Culture adequate volume   Culture   Final    NO GROWTH < 12 HOURS Performed at [facility], [address] [place], [place] [address]    Report Status PENDING  Incomplete  Blood Culture (routine x 2)     Status: None (Preliminary result)   Collection Time: 08/16/19  8:29 PM   Specimen: BLOOD  Result Value Ref Range Status   Specimen Description BLOOD BLOOD LEFT ARM  Final   Special Requests   Final    BOTTLES DRAWN AEROBIC AND ANAEROBIC Blood Culture results may not be optimal due to an inadequate volume of blood received in culture bottles   Culture   Final    NO GROWTH < 12 HOURS Performed at [facility], [address] [place], [place] [address]    Report Status PENDING  Incomplete  Respiratory Panel by RT PCR (Flu A&B, Covid) - Urine, Clean Catch     Status: None   Collection Time: 08/16/19  8:47 PM   Specimen: Urine, Clean Catch  Result Value Ref Range Status   SARS Coronavirus 2 by RT PCR NEGATIVE NEGATIVE Final    Comment: (NOTE) SARS-CoV-2 target nucleic acids are NOT DETECTED. The SARS-CoV-2 RNA is generally detectable in upper respiratoy specimens during the acute phase of infection. The lowest concentration of SARS-CoV-2 viral copies this assay can detect is 131 copies/mL. A negative result does not preclude SARS-Cov-2 infection and should not be used as the sole basis for treatment or other patient management decisions. A negative result may occur with  improper specimen collection/handling, submission of specimen other than nasopharyngeal swab, presence of viral mutation(s) within the areas targeted by this  assay, and inadequate number of viral copies (<131 copies/mL). A negative result must be combined with clinical observations, patient history, and epidemiological information. The expected result is Negative. Fact Sheet for Patients:  PinkCheek.be Fact Sheet for Healthcare Providers:  GravelBags.it This test is not yet ap proved or cleared by the [place] FDA and  has been authorized for detection and/or diagnosis of SARS-CoV-2 by FDA under an Emergency Use Authorization (EUA). This EUA will remain  in effect (meaning this test can be used) for the duration of the COVID-19 declaration under Section 564(b)(1) of the Act, 21 U.S.C. section 360bbb-3(b)(1), unless the authorization is terminated or revoked sooner.    Influenza A by PCR NEGATIVE NEGATIVE Final   Influenza B by PCR NEGATIVE NEGATIVE Final    Comment: (NOTE) The Xpert Xpress SARS-CoV-2/FLU/RSV assay is intended as an aid in  the diagnosis of influenza from Nasopharyngeal swab specimens and  should not be used as a sole basis for treatment. Nasal washings and  aspirates are unacceptable for Xpert Xpress SARS-CoV-2/FLU/RSV  testing. Fact Sheet for Patients: PinkCheek.be Fact Sheet for Healthcare Providers: GravelBags.it This test is not yet approved or cleared by the [place] FDA and  has been authorized for detection and/or diagnosis of SARS-CoV-2 by  FDA under an Emergency Use Authorization (EUA). This EUA will remain  in effect (meaning this test can be used) for the duration of the  Covid-19 declaration under Section 564(b)(1) of the Act, 21  U.S.C. section 360bbb-3(b)(1), unless the authorization is  terminated or revoked. Performed at [facility], [address] [place], [place] [address]          Radiology Studies: CT Head Wo Contrast  Result Date: 08/16/2019 CLINICAL  DATA:  Sepsis, nausea, vomiting, diarrhea, dialysis dependent EXAM: CT HEAD WITHOUT CONTRAST TECHNIQUE: Contiguous axial images were obtained from the base of the skull through the vertex without intravenous contrast. COMPARISON:  01/15/2018 FINDINGS: Brain: No acute infarct or hemorrhage. Chronic small vessel ischemic changes are seen within the bilateral basal ganglia and periventricular white matter. The lateral ventricles and remaining midline structures are unremarkable. No acute extra-axial  fluid collections. No mass effect. Vascular: No hyperdense vessel or unexpected calcification. Skull: Normal. Negative for fracture or focal lesion. Sinuses/Orbits: Minimal secretions within the right sphenoid sinus. Remaining sinuses are clear. Other: None. IMPRESSION: 1. Stable chronic small vessel ischemic changes.  No acute process. Electronically Signed   By: Randa Ngo M.D.   On: 08/16/2019 21:53   DG Chest Port 1 View  Result Date: 08/16/2019 CLINICAL DATA:  Fever. EXAM: PORTABLE CHEST 1 VIEW COMPARISON:  August 14, 2019 FINDINGS: There is stable right-sided venous catheter positioning. Mild, chronic appearing increased lung markings are seen without evidence of acute infiltrate, pleural effusion or pneumothorax. There is stable moderate to marked severity enlargement of the cardiac silhouette. The visualized skeletal structures are unremarkable. IMPRESSION: Stable cardiomegaly without evidence of acute or active cardiopulmonary disease. Electronically Signed   By: Virgina Norfolk M.D.   On: 08/16/2019 20:50        Scheduled Meds: . acetaminophen  650 mg Rectal Q6H  . amLODipine  10 mg Oral Daily  . Chlorhexidine Gluconate Cloth  6 each Topical Daily  . furosemide  80 mg Oral QODAY  . heparin  5,000 Units Subcutaneous Q12H  . vancomycin variable dose per unstable renal function (pharmacist dosing)   Does not apply See admin instructions   Continuous Infusions: . sodium chloride 75 mL/hr at  08/17/19 1131  . ceFEPime (MAXIPIME) IV Stopped (08/17/19 1012)  . metronidazole Stopped (08/17/19 3212)     LOS: 1 day    Time spent: 25 minutes    Sharen Hones, MD [facility]   To contact the attending provider between 7A-7P or the covering provider during after hours 7P-7A, please log into the web site [email or web address] and access using universal [place] password for that web site. If you do not have the password, please call the hospital operator.  08/17/2019, 12:01 PM

## 2019-08-17 NOTE — Consult Note (Signed)
Reason for Consult: AMS  Requesting Physician: Dr. Sidney Ace   CC: confusion/obtundation   HPI: Amber Dixon is an 69 y.o. female   with a known history of type 2 diabetes mellitus, hypertension and chronic kidney disease, who presented to the emergency room with acute onset of altered mental status with confusion and decreased responsiveness with associated elevated blood pressure along with fever  The patient was seen in [facility] on 4/22 for elevated blood pressure and similar confusion after hemodialysis which has resolved with improved blood pressure.  Prior to [facility] admission patient was at [facility].   Dixon Amber according to her son she could not speak.  No witnessed seizures.  She developed fever and chills today.  No reported dysuria, oliguria or hematuria or flank pain.  No chest pain or dyspnea or palpitations. EKG with sinus tachycardia, fevers.    Past Medical History:  Diagnosis Date  . Diabetes mellitus without complication ([place])   . Hypertension   . Renal disorder     Past Surgical History:  Procedure Laterality Date  . AV FISTULA PLACEMENT Right 04/24/2019   Procedure: ARTERIOVENOUS (AV) FISTULA CREATION RIGHT ARM;  Surgeon: Waynetta Sandy, MD;  Location: [facility];  Service: Vascular;  Laterality: Right;  . AV FISTULA PLACEMENT Right   . HIP FRACTURE SURGERY Right    in [place]  . IR FLUORO GUIDE CV LINE RIGHT  04/02/2019  . IR FLUORO GUIDE CV LINE RIGHT  04/13/2019  . IR US GUIDE VASC ACCESS RIGHT  04/02/2019  . LEG SURGERY      No family history on file.  Social History:  reports that she has never smoked. She has never used smokeless tobacco. She reports previous drug use. No history on file for alcohol.  No Known Allergies  Medications: I have reviewed the patient's current medications.  ROS: Unable to obtain due to not following commads  Physical Examination: Blood pressure (!) 184/76, pulse (!) 102, temperature (!) 101.3 F (38.5 C),  temperature source Rectal, resp. rate (!) 23, height 4\' 11"  (1.499 m), weight 33.6 kg, SpO2 99 %.  Pt opens her eyes to pain.  Doesn't follow commands Grimace to pain Withdraw from painful stimuli bilaterally upper and lower extremities Blink to threat present Generalized weakness.    Laboratory Studies:   Basic Metabolic Panel: Recent Labs  Lab 08/14/19 1831 08/16/19 2029 08/17/19 0243 08/17/19 0656  NA 130* 134* 134*  --   K 3.5 3.8 3.9  --   CL 92* 92* 93*  --   CO2 26 32 29  --   GLUCOSE 236* 224* 260*  --   BUN 9 14 24*  --   CREATININE 1.71* 1.88* 2.60*  --   CALCIUM 7.9* 8.6* 8.1*  --   MG  --   --   --  2.3  PHOS  --   --   --  3.9    Liver Function Tests: Recent Labs  Lab 08/14/19 1831 08/16/19 2029  AST 39 48*  ALT 28 31  ALKPHOS 161* 155*  BILITOT 0.5 0.4  PROT 6.9 7.8  ALBUMIN 3.5 4.2   No results for input(s): LIPASE, AMYLASE in the last 168 hours. Recent Labs  Lab 08/14/19 1831 08/17/19 0243  AMMONIA 42* 16    CBC: Recent Labs  Lab 08/14/19 1831 08/16/19 2029 08/17/19 0243  WBC 5.2 6.4 6.6  NEUTROABS 4.1 5.1  --   HGB 12.3 12.1 10.2*  HCT 40.0 38.0  31.8*  MCV 85.8 82.4 82.2  PLT 168 215 176    Cardiac Enzymes: No results for input(s): CKTOTAL, CKMB, CKMBINDEX, TROPONINI in the last 168 hours.  BNP: Invalid input(s): POCBNP  CBG: Recent Labs  Lab 08/14/19 1735 08/17/19 0622  GLUCAP 224* 33*    Microbiology: Results for orders placed or performed during the hospital encounter of 08/16/19  Blood Culture (routine x 2)     Status: None (Preliminary result)   Collection Time: 08/16/19  8:29 PM   Specimen: BLOOD  Result Value Ref Range Status   Specimen Description BLOOD BLOOD LEFT FOREARM  Final   Special Requests   Final    BOTTLES DRAWN AEROBIC AND ANAEROBIC Blood Culture adequate volume   Culture   Final    NO GROWTH < 12 HOURS Performed at [facility], [address], [place], [place] [address]    Report  Status PENDING  Incomplete  Blood Culture (routine x 2)     Status: None (Preliminary result)   Collection Time: 08/16/19  8:29 PM   Specimen: BLOOD  Result Value Ref Range Status   Specimen Description BLOOD BLOOD LEFT ARM  Final   Special Requests   Final    BOTTLES DRAWN AEROBIC AND ANAEROBIC Blood Culture results may not be optimal due to an inadequate volume of blood received in culture bottles   Culture   Final    NO GROWTH < 12 HOURS Performed at [facility], [address] [place], [place] [address]    Report Status PENDING  Incomplete  Respiratory Panel by RT PCR (Flu A&B, Covid) - Urine, Clean Catch     Status: None   Collection Time: 08/16/19  8:47 PM   Specimen: Urine, Clean Catch  Result Value Ref Range Status   SARS Coronavirus 2 by RT PCR NEGATIVE NEGATIVE Final    Comment: (NOTE) SARS-CoV-2 target nucleic acids are NOT DETECTED. The SARS-CoV-2 RNA is generally detectable in upper respiratoy specimens during the acute phase of infection. The lowest concentration of SARS-CoV-2 viral copies this assay can detect is 131 copies/mL. A negative result does not preclude SARS-Cov-2 infection and should not be used as the sole basis for treatment or other patient management decisions. A negative result may occur with  improper specimen collection/handling, submission of specimen other than nasopharyngeal swab, presence of viral mutation(s) within the areas targeted by this assay, and inadequate number of viral copies (<131 copies/mL). A negative result must be combined with clinical observations, patient history, and epidemiological information. The expected result is Negative. Fact Sheet for Patients:  PinkCheek.be Fact Sheet for Healthcare Providers:  GravelBags.it This test is not yet ap proved or cleared by the [place] FDA and  has been authorized for detection and/or diagnosis of SARS-CoV-2  by FDA under an Emergency Use Authorization (EUA). This EUA will remain  in effect (meaning this test can be used) for the duration of the COVID-19 declaration under Section 564(b)(1) of the Act, 21 U.S.C. section 360bbb-3(b)(1), unless the authorization is terminated or revoked sooner.    Influenza A by PCR NEGATIVE NEGATIVE Final   Influenza B by PCR NEGATIVE NEGATIVE Final    Comment: (NOTE) The Xpert Xpress SARS-CoV-2/FLU/RSV assay is intended as an aid in  the diagnosis of influenza from Nasopharyngeal swab specimens and  should not be used as a sole basis for treatment. Nasal washings and  aspirates are unacceptable for Xpert Xpress SARS-CoV-2/FLU/RSV  testing. Fact Sheet for Patients: PinkCheek.be Fact  Sheet for Healthcare Providers: GravelBags.it This test is not yet approved or cleared by the [place] and  has been authorized for detection and/or diagnosis of SARS-CoV-2 by  FDA under an Emergency Use Authorization (EUA). This EUA will remain  in effect (meaning this test can be used) for the duration of the  Covid-19 declaration under Section 564(b)(1) of the Act, 21  U.S.C. section 360bbb-3(b)(1), unless the authorization is  terminated or revoked. Performed at [facility], [address] [place], [place] [address]     Coagulation Studies: Recent Labs    08/16/19 Jul 21, 2027  LABPROT 12.1  INR 0.9    Urinalysis:  Recent Labs  Lab 08/16/19 2023  COLORURINE YELLOW  LABSPEC 1.020  PHURINE 8.5*  GLUCOSEU 250*  HGBUR TRACE*  BILIRUBINUR NEGATIVE  KETONESUR NEGATIVE  PROTEINUR >300*  NITRITE NEGATIVE  LEUKOCYTESUR NEGATIVE    Lipid Panel:     Component Value Date/Time   TRIG 119 03/31/2019 0902    HgbA1C:  Lab Results  Component Value Date   HGBA1C 4.9 06/09/2019    Urine Drug Screen:  No results found for: LABOPIA, COCAINSCRNUR, LABBENZ, AMPHETMU, THCU, LABBARB  Alcohol  Level: No results for input(s): ETH in the last 168 hours.  Other results: EKG: normal EKG, normal sinus rhythm, unchanged from previous tracings.  Imaging: CT Head Wo Contrast  Result Date: 08/16/2019 CLINICAL DATA:  Sepsis, nausea, vomiting, diarrhea, dialysis dependent EXAM: CT HEAD WITHOUT CONTRAST TECHNIQUE: Contiguous axial images were obtained from the base of the skull through the vertex without intravenous contrast. COMPARISON:  01/15/2018 FINDINGS: Brain: No acute infarct or hemorrhage. Chronic small vessel ischemic changes are seen within the bilateral basal ganglia and periventricular white matter. The lateral ventricles and remaining midline structures are unremarkable. No acute extra-axial fluid collections. No mass effect. Vascular: No hyperdense vessel or unexpected calcification. Skull: Normal. Negative for fracture or focal lesion. Sinuses/Orbits: Minimal secretions within the right sphenoid sinus. Remaining sinuses are clear. Other: None. IMPRESSION: 1. Stable chronic small vessel ischemic changes.  No acute process. Electronically Signed   By: Randa Ngo M.D.   On: 08/16/2019 21:53   DG Chest Port 1 View  Result Date: 08/16/2019 CLINICAL DATA:  Fever. EXAM: PORTABLE CHEST 1 VIEW COMPARISON:  August 14, 2019 FINDINGS: There is stable right-sided venous catheter positioning. Mild, chronic appearing increased lung markings are seen without evidence of acute infiltrate, pleural effusion or pneumothorax. There is stable moderate to marked severity enlargement of the cardiac silhouette. The visualized skeletal structures are unremarkable. IMPRESSION: Stable cardiomegaly without evidence of acute or active cardiopulmonary disease. Electronically Signed   By: Virgina Norfolk M.D.   On: 08/16/2019 20:50     Assessment/Plan: 69 y.o. female   with a known history of type 2 diabetes mellitus, hypertension and chronic kidney disease, who presented to the emergency room with acute onset  of altered mental status with confusion and decreased responsiveness with associated elevated blood pressure along with fever  The patient was seen in [facility] on 4/22 for elevated blood pressure and similar confusion after hemodialysis which has resolved with improved blood pressure.  Prior to [facility] admission patient was at [facility].   Dixon Amber according to her son she could not speak.  No witnessed seizures.  She developed fever and chills today.  No reported dysuria, oliguria or hematuria or flank pain.  No chest pain or dyspnea or palpitations. EKG with sinus tachycardia, fevers.   - No clear focality on  examination  - Multiple hospital admissions - current exam related to metabolic encephalopathy in setting of fevers and possible infections.  - Pt is an HD patient - Likely family discussions on goals of care. - I don't think there is a need for MRI as I don't think this is a central process.  - CTH no acute abnormality with small vessel disease in setting of HTN 08/17/2019, 12:04 PM

## 2019-08-18 DIAGNOSIS — R6510 Systemic inflammatory response syndrome (SIRS) of non-infectious origin without acute organ dysfunction: Secondary | ICD-10-CM

## 2019-08-18 DIAGNOSIS — I161 Hypertensive emergency: Secondary | ICD-10-CM

## 2019-08-18 LAB — GLUCOSE, CAPILLARY
Glucose-Capillary: 154 mg/dL — ABNORMAL HIGH (ref 70–99)
Glucose-Capillary: 128 mg/dL — ABNORMAL HIGH (ref 70–99)
Glucose-Capillary: 120 mg/dL — ABNORMAL HIGH (ref 70–99)
Glucose-Capillary: 117 mg/dL — ABNORMAL HIGH (ref 70–99)
Glucose-Capillary: 138 mg/dL — ABNORMAL HIGH (ref 70–99)

## 2019-08-18 LAB — URINE CULTURE: Culture: NO GROWTH

## 2019-08-18 LAB — AMMONIA: Ammonia: 30 umol/L (ref 9–35)

## 2019-08-18 MED ORDER — HYDRALAZINE HCL 20 MG/ML IJ SOLN
10.0000 mg | INTRAMUSCULAR | Status: DC | PRN
  Administered 2019-08-18 – 2019-08-19 (×7): 10 mg via INTRAVENOUS
  Filled 2019-08-18 (×8): qty 1

## 2019-08-18 NOTE — Progress Notes (Signed)
[facility]  ROUNDING NOTE   Subjective:   Patient is lethargic and confused. Unable to answer questions.   Tmax 101.6  Objective:  Vital signs in last 24 hours:  Temp:  [98.2 F (36.8 C)-101.6 F (38.7 C)] 98.2 F (36.8 C) (04/26 0800) Pulse Rate:  [88-102] 94 (04/26 0900) Resp:  [16-33] 16 (04/26 0800) BP: (156-187)/(65-98) 158/68 (04/26 0900) SpO2:  [92 %-100 %] 100 % (04/26 0900)  Weight change:  Filed Weights   08/16/19 2023 08/17/19 0624  Weight: 31.7 kg 33.6 kg    Intake/Output: I/O last 3 completed shifts: In: 1423.2 [I.V.:583.9; IV Piggyback:839.3] Out: 0    Intake/Output this shift:  No intake/output data recorded.  Physical Exam: General: NAD, laying in bed  Head: Normocephalic, atraumatic. Moist oral mucosal membranes  Eyes: Anicteric, PERRL  Neck: Supple, trachea midline  Lungs:  Clear to auscultation  Heart: Regular rate and rhythm  Abdomen:  Soft, nontender  Extremities:  no peripheral edema.  Neurologic: lethargic  Skin: No lesions  Access: Right AVF    Basic Metabolic Panel: Recent Labs  Lab 08/14/19 1831 08/16/19 2029 08/17/19 0243 08/17/19 0656  NA 130* 134* 134*  --   K 3.5 3.8 3.9  --   CL 92* 92* 93*  --   CO2 26 32 29  --   GLUCOSE 236* 224* 260*  --   BUN 9 14 24*  --   CREATININE 1.71* 1.88* 2.60*  --   CALCIUM 7.9* 8.6* 8.1*  --   MG  --   --   --  2.3  PHOS  --   --   --  3.9    Liver Function Tests: Recent Labs  Lab 08/14/19 1831 08/16/19 2029  AST 39 48*  ALT 28 31  ALKPHOS 161* 155*  BILITOT 0.5 0.4  PROT 6.9 7.8  ALBUMIN 3.5 4.2   No results for input(s): LIPASE, AMYLASE in the last 168 hours. Recent Labs  Lab 08/14/19 1831 08/17/19 0243 08/18/19 0448  AMMONIA 42* 16 30    CBC: Recent Labs  Lab 08/14/19 1831 08/16/19 2029 08/17/19 0243  WBC 5.2 6.4 6.6  NEUTROABS 4.1 5.1  --   HGB 12.3 12.1 10.2*  HCT 40.0 38.0 31.8*  MCV 85.8 82.4 82.2  PLT 168 215 176    Cardiac  Enzymes: No results for input(s): CKTOTAL, CKMB, CKMBINDEX, TROPONINI in the last 168 hours.  BNP: Invalid input(s): POCBNP  CBG: Recent Labs  Lab 08/17/19 1551 08/17/19 1929 08/17/19 2329 08/18/19 0335 08/18/19 0752  GLUCAP 145* 151* 176* 154* 128*    Microbiology: Results for orders placed or performed during the hospital encounter of 08/16/19  Urine culture     Status: None   Collection Time: 08/16/19  8:23 PM   Specimen: In/Out Cath Urine  Result Value Ref Range Status   Specimen Description   Final    IN/OUT CATH URINE Performed at [facility], [address], [place], [place] [address]    Special Requests   Final    NONE Performed at [facility], [address] [place], [place] [address]    Culture   Final    NO GROWTH Performed at [facility], [place] [address], [place], [place] [address]    Report Status 08/18/2019 FINAL  Final  Blood Culture (routine x 2)     Status: None (Preliminary result)   Collection Time: 08/16/19  8:29 PM   Specimen: BLOOD  Result Value Ref Range Status   Specimen Description BLOOD BLOOD LEFT FOREARM  Final   Special Requests   Final    BOTTLES DRAWN AEROBIC AND ANAEROBIC Blood Culture adequate volume   Culture   Final    NO GROWTH 2 DAYS Performed at [facility], [address], [place], [place] [address]    Report Status PENDING  Incomplete  Blood Culture (routine x 2)     Status: None (Preliminary result)   Collection Time: 08/16/19  8:29 PM   Specimen: BLOOD  Result Value Ref Range Status   Specimen Description BLOOD BLOOD LEFT ARM  Final   Special Requests   Final    BOTTLES DRAWN AEROBIC AND ANAEROBIC Blood Culture results may not be optimal due to an inadequate volume of blood received in culture bottles   Culture   Final    NO GROWTH 2 DAYS Performed at [facility], [address] [place], [place] [address]    Report Status PENDING  Incomplete  Respiratory Panel by RT  PCR (Flu A&B, Covid) - Urine, Clean Catch     Status: None   Collection Time: 08/16/19  8:47 PM   Specimen: Urine, Clean Catch  Result Value Ref Range Status   SARS Coronavirus 2 by RT PCR NEGATIVE NEGATIVE Final    Comment: (NOTE) SARS-CoV-2 target nucleic acids are NOT DETECTED. The SARS-CoV-2 RNA is generally detectable in upper respiratoy specimens during the acute phase of infection. The lowest concentration of SARS-CoV-2 viral copies this assay can detect is 131 copies/mL. A negative result does not preclude SARS-Cov-2 infection and should not be used as the sole basis for treatment or other patient management decisions. A negative result may occur with  improper specimen collection/handling, submission of specimen other than nasopharyngeal swab, presence of viral mutation(s) within the areas targeted by this assay, and inadequate number of viral copies (<131 copies/mL). A negative result must be combined with clinical observations, patient history, and epidemiological information. The expected result is Negative. Fact Sheet for Patients:  PinkCheek.be Fact Sheet for Healthcare Providers:  GravelBags.it This test is not yet ap proved or cleared by the [place] FDA and  has been authorized for detection and/or diagnosis of SARS-CoV-2 by FDA under an Emergency Use Authorization (EUA). This EUA will remain  in effect (meaning this test can be used) for the duration of the COVID-19 declaration under Section 564(b)(1) of the Act, 21 U.S.C. section 360bbb-3(b)(1), unless the authorization is terminated or revoked sooner.    Influenza A by PCR NEGATIVE NEGATIVE Final   Influenza B by PCR NEGATIVE NEGATIVE Final    Comment: (NOTE) The Xpert Xpress SARS-CoV-2/FLU/RSV assay is intended as an aid in  the diagnosis of influenza from Nasopharyngeal swab specimens and  should not be used as a sole basis for treatment. Nasal  washings and  aspirates are unacceptable for Xpert Xpress SARS-CoV-2/FLU/RSV  testing. Fact Sheet for Patients: PinkCheek.be Fact Sheet for Healthcare Providers: GravelBags.it This test is not yet approved or cleared by the [place] FDA and  has been authorized for detection and/or diagnosis of SARS-CoV-2 by  FDA under an Emergency Use Authorization (EUA). This EUA will remain  in effect (meaning this test can be used) for the duration of the  Covid-19 declaration under Section 564(b)(1) of the Act, 21  U.S.C. section 360bbb-3(b)(1), unless the authorization is  terminated or revoked. Performed at [facility], [address], [place], [place] [address]  Coagulation Studies: Recent Labs    08/16/19 07/17/2027  LABPROT 12.1  INR 0.9    Urinalysis: Recent Labs    08/16/19 16-Jul-2021  COLORURINE YELLOW  LABSPEC 1.020  PHURINE 8.5*  GLUCOSEU 250*  HGBUR TRACE*  BILIRUBINUR NEGATIVE  KETONESUR NEGATIVE  PROTEINUR >300*  NITRITE NEGATIVE  LEUKOCYTESUR NEGATIVE      Imaging: CT Head Wo Contrast  Result Date: 08/16/2019 CLINICAL DATA:  Sepsis, nausea, vomiting, diarrhea, dialysis dependent EXAM: CT HEAD WITHOUT CONTRAST TECHNIQUE: Contiguous axial images were obtained from the base of the skull through the vertex without intravenous contrast. COMPARISON:  01/15/2018 FINDINGS: Brain: No acute infarct or hemorrhage. Chronic small vessel ischemic changes are seen within the bilateral basal ganglia and periventricular white matter. The lateral ventricles and remaining midline structures are unremarkable. No acute extra-axial fluid collections. No mass effect. Vascular: No hyperdense vessel or unexpected calcification. Skull: Normal. Negative for fracture or focal lesion. Sinuses/Orbits: Minimal secretions within the right sphenoid sinus. Remaining sinuses are clear. Other: None. IMPRESSION: 1. Stable chronic small  vessel ischemic changes.  No acute process. Electronically Signed   By: Randa Ngo M.D.   On: 08/16/2019 21:53   DG Chest Port 1 View  Result Date: 08/16/2019 CLINICAL DATA:  Fever. EXAM: PORTABLE CHEST 1 VIEW COMPARISON:  August 14, 2019 FINDINGS: There is stable right-sided venous catheter positioning. Mild, chronic appearing increased lung markings are seen without evidence of acute infiltrate, pleural effusion or pneumothorax. There is stable moderate to marked severity enlargement of the cardiac silhouette. The visualized skeletal structures are unremarkable. IMPRESSION: Stable cardiomegaly without evidence of acute or active cardiopulmonary disease. Electronically Signed   By: Virgina Norfolk M.D.   On: 08/16/2019 20:50     Medications:   . ceFEPime (MAXIPIME) IV 1 g (08/18/19 0816)  . metronidazole 500 mg (08/18/19 0852)   . amLODipine  10 mg Oral Daily  . Chlorhexidine Gluconate Cloth  6 each Topical Daily  . furosemide  80 mg Oral QODAY  . heparin  5,000 Units Subcutaneous Q12H  . insulin aspart  0-6 Units Subcutaneous Q4H  . [START ON 08/19/2019] vancomycin  500 mg Intravenous Q T,Th,Sa-HD   acetaminophen **OR** acetaminophen, hydrALAZINE, ipratropium-albuterol, labetalol, magnesium hydroxide, ondansetron **OR** ondansetron (ZOFRAN) IV  Assessment/ Plan:  [name] is a 69 y.o. Hispanic female with end stage renal disease on hemodialysis, diabetes mellitus, hypertension, who was admitted 08/16/2019 for Encephalopathy [G93.40] Sepsis ([place]) [A41.9] Fever, unspecified fever cause [R50.9] Altered mental status, unspecified altered mental status type [R41.82]  [facility] ([place]) [facility] [address] [facility] [place] TTS Right AVF  1. End stage renal disease: TTS  - Dialysis for tomorrow.  2. Hypertension: elevated overnight. Home regimen of amlodipine and furosemide. PRN hydralazine - Continue PO agents   3. Anemia of chronic kidney disease:  hemoglobin 10.2 - EPO with HD treatment.   4. Secondary Hyperparathyroidism: calcium and phosphorus at goal. Not currently on binders.   5. Sepsis: blood cultures from 4/24 with no growth. Empiric metronidazole, vanco and cefepime.    LOS: 2 Ignazio Kincaid 4/26/20219:39 AM

## 2019-08-18 NOTE — TOC Initial Note (Addendum)
Transition of Care [facility]) - Initial/Assessment Note    Patient Details  Name: Amber Dixon MRN: [id number] Date of Birth: [birth year]  Transition of Care [facility]) CM/SW Contact:    Magnus Ivan, LCSW Phone Number: 08/18/2019, 1:46 PM  Clinical Narrative:                Per rounds and chart review, patient is disoriented x 4. Daughter-in-law Lenna Sciara speaks [place]. All other family is Spanish speaking. CSW spoke with Melissa via phone for Readmission Screen. Melissa reported patient has been living with them for the last few weeks. Previously, patient lived in [place]. Patient is uninsured and does not have a PCP or Pharmacy. Lenna Sciara was interested in [facility] and Medication Management resources which CSW will provide. Patient has a walker at home. No HH or SNF history. Melissa's husband (patient's son) Gwyneth Revels provides patient with transportation.   2:30- CSW provided [employer] Booklet and [facility]/Medication Management application to patient's son Federico at bedside- per [facility] request. Provided Spanish versions of both. Made [facility] Representative Jennifer aware of referral. Informed Anderson Malta no d/c plans yet. CSW will continue to follow.    Expected Discharge Plan: Home/Self Care Barriers to Discharge: Continued Medical Work up   Patient Goals and CMS Choice     Choice offered to / list presented to : Adult Children  Expected Discharge Plan and Services Expected Discharge Plan: Home/Self Care       Living arrangements for the past 2 months: Single Family Home                                      Prior Living Arrangements/Services Living arrangements for the past 2 months: Single Family Home Lives with:: Adult Children, Relatives Patient language and need for interpreter reviewed:: Yes        Need for Family Participation in Patient Care: Yes (Comment) Care giver support system in place?: Yes  (comment) Current home services: DME Criminal Activity/Legal Involvement Pertinent to Current Situation/Hospitalization: No - Comment as needed  Activities of Daily Living      Permission Sought/Granted                  Emotional Assessment   Attitude/Demeanor/Rapport: Unable to Assess Affect (typically observed): Unable to Assess Orientation: : (Disoriented x 4) Alcohol / Substance Use: Not Applicable Psych Involvement: No (comment)  Admission diagnosis:  Encephalopathy [G93.40] Sepsis ([place]) [A41.9] Fever, unspecified fever cause [R50.9] Altered mental status, unspecified altered mental status type [R41.82] Patient Active Problem List   Diagnosis Date Noted  . Sepsis ([place]) 08/17/2019  . Encephalopathy 08/16/2019  . ESRD (end stage renal disease) ([place])   . Fluid overload 06/09/2019  . Chest pain 06/09/2019  . Elevated troponin 06/09/2019  . Diabetes mellitus type 2 in nonobese ([place]) 06/09/2019  . Anemia of chronic disease 06/09/2019  . Nausea and vomiting 06/09/2019  . Pulmonary edema 06/09/2019  . ESRD (end stage renal disease) on dialysis ([place]) 04/10/2019  . Acute respiratory failure with hypoxemia ([place]) 03/31/2019  . Acute on chronic diastolic (congestive) heart failure ([place]) 03/24/2019  . Hypertension 03/22/2019  . Anemia of renal disease 03/22/2019  . Proteinuria 03/22/2019  . Chest pain 03/22/2019  . Acute on chronic renal failure ([place]) 03/21/2019   PCP:  Patient, No Pcp Per Pharmacy:   [facility] [place], [place] -  [place] [address] Phone: [phone] Fax: [phone]  [place], [place] [address] Phone: [phone] Fax: [place], [place] [address] Phone: [phone] Fax: [phone]     Social Determinants of Health (SDOH) Interventions    Readmission Risk Interventions Readmission Risk Prevention Plan 08/18/2019  Transportation Screening Complete  Medication Review (RN Care Manager) Complete  PCP or Specialist appointment within 3-5 days of discharge Complete  HRI or [place] Complete

## 2019-08-18 NOTE — Progress Notes (Signed)
Pharmacy Antibiotic Note  Amber Dixon is a 69 y.o. female admitted on 08/16/2019 with sepsis w/ possible encephalopathy w/ slightly elevated ammonia and LA of 2.0, patient is spanish-speaking originally from [place] and has ran out of her bp meds and has been taking [place] off-brand meds. Currently in hypertensive crisis w/ h/o HD last known session 04/22 (TThSa). Pharmacy has been consulted for vanc/cefepime/flagyl dosing.  Plan: On HD T,Th,Sa per Nephrology.  Vancomycin: Continue Vancomycin 500mg  IV w/HD T,Th,Sa. F/u each day and assess if needs HD.  Cefepime: Continue Cefepime 1g IV q24h  Metronidazole: Metronidazole 500mg  IV q8h (no renal adjustment needed)  Height: 4\' 11"  (149.9 cm) Weight: 33.6 kg (74 lb 1.2 oz) IBW/kg (Calculated) : 43.2  Temp (24hrs), Avg:99.3 F (37.4 C), Min:97.4 F (36.3 C), Max:100.9 F (38.3 C)  Recent Labs  Lab 08/14/19 1831 08/16/19 2029 08/16/19 2255 08/17/19 0243  WBC 5.2 6.4  --  6.6  CREATININE 1.71* 1.88*  --  2.60*  LATICACIDVEN  --  2.0* 1.7  --     Estimated Creatinine Clearance: 10.7 mL/min (A) (by C-G formula based on SCr of 2.6 mg/dL (H)).    No Known Allergies  Thank you for allowing pharmacy to be a part of this patient's care.  Paulina Fusi, PharmD, BCPS 08/18/2019 3:14 PM

## 2019-08-18 NOTE — Progress Notes (Signed)
SLP Cancellation Note  Patient Details Name: Georganna Maxson MRN: [id number] DOB: 1950-11-28   Cancelled treatment:       Reason Eval/Treat Not Completed: Patient not medically ready;Patient's level of consciousness;Medical issues which prohibited therapy;Fatigue/lethargy limiting ability to participate(chart reviewed; consulted NSG re: pt's status today)  NSG reported pt was too lethargic for safe participation in evaluation today. Met w/ pt and Son in room w/ Spanish Interpreter present. Pt was lethargic w/ no meaningful alertness. Explained to Son that ST services would continue to monitor pt's status next 1-2 days for BSE, least restrictive diet consistency. Recommend Pills via alternative means at this time; frequent oral care for hygiene and stimulation of swallowing. Son and NSG agreed.     Orinda Kenner, MS, CCC-SLP Trinitey Roache 08/18/2019, 12:21 PM

## 2019-08-18 NOTE — Progress Notes (Signed)
[place] visited pt.'s rm. while rounding on ICU; pt.'s son Fredrico in chair @ bedside.  English does not seem to be son's first language and so conversation was somewhat brief; son has questions re: source and nature of pt.'s infection; Tower relayed question to Rn to address.  Son said pt. was more talkative yesterday but pt.'s vitals were less stable; today pt. is more stable, but less responsive --> has mostly been asleep, son said.  Son appears to be coping effectively.  CH remains available as needed.    08/18/19 1500  Clinical Encounter Type  Visited With Family  Visit Type Initial;Psychological support;Social support  Spiritual Encounters  Spiritual Needs Emotional  Stress Factors  Family Stress Factors Lack of knowledge

## 2019-08-18 NOTE — Progress Notes (Signed)
Hemodialysis patient known at [place], also known as IT consultant, Chair time is TTS 11:45, patient is a 4 hour runner and has not miss any treatments. Last treatment was Saturday 4/24. Per clinic patient is undocumented with Emergency Medicaid, takes tums for binder and is transported by family. All family is Spanish speaking only, except for daughter Lenna Sciara. Please contact me with any dialysis placement concerns.

## 2019-08-18 NOTE — Progress Notes (Signed)
PT Cancellation Note  Patient Details Name: Amber Dixon MRN: [id number] DOB: 1950-07-18   Cancelled Treatment:    Reason Eval/Treat Not Completed: Fatigue/lethargy limiting ability to participate(RN reports pt to be laethargic this morning, not very interactive, like would not be able to participate in PT evalution. WIll hold at this time, resume services once appropriate.)  10:28 AM, 08/18/19 Etta Grandchild, PT, DPT Physical Therapist - [facility]  [phone] ([place])   [place] C 08/18/2019, 10:28 AM

## 2019-08-18 NOTE — Progress Notes (Signed)
PROGRESS NOTE  Amber Dixon JSE:[id number] DOB: Apr 15, 1951   PCP: Patient, No Pcp Per  Patient is from: home  DOA: 08/16/2019 LOS: 2  Brief Narrative / Interim history: 69 year old female with history of ESRD on HD TTS, DM-2, HTN, severe malnutrition, debility and language barrier presented with fever, altered mental status and elevated blood pressure.   In ED, hypotensive with SBP in 200s. Febrile to 102.2. Tachycardic to 130s. 97% on RA. Na 130. LA 2.0. CBC normal. BNP elevated to 3300 (previously 300). COVID-19 and influenza PCR negative. CT head without contrast without acute finding. CXR with improved heterogeneous opacity. UA with >300 protein. EKG without acute finding. Cultures obtained. Started on broad-spectrum antibiotics. Neurology and nephrology consulted.  Neurology does not feel MRI is indicated. Suggested palliative care consult. Thyroid panel, ammonia, urine and blood cultures nonrevealing so far.  Subjective: Seen and examined earlier this morning. Remains somnolent. Barely retracts to noxious stimulus. Does not appear to be in distress. Fever curve downtrending. Blood pressure improved.  Objective: Vitals:   08/18/19 1000 08/18/19 1100 08/18/19 1200 08/18/19 1300  BP: (!) 163/69 (!) 157/65 (!) 173/76 (!) 167/70  Pulse: 94 90 95 95  Resp:    20  Temp:    (!) 97.4 F (36.3 C)  TempSrc:    Rectal  SpO2: 100% 100% 100% 100%  Weight:      Height:        Intake/Output Summary (Last 24 hours) at 08/18/2019 1418 Last data filed at 08/18/2019 1143 Gross per 24 hour  Intake 521.08 ml  Output 0 ml  Net 521.08 ml   Filed Weights   08/16/19 2023 08/17/19 0624  Weight: 31.7 kg 33.6 kg    Examination:  GENERAL: Frail and chronically ill-appearing. HEENT: PERRL. No facial asymmetry. NECK: Supple.  No apparent JVD.  RESP: 100% on 2 L. No IWOB. Good air movement bilaterally. CVS:  RRR. Heart sounds normal.  ABD/GI/GU: BS present. Soft. Non tender.    MSK/EXT: Global muscle mass and subcu fat loss. SKIN: no apparent skin lesion or wound NEURO: Very somnolent and barely responds to noxious stimulus. PERRL. No apparent focal neuro deficit. PSYCH: Somnolent. No agitation or distress.  Procedures:  None  Microbiology summarized: WVPXT-06 and influenza PCR negative. Urine culture negative. Blood culture negative.  Assessment & Plan: SIRS: Patient with fever, tachycardia and altered mental status but no leukocytosis or clear source of infection so far. She has HD cath but blood work negative. Fever curve down trended. Tachycardia resolved. -Curbside ID for insight pending palliative care meeting  Acute metabolic encephalopathy: Work-up including CT head without contrast, TFT, ammonia and cultures unrevealing. Remains very somnolent. Barely responds to noxious stimuli. No focal neuro symptoms but difficult exam due to mental status. PRES syndrome? -Appreciate neurology insight  Hypertensive emergency: Presented with SBP in 200s, fluid overload and encephalopathy. BNP elevated to 3000. BP improving. -Continue amlodipine and as needed labetalol. -Continue Lasix per nephrology  ESRD/bone marrow disorder: On HD TTS. -Nephrology managing.  Anemia of chronic disease: H&H stable. -Continue monitoring  Severe malnutrition:Body mass index is 14.96 kg/m. Patient with global muscle mass and subcu fat loss. Has been n.p.o. since admission due to encephalopathy. -We will clarify with family about alternate feeding means  Goal of care discussion: Patient with comorbidities as above. Remains encephalopathic over the last 3 days. Severely malnourished. She is a still full code which I believe will pose more harm than benefit.  -Consulted palliative care  Nutrition:  Patient is n.p.o. due to encephalopathy. -Need to clarify goals of care including means of feeding                DVT prophylaxis: subq heparin Code Status: full  code Family Communication: Updated patients son, Clifton James using [place] interpretor with ID [phone] over the phone.  Discharge barrier: Encephalopathy Patient is from: home Final disposition: to be determined  Consultants:  Neurology Nephrology PMT   Sch Meds:  Scheduled Meds: . amLODipine  10 mg Oral Daily  . Chlorhexidine Gluconate Cloth  6 each Topical Daily  . furosemide  80 mg Oral QODAY  . heparin  5,000 Units Subcutaneous Q12H  . insulin aspart  0-6 Units Subcutaneous Q4H  . [START ON 08/19/2019] vancomycin  500 mg Intravenous Q T,Th,Sa-HD   Continuous Infusions: . ceFEPime (MAXIPIME) IV 1 g (08/18/19 0816)  . metronidazole 500 mg (08/18/19 0852)   PRN Meds:.acetaminophen **OR** acetaminophen, hydrALAZINE, ipratropium-albuterol, labetalol, magnesium hydroxide, ondansetron **OR** ondansetron (ZOFRAN) IV  Antimicrobials: Anti-infectives (From admission, onward)   Start     Dose/Rate Route Frequency Ordered Stop   08/19/19 1200  vancomycin (VANCOCIN) IVPB 500 mg/100 ml premix     500 mg 100 mL/hr over 60 Minutes Intravenous Every T-Th-Sa (Hemodialysis) 08/17/19 1500     08/17/19 0900  ceFEPIme (MAXIPIME) 2 g in sodium chloride 0.9 % 100 mL IVPB  Status:  Discontinued     2 g 200 mL/hr over 30 Minutes Intravenous Every 24 hours 08/17/19 0045 08/17/19 0244   08/17/19 0900  ceFEPIme (MAXIPIME) 1 g in sodium chloride 0.9 % 100 mL IVPB     1 g 200 mL/hr over 30 Minutes Intravenous Every 24 hours 08/17/19 0244     08/17/19 0246  vancomycin variable dose per unstable renal function (pharmacist dosing)  Status:  Discontinued      Does not apply See admin instructions 08/17/19 0246 08/17/19 1503   08/17/19 0045  ceFEPIme (MAXIPIME) 2 g in sodium chloride 0.9 % 100 mL IVPB  Status:  Discontinued     2 g 200 mL/hr over 30 Minutes Intravenous  Once 08/17/19 0036 08/17/19 0045   08/17/19 0045  metroNIDAZOLE (FLAGYL) IVPB 500 mg     500 mg 100 mL/hr over 60 Minutes Intravenous Every  8 hours 08/17/19 0036     08/17/19 0045  vancomycin (VANCOCIN) IVPB 1000 mg/200 mL premix  Status:  Discontinued     1,000 mg 200 mL/hr over 60 Minutes Intravenous  Once 08/17/19 0036 08/17/19 0044   08/16/19 2045  vancomycin (VANCOCIN) IVPB 1000 mg/200 mL premix     1,000 mg 200 mL/hr over 60 Minutes Intravenous  Once 08/16/19 2032 08/16/19 2254   08/16/19 2045  piperacillin-tazobactam (ZOSYN) IVPB 3.375 g     3.375 g 100 mL/hr over 30 Minutes Intravenous  Once 08/16/19 2032 08/16/19 2127       I have personally reviewed the following labs and images: CBC: Recent Labs  Lab 08/14/19 1831 08/16/19 2029 08/17/19 0243  WBC 5.2 6.4 6.6  NEUTROABS 4.1 5.1  --   HGB 12.3 12.1 10.2*  HCT 40.0 38.0 31.8*  MCV 85.8 82.4 82.2  PLT 168 215 176   BMP &GFR Recent Labs  Lab 08/14/19 1831 08/16/19 2029 08/17/19 0243 08/17/19 0656  NA 130* 134* 134*  --   K 3.5 3.8 3.9  --   CL 92* 92* 93*  --   CO2 26 32 29  --   GLUCOSE 236* 224* 260*  --  BUN 9 14 24*  --   CREATININE 1.71* 1.88* 2.60*  --   CALCIUM 7.9* 8.6* 8.1*  --   MG  --   --   --  2.3  PHOS  --   --   --  3.9   Estimated Creatinine Clearance: 10.7 mL/min (A) (by C-G formula based on SCr of 2.6 mg/dL (H)). Liver & Pancreas: Recent Labs  Lab 08/14/19 1831 08/16/19 2029  AST 39 48*  ALT 28 31  ALKPHOS 161* 155*  BILITOT 0.5 0.4  PROT 6.9 7.8  ALBUMIN 3.5 4.2   No results for input(s): LIPASE, AMYLASE in the last 168 hours. Recent Labs  Lab 08/14/19 1831 08/17/19 0243 08/18/19 0448  AMMONIA 42* 16 30   Diabetic: Recent Labs    08/17/19 0656  HGBA1C 5.1   Recent Labs  Lab 08/17/19 1929 08/17/19 2329 08/18/19 0335 08/18/19 0752 08/18/19 1127  GLUCAP 151* 176* 154* 128* 120*   Cardiac Enzymes: No results for input(s): CKTOTAL, CKMB, CKMBINDEX, TROPONINI in the last 168 hours. No results for input(s): PROBNP in the last 8760 hours. Coagulation Profile: Recent Labs  Lab 08/16/19 2029  INR 0.9    Thyroid Function Tests: Recent Labs    08/17/19 0656  TSH 3.362  FREET4 1.10   Lipid Profile: No results for input(s): CHOL, HDL, LDLCALC, TRIG, CHOLHDL, LDLDIRECT in the last 72 hours. Anemia Panel: No results for input(s): VITAMINB12, FOLATE, FERRITIN, TIBC, IRON, RETICCTPCT in the last 72 hours. Urine analysis:    Component Value Date/Time   COLORURINE YELLOW 08/16/2019 2023   APPEARANCEUR CLEAR 08/16/2019 2023   LABSPEC 1.020 08/16/2019 2023   PHURINE 8.5 (H) 08/16/2019 2023   GLUCOSEU 250 (A) 08/16/2019 2023   HGBUR TRACE (A) 08/16/2019 2023   [place] NEGATIVE 08/16/2019 2023   KETONESUR NEGATIVE 08/16/2019 2023   PROTEINUR >300 (A) 08/16/2019 2023   NITRITE NEGATIVE 08/16/2019 2023   LEUKOCYTESUR NEGATIVE 08/16/2019 2023   Sepsis Labs: Invalid input(s): PROCALCITONIN, [place]  Microbiology: Recent Results (from the past 240 hour(s))  Urine culture     Status: None   Collection Time: 08/16/19  8:23 PM   Specimen: In/Out Cath Urine  Result Value Ref Range Status   Specimen Description   Final    IN/OUT CATH URINE Performed at [facility], [address], [place], [place] [address]    Special Requests   Final    NONE Performed at [facility], [address] [place], [place] [address]    Culture   Final    NO GROWTH Performed at [facility], [place] [address] [place], [place] [address]    Report Status 08/18/2019 FINAL  Final  Blood Culture (routine x 2)     Status: None (Preliminary result)   Collection Time: 08/16/19  8:29 PM   Specimen: BLOOD  Result Value Ref Range Status   Specimen Description BLOOD BLOOD LEFT FOREARM  Final   Special Requests   Final    BOTTLES DRAWN AEROBIC AND ANAEROBIC Blood Culture adequate volume   Culture   Final    NO GROWTH 2 DAYS Performed at [facility], [address] [place], [place] [address]    Report Status PENDING  Incomplete  Blood Culture (routine x 2)     Status:  None (Preliminary result)   Collection Time: 08/16/19  8:29 PM   Specimen: BLOOD  Result Value Ref Range Status   Specimen Description BLOOD BLOOD LEFT ARM  Final   Special Requests   Final    BOTTLES DRAWN AEROBIC AND ANAEROBIC Blood Culture results may not be optimal due to an inadequate volume of blood received in culture bottles   Culture   Final    NO GROWTH 2 DAYS Performed at [facility], [address], [place], [place] [address]    Report Status PENDING  Incomplete  Respiratory Panel by RT PCR (Flu A&B, Covid) - Urine, Clean Catch     Status: None   Collection Time: 08/16/19  8:47 PM   Specimen: Urine, Clean Catch  Result Value Ref Range Status   SARS Coronavirus 2 by RT PCR NEGATIVE NEGATIVE Final    Comment: (NOTE) SARS-CoV-2 target nucleic acids are NOT DETECTED. The SARS-CoV-2 RNA is generally detectable in upper respiratoy specimens during the acute phase of infection. The lowest concentration of SARS-CoV-2 viral copies this assay can detect is 131 copies/mL. A negative result does not preclude SARS-Cov-2 infection and should not be used as the sole basis for treatment or other patient management decisions. A negative result may occur with  improper specimen collection/handling, submission of specimen other than nasopharyngeal swab, presence of viral mutation(s) within the areas targeted by this assay, and inadequate number of viral copies (<131 copies/mL). A negative result must be combined with clinical observations, patient history, and epidemiological information. The expected result is Negative. Fact Sheet for Patients:  PinkCheek.be Fact Sheet for Healthcare Providers:  GravelBags.it This test is not yet ap proved or cleared by the [place] FDA and  has been authorized for detection and/or diagnosis of SARS-CoV-2 by FDA under an Emergency Use Authorization (EUA). This EUA will remain  in  effect (meaning this test can be used) for the duration of the COVID-19 declaration under Section 564(b)(1) of the Act, 21 U.S.C. section 360bbb-3(b)(1), unless the authorization is terminated or revoked sooner.    Influenza A by PCR NEGATIVE NEGATIVE Final   Influenza B by PCR NEGATIVE NEGATIVE Final    Comment: (NOTE) The Xpert Xpress SARS-CoV-2/FLU/RSV assay is intended as an aid in  the diagnosis of influenza from Nasopharyngeal swab specimens and  should not be used as a sole basis for treatment. Nasal washings and  aspirates are unacceptable for Xpert Xpress SARS-CoV-2/FLU/RSV  testing. Fact Sheet for Patients: PinkCheek.be Fact Sheet for Healthcare Providers: GravelBags.it This test is not yet approved or cleared by the [place] FDA and  has been authorized for detection and/or diagnosis of SARS-CoV-2 by  FDA under an Emergency Use Authorization (EUA). This EUA will remain  in effect (meaning this test can be used) for the duration of the  Covid-19 declaration under Section 564(b)(1) of the Act, 21  U.S.C. section 360bbb-3(b)(1), unless the authorization is  terminated or revoked. Performed at [facility], [address], [place], [place] [address]     Radiology Studies: No results found.    Amber Tye T. [place]  If 7PM-7AM, please contact night-coverage [email or web address] Password [facility] 08/18/2019, 2:18 PM

## 2019-08-19 ENCOUNTER — Inpatient Hospital Stay: Payer: Medicaid Other

## 2019-08-19 DIAGNOSIS — Z515 Encounter for palliative care: Secondary | ICD-10-CM

## 2019-08-19 DIAGNOSIS — R4182 Altered mental status, unspecified: Secondary | ICD-10-CM

## 2019-08-19 LAB — MAGNESIUM: Magnesium: 2.7 mg/dL — ABNORMAL HIGH (ref 1.7–2.4)

## 2019-08-19 LAB — CBC
HCT: 29.4 % — ABNORMAL LOW (ref 36.0–46.0)
Hemoglobin: 9.6 g/dL — ABNORMAL LOW (ref 12.0–15.0)
MCH: 27 pg (ref 26.0–34.0)
MCHC: 32.7 g/dL (ref 30.0–36.0)
MCV: 82.8 fL (ref 80.0–100.0)
Platelets: 198 10*3/uL (ref 150–400)
RBC: 3.55 MIL/uL — ABNORMAL LOW (ref 3.87–5.11)
RDW: 17.1 % — ABNORMAL HIGH (ref 11.5–15.5)
WBC: 11.3 10*3/uL — ABNORMAL HIGH (ref 4.0–10.5)
nRBC: 0 % (ref 0.0–0.2)

## 2019-08-19 LAB — HEPATITIS C ANTIBODY: HCV Ab: NONREACTIVE

## 2019-08-19 LAB — RENAL FUNCTION PANEL
Albumin: 3.4 g/dL — ABNORMAL LOW (ref 3.5–5.0)
Anion gap: 21 — ABNORMAL HIGH (ref 5–15)
BUN: 96 mg/dL — ABNORMAL HIGH (ref 8–23)
CO2: 17 mmol/L — ABNORMAL LOW (ref 22–32)
Calcium: 7.7 mg/dL — ABNORMAL LOW (ref 8.9–10.3)
Chloride: 104 mmol/L (ref 98–111)
Creatinine, Ser: 6.78 mg/dL — ABNORMAL HIGH (ref 0.44–1.00)
GFR calc Af Amer: 7 mL/min — ABNORMAL LOW (ref >60)
GFR calc non Af Amer: 6 mL/min — ABNORMAL LOW (ref >60)
Glucose, Bld: 131 mg/dL — ABNORMAL HIGH (ref 70–99)
Phosphorus: 7 mg/dL — ABNORMAL HIGH (ref 2.5–4.6)
Potassium: 4.3 mmol/L (ref 3.5–5.1)
Sodium: 142 mmol/L (ref 135–145)

## 2019-08-19 LAB — RPR: RPR Ser Ql: NONREACTIVE

## 2019-08-19 LAB — AMMONIA: Ammonia: 36 umol/L — ABNORMAL HIGH (ref 9–35)

## 2019-08-19 LAB — GLUCOSE, CAPILLARY
Glucose-Capillary: 120 mg/dL — ABNORMAL HIGH (ref 70–99)
Glucose-Capillary: 127 mg/dL — ABNORMAL HIGH (ref 70–99)
Glucose-Capillary: 129 mg/dL — ABNORMAL HIGH (ref 70–99)
Glucose-Capillary: 86 mg/dL (ref 70–99)
Glucose-Capillary: 94 mg/dL (ref 70–99)
Glucose-Capillary: 94 mg/dL (ref 70–99)

## 2019-08-19 LAB — BRAIN NATRIURETIC PEPTIDE: B Natriuretic Peptide: 2752 pg/mL — ABNORMAL HIGH (ref 0.0–100.0)

## 2019-08-19 LAB — HEPATITIS B SURFACE ANTIGEN: Hepatitis B Surface Ag: NONREACTIVE

## 2019-08-19 LAB — HEPATITIS B CORE ANTIBODY, IGM: Hep B C IgM: NONREACTIVE

## 2019-08-19 LAB — HEPATITIS B SURFACE ANTIBODY,QUALITATIVE: Hep B S Ab: NONREACTIVE

## 2019-08-19 MED ORDER — CLONIDINE HCL 0.2 MG/24HR TD PTWK
0.2000 mg | MEDICATED_PATCH | TRANSDERMAL | Status: DC
  Administered 2019-08-19 – 2019-09-02 (×3): 0.2 mg via TRANSDERMAL
  Filled 2019-08-19 (×4): qty 1

## 2019-08-19 MED ORDER — SODIUM CHLORIDE 0.9 % IV SOLN
2.0000 g | Freq: Every day | INTRAVENOUS | Status: DC
  Administered 2019-08-20: 2 g via INTRAVENOUS
  Filled 2019-08-19: qty 2
  Filled 2019-08-19: qty 20

## 2019-08-19 NOTE — Progress Notes (Signed)
Per family request a transfer to [facility] is in process. Patient son stated that patient recently moved in with him and family and lives in [place]. [place] is too far to transport. Both clinics are aware, waiting for financial approval.

## 2019-08-19 NOTE — Consult Note (Signed)
Consultation Note Date: 08/19/2019   Patient Name: Amber Dixon  DOB: 03/05/1951  MRN: [id number]  Age / Sex: 69 y.o., female  PCP: Patient, No Pcp Per Referring Physician: Mercy Riding, MD  Reason for Consultation: Establishing goals of care and Psychosocial/spiritual support  HPI/Patient Profile: 69 y.o. female   admitted on 08/16/2019 with significant past medical  history of type 2 diabetes mellitus, hypertension and chronic kidney disease/ on dialysis, , who presented to the emergency room with acute onset of altered mental status with confusion and decreased responsiveness with associated elevated blood pressure today.    The patient was seen in [facility] on 4/22 for elevated blood pressure and similar confusion after hemodialysis which has resolved with improved blood pressure.    She had been living with son in [place] but just last week moved to [place] to be with son [name].  Patient has only been in the [place] since October 2020  Upon presentation to the emergency room, blood pressure was 216/111 with temperature of 102.2, heart rate of 120 and respiratory rate of 22.  Labs revealed creatinine of 1.88 and blood glucose 224 with sodium 134 and AST 48 but otherwise unremarkable CMP.  Lactic acid was 2 and later 1.7 and CBC was unremarkable.  COVID-19 PCR and influenza antigens came back negative.  Urinalysis showed more than 300 protein and 250 glucose.  Chest x-ray showed stable cardiomegaly without evidence of acute or active cardiopulmonary   Multiple re hospitalizations in past six months, most recent 08/14/19 at [facility]  Currently minimally responsive and taking very little po intake.   Family face treatment option decisions,advanced directive decisions and anticipatory care needs   Clinical Assessment and Goals of Care:  This NP Wadie Lessen reviewed medical  records, received report from team, assessed the patient and then meet at the patient's bedside along with son/FredericoGutierrez and his wife Mellissa   to discuss diagnosis, prognosis, GOC, EOL wishes disposition and options.  Patient is unable to participate in today's goals of care secondary to altered mental status  Patient's brother Clifton James was notified of the meeting but was not present to participate.  Interpretor was present and utilized  Concept of  Palliative Care was discussed  Educated family on current medical situation specific to her minimal responsiveness, inability to support herself from a nutrition or hydration standpoint and overall failure to thrive.   A detailed discussion was had today regarding advanced directives.  Concepts specific to code status, artifical feeding and hydration, continued IV antibiotics, continuation of dialysis and rehospitalization was had.  The difference between a aggressive medical intervention path  and a palliative comfort care path for this patient at this time was had.  Values and goals of care important to patient and family were attempted to be elicited.  It was evident throughout the meeting that there were many misunderstandings with  the family regarding the patient's health care and treatment plan over the past many months. Family speak to their mistrust of  the dialysis center in [place] and hope to have her moved to [place] dialysis center.  They tell me that the patient had injections in her arm that they were unaware of what the substance was and were told "just take it it is good for you".  There is a disconnect between the medical history and the medical records  Natural trajectory and expectations at EOL were discussed.  Questions and concerns addressed.   Family encouraged to call with questions or concerns.    PMT will continue to support holistically.    No documented HPOA or AD.  Educated and encouraged family to  determine who is the main contact and or decision maker for this patient who does not have medical decisional capapcity  SUMMARY OF RECOMMENDATIONS    Code Status/Advance Care Planning:  DNR  Encouraged patient/family to consider DNR/DNI status understanding evidenced based poor outcomes in similar hospitalized patient, as the cause of arrest is likely associated with advanced chronic illness rather than an easily reversible acute cardio-pulmonary event. Discussed with family the fact that the patient is not taking anything by mouth to support herself nutritionally and the importance of conversation and decision around artificial feeding.    Palliative Prophylaxis:   Aspiration, Bowel Regimen, Delirium Protocol, Frequent Pain Assessment and Oral Care  Additional Recommendations (Limitations, Scope, Preferences):  Full Comfort Care  Psycho-social/Spiritual:   Desire for further Chaplaincy support:yes  Additional Recommendations: Education on Hospice and Grief/Bereavement Support   Another meeting is been set up for Thursday at 12:00 with both sons and both daughter-in-law's.  We are securing a Spanish-speaking interpreter to be present.  Prognosis:   Unable to determine  Discharge Planning: To Be Determined      Primary Diagnoses: Present on Admission: . Encephalopathy . Sepsis ([place])   I have reviewed the medical record, interviewed the patient and family, and examined the patient. The following aspects are pertinent.  Past Medical History:  Diagnosis Date  . Diabetes mellitus without complication ([place])   . Hypertension   . Renal disorder    Social History   Socioeconomic History  . Marital status: Single    Spouse name: Not on file  . Number of children: Not on file  . Years of education: Not on file  . Highest education level: Not on file  Occupational History  . Not on file  Tobacco Use  . Smoking status: Never Smoker  . Smokeless tobacco: Never Used    Substance and Sexual Activity  . Alcohol use: Not on file  . Drug use: Not Currently  . Sexual activity: Not on file  Other Topics Concern  . Not on file  Social History Narrative   ** Merged History Encounter **       Social Determinants of Health   Financial Resource Strain:   . Difficulty of Paying Living Expenses:   Food Insecurity:   . Worried About Charity fundraiser in the Last Year:   . Arboriculturist in the Last Year:   Transportation Needs:   . Film/video editor (Medical):   Marland Kitchen Lack of Transportation (Non-Medical):   Physical Activity:   . Days of Exercise per Week:   . Minutes of Exercise per Session:   Stress:   . Feeling of Stress :   Social Connections:   . Frequency of Communication with Friends and Family:   . Frequency of Social Gatherings with Friends and Family:   . Attends Religious Services:   .  Active Member of Clubs or Organizations:   . Attends Archivist Meetings:   Marland Kitchen Marital Status:    No family history on file. Scheduled Meds: . amLODipine  10 mg Oral Daily  . Chlorhexidine Gluconate Cloth  6 each Topical Daily  . furosemide  80 mg Oral QODAY  . heparin  5,000 Units Subcutaneous Q12H  . insulin aspart  0-6 Units Subcutaneous Q4H  . vancomycin  500 mg Intravenous Q T,Th,Sa-HD   Continuous Infusions: . ceFEPime (MAXIPIME) IV 1 g (08/18/19 0816)  . metronidazole Stopped (08/19/19 0131)   PRN Meds:.acetaminophen **OR** acetaminophen, hydrALAZINE, ipratropium-albuterol, labetalol, magnesium hydroxide, ondansetron **OR** ondansetron (ZOFRAN) IV Medications Prior to Admission:  Prior to Admission medications   Medication Sig Start Date End Date Taking? Authorizing Provider  amLODipine (NORVASC) 10 MG tablet Take 1 tablet (10 mg total) by mouth daily. 06/12/19  Yes Lorella Nimrod, MD  PRESCRIPTION MEDICATION Inject 5 Units into the skin daily. Insulin from [facility] [facility] [facility]). TAKES ON DAYS AFTER DIALYSIS   Yes [provider]  calcium carbonate (TUMS) 500 MG chewable tablet Chew 1 tablet (200 mg of elemental calcium total) by mouth 3 (three) times daily. Patient not taking: Reported on 08/17/2019 03/25/19 03/24/20  Al Decant, MD  calcium-vitamin D (OSCAL WITH D) 500-200 MG-UNIT tablet Take 1 tablet by mouth daily with breakfast.    [provider]  furosemide (LASIX) 80 MG tablet Take 1 tablet (80 mg total) by mouth 2 (two) times daily. Patient taking differently: Take 80 mg by mouth every other day.  03/25/19 04/24/19  Al Decant, MD  ondansetron (ZOFRAN ODT) 4 MG disintegrating tablet Take 1 tablet (4 mg total) by mouth every 8 (eight) hours as needed for nausea or vomiting. Patient not taking: Reported on 08/17/2019 04/25/19   Jean Rosenthal, MD  PRESCRIPTION MEDICATION Inject 6 Units into the skin daily. Medication:Insulin Vial    [provider]   No Known Allergies Review of Systems  Unable to perform ROS: Acuity of condition    Physical Exam Constitutional:      Appearance: She is cachectic. She is ill-appearing.  Pulmonary:     Breath sounds: Decreased breath sounds present.  Musculoskeletal:     Comments: - muscle atrophy generalized   Skin:    General: Skin is cool.  Neurological:     Mental Status: She is lethargic.     Vital Signs: BP (!) 185/78   Pulse 100   Temp (!) 97.3 F (36.3 C) (Axillary)   Resp (!) 22   Ht 4\' 11"  (1.499 m)   Wt 33.6 kg   SpO2 97%   BMI 14.96 kg/m  Pain Scale: Faces   Pain Score: 0-No pain   SpO2: SpO2: 97 % O2 Device:SpO2: 97 % O2 Flow Rate: .O2 Flow Rate (L/min): 2 L/min  IO: Intake/output summary:   Intake/Output Summary (Last 24 hours) at 08/19/2019 1829 Last data filed at 08/19/2019 0200 Gross per 24 hour  Intake 0 ml  Output 0 ml  Net 0 ml    LBM: Last BM Date: 08/18/19 Baseline Weight: Weight: 31.7 kg Most recent weight: Weight: 33.6 kg     Palliative Assessment/Data: 20 %   Discussed with Dr  Cyndia Skeeters  Time In:  1100 Time Out: 1230 Time Total: 90 minutes Greater than 50%  of this time was spent counseling and coordinating care related to the above assessment and plan.  Signed by: Wadie Lessen, NP   Please contact Palliative  Medicine Team phone at [phone] for questions and concerns.  For individual provider: See Shea Evans

## 2019-08-19 NOTE — Progress Notes (Signed)
Pre dialysis assessment 

## 2019-08-19 NOTE — Progress Notes (Signed)
Palliative Medicine RN Note: Left message for interpreter services to set up Spanish interpreter for 1100 PMT meeting.   Marjie Skiff Torben Soloway, RN, BSN, [facility] Palliative Medicine Team 08/19/2019 8:27 AM Office [phone]

## 2019-08-19 NOTE — Progress Notes (Signed)
[facility]  ROUNDING NOTE   Subjective:   Seen and examined on hemodialysis. Patient nonverbal. Opening eyes spontaneously.     HEMODIALYSIS FLOWSHEET:  Blood Flow Rate (mL/min): 300 mL/min Arterial Pressure (mmHg): -170 mmHg Venous Pressure (mmHg): 150 mmHg Transmembrane Pressure (mmHg): 60 mmHg Ultrafiltration Rate (mL/min): 750 mL/min Dialysate Flow Rate (mL/min): 600 ml/min Conductivity: Machine : 13.9 Conductivity: Machine : 13.9 Dialysis Fluid Bolus: Meds Bolus Amount (mL): 100 mL    Objective:  Vital signs in last 24 hours:  Temp:  [97.3 F (36.3 C)-98.7 F (37.1 C)] 98.4 F (36.9 C) (04/27 1210) Pulse Rate:  [89-103] 96 (04/27 1215) Resp:  [15-34] 21 (04/27 1215) BP: (146-201)/(64-93) 196/80 (04/27 1215) SpO2:  [94 %-100 %] 98 % (04/27 1000)  Weight change:  Filed Weights   08/16/19 2023 08/17/19 0624  Weight: 31.7 kg 33.6 kg    Intake/Output: I/O last 3 completed shifts: In: 521.1 [IV Piggyback:521.1] Out: 0    Intake/Output this shift:  Total I/O In: -  Out: 835 [Other:835]  Physical Exam: General: NAD, laying in bed  Head: Normocephalic, atraumatic. Moist oral mucosal membranes  Eyes: Anicteric, PERRL  Neck: Supple, trachea midline  Lungs:  Clear to auscultation  Heart: Regular rate and rhythm  Abdomen:  Soft, nontender  Extremities:  no peripheral edema.  Neurologic: lethargic  Skin: No lesions  Access: Right AVF    Basic Metabolic Panel: Recent Labs  Lab 08/14/19 1831 08/14/19 1831 08/16/19 2029 08/17/19 0243 08/17/19 0656 08/19/19 0406  NA 130*  --  134* 134*  --  142  K 3.5  --  3.8 3.9  --  4.3  CL 92*  --  92* 93*  --  104  CO2 26  --  32 29  --  17*  GLUCOSE 236*  --  224* 260*  --  131*  BUN 9  --  14 24*  --  96*  CREATININE 1.71*  --  1.88* 2.60*  --  6.78*  CALCIUM 7.9*   < > 8.6* 8.1*  --  7.7*  MG  --   --   --   --  2.3 2.7*  PHOS  --   --   --   --  3.9 7.0*   < > = values in this interval not  displayed.    Liver Function Tests: Recent Labs  Lab 08/14/19 1831 08/16/19 2029 08/19/19 0406  AST 39 48*  --   ALT 28 31  --   ALKPHOS 161* 155*  --   BILITOT 0.5 0.4  --   PROT 6.9 7.8  --   ALBUMIN 3.5 4.2 3.4*   No results for input(s): LIPASE, AMYLASE in the last 168 hours. Recent Labs  Lab 08/17/19 0243 08/18/19 0448 08/19/19 0406  AMMONIA 16 30 36*    CBC: Recent Labs  Lab 08/14/19 1831 08/16/19 2029 08/17/19 0243 08/19/19 0406  WBC 5.2 6.4 6.6 11.3*  NEUTROABS 4.1 5.1  --   --   HGB 12.3 12.1 10.2* 9.6*  HCT 40.0 38.0 31.8* 29.4*  MCV 85.8 82.4 82.2 82.8  PLT 168 215 176 198    Cardiac Enzymes: No results for input(s): CKTOTAL, CKMB, CKMBINDEX, TROPONINI in the last 168 hours.  BNP: Invalid input(s): POCBNP  CBG: Recent Labs  Lab 08/18/19 1609 08/18/19 2039 08/19/19 0001 08/19/19 0407 08/19/19 0719  GLUCAP 117* 138* 127* 129* 120*    Microbiology: Results for orders placed or performed during the hospital  encounter of 08/16/19  Urine culture     Status: None   Collection Time: 08/16/19  8:23 PM   Specimen: In/Out Cath Urine  Result Value Ref Range Status   Specimen Description   Final    IN/OUT CATH URINE Performed at [facility], [address] [place], [place] [address]    Special Requests   Final    NONE Performed at [facility], [address] [place], [place] [address]    Culture   Final    NO GROWTH Performed at [facility], [place] [address], [place], [place] [address]    Report Status 08/18/2019 FINAL  Final  Blood Culture (routine x 2)     Status: None (Preliminary result)   Collection Time: 08/16/19  8:29 PM   Specimen: BLOOD  Result Value Ref Range Status   Specimen Description BLOOD BLOOD LEFT FOREARM  Final   Special Requests   Final    BOTTLES DRAWN AEROBIC AND ANAEROBIC Blood Culture adequate volume   Culture   Final    NO GROWTH 3 DAYS Performed at [facility], [address] [place], [place] [address]    Report Status PENDING  Incomplete  Blood Culture (routine x 2)     Status: None (Preliminary result)   Collection Time: 08/16/19  8:29 PM   Specimen: BLOOD  Result Value Ref Range Status   Specimen Description BLOOD BLOOD LEFT ARM  Final   Special Requests   Final    BOTTLES DRAWN AEROBIC AND ANAEROBIC Blood Culture results may not be optimal due to an inadequate volume of blood received in culture bottles   Culture   Final    NO GROWTH 3 DAYS Performed at [facility], [address], [place], [place] [address]    Report Status PENDING  Incomplete  Respiratory Panel by RT PCR (Flu A&B, Covid) - Urine, Clean Catch     Status: None   Collection Time: 08/16/19  8:47 PM   Specimen: Urine, Clean Catch  Result Value Ref Range Status   SARS Coronavirus 2 by RT PCR NEGATIVE NEGATIVE Final    Comment: (NOTE) SARS-CoV-2 target nucleic acids are NOT DETECTED. The SARS-CoV-2 RNA is generally detectable in upper respiratoy specimens during the acute phase of infection. The lowest concentration of SARS-CoV-2 viral copies this assay can detect is 131 copies/mL. A negative result does not preclude SARS-Cov-2 infection and should not be used as the sole basis for treatment or other patient management decisions. A negative result may occur with  improper specimen collection/handling, submission of specimen other than nasopharyngeal swab, presence of viral mutation(s) within the areas targeted by this assay, and inadequate number of viral copies (<131 copies/mL). A negative result must be combined with clinical observations, patient history, and epidemiological information. The expected result is Negative. Fact Sheet for Patients:  PinkCheek.be Fact Sheet for Healthcare Providers:  GravelBags.it This test is not yet ap proved or cleared by the [place] FDA and  has been authorized  for detection and/or diagnosis of SARS-CoV-2 by FDA under an Emergency Use Authorization (EUA). This EUA will remain  in effect (meaning this test can be used) for the duration of the COVID-19 declaration under Section 564(b)(1) of the Act, 21 U.S.C. section 360bbb-3(b)(1), unless the authorization is terminated or revoked sooner.    Influenza A by PCR NEGATIVE NEGATIVE Final   Influenza B by PCR NEGATIVE NEGATIVE Final    Comment: (NOTE) The Xpert  Xpress SARS-CoV-2/FLU/RSV assay is intended as an aid in  the diagnosis of influenza from Nasopharyngeal swab specimens and  should not be used as a sole basis for treatment. Nasal washings and  aspirates are unacceptable for Xpert Xpress SARS-CoV-2/FLU/RSV  testing. Fact Sheet for Patients: PinkCheek.be Fact Sheet for Healthcare Providers: GravelBags.it This test is not yet approved or cleared by the [place] FDA and  has been authorized for detection and/or diagnosis of SARS-CoV-2 by  FDA under an Emergency Use Authorization (EUA). This EUA will remain  in effect (meaning this test can be used) for the duration of the  Covid-19 declaration under Section 564(b)(1) of the Act, 21  U.S.C. section 360bbb-3(b)(1), unless the authorization is  terminated or revoked. Performed at [facility], [address] [place], [place] [address]     Coagulation Studies: Recent Labs    08/16/19 August 06, 2027  LABPROT 12.1  INR 0.9    Urinalysis: Recent Labs    08/16/19 2023  COLORURINE YELLOW  LABSPEC 1.020  PHURINE 8.5*  GLUCOSEU 250*  HGBUR TRACE*  BILIRUBINUR NEGATIVE  KETONESUR NEGATIVE  PROTEINUR >300*  NITRITE NEGATIVE  LEUKOCYTESUR NEGATIVE      Imaging: No results found.   Medications:   . ceFEPime (MAXIPIME) IV 1 g (08/18/19 0816)  . metronidazole 500 mg (08/19/19 1303)   . amLODipine  10 mg Oral Daily  . Chlorhexidine Gluconate Cloth  6 each Topical  Daily  . furosemide  80 mg Oral QODAY  . heparin  5,000 Units Subcutaneous Q12H  . insulin aspart  0-6 Units Subcutaneous Q4H  . vancomycin  500 mg Intravenous Q T,Th,Sa-HD   acetaminophen **OR** acetaminophen, hydrALAZINE, ipratropium-albuterol, labetalol, magnesium hydroxide, ondansetron **OR** ondansetron (ZOFRAN) IV  Assessment/ Plan:  Amber Dixon is a 69 y.o. Hispanic female with end stage renal disease on hemodialysis, diabetes mellitus, hypertension, who was admitted 08/16/2019 for Encephalopathy [G93.40] Sepsis ([place]) [A41.9] Fever, unspecified fever cause [R50.9] Altered mental status, unspecified altered mental status type [R41.82]  [facility] ([place]) [facility] SW Short TTS Right AVF  1. End stage renal disease: seen and examined on hemodialysis treatment.  16 ga needles.   2. Hypertension: Home regimen of amlodipine and furosemide. PRN hydralazine - Continue PO agents   3. Anemia of chronic kidney disease: hemoglobin 9.6 - EPO with HD treatment.   4. Secondary Hyperparathyroidism: calcium and phosphorus at goal. Not currently on binders.   5. Sepsis: blood cultures from 4/24 with no growth. Empiric metronidazole, vanco and cefepime.    LOS: 3 Remmy Riffe 4/27/20211:21 PM

## 2019-08-19 NOTE — Progress Notes (Signed)
PROGRESS NOTE  Amber Dixon JEH:[id number] DOB: 1950/10/18   PCP: Patient, No Pcp Per  Patient is from: home  DOA: 08/16/2019 LOS: 3  Brief Narrative / Interim history: 69 year old female with history of ESRD on HD TTS, DM-2, HTN, severe malnutrition, debility and language barrier presented with fever, altered mental status and elevated blood pressure.   In ED, hypotensive with SBP in 200s. Febrile to 102.2. Tachycardic to 130s. 97% on RA. Na 130. LA 2.0. CBC normal. BNP elevated to 3300 (previously 300). COVID-19 and influenza PCR negative. CT head without contrast without acute finding. CXR with improved heterogeneous opacity. UA with >300 protein. EKG without acute finding. Cultures obtained. Started on broad-spectrum antibiotics. Neurology and nephrology consulted.  Neurology does not feel MRI is indicated. Suggested palliative care consult. Thyroid panel, ammonia, urine and blood cultures nonrevealing so far. Palliative care consulted and met with patient's family.  Plan is full scope of care.  Palliative to meet with patient's family again on 4/29.  Subjective: Seen and examined earlier this afternoon.  No major events overnight or this morning.  Remains somnolent.  Barely responds to noxious stimuli.  Remains hypotensive with systolic blood pressure as high as 200s.  Objective: Vitals:   08/19/19 1300 08/19/19 1330 08/19/19 1400 08/19/19 1430  BP: (!) 199/79 (!) 188/83 (!) 193/76 (!) 185/76  Pulse: 95 96 97 97  Resp: 20 (!) 39 19 19  Temp:      TempSrc:      SpO2: 98% 97% 98% 97%  Weight:      Height:        Intake/Output Summary (Last 24 hours) at 08/19/2019 1609 Last data filed at 08/19/2019 1210 Gross per 24 hour  Intake 0 ml  Output 835 ml  Net -835 ml   Filed Weights   08/16/19 2023 08/17/19 0624  Weight: 31.7 kg 33.6 kg    Examination:  GENERAL: Frail and chronically ill-appearing.  Somnolent. NECK: Supple.  No apparent JVD.  RESP: 97% on 2  L by [place].  No IWOB.  Fair aeration bilaterally. CVS:  RRR. Heart sounds normal.  ABD/GI/GU: Bowel sounds present. Soft. Non tender.  MSK/EXT: Global muscle mass and subcu fat loss. SKIN: no apparent skin lesion or wound NEURO: Somnolent.  Barely responds to noxious stimuli.  PERRL. PSYCH: No agitation no signs of distress.  Procedures:  None  Microbiology summarized: [id number] and influenza PCR negative. Urine culture negative. Blood culture negative.  Assessment & Plan: SIRS: Patient with fever, tachycardia and altered mental status but no leukocytosis or clear source of infection so far. She has HD cath but blood work negative.  Last objective fever on 4/25.  Tachycardia resolving. -De-escalate antibiotics to ceftriaxone  Acute metabolic encephalopathy: Work-up including CT head without contrast, TFT, ammonia and cultures unrevealing. Remains very somnolent. Barely responds to noxious stimuli. No focal neuro symptoms but difficult exam due to mental status. PRES syndrome? -Appreciate neurology insight -Obtain MRI brain -Optimize blood pressure control-added clonidine patch 0.2 mg  Hypertensive emergency: Presented with SBP in 200s, fluid overload and encephalopathy. BNP elevated to 3000. BP improving. -Started clonidine patch as above. -Continue as needed labetalol and hydralazine -Continue Lasix and HD per nephrology  ESRD/bone marrow disorder: On HD TTS. -Nephrology managing.  Anemia of chronic disease: H&H stable. -Continue monitoring  Severe malnutrition:Body mass index is 14.96 kg/m. Patient with global muscle mass and subcu fat loss. Has been n.p.o. since admission due to encephalopathy.  Discussed with patient's daughter-in-law about  the need for eternal nutrition unless we are pursuing comfort measures.  She will talk to patient's son and call us back about NGT. -Waiting on family to call back about NGT insertion.  Goal of care discussion: Patient with comorbidities as  above. Remains encephalopathic over the last 3 days. Severely malnourished. She is a still full code which I believe will pose more harm than benefit.  -Palliative care consulted and met with family on 4/27.  She remains full code with full scope of care. Palliative to meet with patient's family again on 4/29.                  DVT prophylaxis: subq heparin Code Status: full code Family Communication: Updated patients daughter-in-law, Lenna Sciara over the phone.  Discharge barrier: Encephalopathy, hypertensive emergency Patient is from: home Final disposition: to be determined  Consultants:  Neurology Nephrology PMT   Sch Meds:  Scheduled Meds: . amLODipine  10 mg Oral Daily  . Chlorhexidine Gluconate Cloth  6 each Topical Daily  . cloNIDine  0.2 mg Transdermal Weekly  . furosemide  80 mg Oral QODAY  . heparin  5,000 Units Subcutaneous Q12H  . insulin aspart  0-6 Units Subcutaneous Q4H   Continuous Infusions: . cefTRIAXone (ROCEPHIN)  IV     PRN Meds:.acetaminophen **OR** acetaminophen, hydrALAZINE, ipratropium-albuterol, labetalol, magnesium hydroxide, ondansetron **OR** ondansetron (ZOFRAN) IV  Antimicrobials: Anti-infectives (From admission, onward)   Start     Dose/Rate Route Frequency Ordered Stop   08/19/19 1615  cefTRIAXone (ROCEPHIN) 2 g in sodium chloride 0.9 % 100 mL IVPB     2 g 200 mL/hr over 30 Minutes Intravenous Every 24 hours 08/19/19 1609     08/19/19 1200  vancomycin (VANCOCIN) IVPB 500 mg/100 ml premix  Status:  Discontinued     500 mg 100 mL/hr over 60 Minutes Intravenous Every T-Th-Sa (Hemodialysis) 08/17/19 1500 08/19/19 1609   08/17/19 0900  ceFEPIme (MAXIPIME) 2 g in sodium chloride 0.9 % 100 mL IVPB  Status:  Discontinued     2 g 200 mL/hr over 30 Minutes Intravenous Every 24 hours 08/17/19 0045 08/17/19 0244   08/17/19 0900  ceFEPIme (MAXIPIME) 1 g in sodium chloride 0.9 % 100 mL IVPB  Status:  Discontinued     1 g 200 mL/hr over 30 Minutes  Intravenous Every 24 hours 08/17/19 0244 08/19/19 1609   08/17/19 0246  vancomycin variable dose per unstable renal function (pharmacist dosing)  Status:  Discontinued      Does not apply See admin instructions 08/17/19 0246 08/17/19 1503   08/17/19 0045  ceFEPIme (MAXIPIME) 2 g in sodium chloride 0.9 % 100 mL IVPB  Status:  Discontinued     2 g 200 mL/hr over 30 Minutes Intravenous  Once 08/17/19 0036 08/17/19 0045   08/17/19 0045  metroNIDAZOLE (FLAGYL) IVPB 500 mg  Status:  Discontinued     500 mg 100 mL/hr over 60 Minutes Intravenous Every 8 hours 08/17/19 0036 08/19/19 1609   08/17/19 0045  vancomycin (VANCOCIN) IVPB 1000 mg/200 mL premix  Status:  Discontinued     1,000 mg 200 mL/hr over 60 Minutes Intravenous  Once 08/17/19 0036 08/17/19 0044   08/16/19 2045  vancomycin (VANCOCIN) IVPB 1000 mg/200 mL premix     1,000 mg 200 mL/hr over 60 Minutes Intravenous  Once 08/16/19 2032 08/16/19 2254   08/16/19 2045  piperacillin-tazobactam (ZOSYN) IVPB 3.375 g     3.375 g 100 mL/hr over 30 Minutes Intravenous  Once 08/16/19  2032 08/16/19 2127       I have personally reviewed the following labs and images: CBC: Recent Labs  Lab 08/14/19 1831 08/16/19 2029 08/17/19 0243 08/19/19 0406  WBC 5.2 6.4 6.6 11.3*  NEUTROABS 4.1 5.1  --   --   HGB 12.3 12.1 10.2* 9.6*  HCT 40.0 38.0 31.8* 29.4*  MCV 85.8 82.4 82.2 82.8  PLT 168 215 176 198   BMP &GFR Recent Labs  Lab 08/14/19 1831 08/16/19 2029 08/17/19 0243 08/17/19 0656 08/19/19 0406  NA 130* 134* 134*  --  142  K 3.5 3.8 3.9  --  4.3  CL 92* 92* 93*  --  104  CO2 26 32 29  --  17*  GLUCOSE 236* 224* 260*  --  131*  BUN 9 14 24*  --  96*  CREATININE 1.71* 1.88* 2.60*  --  6.78*  CALCIUM 7.9* 8.6* 8.1*  --  7.7*  MG  --   --   --  2.3 2.7*  PHOS  --   --   --  3.9 7.0*   Estimated Creatinine Clearance: 4.1 mL/min (A) (by C-G formula based on SCr of 6.78 mg/dL (H)). Liver & Pancreas: Recent Labs  Lab 08/14/19 1831  08/16/19 2029 08/19/19 0406  AST 39 48*  --   ALT 28 31  --   ALKPHOS 161* 155*  --   BILITOT 0.5 0.4  --   PROT 6.9 7.8  --   ALBUMIN 3.5 4.2 3.4*   No results for input(s): LIPASE, AMYLASE in the last 168 hours. Recent Labs  Lab 08/14/19 1831 08/17/19 0243 08/18/19 0448 08/19/19 0406  AMMONIA 42* 16 30 36*   Diabetic: Recent Labs    08/17/19 0656  HGBA1C 5.1   Recent Labs  Lab 08/18/19 1609 08/18/19 2039 08/19/19 0001 08/19/19 0407 08/19/19 0719  GLUCAP 117* 138* 127* 129* 120*   Cardiac Enzymes: No results for input(s): CKTOTAL, CKMB, CKMBINDEX, TROPONINI in the last 168 hours. No results for input(s): PROBNP in the last 8760 hours. Coagulation Profile: Recent Labs  Lab 08/16/19 2029  INR 0.9   Thyroid Function Tests: Recent Labs    08/17/19 0656  TSH 3.362  FREET4 1.10   Lipid Profile: No results for input(s): CHOL, HDL, LDLCALC, TRIG, CHOLHDL, LDLDIRECT in the last 72 hours. Anemia Panel: No results for input(s): VITAMINB12, FOLATE, FERRITIN, TIBC, IRON, RETICCTPCT in the last 72 hours. Urine analysis:    Component Value Date/Time   COLORURINE YELLOW 08/16/2019 2023   APPEARANCEUR CLEAR 08/16/2019 2023   LABSPEC 1.020 08/16/2019 2023   PHURINE 8.5 (H) 08/16/2019 2023   GLUCOSEU 250 (A) 08/16/2019 2023   HGBUR TRACE (A) 08/16/2019 2023   [place] NEGATIVE 08/16/2019 2023   KETONESUR NEGATIVE 08/16/2019 2023   PROTEINUR >300 (A) 08/16/2019 2023   NITRITE NEGATIVE 08/16/2019 2023   LEUKOCYTESUR NEGATIVE 08/16/2019 2023   Sepsis Labs: Invalid input(s): PROCALCITONIN, [place]  Microbiology: Recent Results (from the past 240 hour(s))  Urine culture     Status: None   Collection Time: 08/16/19  8:23 PM   Specimen: In/Out Cath Urine  Result Value Ref Range Status   Specimen Description   Final    IN/OUT CATH URINE Performed at [facility], [address], [place], [place] [address]    Special Requests   Final     NONE Performed at [facility], [address], [place], [place] [address]    Culture   Final  NO GROWTH Performed at [facility], [place] [address], [place], [place] [address]    Report Status 08/18/2019 FINAL  Final  Blood Culture (routine x 2)     Status: None (Preliminary result)   Collection Time: 08/16/19  8:29 PM   Specimen: BLOOD  Result Value Ref Range Status   Specimen Description BLOOD BLOOD LEFT FOREARM  Final   Special Requests   Final    BOTTLES DRAWN AEROBIC AND ANAEROBIC Blood Culture adequate volume   Culture   Final    NO GROWTH 3 DAYS Performed at [facility], [address] [place], [place] [address]    Report Status PENDING  Incomplete  Blood Culture (routine x 2)     Status: None (Preliminary result)   Collection Time: 08/16/19  8:29 PM   Specimen: BLOOD  Result Value Ref Range Status   Specimen Description BLOOD BLOOD LEFT ARM  Final   Special Requests   Final    BOTTLES DRAWN AEROBIC AND ANAEROBIC Blood Culture results may not be optimal due to an inadequate volume of blood received in culture bottles   Culture   Final    NO GROWTH 3 DAYS Performed at [facility], [address] [place], [place] [address]    Report Status PENDING  Incomplete  Respiratory Panel by RT PCR (Flu A&B, Covid) - Urine, Clean Catch     Status: None   Collection Time: 08/16/19  8:47 PM   Specimen: Urine, Clean Catch  Result Value Ref Range Status   SARS Coronavirus 2 by RT PCR NEGATIVE NEGATIVE Final    Comment: (NOTE) SARS-CoV-2 target nucleic acids are NOT DETECTED. The SARS-CoV-2 RNA is generally detectable in upper respiratoy specimens during the acute phase of infection. The lowest concentration of SARS-CoV-2 viral copies this assay can detect is 131 copies/mL. A negative result does not preclude SARS-Cov-2 infection and should not be used as the sole basis for treatment or other patient management decisions. A negative result may  occur with  improper specimen collection/handling, submission of specimen other than nasopharyngeal swab, presence of viral mutation(s) within the areas targeted by this assay, and inadequate number of viral copies (<131 copies/mL). A negative result must be combined with clinical observations, patient history, and epidemiological information. The expected result is Negative. Fact Sheet for Patients:  PinkCheek.be Fact Sheet for Healthcare Providers:  GravelBags.it This test is not yet ap proved or cleared by the [place] FDA and  has been authorized for detection and/or diagnosis of SARS-CoV-2 by FDA under an Emergency Use Authorization (EUA). This EUA will remain  in effect (meaning this test can be used) for the duration of the COVID-19 declaration under Section 564(b)(1) of the Act, 21 U.S.C. section 360bbb-3(b)(1), unless the authorization is terminated or revoked sooner.    Influenza A by PCR NEGATIVE NEGATIVE Final   Influenza B by PCR NEGATIVE NEGATIVE Final    Comment: (NOTE) The Xpert Xpress SARS-CoV-2/FLU/RSV assay is intended as an aid in  the diagnosis of influenza from Nasopharyngeal swab specimens and  should not be used as a sole basis for treatment. Nasal washings and  aspirates are unacceptable for Xpert Xpress SARS-CoV-2/FLU/RSV  testing. Fact Sheet for Patients: PinkCheek.be Fact Sheet for Healthcare Providers: GravelBags.it This test is not yet approved or cleared by the [place] FDA and  has been authorized for detection and/or diagnosis of SARS-CoV-2 by  FDA under an Emergency Use Authorization (EUA). This EUA will remain  in effect (meaning this test can be used) for the duration of the  Covid-19 declaration under Section 564(b)(1) of the Act, 21  U.S.C. section 360bbb-3(b)(1), unless the authorization is  terminated or  revoked. Performed at [facility], [address] [place], [place] [address]     Radiology Studies: No results found.    Amber Dixon T. [place]  If 7PM-7AM, please contact night-coverage [email or web address] Password [facility] 08/19/2019, 4:09 PM

## 2019-08-19 NOTE — Progress Notes (Signed)
Hd started  

## 2019-08-19 NOTE — Progress Notes (Signed)
PT Cancellation Note  Patient Details Name: Amber Dixon MRN: [id number] DOB: 11-14-1950   Cancelled Treatment:    Reason Eval/Treat Not Completed: Medical issues which prohibited therapy;Patient at procedure or test/unavailable(Pt off floor for HD at this time. Will continue to follow and resume services at later date/time. PMT meeting at 11:00 pending.)  10:51 AM, 08/19/19 Etta Grandchild, PT, DPT Physical Therapist - [facility]  [phone] ([place])    [name] 08/19/2019, 10:51 AM

## 2019-08-19 NOTE — Progress Notes (Signed)
This note also relates to the following rows which could not be included: Pulse Rate - Cannot attach notes to unvalidated device data Resp - Cannot attach notes to unvalidated device data  Hd completed  

## 2019-08-20 ENCOUNTER — Encounter: Payer: Self-pay | Admitting: Family Medicine

## 2019-08-20 DIAGNOSIS — Z515 Encounter for palliative care: Secondary | ICD-10-CM

## 2019-08-20 DIAGNOSIS — Z7189 Other specified counseling: Secondary | ICD-10-CM

## 2019-08-20 DIAGNOSIS — R4182 Altered mental status, unspecified: Secondary | ICD-10-CM

## 2019-08-20 LAB — GLUCOSE, CAPILLARY
Glucose-Capillary: 102 mg/dL — ABNORMAL HIGH (ref 70–99)
Glucose-Capillary: 107 mg/dL — ABNORMAL HIGH (ref 70–99)
Glucose-Capillary: 107 mg/dL — ABNORMAL HIGH (ref 70–99)
Glucose-Capillary: 136 mg/dL — ABNORMAL HIGH (ref 70–99)
Glucose-Capillary: 138 mg/dL — ABNORMAL HIGH (ref 70–99)
Glucose-Capillary: 86 mg/dL (ref 70–99)
Glucose-Capillary: 97 mg/dL (ref 70–99)

## 2019-08-20 LAB — PHOSPHORUS: Phosphorus: 7 mg/dL — ABNORMAL HIGH (ref 2.5–4.6)

## 2019-08-20 LAB — ROCKY MTN SPOTTED FVR ABS PNL(IGG+IGM)
RMSF IgG: NEGATIVE
RMSF IgM: 0.37 index (ref 0.00–0.89)

## 2019-08-20 LAB — MAGNESIUM: Magnesium: 2.3 mg/dL (ref 1.7–2.4)

## 2019-08-20 LAB — AMMONIA: Ammonia: 25 umol/L (ref 9–35)

## 2019-08-20 MED ORDER — JEVITY 1.5 CAL/FIBER PO LIQD
1000.0000 mL | ORAL | Status: DC
  Administered 2019-08-20 – 2019-08-25 (×6): 1000 mL via ORAL

## 2019-08-20 MED ORDER — VITAL HIGH PROTEIN PO LIQD: 1000.0000 mL | ORAL | Status: DC

## 2019-08-20 MED ORDER — B COMPLEX-C PO TABS
1.0000 | ORAL_TABLET | Freq: Every day | ORAL | Status: DC
  Administered 2019-08-20 – 2019-08-25 (×6): 1
  Filled 2019-08-20 (×6): qty 1

## 2019-08-20 NOTE — Progress Notes (Addendum)
Progress Note    Ameilia Rattan  RWE:[id number] DOB: 17-Dec-1950  DOA: 08/16/2019 PCP: Patient, No Pcp Per      Brief Narrative:    Medical records reviewed and are as summarized below:        Assessment/Plan:   Active Problems:   Encephalopathy   Altered mental status   DNR (do not resuscitate) discussion   Palliative care by specialist   SIRS: Patient with fever, tachycardia and altered mental status but no leukocytosis or clear source of infection so far.  Antibiotics have been discontinued  Acute metabolic encephalopathy: Work-up including CT head, MRI brain, TFT, ammonia and cultures unrevealing. Remains very somnolent.  -Appreciate neurology insight  Hypertensive emergency: Presented with SBP in 200s, fluid overload and encephalopathy. BNP elevated to 3000. BP improving. Continue antihypertensives  ESRD/bone marrow disorder: On HD TTS. Follow-up with nephrologist  Anemia of chronic disease: H&H stable. Monitor H&H  Underweight/severe protein calorie malnutrition:Body mass index is 14.96 kg/m. Patient with global muscle mass and subcu fat loss. Has been n.p.o. since admission due to encephalopathy.  Family is yet to make a decision regarding NG tube placement for enteral nutrition  Body mass index is 14.96 kg/m.  (Underweight)  Family Communication/Anticipated D/C date and plan/Code Status   DVT prophylaxis: Heparin Code Status: Full code Family Communication: None Disposition Plan:    Status is: Inpatient  Remains inpatient appropriate because:Inpatient level of care appropriate due to severity of illness   Dispo: The patient is from: Home              Anticipated d/c is to: SNF              Anticipated d/c date is: 3 days              Patient currently is not medically stable to d/c.            Subjective:   Patient is minimally responsive and unable to provide any history  Objective:    Vitals:   08/20/19 1600 08/20/19 1900 08/20/19 2000 08/20/19 2100  BP: 131/62 (!) 145/65 (!) 160/62 (!) 151/65  Pulse: 97 99 98 93  Resp: 17 20 18 18   Temp:  98.4 F (36.9 C)    TempSrc:  Oral    SpO2: 97% 98% 97% 100%  Weight:      Height:       No data found.   Intake/Output Summary (Last 24 hours) at 08/20/2019 2216 Last data filed at 08/20/2019 2000 Gross per 24 hour  Intake 80 ml  Output 0 ml  Net 80 ml   Filed Weights   08/16/19 2023 08/17/19 0624  Weight: 31.7 kg 33.6 kg    Exam:  GEN: NAD SKIN: Warm and dry EYES: No pallor or icterus ENT: MMM CV: RRR PULM: CTA B ABD: soft, ND, NT, +BS CNS: Lethargic and responsive to painful stimuli EXT: No edema    Data Reviewed:   I have personally reviewed following labs and imaging studies:  Labs: Labs show the following:   Basic Metabolic Panel: Recent Labs  Lab 08/14/19 1831 08/14/19 1831 08/16/19 2029 08/16/19 2029 08/17/19 0243 08/17/19 0656 08/19/19 0406 08/20/19 1415  NA 130*  --  134*  --  134*  --  142  --   K 3.5   < > 3.8   < > 3.9  --  4.3  --   CL 92*  --  92*  --  93*  --  104  --   CO2 26  --  32  --  29  --  17*  --   GLUCOSE 236*  --  224*  --  260*  --  131*  --   BUN 9  --  14  --  24*  --  96*  --   CREATININE 1.71*  --  1.88*  --  2.60*  --  6.78*  --   CALCIUM 7.9*  --  8.6*  --  8.1*  --  7.7*  --   MG  --   --   --   --   --  2.3 2.7* 2.3  PHOS  --   --   --   --   --  3.9 7.0* 7.0*   < > = values in this interval not displayed.   GFR Estimated Creatinine Clearance: 4.1 mL/min (A) (by C-G formula based on SCr of 6.78 mg/dL (H)). Liver Function Tests: Recent Labs  Lab 08/14/19 1831 08/16/19 2029 08/19/19 0406  AST 39 48*  --   ALT 28 31  --   ALKPHOS 161* 155*  --   BILITOT 0.5 0.4  --   PROT 6.9 7.8  --   ALBUMIN 3.5 4.2 3.4*   No results for input(s): LIPASE, AMYLASE in the last 168 hours. Recent Labs  Lab 08/14/19 1831 08/17/19 0243 08/18/19 0448 08/19/19 0406  08/20/19 0444  AMMONIA 42* 16 30 36* 25   Coagulation profile Recent Labs  Lab 08/16/19 2029  INR 0.9    CBC: Recent Labs  Lab 08/14/19 1831 08/16/19 2029 08/17/19 0243 08/19/19 0406  WBC 5.2 6.4 6.6 11.3*  NEUTROABS 4.1 5.1  --   --   HGB 12.3 12.1 10.2* 9.6*  HCT 40.0 38.0 31.8* 29.4*  MCV 85.8 82.4 82.2 82.8  PLT 168 215 176 198   Cardiac Enzymes: No results for input(s): CKTOTAL, CKMB, CKMBINDEX, TROPONINI in the last 168 hours. BNP (last 3 results) No results for input(s): PROBNP in the last 8760 hours. CBG: Recent Labs  Lab 08/20/19 0401 08/20/19 0746 08/20/19 1134 08/20/19 1633 08/20/19 1953  GLUCAP 97 102* 107* 136* 107*   D-Dimer: No results for input(s): DDIMER in the last 72 hours. Hgb A1c: No results for input(s): HGBA1C in the last 72 hours. Lipid Profile: No results for input(s): CHOL, HDL, LDLCALC, TRIG, CHOLHDL, LDLDIRECT in the last 72 hours. Thyroid function studies: No results for input(s): TSH, T4TOTAL, T3FREE, THYROIDAB in the last 72 hours.  Invalid input(s): FREET3 Anemia work up: No results for input(s): VITAMINB12, FOLATE, FERRITIN, TIBC, IRON, RETICCTPCT in the last 72 hours. Sepsis Labs: Recent Labs  Lab 08/14/19 1831 08/16/19 2029 08/16/19 2255 08/17/19 0243 08/19/19 0406  WBC 5.2 6.4  --  6.6 11.3*  LATICACIDVEN  --  2.0* 1.7  --   --     Microbiology Recent Results (from the past 240 hour(s))  Urine culture     Status: None   Collection Time: 08/16/19  8:23 PM   Specimen: In/Out Cath Urine  Result Value Ref Range Status   Specimen Description   Final    IN/OUT CATH URINE Performed at [facility], [address] [place], [place] [address]    Special Requests   Final    NONE Performed at [facility], [address] [place],  [address]    Culture   Final    NO GROWTH Performed at [facility], [place] [address], [place], [place] [address]    Report Status 08/18/2019 FINAL   Final  Blood Culture (routine x 2)     Status: None (Preliminary result)   Collection Time: 08/16/19  8:29 PM   Specimen: BLOOD  Result Value Ref Range Status   Specimen Description BLOOD BLOOD LEFT FOREARM  Final   Special Requests   Final    BOTTLES DRAWN AEROBIC AND ANAEROBIC Blood Culture adequate volume   Culture   Final    NO GROWTH 4 DAYS Performed at [facility], [address] [place], [place] [address]    Report Status PENDING  Incomplete  Blood Culture (routine x 2)     Status: None (Preliminary result)   Collection Time: 08/16/19  8:29 PM   Specimen: BLOOD  Result Value Ref Range Status   Specimen Description BLOOD BLOOD LEFT ARM  Final   Special Requests   Final    BOTTLES DRAWN AEROBIC AND ANAEROBIC Blood Culture results may not be optimal due to an inadequate volume of blood received in culture bottles   Culture   Final    NO GROWTH 4 DAYS Performed at [facility], [address] [employer] [place], [place] [address]    Report Status PENDING  Incomplete  Respiratory Panel by RT PCR (Flu A&B, Covid) - Urine, Clean Catch     Status: None   Collection Time: 08/16/19  8:47 PM   Specimen: Urine, Clean Catch  Result Value Ref Range Status   SARS Coronavirus 2 by RT PCR NEGATIVE NEGATIVE Final    Comment: (NOTE) SARS-CoV-2 target nucleic acids are NOT DETECTED. The SARS-CoV-2 RNA is generally detectable in upper respiratoy specimens during the acute phase of infection. The lowest concentration of SARS-CoV-2 viral copies this assay can detect is 131 copies/mL. A negative result does not preclude SARS-Cov-2 infection and should not be used as the sole basis for treatment or other patient management decisions. A negative result may occur with  improper specimen collection/handling, submission of specimen other than nasopharyngeal swab, presence of viral mutation(s) within the areas targeted by this assay, and inadequate number of viral copies (<131  copies/mL). A negative result must be combined with clinical observations, patient history, and epidemiological information. The expected result is Negative. Fact Sheet for Patients:  PinkCheek.be Fact Sheet for Healthcare Providers:  GravelBags.it This test is not yet ap proved or cleared by the [place] FDA and  has been authorized for detection and/or diagnosis of SARS-CoV-2 by FDA under an Emergency Use Authorization (EUA). This EUA will remain  in effect (meaning this test can be used) for the duration of the COVID-19 declaration under Section 564(b)(1) of the Act, 21 U.S.C. section 360bbb-3(b)(1), unless the authorization is terminated or revoked sooner.    Influenza A by PCR NEGATIVE NEGATIVE Final   Influenza B by PCR NEGATIVE NEGATIVE Final    Comment: (NOTE) The Xpert Xpress SARS-CoV-2/FLU/RSV assay is intended as an aid in  the diagnosis of influenza from Nasopharyngeal swab specimens and  should not be used as a sole basis for treatment. Nasal washings and  aspirates are unacceptable for Xpert Xpress SARS-CoV-2/FLU/RSV  testing. Fact Sheet for Patients: PinkCheek.be Fact Sheet for Healthcare Providers: GravelBags.it This test is not yet approved or cleared by the [place] FDA and  has been authorized for detection and/or diagnosis of SARS-CoV-2 by  FDA under an Emergency Use Authorization (EUA). This EUA will remain  in effect (meaning this  test can be used) for the duration of the  Covid-19 declaration under Section 564(b)(1) of the Act, 21  U.S.C. section 360bbb-3(b)(1), unless the authorization is  terminated or revoked. Performed at [facility] [address] [place], [place] [address]     Procedures and diagnostic studies:  EEG  Result Date: 08/20/2019 Alexis Goodell, MD     08/20/2019  4:59 PM ELECTROENCEPHALOGRAM REPORT  Patient: Amber Dixon       Room #: HQ46N-GE EEG No. ID: [id number] Age: 69 y.o.        Sex: female Requesting Physician: Mal Misty Report Date:  08/20/2019       Interpreting Physician: Alexis Goodell History: Laprecious Austill is an 69 y.o. female with altered mental status Medications: Norvasc, Rocephin, Catapres, Lasix, Insulin Conditions of Recording:  This is a 21 channel routine scalp EEG performed with bipolar and monopolar montages arranged in accordance to the international 10/20 system of electrode placement. One channel was dedicated to EKG recording. The patient is in the poorly responsive state. Description:  The background activity is slow and poorly organized diffusely with frequent, high voltage, generalized, intermittent periodic discharges of triphasic morphology.  These are continuous throughout the recording and occur at a frequency of approximately 2-3 Hz.  There is no change in the background rhythm with stimulation.  Hyperventilation and intermittent photic stimulation were not performed. IMPRESSION: This is a markedly abnormal electroencephalogram due to general background slowing and frequent, generalized triphasic waves.  This finding is consistent with a severe encephalopathic state, etiology nonspecific. Alexis Goodell, MD Neurology [phone] 08/20/2019, 4:02 PM   MR BRAIN WO CONTRAST  Result Date: 08/19/2019 CLINICAL DATA:  In stage renal disease, diabetes and hypertension. Altered mental status. EXAM: MRI HEAD WITHOUT CONTRAST TECHNIQUE: Multiplanar, multiecho pulse sequences of the brain and surrounding structures were obtained without intravenous contrast. COMPARISON:  Head CT 3 days ago FINDINGS: Brain: Diffusion imaging does not show any acute or subacute infarction. No focal abnormality affects the brainstem or cerebellum. Cerebral hemispheres show old small vessel infarction in the left basal ganglia and mild to moderate chronic small-vessel ischemic  changes of the cerebral hemispheric white matter. There is an old right parietal cortical and subcortical infarction. No mass lesion, hemorrhage, hydrocephalus or extra-axial collection. Vascular: Major vessels at the base of the brain show flow. Skull and upper cervical spine: Negative Sinuses/Orbits: Mild mucosal inflammatory changes of the sinuses. Bilateral mastoid effusions. Orbits negative. Other: None IMPRESSION: No acute finding. 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 Moderate chronic small-vessel ischemic changes of the hemispheric white matter. Electronically Signed   By: Nelson Chimes M.D.   On: 08/19/2019 17:08   DG Abd Portable 1V  Result Date: 08/19/2019 CLINICAL DATA:  NG tube placement. EXAM: PORTABLE ABDOMEN - 1 VIEW COMPARISON:  None. FINDINGS: Tip and side port of the enteric tube below the diaphragm in the stomach. Right-sided dialysis catheter is partially included. Heart is enlarged. Normal bowel gas pattern in the upper abdomen. IMPRESSION: Tip and side port of the enteric tube below the diaphragm in the stomach. Electronically Signed   By: Keith Rake M.D.   On: 08/19/2019 18:45    Medications:   . amLODipine  10 mg Oral Daily  . B-complex with vitamin C  1 tablet Per Tube QHS  . Chlorhexidine Gluconate Cloth  6 each Topical Daily  . cloNIDine  0.2 mg Transdermal Weekly  . feeding supplement (JEVITY 1.5  CAL/FIBER)  1,000 mL Oral Q24H  . furosemide  80 mg Oral QODAY  . heparin  5,000 Units Subcutaneous Q12H  . insulin aspart  0-6 Units Subcutaneous Q4H   Continuous Infusions: . cefTRIAXone (ROCEPHIN)  IV 2 g (08/20/19 0807)     LOS: 4 days   Jernie Schutt  [facility]     08/20/2019, 10:16 PM

## 2019-08-20 NOTE — Procedures (Signed)
ELECTROENCEPHALOGRAM REPORT   Patient: Amber Dixon       Room #: IC20A-AA EEG No. ID: [id number] Age: 69 y.o.        Sex: female Requesting Physician: Mal Misty Report Date:  08/20/2019        Interpreting Physician: Alexis Goodell  History: [name] is an 69 y.o. female with altered mental status  Medications:  Norvasc, Rocephin, Catapres, Lasix, Insulin  Conditions of Recording:  This is a 21 channel routine scalp EEG performed with bipolar and monopolar montages arranged in accordance to the international 10/20 system of electrode placement. One channel was dedicated to EKG recording.  The patient is in the poorly responsive state.  Description:  The background activity is slow and poorly organized diffusely with frequent, high voltage, generalized, intermittent periodic discharges of triphasic morphology.  These are continuous throughout the recording and occur at a frequency of approximately 2-3 Hz.  There is no change in the background rhythm with stimulation.   Hyperventilation and intermittent photic stimulation were not performed.  IMPRESSION: This is a markedly abnormal electroencephalogram due to general background slowing and frequent, generalized triphasic waves.  This finding is consistent with a severe encephalopathic state, etiology nonspecific.   Alexis Goodell, MD Neurology [phone] 08/20/2019, 4:02 PM

## 2019-08-20 NOTE — Progress Notes (Signed)
SLP Cancellation Note  Patient Details Name: Amber Dixon MRN: [id number] DOB: [birth year]   Cancelled treatment:       Reason Eval/Treat Not Completed: Medical issues which prohibited therapy;Patient not medically ready;Fatigue/lethargy limiting ability to participate;Patient's level of consciousness(chart reviewed; consulted NSG re: pt's status)  Pt is currently lethargic only responding briefly to mod-max verbal/tactile stim. Pt now has an NG in place for nutrition/meds.  Due to pt's presentation, and current NG tube in place, pt is not safe for BSE at this time. ST services will continue to follow pt's status for appropriate time for BSE to be attempted. Recommend frequent oral care for hygiene and stimulation of swallowing. NSG agreed.     Orinda Kenner, MS, CCC-SLP Manar Smalling 08/20/2019, 9:41 AM

## 2019-08-20 NOTE — Progress Notes (Signed)
[facility]  ROUNDING NOTE   Subjective:   Patient unable to speak and opens eyes to verbal stimuli. Discussed case with patient's daughter-in-law Amber Dixon.   Hemodialysis treatment yesterday. Tolerated treatment well.  UF 867mL.   Objective:  Vital signs in last 24 hours:  Temp:  [98.2 F (36.8 C)-99.3 F (37.4 C)] 98.4 F (36.9 C) (04/28 0800) Pulse Rate:  [89-103] 100 (04/28 1000) Resp:  [13-39] 15 (04/28 1000) BP: (129-197)/(56-83) 129/56 (04/28 1000) SpO2:  [97 %-100 %] 98 % (04/28 1000)  Weight change:  Filed Weights   08/16/19 2023 08/17/19 0624  Weight: 31.7 kg 33.6 kg    Intake/Output: I/O last 3 completed shifts: In: 0  Out: 835 [Other:835]   Intake/Output this shift:  No intake/output data recorded.  Physical Exam: General: NAD, laying in bed  Head: Normocephalic, atraumatic. Moist oral mucosal membranes  Eyes: Anicteric, PERRL  Neck: Supple, trachea midline  Lungs:  Clear to auscultation  Heart: Regular rate and rhythm  Abdomen:  Soft, nontender  Extremities:  no peripheral edema.  Neurologic: lethargic  Skin: No lesions  Access: Right AVF    Basic Metabolic Panel: Recent Labs  Lab 08/14/19 1831 08/14/19 1831 08/16/19 2029 08/17/19 0243 08/17/19 0656 08/19/19 0406  NA 130*  --  134* 134*  --  142  K 3.5  --  3.8 3.9  --  4.3  CL 92*  --  92* 93*  --  104  CO2 26  --  32 29  --  17*  GLUCOSE 236*  --  224* 260*  --  131*  BUN 9  --  14 24*  --  96*  CREATININE 1.71*  --  1.88* 2.60*  --  6.78*  CALCIUM 7.9*   < > 8.6* 8.1*  --  7.7*  MG  --   --   --   --  2.3 2.7*  PHOS  --   --   --   --  3.9 7.0*   < > = values in this interval not displayed.    Liver Function Tests: Recent Labs  Lab 08/14/19 1831 08/16/19 2029 08/19/19 0406  AST 39 48*  --   ALT 28 31  --   ALKPHOS 161* 155*  --   BILITOT 0.5 0.4  --   PROT 6.9 7.8  --   ALBUMIN 3.5 4.2 3.4*   No results for input(s): LIPASE, AMYLASE in the last 168  hours. Recent Labs  Lab 08/18/19 0448 08/19/19 0406 08/20/19 0444  AMMONIA 30 36* 25    CBC: Recent Labs  Lab 08/14/19 1831 08/16/19 2029 08/17/19 0243 08/19/19 0406  WBC 5.2 6.4 6.6 11.3*  NEUTROABS 4.1 5.1  --   --   HGB 12.3 12.1 10.2* 9.6*  HCT 40.0 38.0 31.8* 29.4*  MCV 85.8 82.4 82.2 82.8  PLT 168 215 176 198    Cardiac Enzymes: No results for input(s): CKTOTAL, CKMB, CKMBINDEX, TROPONINI in the last 168 hours.  BNP: Invalid input(s): POCBNP  CBG: Recent Labs  Lab 08/19/19 1943 08/19/19 2340 08/20/19 0401 08/20/19 0746 08/20/19 1134  GLUCAP 94 94 97 102* 107*    Microbiology: Results for orders placed or performed during the hospital encounter of 08/16/19  Urine culture     Status: None   Collection Time: 08/16/19  8:23 PM   Specimen: In/Out Cath Urine  Result Value Ref Range Status   Specimen Description   Final    IN/OUT CATH URINE  Performed at [facility], [address] [place], [place] [address]    Special Requests   Final    NONE Performed at [facility], [address], [place], [place] [address]    Culture   Final    NO GROWTH Performed at [facility], [place] [address], [place], [place] [address]    Report Status 08/18/2019 FINAL  Final  Blood Culture (routine x 2)     Status: None (Preliminary result)   Collection Time: 08/16/19  8:29 PM   Specimen: BLOOD  Result Value Ref Range Status   Specimen Description BLOOD BLOOD LEFT FOREARM  Final   Special Requests   Final    BOTTLES DRAWN AEROBIC AND ANAEROBIC Blood Culture adequate volume   Culture   Final    NO GROWTH 4 DAYS Performed at [facility], [address] [place], [place] [address]    Report Status PENDING  Incomplete  Blood Culture (routine x 2)     Status: None (Preliminary result)   Collection Time: 08/16/19  8:29 PM   Specimen: BLOOD  Result Value Ref Range Status   Specimen Description BLOOD BLOOD LEFT ARM  Final   Special  Requests   Final    BOTTLES DRAWN AEROBIC AND ANAEROBIC Blood Culture results may not be optimal due to an inadequate volume of blood received in culture bottles   Culture   Final    NO GROWTH 4 DAYS Performed at [facility], [address], [place], [place] [address]    Report Status PENDING  Incomplete  Respiratory Panel by RT PCR (Flu A&B, Covid) - Urine, Clean Catch     Status: None   Collection Time: 08/16/19  8:47 PM   Specimen: Urine, Clean Catch  Result Value Ref Range Status   SARS Coronavirus 2 by RT PCR NEGATIVE NEGATIVE Final    Comment: (NOTE) SARS-CoV-2 target nucleic acids are NOT DETECTED. The SARS-CoV-2 RNA is generally detectable in upper respiratoy specimens during the acute phase of infection. The lowest concentration of SARS-CoV-2 viral copies this assay can detect is 131 copies/mL. A negative result does not preclude SARS-Cov-2 infection and should not be used as the sole basis for treatment or other patient management decisions. A negative result may occur with  improper specimen collection/handling, submission of specimen other than nasopharyngeal swab, presence of viral mutation(s) within the areas targeted by this assay, and inadequate number of viral copies (<131 copies/mL). A negative result must be combined with clinical observations, patient history, and epidemiological information. The expected result is Negative. Fact Sheet for Patients:  PinkCheek.be Fact Sheet for Healthcare Providers:  GravelBags.it This test is not yet ap proved or cleared by the [place] FDA and  has been authorized for detection and/or diagnosis of SARS-CoV-2 by FDA under an Emergency Use Authorization (EUA). This EUA will remain  in effect (meaning this test can be used) for the duration of the COVID-19 declaration under Section 564(b)(1) of the Act, 21 U.S.C. section 360bbb-3(b)(1), unless the  authorization is terminated or revoked sooner.    Influenza A by PCR NEGATIVE NEGATIVE Final   Influenza B by PCR NEGATIVE NEGATIVE Final    Comment: (NOTE) The Xpert Xpress SARS-CoV-2/FLU/RSV assay is intended as an aid in  the diagnosis of influenza from Nasopharyngeal swab specimens and  should not be used as a sole basis for treatment. Nasal washings and  aspirates are unacceptable for Xpert Xpress SARS-CoV-2/FLU/RSV  testing. Fact Sheet  for Patients: PinkCheek.be Fact Sheet for Healthcare Providers: GravelBags.it This test is not yet approved or cleared by the [place] FDA and  has been authorized for detection and/or diagnosis of SARS-CoV-2 by  FDA under an Emergency Use Authorization (EUA). This EUA will remain  in effect (meaning this test can be used) for the duration of the  Covid-19 declaration under Section 564(b)(1) of the Act, 21  U.S.C. section 360bbb-3(b)(1), unless the authorization is  terminated or revoked. Performed at [facility], [address] [place], [place] [address]     Coagulation Studies: No results for input(s): LABPROT, INR in the last 72 hours.  Urinalysis: No results for input(s): COLORURINE, LABSPEC, PHURINE, GLUCOSEU, HGBUR, BILIRUBINUR, KETONESUR, PROTEINUR, UROBILINOGEN, NITRITE, LEUKOCYTESUR in the last 72 hours.  Invalid input(s): APPERANCEUR    Imaging: MR BRAIN WO CONTRAST  Result Date: 08/19/2019 CLINICAL DATA:  In stage renal disease, diabetes and hypertension. Altered mental status. EXAM: MRI HEAD WITHOUT CONTRAST TECHNIQUE: Multiplanar, multiecho pulse sequences of the brain and surrounding structures were obtained without intravenous contrast. COMPARISON:  Head CT 3 days ago FINDINGS: Brain: Diffusion imaging does not show any acute or subacute infarction. No focal abnormality affects the brainstem or cerebellum. Cerebral hemispheres show old small vessel  infarction in the left basal ganglia and mild to moderate chronic small-vessel ischemic changes of the cerebral hemispheric white matter. There is an old right parietal cortical and subcortical infarction. No mass lesion, hemorrhage, hydrocephalus or extra-axial collection. Vascular: Major vessels at the base of the brain show flow. Skull and upper cervical spine: Negative Sinuses/Orbits: Mild mucosal inflammatory changes of the sinuses. Bilateral mastoid effusions. Orbits negative. Other: None IMPRESSION: No acute finding. 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 Moderate chronic small-vessel ischemic changes of the hemispheric white matter. Electronically Signed   By: Nelson Chimes M.D.   On: 08/19/2019 17:08   DG Abd Portable 1V  Result Date: 08/19/2019 CLINICAL DATA:  NG tube placement. EXAM: PORTABLE ABDOMEN - 1 VIEW COMPARISON:  None. FINDINGS: Tip and side port of the enteric tube below the diaphragm in the stomach. Right-sided dialysis catheter is partially included. Heart is enlarged. Normal bowel gas pattern in the upper abdomen. IMPRESSION: Tip and side port of the enteric tube below the diaphragm in the stomach. Electronically Signed   By: Keith Rake M.D.   On: 08/19/2019 18:45     Medications:   . cefTRIAXone (ROCEPHIN)  IV 2 g (08/20/19 0807)   . amLODipine  10 mg Oral Daily  . B-complex with vitamin C  1 tablet Per Tube QHS  . Chlorhexidine Gluconate Cloth  6 each Topical Daily  . cloNIDine  0.2 mg Transdermal Weekly  . feeding supplement (JEVITY 1.5 CAL/FIBER)  1,000 mL Oral Q24H  . furosemide  80 mg Oral QODAY  . heparin  5,000 Units Subcutaneous Q12H  . insulin aspart  0-6 Units Subcutaneous Q4H   acetaminophen **OR** acetaminophen, hydrALAZINE, ipratropium-albuterol, labetalol, magnesium hydroxide, ondansetron **OR** ondansetron (ZOFRAN) IV  Assessment/ Plan:  [name] is a 69 y.o. Hispanic  female with end stage renal disease on hemodialysis, diabetes mellitus, hypertension, who was admitted 08/16/2019 for Encephalopathy [G93.40] Sepsis ([place]) [A41.9] Fever, unspecified fever cause [R50.9] Altered mental status, unspecified altered mental status type [R41.82]  [facility] ([place] [place] T[facility] Right AVF  1. End stage renal disease: Dialysis for tomorrow.   2. Hypertension: Home regimen of amlodipine and furosemide. PRN  hydralazine  3. Anemia of chronic kidney disease:   - EPO with HD treatment.   4. Secondary Hyperparathyroidism: calcium and phosphorus at goal. Not currently on binders.   5. Encephalopathy: discontinue all antibiotics. Appreciate Neurology input. EEG and LP may be of benefit.    LOS: 4 Heli Dino 4/28/20211:01 PM

## 2019-08-20 NOTE — Progress Notes (Signed)
Initial Nutrition Assessment  RD working remotely.  DOCUMENTATION CODES:   Underweight  INTERVENTION:  Initiate Jevity 1.5 Cal at 10 mL/hr and advance by 10 mL/hr every 12 hours to goal rate of 40 mL/hr. Goal TF regimen provides 1440 kcal, 61 grams of protein, 730 mL H2O daily.  Provide minimum free water flush of 20-30 mL Q4hrs to maintain tube patency.  Provide B-complex with C QHS per tube.  Monitor magnesium, potassium, and phosphorus daily for at least 3 days, MD to replete as needed, as pt is at risk for refeeding syndrome.  NUTRITION DIAGNOSIS:   Inadequate oral intake related to inability to eat as evidenced by NPO status.  GOAL:   Patient will meet greater than or equal to 90% of their needs  MONITOR:   Labs, Weight trends, TF tolerance, I & O's  REASON FOR ASSESSMENT:   Consult Enteral/tube feeding initiation and management  ASSESSMENT:   69 year old female with PMHx of HTN, DM, ESRD on HD admitted with acute metabolic encephalopathy, hypertensive emergency.   According to chart patient is somnolent and nonverbal. Patient was too lethargic for safe participation in SLP evaluation or safe PO intake so she has been NPO since her admission on 4/24. Noted in chart patient had a recent admission 03/2019 at [facility] where she had poor PO intake and was followed by RDs. She had another admission in 05/2019. Per review of chart patient's family discussed goals of care with Palliative Medicine and Attending yesterday. They decided to proceed with full scope care and NGT placement. NGT was placed yesterday evening.  Limited weight history available in chart. Patient was 35.5 kg on 03/25/2019. She is now 33.6 kg kg (74.07 lbs). She has lost 1.9 kg (5.5% body weight) over the past almost 5 months, which is not signficanit for time frame. Cannot see a documented EDW in chart.  Medications reviewed and include: Lasix 80 mg every other day, Novolog 0-6 units Q4hrs,  ceftriaxone.  Labs reviewed: CBG 86-102. On 4/27 CO2 17, BUN 96, Creatinine .78, Phosphorus 7, Magnesium 2.7.  Enteral Access: 18 Fr. NGT placed 4/27; terminates in stomach per abdominal x-ray 4/27  I/O: pt anuric  RD suspects patient is severely malnourished. Unable to confirm without nutrition history and NFPE.  NUTRITION - FOCUSED PHYSICAL EXAM:  Unable to complete at this time as RD is working remotely.  Diet Order:   Diet Order            Diet NPO time specified  Diet effective now             EDUCATION NEEDS:   No education needs have been identified at this time  Skin:  Skin Assessment: Reviewed RN Assessment  Last BM:  08/20/2019 - small type 6  Height:   Ht Readings from Last 1 Encounters:  08/16/19 4\' 11"  (1.499 m)   Weight:   Wt Readings from Last 1 Encounters:  08/17/19 33.6 kg   Ideal Body Weight:  43.2 kg  BMI:  Body mass index is 14.96 kg/m.  Estimated Nutritional Needs:   Kcal:  1200-1400  Protein:  60-70 grams  Fluid:  UOP + 1 L  Jacklynn Barnacle, MS, RD, LDN Pager number available on Amion

## 2019-08-20 NOTE — Progress Notes (Signed)
eeg completed ° °

## 2019-08-20 NOTE — Progress Notes (Signed)
Patient ID: Amber Dixon, female   DOB: 30-Oct-1950, 69 y.o.   MRN: [id number]  This NP visited patient at the bedside as a follow up for Palliative Medicine needs and emotional support.  I spoke to son/Amber Dixon at the bedside with the assistance of interpretor. Discussed the seriousness of the patient's current medcial situation.  She remains minimally responsive to vigorous tactile stimuli, she does not respond to her son or the interpretor's verbal questions.      Education offered regarding encephalopathy; possible underlying reasons for, diagnostics associated with work-up and  importance of AD discussion and planning specific to code status.  Education offered on the importance of family coming together to discuss and make decisions related to treatmtnt plan for this patient .  This son was supposed to be part of a meeting yesterday but did not.   Again schulded a meeting for tomorrow at 54, Clifton James agrees to be present.  I spoke to [name] and they too plan to be here tomorrow  Questions and concerns addressed     Discussed with Dr Doy Mince and Dr Marita Snellen  Total time spent on the unit was 25 minutes  Greater than 50% of the time was spent in counseling and coordination of care  Wadie Lessen NP  Palliative Medicine Team Team Phone # [phone] Pager [phone]

## 2019-08-20 NOTE — Progress Notes (Signed)
Subjective: Patient remains alerted.  Poorly responsive.  Source of fever remains unclear.    Objective: Current vital signs: BP (!) 129/56   Pulse 100   Temp 98.4 F (36.9 C) (Oral)   Resp 15   Ht 4\' 11"  (1.499 m)   Wt 33.6 kg   SpO2 98%   BMI 14.96 kg/m  Vital signs in last 24 hours: Temp:  [98.2 F (36.8 C)-99.3 F (37.4 C)] 98.4 F (36.9 C) (04/28 0800) Pulse Rate:  [89-103] 100 (04/28 1000) Resp:  [13-39] 15 (04/28 1000) BP: (129-197)/(56-83) 129/56 (04/28 1000) SpO2:  [97 %-100 %] 98 % (04/28 1000)  Intake/Output from previous day: 04/27 0701 - 04/28 0700 In: -  Out: 835  Intake/Output this shift: No intake/output data recorded. Nutritional status:  Diet Order            Diet NPO time specified  Diet effective now              Neurologic Exam: Mental Status: Lethargic.  Able to be aroused to open eyes but no speech.  Does not follow commands.   Cranial Nerves: II: Does not blink to bilateral confrontation.  Pupils equal, round, reactive to light and accommodation III,IV, VI: right gaze deviation V,VII: corneals intact bilaterally VIII: unable to test IX,X: unable to test XI: unable to test XII: unable to test Motor: Movement noted on the right upper and lower extremity but no movement noted on the left Sensory: Responds to noxious stimuli on the right but not on the left Deep Tendon Reflexes: Decreased throughout Plantars: Right: mute   Left: mute Cerebellar: Unable to perform Gait: Unable to perform  Lab Results: Basic Metabolic Panel: Recent Labs  Lab 08/14/19 1831 08/14/19 1831 08/16/19 2029 08/17/19 0243 08/17/19 0656 08/19/19 0406  NA 130*  --  134* 134*  --  142  K 3.5  --  3.8 3.9  --  4.3  CL 92*  --  92* 93*  --  104  CO2 26  --  32 29  --  17*  GLUCOSE 236*  --  224* 260*  --  131*  BUN 9  --  14 24*  --  96*  CREATININE 1.71*  --  1.88* 2.60*  --  6.78*  CALCIUM 7.9*   < > 8.6* 8.1*  --  7.7*  MG  --   --   --   --  2.3  2.7*  PHOS  --   --   --   --  3.9 7.0*   < > = values in this interval not displayed.    Liver Function Tests: Recent Labs  Lab 08/14/19 1831 08/16/19 2029 08/19/19 0406  AST 39 48*  --   ALT 28 31  --   ALKPHOS 161* 155*  --   BILITOT 0.5 0.4  --   PROT 6.9 7.8  --   ALBUMIN 3.5 4.2 3.4*   No results for input(s): LIPASE, AMYLASE in the last 168 hours. Recent Labs  Lab 08/18/19 0448 08/19/19 0406 08/20/19 0444  AMMONIA 30 36* 25    CBC: Recent Labs  Lab 08/14/19 1831 08/16/19 2029 08/17/19 0243 08/19/19 0406  WBC 5.2 6.4 6.6 11.3*  NEUTROABS 4.1 5.1  --   --   HGB 12.3 12.1 10.2* 9.6*  HCT 40.0 38.0 31.8* 29.4*  MCV 85.8 82.4 82.2 82.8  PLT 168 215 176 198    Cardiac Enzymes: No results for input(s): CKTOTAL, CKMB, CKMBINDEX,  TROPONINI in the last 168 hours.  Lipid Panel: No results for input(s): CHOL, TRIG, HDL, CHOLHDL, VLDL, LDLCALC in the last 168 hours.  CBG: Recent Labs  Lab 08/19/19 1943 08/19/19 2340 08/20/19 0401 08/20/19 0746 08/20/19 1134  GLUCAP 94 94 97 102* 107*    Microbiology: Results for orders placed or performed during the hospital encounter of 08/16/19  Urine culture     Status: None   Collection Time: 08/16/19  8:23 PM   Specimen: In/Out Cath Urine  Result Value Ref Range Status   Specimen Description   Final    IN/OUT CATH URINE Performed at [facility], [address], [place], [place] [address]    Special Requests   Final    NONE Performed at [facility], [address] [place], [place] [address]    Culture   Final    NO GROWTH Performed at [facility], [place] [address] [place], [place] [address]    Report Status 08/18/2019 FINAL  Final  Blood Culture (routine x 2)     Status: None (Preliminary result)   Collection Time: 08/16/19  8:29 PM   Specimen: BLOOD  Result Value Ref Range Status   Specimen Description BLOOD BLOOD LEFT FOREARM  Final   Special Requests   Final    BOTTLES  DRAWN AEROBIC AND ANAEROBIC Blood Culture adequate volume   Culture   Final    NO GROWTH 4 DAYS Performed at [facility], [address] [place], [place] [address]    Report Status PENDING  Incomplete  Blood Culture (routine x 2)     Status: None (Preliminary result)   Collection Time: 08/16/19  8:29 PM   Specimen: BLOOD  Result Value Ref Range Status   Specimen Description BLOOD BLOOD LEFT ARM  Final   Special Requests   Final    BOTTLES DRAWN AEROBIC AND ANAEROBIC Blood Culture results may not be optimal due to an inadequate volume of blood received in culture bottles   Culture   Final    NO GROWTH 4 DAYS Performed at [facility], [address], [place], [place] [address]    Report Status PENDING  Incomplete  Respiratory Panel by RT PCR (Flu A&B, Covid) - Urine, Clean Catch     Status: None   Collection Time: 08/16/19  8:47 PM   Specimen: Urine, Clean Catch  Result Value Ref Range Status   SARS Coronavirus 2 by RT PCR NEGATIVE NEGATIVE Final    Comment: (NOTE) SARS-CoV-2 target nucleic acids are NOT DETECTED. The SARS-CoV-2 RNA is generally detectable in upper respiratoy specimens during the acute phase of infection. The lowest concentration of SARS-CoV-2 viral copies this assay can detect is 131 copies/mL. A negative result does not preclude SARS-Cov-2 infection and should not be used as the sole basis for treatment or other patient management decisions. A negative result may occur with  improper specimen collection/handling, submission of specimen other than nasopharyngeal swab, presence of viral mutation(s) within the areas targeted by this assay, and inadequate number of viral copies (<131 copies/mL). A negative result must be combined with clinical observations, patient history, and epidemiological information. The expected result is Negative. Fact Sheet for Patients:  PinkCheek.be Fact Sheet for Healthcare Providers:   GravelBags.it This test is not yet ap proved or cleared by the [place] FDA and  has been authorized for detection and/or diagnosis of SARS-CoV-2 by FDA under an Emergency Use Authorization (EUA). This EUA will remain  in effect (meaning this test can be used) for the duration of the COVID-19 declaration under Section 564(b)(1) of the Act, 21 U.S.C. section 360bbb-3(b)(1), unless the authorization is terminated or revoked sooner.    Influenza A by PCR NEGATIVE NEGATIVE Final   Influenza B by PCR NEGATIVE NEGATIVE Final    Comment: (NOTE) The Xpert Xpress SARS-CoV-2/FLU/RSV assay is intended as an aid in  the diagnosis of influenza from Nasopharyngeal swab specimens and  should not be used as a sole basis for treatment. Nasal washings and  aspirates are unacceptable for Xpert Xpress SARS-CoV-2/FLU/RSV  testing. Fact Sheet for Patients: PinkCheek.be Fact Sheet for Healthcare Providers: GravelBags.it This test is not yet approved or cleared by the [place] FDA and  has been authorized for detection and/or diagnosis of SARS-CoV-2 by  FDA under an Emergency Use Authorization (EUA). This EUA will remain  in effect (meaning this test can be used) for the duration of the  Covid-19 declaration under Section 564(b)(1) of the Act, 21  U.S.C. section 360bbb-3(b)(1), unless the authorization is  terminated or revoked. Performed at [facility], [address] [place], [place] [address]     Coagulation Studies: No results for input(s): LABPROT, INR in the last 72 hours.  Imaging: MR BRAIN WO CONTRAST  Result Date: 08/19/2019 CLINICAL DATA:  In stage renal disease, diabetes and hypertension. Altered mental status. EXAM: MRI HEAD WITHOUT CONTRAST TECHNIQUE: Multiplanar, multiecho pulse sequences of the brain and surrounding structures were obtained without intravenous contrast.  COMPARISON:  Head CT 3 days ago FINDINGS: Brain: Diffusion imaging does not show any acute or subacute infarction. No focal abnormality affects the brainstem or cerebellum. Cerebral hemispheres show old small vessel infarction in the left basal ganglia and mild to moderate chronic small-vessel ischemic changes of the cerebral hemispheric white matter. There is an old right parietal cortical and subcortical infarction. No mass lesion, hemorrhage, hydrocephalus or extra-axial collection. Vascular: Major vessels at the base of the brain show flow. Skull and upper cervical spine: Negative Sinuses/Orbits: Mild mucosal inflammatory changes of the sinuses. Bilateral mastoid effusions. Orbits negative. Other: None IMPRESSION: No acute finding. 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 Moderate chronic small-vessel ischemic changes of the hemispheric white matter. Electronically Signed   By: Nelson Chimes M.D.   On: 08/19/2019 17:08   DG Abd Portable 1V  Result Date: 08/19/2019 CLINICAL DATA:  NG tube placement. EXAM: PORTABLE ABDOMEN - 1 VIEW COMPARISON:  None. FINDINGS: Tip and side port of the enteric tube below the diaphragm in the stomach. Right-sided dialysis catheter is partially included. Heart is enlarged. Normal bowel gas pattern in the upper abdomen. IMPRESSION: Tip and side port of the enteric tube below the diaphragm in the stomach. Electronically Signed   By: Keith Rake M.D.   On: 08/19/2019 18:45    Medications:  I have reviewed the patient's current medications. Scheduled: . amLODipine  10 mg Oral Daily  . B-complex with vitamin C  1 tablet Per Tube QHS  . Chlorhexidine Gluconate Cloth  6 each Topical Daily  . cloNIDine  0.2 mg Transdermal Weekly  . feeding supplement (JEVITY 1.5 CAL/FIBER)  1,000 mL Oral Q24H  . furosemide  80 mg Oral QODAY  . heparin  5,000 Units Subcutaneous Q12H  . insulin aspart  0-6 Units Subcutaneous Q4H     Assessment/Plan: 69 y.o. female with a known history of type 2 diabetes mellitus, hypertension and chronic kidney disease, who presented  to the emergency room with acute onset of altered mental status with confusion and decreased responsiveness with associated elevated blood pressure along with fever.  Etiology of fever unclear.  Depressed mental status felt to be secondary to metabolic encephalopathy.  BUN/Cr well above baseline with labs on yesterday.  Neurological examination is quite focal today though.  This was not noted on previous neurological evaluation.  MRI of the brain personally reviewed and shows no acute changes.  Although metabolic encephalopathy remains on the differential can not rule out CNS infection and subclinical seizure activity.    Recommendations: 1. EEG 2. Patient has been on Cefepime, Rocephin, Flagyl and Vancomycin.  Only remains on Rocephin.  Unable to obtain consent for LP today from family member at bedside.  There is a family meeting scheduled for tomorrow.  Will discuss again at that time.     Spanish interpreter present for this evaluation   LOS: 4 days   Alexis Goodell, MD Neurology [phone] 08/20/2019  1:10 PM

## 2019-08-20 NOTE — Progress Notes (Signed)
PT Cancellation Note  Patient Details Name: Amber Dixon MRN: [id number] DOB: 1951/02/10   Cancelled Treatment:    Reason Eval/Treat Not Completed: Patient's level of consciousness(Chart reviewed, RN consulted. Pt remains somnolent, difficult to rouse, not very interactive. PT unable to evaluate for 3 days now due to presentation. Will cancel order and await new order once pt is able to participate.)   11:19 AM, 08/20/19 Etta Grandchild, PT, DPT Physical Therapist - [facility]  [phone] ([place])    Ariz Terrones C 08/20/2019, 11:19 AM

## 2019-08-21 ENCOUNTER — Telehealth: Payer: Self-pay

## 2019-08-21 DIAGNOSIS — N186 End stage renal disease: Secondary | ICD-10-CM

## 2019-08-21 DIAGNOSIS — Z992 Dependence on renal dialysis: Secondary | ICD-10-CM

## 2019-08-21 LAB — CULTURE, BLOOD (ROUTINE X 2)
Culture: NO GROWTH
Culture: NO GROWTH
Special Requests: ADEQUATE

## 2019-08-21 LAB — CBC
HCT: 32.7 % — ABNORMAL LOW (ref 36.0–46.0)
Hemoglobin: 10.7 g/dL — ABNORMAL LOW (ref 12.0–15.0)
MCH: 26.9 pg (ref 26.0–34.0)
MCHC: 32.7 g/dL (ref 30.0–36.0)
MCV: 82.2 fL (ref 80.0–100.0)
Platelets: 219 10*3/uL (ref 150–400)
RBC: 3.98 MIL/uL (ref 3.87–5.11)
RDW: 16.7 % — ABNORMAL HIGH (ref 11.5–15.5)
WBC: 8.2 10*3/uL (ref 4.0–10.5)
nRBC: 0 % (ref 0.0–0.2)

## 2019-08-21 LAB — RENAL FUNCTION PANEL
Albumin: 3 g/dL — ABNORMAL LOW (ref 3.5–5.0)
Anion gap: 18 — ABNORMAL HIGH (ref 5–15)
BUN: 93 mg/dL — ABNORMAL HIGH (ref 8–23)
CO2: 27 mmol/L (ref 22–32)
Calcium: 7.8 mg/dL — ABNORMAL LOW (ref 8.9–10.3)
Chloride: 100 mmol/L (ref 98–111)
Creatinine, Ser: 6.81 mg/dL — ABNORMAL HIGH (ref 0.44–1.00)
GFR calc Af Amer: 7 mL/min — ABNORMAL LOW (ref >60)
GFR calc non Af Amer: 6 mL/min — ABNORMAL LOW (ref >60)
Glucose, Bld: 185 mg/dL — ABNORMAL HIGH (ref 70–99)
Phosphorus: 7.3 mg/dL — ABNORMAL HIGH (ref 2.5–4.6)
Potassium: 3.5 mmol/L (ref 3.5–5.1)
Sodium: 145 mmol/L (ref 135–145)

## 2019-08-21 LAB — GLUCOSE, CAPILLARY
Glucose-Capillary: 126 mg/dL — ABNORMAL HIGH (ref 70–99)
Glucose-Capillary: 160 mg/dL — ABNORMAL HIGH (ref 70–99)
Glucose-Capillary: 158 mg/dL — ABNORMAL HIGH (ref 70–99)
Glucose-Capillary: 167 mg/dL — ABNORMAL HIGH (ref 70–99)

## 2019-08-21 LAB — AMMONIA: Ammonia: 23 umol/L (ref 9–35)

## 2019-08-21 LAB — LYME DISEASE DNA BY PCR(BORRELIA BURG): Lyme Disease(B.burgdorferi)PCR: NEGATIVE

## 2019-08-21 LAB — MAGNESIUM: Magnesium: 2.6 mg/dL — ABNORMAL HIGH (ref 1.7–2.4)

## 2019-08-21 LAB — PHOSPHORUS: Phosphorus: 7.2 mg/dL — ABNORMAL HIGH (ref 2.5–4.6)

## 2019-08-21 NOTE — Telephone Encounter (Signed)
Called pt with interpretor on 4/29 at 3:01pm. Pt is still in hospital so son answered phone and asked to be called back. He was unable to speak at that time.

## 2019-08-21 NOTE — Progress Notes (Signed)
Pt responds to voice. Not verbal at this time. Opens eyes spontaneously. Arms and legs are flaccid bilaterally. VSS. Palliative at bedside with family and interpretor. Unable to get 4pm CBG due to pt being at dialysis. Will check when she returns to the unit.

## 2019-08-21 NOTE — Progress Notes (Signed)
[facility]  ROUNDING NOTE   Subjective:   Family meeting for today. Hemodialysis for later today.   Family has decided to continue aggressive care.   Objective:  Vital signs in last 24 hours:  Temp:  [98.2 F (36.8 C)-99.6 F (37.6 C)] 99.2 F (37.3 C) (04/29 1130) Pulse Rate:  [85-99] 98 (04/29 1300) Resp:  [0-24] 20 (04/29 1300) BP: (123-176)/(57-74) 171/71 (04/29 1300) SpO2:  [96 %-100 %] 98 % (04/29 1300) Weight:  [30.6 kg] 30.6 kg (04/29 0500)  Weight change:  Filed Weights   08/16/19 2023 08/17/19 0624 08/21/19 0500  Weight: 31.7 kg 33.6 kg 30.6 kg    Intake/Output: I/O last 3 completed shifts: In: 200 [NG/GT:200] Out: 0    Intake/Output this shift:  Total I/O In: 100 [IV Piggyback:100] Out: -   Physical Exam: General: NAD, laying in bed  Head: Normocephalic, atraumatic. Moist oral mucosal membranes  Eyes: Anicteric, PERRL  Neck: Supple, trachea midline  Lungs:  Clear to auscultation  Heart: Regular rate and rhythm  Abdomen:  Soft, nontender  Extremities:  no peripheral edema.  Neurologic: lethargic  Skin: No lesions  Access: Right AVF    Basic Metabolic Panel: Recent Labs  Lab 08/14/19 1831 08/14/19 1831 08/16/19 2029 08/17/19 0243 08/17/19 0656 08/19/19 0406 08/20/19 1415 08/21/19 0447  NA 130*  --  134* 134*  --  142  --   --   K 3.5  --  3.8 3.9  --  4.3  --   --   CL 92*  --  92* 93*  --  104  --   --   CO2 26  --  32 29  --  17*  --   --   GLUCOSE 236*  --  224* 260*  --  131*  --   --   BUN 9  --  14 24*  --  96*  --   --   CREATININE 1.71*  --  1.88* 2.60*  --  6.78*  --   --   CALCIUM 7.9*   < > 8.6* 8.1*  --  7.7*  --   --   MG  --   --   --   --  2.3 2.7* 2.3 2.6*  PHOS  --   --   --   --  3.9 7.0* 7.0* 7.2*   < > = values in this interval not displayed.    Liver Function Tests: Recent Labs  Lab 08/14/19 1831 08/16/19 2029 08/19/19 0406  AST 39 48*  --   ALT 28 31  --   ALKPHOS 161* 155*  --   BILITOT  0.5 0.4  --   PROT 6.9 7.8  --   ALBUMIN 3.5 4.2 3.4*   No results for input(s): LIPASE, AMYLASE in the last 168 hours. Recent Labs  Lab 08/19/19 0406 08/20/19 0444 08/21/19 0447  AMMONIA 36* 25 23    CBC: Recent Labs  Lab 08/14/19 1831 08/16/19 2029 08/17/19 0243 08/19/19 0406 08/21/19 1400  WBC 5.2 6.4 6.6 11.3* 8.2  NEUTROABS 4.1 5.1  --   --   --   HGB 12.3 12.1 10.2* 9.6* 10.7*  HCT 40.0 38.0 31.8* 29.4* 32.7*  MCV 85.8 82.4 82.2 82.8 82.2  PLT 168 215 176 198 219    Cardiac Enzymes: No results for input(s): CKTOTAL, CKMB, CKMBINDEX, TROPONINI in the last 168 hours.  BNP: Invalid input(s): POCBNP  CBG: Recent Labs  Lab 08/20/19 1953  08/20/19 2351 08/21/19 0346 08/21/19 0734 08/21/19 1143  GLUCAP 107* 138* 126* 160* 158*    Microbiology: Results for orders placed or performed during the hospital encounter of 08/16/19  Urine culture     Status: None   Collection Time: 08/16/19  8:23 PM   Specimen: In/Out Cath Urine  Result Value Ref Range Status   Specimen Description   Final    IN/OUT CATH URINE Performed at [facility], [address], [place], [place] [address]    Special Requests   Final    NONE Performed at [facility], [address], [place], [place] [address]    Culture   Final    NO GROWTH Performed at [facility], [place] [address] [place], [place] [address]    Report Status 08/18/2019 FINAL  Final  Blood Culture (routine x 2)     Status: None   Collection Time: 08/16/19  8:29 PM   Specimen: BLOOD  Result Value Ref Range Status   Specimen Description BLOOD BLOOD LEFT FOREARM  Final   Special Requests   Final    BOTTLES DRAWN AEROBIC AND ANAEROBIC Blood Culture adequate volume   Culture   Final    NO GROWTH 5 DAYS Performed at [facility], [address] [place], [place] [address]    Report Status 08/21/2019 FINAL  Final  Blood Culture (routine x 2)     Status: None   Collection Time:  08/16/19  8:29 PM   Specimen: BLOOD  Result Value Ref Range Status   Specimen Description BLOOD BLOOD LEFT ARM  Final   Special Requests   Final    BOTTLES DRAWN AEROBIC AND ANAEROBIC Blood Culture results may not be optimal due to an inadequate volume of blood received in culture bottles   Culture   Final    NO GROWTH 5 DAYS Performed at [facility], [address] [place],  [address]    Report Status 08/21/2019 FINAL  Final  Respiratory Panel by RT PCR (Flu A&B, Covid) - Urine, Clean Catch     Status: None   Collection Time: 08/16/19  8:47 PM   Specimen: Urine, Clean Catch  Result Value Ref Range Status   SARS Coronavirus 2 by RT PCR NEGATIVE NEGATIVE Final    Comment: (NOTE) SARS-CoV-2 target nucleic acids are NOT DETECTED. The SARS-CoV-2 RNA is generally detectable in upper respiratoy specimens during the acute phase of infection. The lowest concentration of SARS-CoV-2 viral copies this assay can detect is 131 copies/mL. A negative result does not preclude SARS-Cov-2 infection and should not be used as the sole basis for treatment or other patient management decisions. A negative result may occur with  improper specimen collection/handling, submission of specimen other than nasopharyngeal swab, presence of viral mutation(s) within the areas targeted by this assay, and inadequate number of viral copies (<131 copies/mL). A negative result must be combined with clinical observations, patient history, and epidemiological information. The expected result is Negative. Fact Sheet for Patients:  PinkCheek.be Fact Sheet for Healthcare Providers:  GravelBags.it This test is not yet ap proved or cleared by the [place] FDA and  has been authorized for detection and/or diagnosis of SARS-CoV-2 by FDA under an Emergency Use Authorization (EUA). This EUA will remain  in effect (meaning this test can be used)  for the duration of the COVID-19 declaration under Section 564(b)(1) of the Act, 21 U.S.C. section 360bbb-3(b)(1), unless the authorization is terminated or revoked sooner.  Influenza A by PCR NEGATIVE NEGATIVE Final   Influenza B by PCR NEGATIVE NEGATIVE Final    Comment: (NOTE) The Xpert Xpress SARS-CoV-2/FLU/RSV assay is intended as an aid in  the diagnosis of influenza from Nasopharyngeal swab specimens and  should not be used as a sole basis for treatment. Nasal washings and  aspirates are unacceptable for Xpert Xpress SARS-CoV-2/FLU/RSV  testing. Fact Sheet for Patients: PinkCheek.be Fact Sheet for Healthcare Providers: GravelBags.it This test is not yet approved or cleared by the [place] FDA and  has been authorized for detection and/or diagnosis of SARS-CoV-2 by  FDA under an Emergency Use Authorization (EUA). This EUA will remain  in effect (meaning this test can be used) for the duration of the  Covid-19 declaration under Section 564(b)(1) of the Act, 21  U.S.C. section 360bbb-3(b)(1), unless the authorization is  terminated or revoked. Performed at [facility], [address] [place], [place] [address]     Coagulation Studies: No results for input(s): LABPROT, INR in the last 72 hours.  Urinalysis: No results for input(s): COLORURINE, LABSPEC, PHURINE, GLUCOSEU, HGBUR, BILIRUBINUR, KETONESUR, PROTEINUR, UROBILINOGEN, NITRITE, LEUKOCYTESUR in the last 72 hours.  Invalid input(s): APPERANCEUR    Imaging: EEG  Result Date: 08/20/2019 Alexis Goodell, MD     08/20/2019  4:59 PM ELECTROENCEPHALOGRAM REPORT Patient: Amber Dixon       Room #: GM01U-UV EEG No. ID: [id number] Age: 69 y.o.        Sex: female Requesting Physician: Mal Misty Report Date:  08/20/2019       Interpreting Physician: Alexis Goodell History: Charo Philipp is an 69 y.o. female with altered mental  status Medications: Norvasc, Rocephin, Catapres, Lasix, Insulin Conditions of Recording:  This is a 21 channel routine scalp EEG performed with bipolar and monopolar montages arranged in accordance to the international 10/20 system of electrode placement. One channel was dedicated to EKG recording. The patient is in the poorly responsive state. Description:  The background activity is slow and poorly organized diffusely with frequent, high voltage, generalized, intermittent periodic discharges of triphasic morphology.  These are continuous throughout the recording and occur at a frequency of approximately 2-3 Hz.  There is no change in the background rhythm with stimulation.  Hyperventilation and intermittent photic stimulation were not performed. IMPRESSION: This is a markedly abnormal electroencephalogram due to general background slowing and frequent, generalized triphasic waves.  This finding is consistent with a severe encephalopathic state, etiology nonspecific. Alexis Goodell, MD Neurology [phone] 08/20/2019, 4:02 PM   MR BRAIN WO CONTRAST  Result Date: 08/19/2019 CLINICAL DATA:  In stage renal disease, diabetes and hypertension. Altered mental status. EXAM: MRI HEAD WITHOUT CONTRAST TECHNIQUE: Multiplanar, multiecho pulse sequences of the brain and surrounding structures were obtained without intravenous contrast. COMPARISON:  Head CT 3 days ago FINDINGS: Brain: Diffusion imaging does not show any acute or subacute infarction. No focal abnormality affects the brainstem or cerebellum. Cerebral hemispheres show old small vessel infarction in the left basal ganglia and mild to moderate chronic small-vessel ischemic changes of the cerebral hemispheric white matter. There is an old right parietal cortical and subcortical infarction. No mass lesion, hemorrhage, hydrocephalus or extra-axial collection. Vascular: Major vessels at the base of the brain show flow. Skull and upper cervical spine: Negative  Sinuses/Orbits: Mild mucosal inflammatory changes of the sinuses. Bilateral mastoid effusions. Orbits negative. Other: None IMPRESSION: No acute finding. 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 Moderate chronic  small-vessel ischemic changes of the hemispheric white matter. Electronically Signed   By: Nelson Chimes M.D.   On: 08/19/2019 17:08   DG Abd Portable 1V  Result Date: 08/19/2019 CLINICAL DATA:  NG tube placement. EXAM: PORTABLE ABDOMEN - 1 VIEW COMPARISON:  None. FINDINGS: Tip and side port of the enteric tube below the diaphragm in the stomach. Right-sided dialysis catheter is partially included. Heart is enlarged. Normal bowel gas pattern in the upper abdomen. IMPRESSION: Tip and side port of the enteric tube below the diaphragm in the stomach. Electronically Signed   By: Keith Rake M.D.   On: 08/19/2019 18:45     Medications:    . amLODipine  10 mg Oral Daily  . B-complex with vitamin C  1 tablet Per Tube QHS  . Chlorhexidine Gluconate Cloth  6 each Topical Daily  . cloNIDine  0.2 mg Transdermal Weekly  . feeding supplement (JEVITY 1.5 CAL/FIBER)  1,000 mL Oral Q24H  . furosemide  80 mg Oral QODAY  . heparin  5,000 Units Subcutaneous Q12H  . insulin aspart  0-6 Units Subcutaneous Q4H   acetaminophen **OR** acetaminophen, hydrALAZINE, ipratropium-albuterol, labetalol, magnesium hydroxide, ondansetron **OR** ondansetron (ZOFRAN) IV  Assessment/ Plan:  Ms. Soo Steelman is a 69 y.o. Hispanic female with end stage renal disease on hemodialysis, diabetes mellitus, hypertension, who was admitted 08/16/2019 for Encephalopathy [G93.40] Sepsis ([place]) [A41.9] Fever, unspecified fever cause [R50.9] Altered mental status, unspecified altered mental status type [R41.82]  [facility] Right AVF  1. End stage renal disease: Dialysis for today, orders prepared.   2.  Hypertension: Home regimen of amlodipine and furosemide. PRN hydralazine  3. Anemia of chronic kidney disease:   - EPO with HD treatment.   4. Secondary Hyperparathyroidism: calcium and phosphorus at goal. Not currently on binders.   5. Encephalopathy:  Appreciate Neurology input    LOS: 5 Lenka Zhao 4/29/20212:14 PM

## 2019-08-21 NOTE — Progress Notes (Signed)
Progress Note    Amber Dixon  EPP:[id number] DOB: 04-04-1951  DOA: 08/16/2019 PCP: Patient, No Pcp Per      Brief Narrative:    Medical records reviewed and are as summarized below:        Assessment/Plan:   Active Problems:   Encephalopathy   Altered mental status   DNR (do not resuscitate) discussion   Palliative care by specialist   SIRS: Patient with fever, tachycardia and altered mental status but no leukocytosis or clear source of infection so far.  Antibiotics have been discontinued  Acute metabolic encephalopathy: Probably starting to improve slightly.  Work-up including CT head, MRI brain, TFT, ammonia and cultures unrevealing.  No lumbar puncture for now per discussion between family and neurologist.  Hypertension/ s/p hypertensive emergency: Presented with SBP in 200s, fluid overload and encephalopathy. BNP elevated to 3000. BP improving. Continue antihypertensives  ESRD/bone marrow disorder: On HD TTS. Follow-up with nephrologist  Anemia of chronic disease: H&H stable. Monitor H&H  Underweight/severe protein calorie malnutrition:Body mass index is 14.96 kg/m.   NG tube was placed on 08/20/2019 and she has been started on enteral nutrition.  Body mass index is 13.63 kg/m.  (Underweight)  Family Communication/Anticipated D/C date and plan/Code Status   DVT prophylaxis: Heparin Code Status: Full code Family Communication: None Disposition Plan:    Status is: Inpatient  Remains inpatient appropriate because:Inpatient level of care appropriate due to severity of illness   Dispo: The patient is from: Home              Anticipated d/c is to: SNF              Anticipated d/c date is: 3 days              Patient currently is not medically stable to d/c.            Subjective:   Overnight events noted.  Patient is unable to provide any history because of lethargy/confusion.  Objective:    Vitals:   08/20/19  1600 08/20/19 1900 08/20/19 2000 08/20/19 2100  BP: 131/62 (!) 145/65 (!) 160/62 (!) 151/65  Pulse: 97 99 98 93  Resp: 17 20 18 18   Temp:  98.4 F (36.9 C)    TempSrc:  Oral    SpO2: 97% 98% 97% 100%  Weight:      Height:       No data found.   Intake/Output Summary (Last 24 hours) at 08/21/2019 1658 Last data filed at 08/21/2019 0815 Gross per 24 hour  Intake 300 ml  Output 0 ml  Net 300 ml   Filed Weights   08/16/19 2023 08/17/19 0624 08/21/19 0500  Weight: 31.7 kg 33.6 kg 30.6 kg    Exam:  GEN: NAD SKIN: Warm and dry EYES: No pallor or icterus ENT: MMM CV: RRR PULM: CTA B ABD: soft, ND, NT, +BS CNS: Lethargic but a little more responsive today.  She responds to verbal stimuli.  She opened her eyes when I touched her abdomen.  All 4 extremities were weak/flaccid. EXT: No edema    Data Reviewed:   I have personally reviewed following labs and imaging studies:  Labs: Labs show the following:   Basic Metabolic Panel: Recent Labs  Lab 08/14/19 1831 08/14/19 1831 08/16/19 2029 08/16/19 2029 08/17/19 0243 08/17/19 0243 08/17/19 0656 08/19/19 0406 08/20/19 1415 08/21/19 0447 08/21/19 1400  NA 130*  --  134*  --  134*  --   --  142  --   --  145  K 3.5   < > 3.8   < > 3.9   < >  --  4.3  --   --  3.5  CL 92*  --  92*  --  93*  --   --  104  --   --  100  CO2 26  --  32  --  29  --   --  17*  --   --  27  GLUCOSE 236*  --  224*  --  260*  --   --  131*  --   --  185*  BUN 9  --  14  --  24*  --   --  96*  --   --  93*  CREATININE 1.71*  --  1.88*  --  2.60*  --   --  6.78*  --   --  6.81*  CALCIUM 7.9*  --  8.6*  --  8.1*  --   --  7.7*  --   --  7.8*  MG  --   --   --   --   --   --  2.3 2.7* 2.3 2.6*  --   PHOS  --   --   --   --   --   --  3.9 7.0* 7.0* 7.2* 7.3*   < > = values in this interval not displayed.   GFR Estimated Creatinine Clearance: 3.7 mL/min (A) (by C-G formula based on SCr of 6.81 mg/dL (H)). Liver Function Tests: Recent Labs  Lab  08/14/19 1831 08/16/19 2029 08/19/19 0406 08/21/19 1400  AST 39 48*  --   --   ALT 28 31  --   --   ALKPHOS 161* 155*  --   --   BILITOT 0.5 0.4  --   --   PROT 6.9 7.8  --   --   ALBUMIN 3.5 4.2 3.4* 3.0*   No results for input(s): LIPASE, AMYLASE in the last 168 hours. Recent Labs  Lab 08/17/19 0243 08/18/19 0448 08/19/19 0406 08/20/19 0444 08/21/19 0447  AMMONIA 16 30 36* 25 23   Coagulation profile Recent Labs  Lab 08/16/19 2029  INR 0.9    CBC: Recent Labs  Lab 08/14/19 1831 08/16/19 2029 08/17/19 0243 08/19/19 0406 08/21/19 1400  WBC 5.2 6.4 6.6 11.3* 8.2  NEUTROABS 4.1 5.1  --   --   --   HGB 12.3 12.1 10.2* 9.6* 10.7*  HCT 40.0 38.0 31.8* 29.4* 32.7*  MCV 85.8 82.4 82.2 82.8 82.2  PLT 168 215 176 198 219   Cardiac Enzymes: No results for input(s): CKTOTAL, CKMB, CKMBINDEX, TROPONINI in the last 168 hours. BNP (last 3 results) No results for input(s): PROBNP in the last 8760 hours. CBG: Recent Labs  Lab 08/20/19 1953 08/20/19 2351 08/21/19 0346 08/21/19 0734 08/21/19 1143  GLUCAP 107* 138* 126* 160* 158*   D-Dimer: No results for input(s): DDIMER in the last 72 hours. Hgb A1c: No results for input(s): HGBA1C in the last 72 hours. Lipid Profile: No results for input(s): CHOL, HDL, LDLCALC, TRIG, CHOLHDL, LDLDIRECT in the last 72 hours. Thyroid function studies: No results for input(s): TSH, T4TOTAL, T3FREE, THYROIDAB in the last 72 hours.  Invalid input(s): FREET3 Anemia work up: No results for input(s): VITAMINB12, FOLATE, FERRITIN, TIBC, IRON, RETICCTPCT in the last 72 hours. Sepsis Labs: Recent Labs  Lab 08/16/19 2029 08/16/19 2255 08/17/19 0243 08/19/19 0406  08/21/19 1400  WBC 6.4  --  6.6 11.3* 8.2  LATICACIDVEN 2.0* 1.7  --   --   --     Microbiology Recent Results (from the past 240 hour(s))  Urine culture     Status: None   Collection Time: 08/16/19  8:23 PM   Specimen: In/Out Cath Urine  Result Value Ref Range  Status   Specimen Description   Final    IN/OUT CATH URINE Performed at [facility], [address], [place], [place] [address]    Special Requests   Final    NONE Performed at [facility], [address] [place], [place] [address]    Culture   Final    NO GROWTH Performed at [facility], [address], [place], [place] [address]    Report Status 08/18/2019 FINAL  Final  Blood Culture (routine x 2)     Status: None   Collection Time: 08/16/19  8:29 PM   Specimen: BLOOD  Result Value Ref Range Status   Specimen Description BLOOD BLOOD LEFT FOREARM  Final   Special Requests   Final    BOTTLES DRAWN AEROBIC AND ANAEROBIC Blood Culture adequate volume   Culture   Final    NO GROWTH 5 DAYS Performed at [facility], [address] [place], [place] [address]    Report Status 08/21/2019 FINAL  Final  Blood Culture (routine x 2)     Status: None   Collection Time: 08/16/19  8:29 PM   Specimen: BLOOD  Result Value Ref Range Status   Specimen Description BLOOD BLOOD LEFT ARM  Final   Special Requests   Final    BOTTLES DRAWN AEROBIC AND ANAEROBIC Blood Culture results may not be optimal due to an inadequate volume of blood received in culture bottles   Culture   Final    NO GROWTH 5 DAYS Performed at [facility], [address] [place], [place] [address]    Report Status 08/21/2019 FINAL  Final  Respiratory Panel by RT PCR (Flu A&B, Covid) - Urine, Clean Catch     Status: None   Collection Time: 08/16/19  8:47 PM   Specimen: Urine, Clean Catch  Result Value Ref Range Status   SARS Coronavirus 2 by RT PCR NEGATIVE NEGATIVE Final    Comment: (NOTE) SARS-CoV-2 target nucleic acids are NOT DETECTED. The SARS-CoV-2 RNA is generally detectable in upper respiratoy specimens during the acute phase of infection. The lowest concentration of SARS-CoV-2 viral copies this assay can detect is 131 copies/mL. A negative result does not  preclude SARS-Cov-2 infection and should not be used as the sole basis for treatment or other patient management decisions. A negative result may occur with  improper specimen collection/handling, submission of specimen other than nasopharyngeal swab, presence of viral mutation(s) within the areas targeted by this assay, and inadequate number of viral copies (<131 copies/mL). A negative result must be combined with clinical observations, patient history, and epidemiological information. The expected result is Negative. Fact Sheet for Patients:  PinkCheek.be Fact Sheet for Healthcare Providers:  GravelBags.it This test is not yet ap proved or cleared by the [place] FDA and  has been authorized for detection and/or diagnosis of SARS-CoV-2 by FDA under an Emergency Use Authorization (EUA). This EUA will remain  in effect (meaning this test can be used) for the duration of the COVID-19 declaration under Section 564(b)(1) of the Act, 21 U.S.C. section 360bbb-3(b)(1), unless the authorization is  terminated or revoked sooner.    Influenza A by PCR NEGATIVE NEGATIVE Final   Influenza B by PCR NEGATIVE NEGATIVE Final    Comment: (NOTE) The Xpert Xpress SARS-CoV-2/FLU/RSV assay is intended as an aid in  the diagnosis of influenza from Nasopharyngeal swab specimens and  should not be used as a sole basis for treatment. Nasal washings and  aspirates are unacceptable for Xpert Xpress SARS-CoV-2/FLU/RSV  testing. Fact Sheet for Patients: PinkCheek.be Fact Sheet for Healthcare Providers: GravelBags.it This test is not yet approved or cleared by the [place] FDA and  has been authorized for detection and/or diagnosis of SARS-CoV-2 by  FDA under an Emergency Use Authorization (EUA). This EUA will remain  in effect (meaning this test can be used) for the duration of the    Covid-19 declaration under Section 564(b)(1) of the Act, 21  U.S.C. section 360bbb-3(b)(1), unless the authorization is  terminated or revoked. Performed at [facility], [address] [place], [place] [address]     Procedures and diagnostic studies:  EEG  Result Date: 08/20/2019 Alexis Goodell, MD     08/20/2019  4:59 PM ELECTROENCEPHALOGRAM REPORT Patient: Mykela Mewborn       Room #: TM22Q-JF EEG No. ID: [id number] Age: 69 y.o.        Sex: female Requesting Physician: Mal Misty Report Date:  08/20/2019       Interpreting Physician: Alexis Goodell History: Amely Voorheis is an 69 y.o. female with altered mental status Medications: Norvasc, Rocephin, Catapres, Lasix, Insulin Conditions of Recording:  This is a 21 channel routine scalp EEG performed with bipolar and monopolar montages arranged in accordance to the international 10/20 system of electrode placement. One channel was dedicated to EKG recording. The patient is in the poorly responsive state. Description:  The background activity is slow and poorly organized diffusely with frequent, high voltage, generalized, intermittent periodic discharges of triphasic morphology.  These are continuous throughout the recording and occur at a frequency of approximately 2-3 Hz.  There is no change in the background rhythm with stimulation.  Hyperventilation and intermittent photic stimulation were not performed. IMPRESSION: This is a markedly abnormal electroencephalogram due to general background slowing and frequent, generalized triphasic waves.  This finding is consistent with a severe encephalopathic state, etiology nonspecific. Alexis Goodell, MD Neurology [phone] 08/20/2019, 4:02 PM   MR BRAIN WO CONTRAST  Result Date: 08/19/2019 CLINICAL DATA:  In stage renal disease, diabetes and hypertension. Altered mental status. EXAM: MRI HEAD WITHOUT CONTRAST TECHNIQUE: Multiplanar, multiecho pulse sequences of the  brain and surrounding structures were obtained without intravenous contrast. COMPARISON:  Head CT 3 days ago FINDINGS: Brain: Diffusion imaging does not show any acute or subacute infarction. No focal abnormality affects the brainstem or cerebellum. Cerebral hemispheres show old small vessel infarction in the left basal ganglia and mild to moderate chronic small-vessel ischemic changes of the cerebral hemispheric white matter. There is an old right parietal cortical and subcortical infarction. No mass lesion, hemorrhage, hydrocephalus or extra-axial collection. Vascular: Major vessels at the base of the brain show flow. Skull and upper cervical spine: Negative Sinuses/Orbits: Mild mucosal inflammatory changes of the sinuses. Bilateral mastoid effusions. Orbits negative. Other: None IMPRESSION: No acute finding. 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 Moderate chronic small-vessel ischemic changes of the hemispheric white matter. Electronically Signed   By: Nelson Chimes M.D.   On: 08/19/2019 17:08   DG Abd Portable 1V  Result  Date: 08/19/2019 CLINICAL DATA:  NG tube placement. EXAM: PORTABLE ABDOMEN - 1 VIEW COMPARISON:  None. FINDINGS: Tip and side port of the enteric tube below the diaphragm in the stomach. Right-sided dialysis catheter is partially included. Heart is enlarged. Normal bowel gas pattern in the upper abdomen. IMPRESSION: Tip and side port of the enteric tube below the diaphragm in the stomach. Electronically Signed   By: Keith Rake M.D.   On: 08/19/2019 18:45    Medications:   . amLODipine  10 mg Oral Daily  . B-complex with vitamin C  1 tablet Per Tube QHS  . Chlorhexidine Gluconate Cloth  6 each Topical Daily  . cloNIDine  0.2 mg Transdermal Weekly  . feeding supplement (JEVITY 1.5 CAL/FIBER)  1,000 mL Oral Q24H  . furosemide  80 mg Oral QODAY  . heparin  5,000 Units Subcutaneous Q12H  . insulin aspart  0-6 Units Subcutaneous  Q4H   Continuous Infusions:    LOS: 5 days   Zabria Liss  [facility]     08/21/2019, 4:58 PM

## 2019-08-21 NOTE — Consult Note (Signed)
PHARMACY CONSULT NOTE - FOLLOW UP  Pharmacy Consult for Electrolyte Monitoring and Replacement   Recent Labs: Potassium (mmol/L)  Date Value  08/21/2019 3.5   Magnesium (mg/dL)  Date Value  08/21/2019 2.6 (H)   Calcium (mg/dL)  Date Value  08/21/2019 7.8 (L)   Calcium, Total (PTH) (mg/dL)  Date Value  03/22/2019 6.3   Albumin (g/dL)  Date Value  08/21/2019 3.0 (L)   Phosphorus (mg/dL)  Date Value  08/21/2019 7.3 (H)   Sodium (mmol/L)  Date Value  08/21/2019 145     Assessment: Pharmacy is consulted to manage electrolytes in this 69 year old female admitted with ESRD on hemodialysis, as well as encephalopathy.  PMH is significant for DM and hypertension, and she encompasses a very small body habitus.  Patient currently receives furosemide 80 mg every other day.  Goal of Therapy:  Electrolytes WNL  Plan:  No replacement warranted at this time, but patient may be at risk for refeeding syndrome.  Will defer to nephrology for electrolyte replacement in the setting of hemodialysis.  Will obtain AM labs.    Gerald Dexter ,PharmD 08/21/2019 4:42 PM

## 2019-08-21 NOTE — Progress Notes (Addendum)
Subjective: Patient somewhat improved today although she remains altered.    Objective: Current vital signs: BP (!) 148/68   Pulse 85   Temp 99.2 F (37.3 C) (Axillary)   Resp 17   Ht 4\' 11"  (1.499 m)   Wt 30.6 kg   SpO2 97%   BMI 13.63 kg/m  Vital signs in last 24 hours: Temp:  [98.2 F (36.8 C)-99.6 F (37.6 C)] 99.2 F (37.3 C) (04/29 1130) Pulse Rate:  [85-99] 85 (04/29 1100) Resp:  [0-24] 17 (04/29 1100) BP: (123-170)/(57-73) 148/68 (04/29 1100) SpO2:  [96 %-100 %] 97 % (04/29 1130) Weight:  [30.6 kg] 30.6 kg (04/29 0500)  Intake/Output from previous day: 04/28 0701 - 04/29 0700 In: 200 [NG/GT:200] Out: 0  Intake/Output this shift: Total I/O In: 100 [IV Piggyback:100] Out: -  Nutritional status:  Diet Order            Diet NPO time specified  Diet effective now              Neurologic Exam: Mental Status: Lethargic but opens eyes when name called.  No speech.  Does not follow commands.  Attends to the right Cranial Nerves: II: Does not blink to bilateral confrontation.   III,IV, VI: Intact oculocephalic maneuvers V,VII: corneals intact bilaterally VIII: unable to test IX,X: unable to test XI: unable to test XII: unable to test Motor: All extremities drop to the bed when lifted but trace movement noted in all extremities Sensory: Responds to noxious stimuli bilaterally  Lab Results: Basic Metabolic Panel: Recent Labs  Lab 08/14/19 1831 08/14/19 1831 08/16/19 2029 08/17/19 0243 08/17/19 0656 08/19/19 0406 08/20/19 1415 08/21/19 0447  NA 130*  --  134* 134*  --  142  --   --   K 3.5  --  3.8 3.9  --  4.3  --   --   CL 92*  --  92* 93*  --  104  --   --   CO2 26  --  32 29  --  17*  --   --   GLUCOSE 236*  --  224* 260*  --  131*  --   --   BUN 9  --  14 24*  --  96*  --   --   CREATININE 1.71*  --  1.88* 2.60*  --  6.78*  --   --   CALCIUM 7.9*   < > 8.6* 8.1*  --  7.7*  --   --   MG  --   --   --   --  2.3 2.7* 2.3 2.6*  PHOS  --   --    --   --  3.9 7.0* 7.0* 7.2*   < > = values in this interval not displayed.    Liver Function Tests: Recent Labs  Lab 08/14/19 1831 08/16/19 2029 08/19/19 0406  AST 39 48*  --   ALT 28 31  --   ALKPHOS 161* 155*  --   BILITOT 0.5 0.4  --   PROT 6.9 7.8  --   ALBUMIN 3.5 4.2 3.4*   No results for input(s): LIPASE, AMYLASE in the last 168 hours. Recent Labs  Lab 08/19/19 0406 08/20/19 0444 08/21/19 0447  AMMONIA 36* 25 23    CBC: Recent Labs  Lab 08/14/19 1831 08/16/19 2029 08/17/19 0243 08/19/19 0406  WBC 5.2 6.4 6.6 11.3*  NEUTROABS 4.1 5.1  --   --   HGB 12.3 12.1 10.2*  9.6*  HCT 40.0 38.0 31.8* 29.4*  MCV 85.8 82.4 82.2 82.8  PLT 168 215 176 198    Cardiac Enzymes: No results for input(s): CKTOTAL, CKMB, CKMBINDEX, TROPONINI in the last 168 hours.  Lipid Panel: No results for input(s): CHOL, TRIG, HDL, CHOLHDL, VLDL, LDLCALC in the last 168 hours.  CBG: Recent Labs  Lab 08/20/19 1953 08/20/19 2351 08/21/19 0346 08/21/19 0734 08/21/19 1143  GLUCAP 107* 138* 126* 160* 158*    Microbiology: Results for orders placed or performed during the hospital encounter of 08/16/19  Urine culture     Status: None   Collection Time: 08/16/19  8:23 PM   Specimen: In/Out Cath Urine  Result Value Ref Range Status   Specimen Description   Final    IN/OUT CATH URINE Performed at [facility], [address] [place], [place] [address]    Special Requests   Final    NONE Performed at [facility], [address], [place], [place] [address]    Culture   Final    NO GROWTH Performed at [facility], [address], [place], [place] [address]    Report Status 08/18/2019 FINAL  Final  Blood Culture (routine x 2)     Status: None   Collection Time: 08/16/19  8:29 PM   Specimen: BLOOD  Result Value Ref Range Status   Specimen Description BLOOD BLOOD LEFT FOREARM  Final   Special Requests   Final    BOTTLES DRAWN AEROBIC AND ANAEROBIC  Blood Culture adequate volume   Culture   Final    NO GROWTH 5 DAYS Performed at [facility], [address] [place], [place] [address]    Report Status 08/21/2019 FINAL  Final  Blood Culture (routine x 2)     Status: None   Collection Time: 08/16/19  8:29 PM   Specimen: BLOOD  Result Value Ref Range Status   Specimen Description BLOOD BLOOD LEFT ARM  Final   Special Requests   Final    BOTTLES DRAWN AEROBIC AND ANAEROBIC Blood Culture results may not be optimal due to an inadequate volume of blood received in culture bottles   Culture   Final    NO GROWTH 5 DAYS Performed at [facility], [address] [place],  [address]    Report Status 08/21/2019 FINAL  Final  Respiratory Panel by RT PCR (Flu A&B, Covid) - Urine, Clean Catch     Status: None   Collection Time: 08/16/19  8:47 PM   Specimen: Urine, Clean Catch  Result Value Ref Range Status   SARS Coronavirus 2 by RT PCR NEGATIVE NEGATIVE Final    Comment: (NOTE) SARS-CoV-2 target nucleic acids are NOT DETECTED. The SARS-CoV-2 RNA is generally detectable in upper respiratoy specimens during the acute phase of infection. The lowest concentration of SARS-CoV-2 viral copies this assay can detect is 131 copies/mL. A negative result does not preclude SARS-Cov-2 infection and should not be used as the sole basis for treatment or other patient management decisions. A negative result may occur with  improper specimen collection/handling, submission of specimen other than nasopharyngeal swab, presence of viral mutation(s) within the areas targeted by this assay, and inadequate number of viral copies (<131 copies/mL). A negative result must be combined with clinical observations, patient history, and epidemiological information. The expected result is Negative. Fact Sheet for Patients:  PinkCheek.be Fact Sheet for Healthcare Providers:   GravelBags.it This test is not yet ap proved or  cleared by the [place] and  has been authorized for detection and/or diagnosis of SARS-CoV-2 by FDA under an Emergency Use Authorization (EUA). This EUA will remain  in effect (meaning this test can be used) for the duration of the COVID-19 declaration under Section 564(b)(1) of the Act, 21 U.S.C. section 360bbb-3(b)(1), unless the authorization is terminated or revoked sooner.    Influenza A by PCR NEGATIVE NEGATIVE Final   Influenza B by PCR NEGATIVE NEGATIVE Final    Comment: (NOTE) The Xpert Xpress SARS-CoV-2/FLU/RSV assay is intended as an aid in  the diagnosis of influenza from Nasopharyngeal swab specimens and  should not be used as a sole basis for treatment. Nasal washings and  aspirates are unacceptable for Xpert Xpress SARS-CoV-2/FLU/RSV  testing. Fact Sheet for Patients: PinkCheek.be Fact Sheet for Healthcare Providers: GravelBags.it This test is not yet approved or cleared by the [place] FDA and  has been authorized for detection and/or diagnosis of SARS-CoV-2 by  FDA under an Emergency Use Authorization (EUA). This EUA will remain  in effect (meaning this test can be used) for the duration of the  Covid-19 declaration under Section 564(b)(1) of the Act, 21  U.S.C. section 360bbb-3(b)(1), unless the authorization is  terminated or revoked. Performed at [facility], [address] [place], [place] [address]     Coagulation Studies: No results for input(s): LABPROT, INR in the last 72 hours.  Imaging: EEG  Result Date: 08/20/2019 Alexis Goodell, MD     08/20/2019  4:59 PM ELECTROENCEPHALOGRAM REPORT Patient: Tacie Mccuistion       Room #: IC20A-AA EEG No. ID: [id number] Age: 69 y.o.        Sex: female Requesting Physician: Mal Misty Report Date:  08/20/2019       Interpreting Physician: Alexis Goodell History: Catera Hankins is an 69 y.o. female with altered mental status Medications: Norvasc, Rocephin, Catapres, Lasix, Insulin Conditions of Recording:  This is a 21 channel routine scalp EEG performed with bipolar and monopolar montages arranged in accordance to the international 10/20 system of electrode placement. One channel was dedicated to EKG recording. The patient is in the poorly responsive state. Description:  The background activity is slow and poorly organized diffusely with frequent, high voltage, generalized, intermittent periodic discharges of triphasic morphology.  These are continuous throughout the recording and occur at a frequency of approximately 2-3 Hz.  There is no change in the background rhythm with stimulation.  Hyperventilation and intermittent photic stimulation were not performed. IMPRESSION: This is a markedly abnormal electroencephalogram due to general background slowing and frequent, generalized triphasic waves.  This finding is consistent with a severe encephalopathic state, etiology nonspecific. Alexis Goodell, MD Neurology [phone] 08/20/2019, 4:02 PM   MR BRAIN WO CONTRAST  Result Date: 08/19/2019 CLINICAL DATA:  In stage renal disease, diabetes and hypertension. Altered mental status. EXAM: MRI HEAD WITHOUT CONTRAST TECHNIQUE: Multiplanar, multiecho pulse sequences of the brain and surrounding structures were obtained without intravenous contrast. COMPARISON:  Head CT 3 days ago FINDINGS: Brain: Diffusion imaging does not show any acute or subacute infarction. No focal abnormality affects the brainstem or cerebellum. Cerebral hemispheres show old small vessel infarction in the left basal ganglia and mild to moderate chronic small-vessel ischemic changes of the cerebral hemispheric white matter. There is an old right parietal cortical and subcortical infarction. No mass lesion, hemorrhage, hydrocephalus or extra-axial collection. Vascular: Major  vessels at the base of the brain show flow. Skull  and upper cervical spine: Negative Sinuses/Orbits: Mild mucosal inflammatory changes of the sinuses. Bilateral mastoid effusions. Orbits negative. Other: None IMPRESSION: No acute finding. 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 Moderate chronic small-vessel ischemic changes of the hemispheric white matter. Electronically Signed   By: Nelson Chimes M.D.   On: 08/19/2019 17:08   DG Abd Portable 1V  Result Date: 08/19/2019 CLINICAL DATA:  NG tube placement. EXAM: PORTABLE ABDOMEN - 1 VIEW COMPARISON:  None. FINDINGS: Tip and side port of the enteric tube below the diaphragm in the stomach. Right-sided dialysis catheter is partially included. Heart is enlarged. Normal bowel gas pattern in the upper abdomen. IMPRESSION: Tip and side port of the enteric tube below the diaphragm in the stomach. Electronically Signed   By: Keith Rake M.D.   On: 08/19/2019 18:45    Medications:  I have reviewed the patient's current medications. Scheduled: . amLODipine  10 mg Oral Daily  . B-complex with vitamin C  1 tablet Per Tube QHS  . Chlorhexidine Gluconate Cloth  6 each Topical Daily  . cloNIDine  0.2 mg Transdermal Weekly  . feeding supplement (JEVITY 1.5 CAL/FIBER)  1,000 mL Oral Q24H  . furosemide  80 mg Oral QODAY  . heparin  5,000 Units Subcutaneous Q12H  . insulin aspart  0-6 Units Subcutaneous Q4H    Assessment/Plan: 69 y.o.femalewith a known history of type 2 diabetes mellitus, hypertension and chronic kidney disease, who presented to the emergency room with acute onset of altered mental status with confusion and decreased responsiveness with associated elevated blood pressurealong with fever.  Etiology of fever unclear.  Depressed mental status felt to be secondary to metabolic encephalopathy.  Last BUN/Cr well above baseline.  Phosphorus elevated.  Neurological examination less focal today and  somewhat improved.  MRI of the brain personally reviewed and shows no acute changes.  EEG consistent with a severe encephalopathy.   Family conference held today with interpreter and condition/prognosis of patient discussed (30 minutes).  Due to improvement noted and results of EEG will hold off on LP at this time since mental status likely secondary to a metabolic encephalopathy.  Family is in agreement.     Recommendations: 1. Will continue to follow with you.    Spanish interpreter present for this evaluation   LOS: 5 days   Alexis Goodell, MD Neurology [phone] 08/21/2019  12:56 PM

## 2019-08-21 NOTE — Progress Notes (Signed)
   08/21/19 1830  Hand-Off documentation  Handoff Given Given to shift RN/LPN  Report given to (Full Name) Saundra Shelling RN  Handoff Received Received from shift RN/LPN  Report received from (Full Name) Sherren Mocha RN  Vital Signs  Pulse Rate 98  Pulse Rate Source Monitor  Resp (!) 28  BP (!) 184/76  BP Location Left Arm  BP Method Automatic  Patient Position (if appropriate) Lying  Oxygen Therapy  SpO2 100 %  O2 Device Room Air  Pain Assessment  Pain Scale 0-10  Pain Score 0  Post-Hemodialysis Assessment  Rinseback Volume (mL) 250 mL  KECN 49.8 V  Dialyzer Clearance Clear  Duration of HD Treatment -hour(s) 3 hour(s)  Hemodialysis Intake (mL) 500 mL  UF Total -Machine (mL) 1000 mL  Net UF (mL) 500 mL  Tolerated HD Treatment Yes  Post-Hemodialysis Comments tolerated well  AVG/AVF Arterial Site Held (minutes) 10 minutes  AVG/AVF Venous Site Held (minutes) 10 minutes  Education / Care Plan  Dialysis Education Provided Yes  Note  Observations no problems toi note  Fistula / Graft Right Upper arm Arteriovenous fistula  Placement Date/Time: 04/24/19 1322   Orientation: Right  Access Location: Upper arm  Access Type: Arteriovenous fistula  Site Condition No complications  Fistula / Graft Assessment Present;Thrill;Bruit  Status Deaccessed  Needle Size 16  Drainage Description None  TOLERATED TREATMENT NO S/S OF DISTRESS 500ML REMOVED

## 2019-08-21 NOTE — Progress Notes (Signed)
Unfortunately at this time [facility] is not able to accept patient due to family not being able to provide proof of residency. Spoke with Melissa, patient daughter-in-law about this and the option of transferring to the [facility] in [place]. Lenna Sciara is in agreement to transfer to [facility] since [facility] is already in the process of getting Emergency Medicaid approved. Requested transfer today.

## 2019-08-21 NOTE — Progress Notes (Signed)
Nutrition Follow-up  DOCUMENTATION CODES:   Severe malnutrition in context of chronic illness, Underweight  INTERVENTION:  Continue advancing Jevity 1.5 Cal by 10 mL/hr every 12 hours to goal rate of 40 mL/hr. Goal regimen provides 1440 kcal, 61 grams of protein, 730 mL H2O daily.  Provide minimum free water flush of 20-30 mL Q4hrs to maintain tube patency.  Continue B-complex with C QHS per tube.  Monitor magnesium, potassium, and phosphorus daily for at least 3 days, MD to replete as needed, as pt is at risk for refeeding syndrome given severe chronic malnutrition.  NUTRITION DIAGNOSIS:   Severe Malnutrition related to chronic illness(ESRD on HD) as evidenced by severe fat depletion, severe muscle depletion.  New nutrition diagnosis.  GOAL:   Patient will meet greater than or equal to 90% of their needs  Progressing with advancement of tube feeds.  MONITOR:   Labs, Weight trends, TF tolerance, I & O's  REASON FOR ASSESSMENT:   Consult Enteral/tube feeding initiation and management  ASSESSMENT:   69 year old female with PMHx of HTN, DM, ESRD on HD admitted with acute metabolic encephalopathy, hypertensive emergency.  Patient was awake this morning but not responsive. No family members at bedside. Discussed with RN. Patient is currently up to 20 mL/hr on tube feeding with plan to advance to 30 mL/hr at approximately 1600. Plan is for another family meeting today.  Medications reviewed and include: B-complex with C QHS per tube, Lasix 80 mg every other day, Novolog 0-6 units Q4hrs.  Labs reviewed: CBG 107-160, Phosphorus 7.2, Magnesium 2.6.  Enteral Access: 18 Fr. NGT placed 4/27; terminates in stomach per abdominal x-ray 4/27  I/O: pt anuric  NUTRITION - FOCUSED PHYSICAL EXAM:    Most Recent Value  Orbital Region  Severe depletion  Upper Arm Region  Severe depletion  Thoracic and Lumbar Region  Severe depletion  Buccal Region  Severe depletion  Temple Region   Severe depletion  Clavicle Bone Region  Severe depletion  Clavicle and Acromion Bone Region  Severe depletion  Scapular Bone Region  Unable to assess  Dorsal Hand  Severe depletion  Patellar Region  Severe depletion  Anterior Thigh Region  Severe depletion  Posterior Calf Region  Severe depletion  Edema (RD Assessment)  -- [nonpitting to bilateral upper extremities]  Hair  Reviewed  Eyes  Reviewed  Mouth  Unable to assess  Skin  Reviewed  Nails  Reviewed     Diet Order:   Diet Order            Diet NPO time specified  Diet effective now             EDUCATION NEEDS:   No education needs have been identified at this time  Skin:  Skin Assessment: Reviewed RN Assessment  Last BM:  08/20/2019 - small type 6  Height:   Ht Readings from Last 1 Encounters:  08/16/19 4\' 11"  (1.499 m)   Weight:   Wt Readings from Last 1 Encounters:  08/21/19 30.6 kg   Ideal Body Weight:  43.2 kg  BMI:  Body mass index is 13.63 kg/m.  Estimated Nutritional Needs:   Kcal:  1200-1400  Protein:  60-70 grams  Fluid:  UOP + 1 L  Jacklynn Barnacle, MS, RD, LDN Pager number available on Amion

## 2019-08-22 DIAGNOSIS — E43 Unspecified severe protein-calorie malnutrition: Secondary | ICD-10-CM | POA: Insufficient documentation

## 2019-08-22 LAB — BASIC METABOLIC PANEL
Anion gap: 10 (ref 5–15)
BUN: 36 mg/dL — ABNORMAL HIGH (ref 8–23)
CO2: 32 mmol/L (ref 22–32)
Calcium: 7.9 mg/dL — ABNORMAL LOW (ref 8.9–10.3)
Chloride: 97 mmol/L — ABNORMAL LOW (ref 98–111)
Creatinine, Ser: 3.23 mg/dL — ABNORMAL HIGH (ref 0.44–1.00)
GFR calc Af Amer: 16 mL/min — ABNORMAL LOW (ref >60)
GFR calc non Af Amer: 14 mL/min — ABNORMAL LOW (ref >60)
Glucose, Bld: 267 mg/dL — ABNORMAL HIGH (ref 70–99)
Potassium: 4.5 mmol/L (ref 3.5–5.1)
Sodium: 139 mmol/L (ref 135–145)

## 2019-08-22 LAB — GLUCOSE, CAPILLARY
Glucose-Capillary: 153 mg/dL — ABNORMAL HIGH (ref 70–99)
Glucose-Capillary: 159 mg/dL — ABNORMAL HIGH (ref 70–99)
Glucose-Capillary: 161 mg/dL — ABNORMAL HIGH (ref 70–99)
Glucose-Capillary: 186 mg/dL — ABNORMAL HIGH (ref 70–99)
Glucose-Capillary: 223 mg/dL — ABNORMAL HIGH (ref 70–99)
Glucose-Capillary: 226 mg/dL — ABNORMAL HIGH (ref 70–99)
Glucose-Capillary: 227 mg/dL — ABNORMAL HIGH (ref 70–99)
Glucose-Capillary: 246 mg/dL — ABNORMAL HIGH (ref 70–99)

## 2019-08-22 LAB — MAGNESIUM: Magnesium: 2 mg/dL (ref 1.7–2.4)

## 2019-08-22 LAB — PHOSPHORUS: Phosphorus: 4.3 mg/dL (ref 2.5–4.6)

## 2019-08-22 LAB — AMMONIA: Ammonia: 32 umol/L (ref 9–35)

## 2019-08-22 MED ORDER — INSULIN GLARGINE 100 UNIT/ML ~~LOC~~ SOLN
10.0000 [IU] | Freq: Every day | SUBCUTANEOUS | Status: DC
  Administered 2019-08-22 – 2019-08-25 (×4): 10 [IU] via SUBCUTANEOUS
  Filled 2019-08-22 (×5): qty 0.1

## 2019-08-22 MED ORDER — CHLORHEXIDINE GLUCONATE 0.12 % MT SOLN
15.0000 mL | Freq: Two times a day (BID) | OROMUCOSAL | Status: DC
  Administered 2019-08-22 – 2019-08-27 (×12): 15 mL via OROMUCOSAL
  Filled 2019-08-22 (×10): qty 15

## 2019-08-22 MED ORDER — ORAL CARE MOUTH RINSE
15.0000 mL | Freq: Two times a day (BID) | OROMUCOSAL | Status: DC
  Administered 2019-08-22 – 2019-08-27 (×11): 15 mL via OROMUCOSAL

## 2019-08-22 NOTE — Progress Notes (Signed)
Pt responds to voice this shift. Son is able to interact with the pt. Son asks questions and pt nods or shakes head. Says one to two word responses. VSS. Antihypertensives held for BP on soft side. Tolerating NG tube feeds. Will continue to monitor.

## 2019-08-22 NOTE — Progress Notes (Signed)
Progress Note    Amber Dixon  ZOX:[id number] DOB: 1951/01/27  DOA: 08/16/2019 PCP: Patient, No Pcp Per      Brief Narrative:    Medical records reviewed and are as summarized below:        Assessment/Plan:   Active Problems:   Encephalopathy   Altered mental status   DNR (do not resuscitate) discussion   Palliative care by specialist   Protein-calorie malnutrition, severe   SIRS: No clear source of infection so far.  Antibiotics have been discontinued  Acute toxic metabolic encephalopathy: Patient is still encephalopathic.  Work-up including CT head, MRI brain, TFT, ammonia and cultures unrevealing.  No lumbar puncture for now per discussion between family and neurologist.  Hypertension/ s/p hypertensive emergency: BP has improved and is now borderline low.  Amlodipine has been held because of this.  Continue clonidine patch.  Type 2 diabetes mellitus with hyperglycemia: This is likely from enteral nutrition.  Start low-dose Lantus.  Continue NovoLog as needed.  ESRD/bone marrow disorder: On HD TTS. Follow-up with nephrologist  Anemia of chronic disease: H&H stable. Monitor H&H  Underweight/severe protein calorie malnutrition:Body mass index is 14.96 kg/m.   NG tube was placed on 08/20/2019 and she has been started on enteral nutrition.  Body mass index is 13.18 kg/m.  (Underweight)        Family Communication/Anticipated D/C date and plan/Code Status   DVT prophylaxis: Heparin Code Status: Full code Family Communication: Plan discussed with Melissa, her daughter-in-law. Disposition Plan:    Status is: Inpatient  Remains inpatient appropriate because:Inpatient level of care appropriate due to severity of illness   Dispo: The patient is from: Home              Anticipated d/c is to: SNF              Anticipated d/c date is: 3 days              Patient currently is not medically stable to  d/c.            Subjective:   Patient is still lethargic and unable to provide any history  Objective:    Vitals:   08/20/19 1600 08/20/19 1900 08/20/19 2000 08/20/19 2100  BP: 131/62 (!) 145/65 (!) 160/62 (!) 151/65  Pulse: 97 99 98 93  Resp: 17 20 18 18   Temp:  98.4 F (36.9 C)    TempSrc:  Oral    SpO2: 97% 98% 97% 100%  Weight:      Height:       No data found.   Intake/Output Summary (Last 24 hours) at 08/22/2019 1641 Last data filed at 08/22/2019 0726 Gross per 24 hour  Intake 300 ml  Output 500 ml  Net -200 ml   Filed Weights   08/17/19 0624 08/21/19 0500 08/22/19 0453  Weight: 33.6 kg 30.6 kg 29.6 kg    Exam:  GEN: NAD SKIN: Warm and dry EYES: No pallor or icterus ENT: MMM CV: RRR PULM: CTA B ABD: soft, ND, NT, +BS CNS: Lethargic but responds to painful stimuli/sternal rub.  All 4 extremities were weak/flaccid. EXT: No edema    Data Reviewed:   I have personally reviewed following labs and imaging studies:  Labs: Labs show the following:   Basic Metabolic Panel: Recent Labs  Lab  0000 08/16/19 2029 08/16/19 2029 08/17/19 0243 08/17/19 8119 08/17/19 1478 08/19/19 0406 08/19/19 0406 08/20/19 1415 08/21/19 0447 08/21/19 1400 08/22/19 0423  NA  --  134*  --  134*  --   --  142  --   --   --  145 139  K   < > 3.8   < > 3.9  --   --  4.3   < >  --   --  3.5 4.5  CL  --  92*  --  93*  --   --  104  --   --   --  100 97*  CO2  --  32  --  29  --   --  17*  --   --   --  27 32  GLUCOSE  --  224*  --  260*  --   --  131*  --   --   --  185* 267*  BUN  --  14  --  24*  --   --  96*  --   --   --  93* 36*  CREATININE  --  1.88*  --  2.60*  --   --  6.78*  --   --   --  6.81* 3.23*  CALCIUM  --  8.6*  --  8.1*  --   --  7.7*  --   --   --  7.8* 7.9*  MG  --   --   --   --  2.3  --  2.7*  --  2.3 2.6*  --  2.0  PHOS  --   --   --   --  3.9   < > 7.0*  --  7.0* 7.2* 7.3* 4.3   < > = values in this interval not displayed.    GFR Estimated Creatinine Clearance: 7.6 mL/min (A) (by C-G formula based on SCr of 3.23 mg/dL (H)). Liver Function Tests: Recent Labs  Lab 08/16/19 2029 08/19/19 0406 08/21/19 1400  AST 48*  --   --   ALT 31  --   --   ALKPHOS 155*  --   --   BILITOT 0.4  --   --   PROT 7.8  --   --   ALBUMIN 4.2 3.4* 3.0*   No results for input(s): LIPASE, AMYLASE in the last 168 hours. Recent Labs  Lab 08/18/19 0448 08/19/19 0406 08/20/19 0444 08/21/19 0447 08/22/19 0423  AMMONIA 30 36* 25 23 32   Coagulation profile Recent Labs  Lab 08/16/19 2029  INR 0.9    CBC: Recent Labs  Lab 08/16/19 2029 08/17/19 0243 08/19/19 0406 08/21/19 1400  WBC 6.4 6.6 11.3* 8.2  NEUTROABS 5.1  --   --   --   HGB 12.1 10.2* 9.6* 10.7*  HCT 38.0 31.8* 29.4* 32.7*  MCV 82.4 82.2 82.8 82.2  PLT 215 176 198 219   Cardiac Enzymes: No results for input(s): CKTOTAL, CKMB, CKMBINDEX, TROPONINI in the last 168 hours. BNP (last 3 results) No results for input(s): PROBNP in the last 8760 hours. CBG: Recent Labs  Lab 08/22/19 0026 08/22/19 0429 08/22/19 0721 08/22/19 1132 08/22/19 1629  GLUCAP 186* 246* 159* 153* 226*   D-Dimer: No results for input(s): DDIMER in the last 72 hours. Hgb A1c: No results for input(s): HGBA1C in the last 72 hours. Lipid Profile: No results for input(s): CHOL, HDL, LDLCALC, TRIG, CHOLHDL, LDLDIRECT in the last 72 hours. Thyroid function studies: No results for input(s): TSH, T4TOTAL, T3FREE, THYROIDAB in the last 72 hours.  Invalid input(s): FREET3 Anemia work up: No results  for input(s): VITAMINB12, FOLATE, FERRITIN, TIBC, IRON, RETICCTPCT in the last 72 hours. Sepsis Labs: Recent Labs  Lab 08/16/19 2029 08/16/19 2255 08/17/19 0243 08/19/19 0406 08/21/19 1400  WBC 6.4  --  6.6 11.3* 8.2  LATICACIDVEN 2.0* 1.7  --   --   --     Microbiology Recent Results (from the past 240 hour(s))  Urine culture     Status: None   Collection Time: 08/16/19   8:23 PM   Specimen: In/Out Cath Urine  Result Value Ref Range Status   Specimen Description   Final    IN/OUT CATH URINE Performed at [facility], [address] [place], [place] [address]    Special Requests   Final    NONE Performed at [facility], [address] [place], [place] [address]    Culture   Final    NO GROWTH Performed at [facility], [place] [address], [place], [place] [address]    Report Status 08/18/2019 FINAL  Final  Blood Culture (routine x 2)     Status: None   Collection Time: 08/16/19  8:29 PM   Specimen: BLOOD  Result Value Ref Range Status   Specimen Description BLOOD BLOOD LEFT FOREARM  Final   Special Requests   Final    BOTTLES DRAWN AEROBIC AND ANAEROBIC Blood Culture adequate volume   Culture   Final    NO GROWTH 5 DAYS Performed at [facility], [place] [place], [place] [address]    Report Status 08/21/2019 FINAL  Final  Blood Culture (routine x 2)     Status: None   Collection Time: 08/16/19  8:29 PM   Specimen: BLOOD  Result Value Ref Range Status   Specimen Description BLOOD BLOOD LEFT ARM  Final   Special Requests   Final    BOTTLES DRAWN AEROBIC AND ANAEROBIC Blood Culture results may not be optimal due to an inadequate volume of blood received in culture bottles   Culture   Final    NO GROWTH 5 DAYS Performed at [facility], [address] [place], [place] [address]    Report Status 08/21/2019 FINAL  Final  Respiratory Panel by RT PCR (Flu A&B, Covid) - Urine, Clean Catch     Status: None   Collection Time: 08/16/19  8:47 PM   Specimen: Urine, Clean Catch  Result Value Ref Range Status   SARS Coronavirus 2 by RT PCR NEGATIVE NEGATIVE Final    Comment: (NOTE) SARS-CoV-2 target nucleic acids are NOT DETECTED. The SARS-CoV-2 RNA is generally detectable in upper respiratoy specimens during the acute phase of infection. The lowest concentration of SARS-CoV-2 viral copies this  assay can detect is 131 copies/mL. A negative result does not preclude SARS-Cov-2 infection and should not be used as the sole basis for treatment or other patient management decisions. A negative result may occur with  improper specimen collection/handling, submission of specimen other than nasopharyngeal swab, presence of viral mutation(s) within the areas targeted by this assay, and inadequate number of viral copies (<131 copies/mL). A negative result must be combined with clinical observations, patient history, and epidemiological information. The expected result is Negative. Fact Sheet for Patients:  PinkCheek.be Fact Sheet for Healthcare Providers:  GravelBags.it This test is not yet ap proved or cleared by the [place] FDA and  has been authorized for detection and/or diagnosis of SARS-CoV-2 by FDA under an Emergency Use Authorization (EUA). This EUA will remain  in effect (  meaning this test can be used) for the duration of the COVID-19 declaration under Section 564(b)(1) of the Act, 21 U.S.C. section 360bbb-3(b)(1), unless the authorization is terminated or revoked sooner.    Influenza A by PCR NEGATIVE NEGATIVE Final   Influenza B by PCR NEGATIVE NEGATIVE Final    Comment: (NOTE) The Xpert Xpress SARS-CoV-2/FLU/RSV assay is intended as an aid in  the diagnosis of influenza from Nasopharyngeal swab specimens and  should not be used as a sole basis for treatment. Nasal washings and  aspirates are unacceptable for Xpert Xpress SARS-CoV-2/FLU/RSV  testing. Fact Sheet for Patients: PinkCheek.be Fact Sheet for Healthcare Providers: GravelBags.it This test is not yet approved or cleared by the [place] FDA and  has been authorized for detection and/or diagnosis of SARS-CoV-2 by  FDA under an Emergency Use Authorization (EUA). This EUA will remain  in  effect (meaning this test can be used) for the duration of the  Covid-19 declaration under Section 564(b)(1) of the Act, 21  U.S.C. section 360bbb-3(b)(1), unless the authorization is  terminated or revoked. Performed at [facility], [address] [place], [place] [address]     Procedures and diagnostic studies:  No results found.  Medications:   . B-complex with vitamin C  1 tablet Per Tube QHS  . chlorhexidine  15 mL Mouth Rinse BID  . Chlorhexidine Gluconate Cloth  6 each Topical Daily  . cloNIDine  0.2 mg Transdermal Weekly  . feeding supplement (JEVITY 1.5 CAL/FIBER)  1,000 mL Oral Q24H  . furosemide  80 mg Oral QODAY  . heparin  5,000 Units Subcutaneous Q12H  . insulin aspart  0-6 Units Subcutaneous Q4H  . insulin glargine  10 Units Subcutaneous QHS  . mouth rinse  15 mL Mouth Rinse q12n4p   Continuous Infusions:    LOS: 6 days   Maribelle Hopple  [facility]     08/22/2019, 4:41 PM

## 2019-08-22 NOTE — Consult Note (Signed)
PHARMACY CONSULT NOTE - FOLLOW UP  Pharmacy Consult for Electrolyte Monitoring and Replacement   Recent Labs: Potassium (mmol/L)  Date Value  08/22/2019 4.5   Magnesium (mg/dL)  Date Value  08/22/2019 2.0   Calcium (mg/dL)  Date Value  08/22/2019 7.9 (L)   Calcium, Total (PTH) (mg/dL)  Date Value  03/22/2019 6.3   Albumin (g/dL)  Date Value  08/21/2019 3.0 (L)   Phosphorus (mg/dL)  Date Value  08/22/2019 4.3   Sodium (mmol/L)  Date Value  08/22/2019 139     Assessment: Pharmacy is consulted to manage electrolytes in this 69 year old female admitted with ESRD on hemodialysis, as well as encephalopathy.  PMH is significant for DM and hypertension, and she encompasses a very small body habitus.  Patient currently receives furosemide 80 mg every other day.  Goal of Therapy:  Electrolytes WNL  Plan:  No replacement needed for today.  Will continue to monitor electrolytes for refeeding risk.  Will continue to defer to nephrology for electrolyte replacement.  Will obtain AM labs  Gerald Dexter ,PharmD 08/22/2019 1:46 PM

## 2019-08-22 NOTE — Progress Notes (Signed)
Subjective: Patient not as responsive today.    Objective: Current vital signs: BP (!) 107/54   Pulse 80   Temp 98 F (36.7 C) (Axillary)   Resp 14   Ht 4\' 11"  (1.499 m)   Wt 29.6 kg   SpO2 99%   BMI 13.18 kg/m  Vital signs in last 24 hours: Temp:  [97.8 F (36.6 C)-99.2 F (37.3 C)] 98 F (36.7 C) (04/30 0200) Pulse Rate:  [80-100] 80 (04/30 0700) Resp:  [8-28] 14 (04/30 0700) BP: (78-184)/(43-76) 107/54 (04/30 0700) SpO2:  [97 %-100 %] 99 % (04/30 0700) Weight:  [29.6 kg] 29.6 kg (04/30 0453)  Intake/Output from previous day: 04/29 0701 - 04/30 0700 In: 400 [NG/GT:300; IV Piggyback:100] Out: 500  Intake/Output this shift: No intake/output data recorded. Nutritional status:  Diet Order            Diet NPO time specified  Diet effective now              Neurologic Exam: Mental Status: Patient does not respond to verbal stimuli.  Grimaces to deep sternal rub.  Does not follow commands.  No verbalizations noted.  Cranial Nerves: II: patient does not respond confrontation bilaterally, pupilsreactive bilaterally III,IV,VI: Oculocephalic response present bilaterally.  V,VII: corneal reflex present bilaterally  VIII: patient does not respond to verbal stimuli IX,X: gag reflex reduced, XI: trapezius strength unable to test bilaterally XII: tongue strength unable to test Motor: Extremities drop back to bed when lifted.  No resistance noted.    Lab Results: Basic Metabolic Panel: Recent Labs  Lab 08/16/19 2029 08/16/19 2029 08/17/19 0243 08/17/19 0243 08/17/19 0656 08/19/19 0406 08/20/19 1415 08/21/19 0447 08/21/19 1400 08/22/19 0423  NA 134*  --  134*  --   --  142  --   --  145 139  K 3.8  --  3.9  --   --  4.3  --   --  3.5 4.5  CL 92*  --  93*  --   --  104  --   --  100 97*  CO2 32  --  29  --   --  17*  --   --  27 32  GLUCOSE 224*  --  260*  --   --  131*  --   --  185* 267*  BUN 14  --  24*  --   --  96*  --   --  93* 36*  CREATININE 1.88*  --   2.60*  --   --  6.78*  --   --  6.81* 3.23*  CALCIUM 8.6*   < > 8.1*   < >  --  7.7*  --   --  7.8* 7.9*  MG  --   --   --   --  2.3 2.7* 2.3 2.6*  --  2.0  PHOS  --   --   --    < > 3.9 7.0* 7.0* 7.2* 7.3* 4.3   < > = values in this interval not displayed.    Liver Function Tests: Recent Labs  Lab 08/16/19 2029 08/19/19 0406 08/21/19 1400  AST 48*  --   --   ALT 31  --   --   ALKPHOS 155*  --   --   BILITOT 0.4  --   --   PROT 7.8  --   --   ALBUMIN 4.2 3.4* 3.0*   No results for input(s): LIPASE, AMYLASE in the  last 168 hours. Recent Labs  Lab 08/20/19 0444 08/21/19 0447 08/22/19 0423  AMMONIA 25 23 32    CBC: Recent Labs  Lab 08/16/19 2029 08/17/19 0243 08/19/19 0406 08/21/19 1400  WBC 6.4 6.6 11.3* 8.2  NEUTROABS 5.1  --   --   --   HGB 12.1 10.2* 9.6* 10.7*  HCT 38.0 31.8* 29.4* 32.7*  MCV 82.4 82.2 82.8 82.2  PLT 215 176 198 219    Cardiac Enzymes: No results for input(s): CKTOTAL, CKMB, CKMBINDEX, TROPONINI in the last 168 hours.  Lipid Panel: No results for input(s): CHOL, TRIG, HDL, CHOLHDL, VLDL, LDLCALC in the last 168 hours.  CBG: Recent Labs  Lab 08/21/19 1143 08/21/19 1942 08/22/19 0026 08/22/19 0429 08/22/19 0721  GLUCAP 158* 167* 186* 246* 159*    Microbiology: Results for orders placed or performed during the hospital encounter of 08/16/19  Urine culture     Status: None   Collection Time: 08/16/19  8:23 PM   Specimen: In/Out Cath Urine  Result Value Ref Range Status   Specimen Description   Final    IN/OUT CATH URINE Performed at [facility], [address], [place], [place] [address]    Special Requests   Final    NONE Performed at [facility], [address] [place], [place] [address]    Culture   Final    NO GROWTH Performed at [facility], [place] [address], [place], [place] [address]    Report Status 08/18/2019 FINAL  Final  Blood Culture (routine x 2)     Status: None   Collection  Time: 08/16/19  8:29 PM   Specimen: BLOOD  Result Value Ref Range Status   Specimen Description BLOOD BLOOD LEFT FOREARM  Final   Special Requests   Final    BOTTLES DRAWN AEROBIC AND ANAEROBIC Blood Culture adequate volume   Culture   Final    NO GROWTH 5 DAYS Performed at [facility], [address] [place], [place] [address]    Report Status 08/21/2019 FINAL  Final  Blood Culture (routine x 2)     Status: None   Collection Time: 08/16/19  8:29 PM   Specimen: BLOOD  Result Value Ref Range Status   Specimen Description BLOOD BLOOD LEFT ARM  Final   Special Requests   Final    BOTTLES DRAWN AEROBIC AND ANAEROBIC Blood Culture results may not be optimal due to an inadequate volume of blood received in culture bottles   Culture   Final    NO GROWTH 5 DAYS Performed at [facility], [address] [place],  [address]    Report Status 08/21/2019 FINAL  Final  Respiratory Panel by RT PCR (Flu A&B, Covid) - Urine, Clean Catch     Status: None   Collection Time: 08/16/19  8:47 PM   Specimen: Urine, Clean Catch  Result Value Ref Range Status   SARS Coronavirus 2 by RT PCR NEGATIVE NEGATIVE Final    Comment: (NOTE) SARS-CoV-2 target nucleic acids are NOT DETECTED. The SARS-CoV-2 RNA is generally detectable in upper respiratoy specimens during the acute phase of infection. The lowest concentration of SARS-CoV-2 viral copies this assay can detect is 131 copies/mL. A negative result does not preclude SARS-Cov-2 infection and should not be used as the sole basis for treatment or other patient management decisions. A negative result may occur with  improper specimen collection/handling, submission of specimen other than nasopharyngeal swab, presence of viral  mutation(s) within the areas targeted by this assay, and inadequate number of viral copies (<131 copies/mL). A negative result must be combined with clinical observations, patient history, and  epidemiological information. The expected result is Negative. Fact Sheet for Patients:  PinkCheek.be Fact Sheet for Healthcare Providers:  GravelBags.it This test is not yet ap proved or cleared by the [place] FDA and  has been authorized for detection and/or diagnosis of SARS-CoV-2 by FDA under an Emergency Use Authorization (EUA). This EUA will remain  in effect (meaning this test can be used) for the duration of the COVID-19 declaration under Section 564(b)(1) of the Act, 21 U.S.C. section 360bbb-3(b)(1), unless the authorization is terminated or revoked sooner.    Influenza A by PCR NEGATIVE NEGATIVE Final   Influenza B by PCR NEGATIVE NEGATIVE Final    Comment: (NOTE) The Xpert Xpress SARS-CoV-2/FLU/RSV assay is intended as an aid in  the diagnosis of influenza from Nasopharyngeal swab specimens and  should not be used as a sole basis for treatment. Nasal washings and  aspirates are unacceptable for Xpert Xpress SARS-CoV-2/FLU/RSV  testing. Fact Sheet for Patients: PinkCheek.be Fact Sheet for Healthcare Providers: GravelBags.it This test is not yet approved or cleared by the [place] FDA and  has been authorized for detection and/or diagnosis of SARS-CoV-2 by  FDA under an Emergency Use Authorization (EUA). This EUA will remain  in effect (meaning this test can be used) for the duration of the  Covid-19 declaration under Section 564(b)(1) of the Act, 21  U.S.C. section 360bbb-3(b)(1), unless the authorization is  terminated or revoked. Performed at [facility], [address] [place], [place] [address]     Coagulation Studies: No results for input(s): LABPROT, INR in the last 72 hours.  Imaging: EEG  Result Date: 08/20/2019 Alexis Goodell, MD     08/20/2019  4:59 PM ELECTROENCEPHALOGRAM REPORT Patient: Amber Dixon        Room #: IC20A-AA EEG No. ID: [id number] Age: 69 y.o.        Sex: female Requesting Physician: Mal Misty Report Date:  08/20/2019       Interpreting Physician: Alexis Goodell History: Amber Dixon is an 69 y.o. female with altered mental status Medications: Norvasc, Rocephin, Catapres, Lasix, Insulin Conditions of Recording:  This is a 21 channel routine scalp EEG performed with bipolar and monopolar montages arranged in accordance to the international 10/20 system of electrode placement. One channel was dedicated to EKG recording. The patient is in the poorly responsive state. Description:  The background activity is slow and poorly organized diffusely with frequent, high voltage, generalized, intermittent periodic discharges of triphasic morphology.  These are continuous throughout the recording and occur at a frequency of approximately 2-3 Hz.  There is no change in the background rhythm with stimulation.  Hyperventilation and intermittent photic stimulation were not performed. IMPRESSION: This is a markedly abnormal electroencephalogram due to general background slowing and frequent, generalized triphasic waves.  This finding is consistent with a severe encephalopathic state, etiology nonspecific. Alexis Goodell, MD Neurology [phone] 08/20/2019, 4:02 PM    Medications:  I have reviewed the patient's current medications. Scheduled: . amLODipine  10 mg Oral Daily  . B-complex with vitamin C  1 tablet Per Tube QHS  . Chlorhexidine Gluconate Cloth  6 each Topical Daily  . cloNIDine  0.2 mg Transdermal Weekly  . feeding supplement (JEVITY 1.5 CAL/FIBER)  1,000 mL Oral Q24H  . furosemide  80 mg Oral QODAY  .  heparin  5,000 Units Subcutaneous Q12H  . insulin aspart  0-6 Units Subcutaneous Q4H    Assessment/Plan: 69 y.o.femalewith a known history of type 2 diabetes mellitus, hypertension and chronic kidney disease, who presented to the emergency room with acute onset of altered  mental status with confusion and decreased responsiveness with associated elevated blood pressurealong with fever. Etiology of fever unclear. Depressed mental status felt to be secondary to metabolic encephalopathy.  LastBUN/Cr well above baseline. Phosphorus elevated.  Neurological examination less focal today and somewhat improved. MRI of the brain personally reviewed and shows no acute changes. EEG consistent with a severe encephalopathy.   Patient not as responsive today.  Findings remain consistent with toxic/metabolic encephalopathy but due to underlying medical issues may take an extended period of time for sustained, improved mental status.    Recommendations: 1. Will continue to follow with you.    Spanish interpreter present for this evaluation   LOS: 6 days   Alexis Goodell, MD Neurology [phone] 08/22/2019  9:43 AM

## 2019-08-22 NOTE — Progress Notes (Signed)
Patient ID: Amber Dixon, female   DOB: 1951-04-23, 69 y.o.   MRN: [id number]  This NP visited patient at the bedside as a follow up for Palliative Medicine needs and emotional support.  I met with large family at the bedside as scheduled, with the assistance of interpretor, for continued education regarding the patient's current medcial situation.  Although she is a bit more alert today, eyes are open, she is unable to follow commands and is non verbal in response to her family or interpretor.  Attempted to illicit values and GOCs important to patient and this family     Ongoing education offered regarding current medical situation;  encephalopathy; possible underlying reasons, diagnostics associated with work-up, ESRD and its treatment and natural trajectory.   Education regarding concepts of human mortality, adult failure to thrive and the limitatons of medical interventions to prolong quality of life when a body does fail to thrive.  Discussed with familythe importance of continued conversation with each other and the  medical providers regarding overall plan of care and treatment options,  ensuring decisions are within the context of the patients values and GOCs.  Specifically education offered regarding code status and artificial feeding.    Encouraged family to consider DNR/DNI status understanding evidenced based poor outcomes in similar hospitalized patient, as the cause of arrest is likely associated with advanced chronic illness rather than an easily reversible acute cardio-pulmonary event.  Questions and concerns addressed     Plan of Care: -Full Code -Family is open to all offered and available medical interventions to prolong life, they are hopeful for improvement - Lenna Sciara is first contact, she is the only family member that speaks English  Discussed with Dr Doy Mince   PMT will continue to support holistically, family will need ongoing support in navigating  healthcare and treatment option decisions  Total time spent on the unit was 60 minutes  Greater than 50% of the time was spent in counseling and coordination of care  Wadie Lessen NP  Palliative Medicine Team Team Phone # [phone] Pager [phone]

## 2019-08-22 NOTE — Progress Notes (Signed)
[facility]  ROUNDING NOTE   Subjective:   Son at bedside. History taken with assistance of Spanish Interpreter.   Hemodialysis treatment yesterday. Tolerated treatment well.   Patient opening eyes and nodding yes and no to questions.   Objective:  Vital signs in last 24 hours:  Temp:  [97.6 F (36.4 C)-99.3 F (37.4 C)] 99.3 F (37.4 C) (04/30 1400) Pulse Rate:  [77-100] 84 (04/30 1400) Resp:  [8-28] 20 (04/30 1400) BP: (78-184)/(43-76) 124/61 (04/30 1400) SpO2:  [97 %-100 %] 100 % (04/30 1400) Weight:  [29.6 kg] 29.6 kg (04/30 0453)  Weight change: -1 kg Filed Weights   08/17/19 0624 08/21/19 0500 08/22/19 0453  Weight: 33.6 kg 30.6 kg 29.6 kg    Intake/Output: I/O last 3 completed shifts: In: 600 [NG/GT:500; IV Piggyback:100] Out: 500 [Other:500]   Intake/Output this shift:  No intake/output data recorded.  Physical Exam: General: NAD, laying in bed  Head: Normocephalic, atraumatic. Moist oral mucosal membranes  Eyes: Anicteric, PERRL  Neck: Supple, trachea midline  Lungs:  Clear to auscultation  Heart: Regular rate and rhythm  Abdomen:  Soft, nontender  Extremities:  no peripheral edema.  Neurologic: lethargic  Skin: No lesions  Access: Right AVF    Basic Metabolic Panel: Recent Labs  Lab 08/16/19 2029 08/16/19 2029 08/17/19 0243 08/17/19 0243 08/17/19 0656 08/19/19 0406 08/20/19 1415 08/21/19 0447 08/21/19 1400 08/22/19 0423  NA 134*  --  134*  --   --  142  --   --  145 139  K 3.8  --  3.9  --   --  4.3  --   --  3.5 4.5  CL 92*  --  93*  --   --  104  --   --  100 97*  CO2 32  --  29  --   --  17*  --   --  27 32  GLUCOSE 224*  --  260*  --   --  131*  --   --  185* 267*  BUN 14  --  24*  --   --  96*  --   --  93* 36*  CREATININE 1.88*  --  2.60*  --   --  6.78*  --   --  6.81* 3.23*  CALCIUM 8.6*   < > 8.1*   < >  --  7.7*  --   --  7.8* 7.9*  MG  --   --   --   --  2.3 2.7* 2.3 2.6*  --  2.0  PHOS  --   --   --    < >  3.9 7.0* 7.0* 7.2* 7.3* 4.3   < > = values in this interval not displayed.    Liver Function Tests: Recent Labs  Lab 08/16/19 2029 08/19/19 0406 08/21/19 1400  AST 48*  --   --   ALT 31  --   --   ALKPHOS 155*  --   --   BILITOT 0.4  --   --   PROT 7.8  --   --   ALBUMIN 4.2 3.4* 3.0*   No results for input(s): LIPASE, AMYLASE in the last 168 hours. Recent Labs  Lab 08/20/19 0444 08/21/19 0447 08/22/19 0423  AMMONIA 25 23 32    CBC: Recent Labs  Lab 08/16/19 2029 08/17/19 0243 08/19/19 0406 08/21/19 1400  WBC 6.4 6.6 11.3* 8.2  NEUTROABS 5.1  --   --   --  HGB 12.1 10.2* 9.6* 10.7*  HCT 38.0 31.8* 29.4* 32.7*  MCV 82.4 82.2 82.8 82.2  PLT 215 176 198 219    Cardiac Enzymes: No results for input(s): CKTOTAL, CKMB, CKMBINDEX, TROPONINI in the last 168 hours.  BNP: Invalid input(s): POCBNP  CBG: Recent Labs  Lab 08/22/19 0026 08/22/19 0429 08/22/19 0721 08/22/19 1132 08/22/19 1629  GLUCAP 186* 246* 159* 153* 60*    Microbiology: Results for orders placed or performed during the hospital encounter of 08/16/19  Urine culture     Status: None   Collection Time: 08/16/19  8:23 PM   Specimen: In/Out Cath Urine  Result Value Ref Range Status   Specimen Description   Final    IN/OUT CATH URINE Performed at [facility], [address] [place], [place] [address]    Special Requests   Final    NONE Performed at [facility], [address], [place], [place] [address]    Culture   Final    NO GROWTH Performed at [facility], [place] [address] [place], [place] [address]    Report Status 08/18/2019 FINAL  Final  Blood Culture (routine x 2)     Status: None   Collection Time: 08/16/19  8:29 PM   Specimen: BLOOD  Result Value Ref Range Status   Specimen Description BLOOD BLOOD LEFT FOREARM  Final   Special Requests   Final    BOTTLES DRAWN AEROBIC AND ANAEROBIC Blood Culture adequate volume   Culture   Final    NO GROWTH  5 DAYS Performed at [facility], [address] [place], [place] [address]    Report Status 08/21/2019 FINAL  Final  Blood Culture (routine x 2)     Status: None   Collection Time: 08/16/19  8:29 PM   Specimen: BLOOD  Result Value Ref Range Status   Specimen Description BLOOD BLOOD LEFT ARM  Final   Special Requests   Final    BOTTLES DRAWN AEROBIC AND ANAEROBIC Blood Culture results may not be optimal due to an inadequate volume of blood received in culture bottles   Culture   Final    NO GROWTH 5 DAYS Performed at [facility], [address] [place], [place] [address]    Report Status 08/21/2019 FINAL  Final  Respiratory Panel by RT PCR (Flu A&B, Covid) - Urine, Clean Catch     Status: None   Collection Time: 08/16/19  8:47 PM   Specimen: Urine, Clean Catch  Result Value Ref Range Status   SARS Coronavirus 2 by RT PCR NEGATIVE NEGATIVE Final    Comment: (NOTE) SARS-CoV-2 target nucleic acids are NOT DETECTED. The SARS-CoV-2 RNA is generally detectable in upper respiratoy specimens during the acute phase of infection. The lowest concentration of SARS-CoV-2 viral copies this assay can detect is 131 copies/mL. A negative result does not preclude SARS-Cov-2 infection and should not be used as the sole basis for treatment or other patient management decisions. A negative result may occur with  improper specimen collection/handling, submission of specimen other than nasopharyngeal swab, presence of viral mutation(s) within the areas targeted by this assay, and inadequate number of viral copies (<131 copies/mL). A negative result must be combined with clinical observations, patient history, and epidemiological information. The expected result is Negative. Fact Sheet for Patients:  PinkCheek.be Fact Sheet for Healthcare Providers:  GravelBags.it This test is not yet ap proved or cleared by the [place] FDA and  has  been authorized for detection and/or diagnosis of SARS-CoV-2 by FDA under an Emergency Use Authorization (EUA). This EUA will remain  in effect (meaning this test can be used) for the duration of the COVID-19 declaration under Section 564(b)(1) of the Act, 21 U.S.C. section 360bbb-3(b)(1), unless the authorization is terminated or revoked sooner.    Influenza A by PCR NEGATIVE NEGATIVE Final   Influenza B by PCR NEGATIVE NEGATIVE Final    Comment: (NOTE) The Xpert Xpress SARS-CoV-2/FLU/RSV assay is intended as an aid in  the diagnosis of influenza from Nasopharyngeal swab specimens and  should not be used as a sole basis for treatment. Nasal washings and  aspirates are unacceptable for Xpert Xpress SARS-CoV-2/FLU/RSV  testing. Fact Sheet for Patients: PinkCheek.be Fact Sheet for Healthcare Providers: GravelBags.it This test is not yet approved or cleared by the [place] FDA and  has been authorized for detection and/or diagnosis of SARS-CoV-2 by  FDA under an Emergency Use Authorization (EUA). This EUA will remain  in effect (meaning this test can be used) for the duration of the  Covid-19 declaration under Section 564(b)(1) of the Act, 21  U.S.C. section 360bbb-3(b)(1), unless the authorization is  terminated or revoked. Performed at [facility], [address] [place], [place] [address]     Coagulation Studies: No results for input(s): LABPROT, INR in the last 72 hours.  Urinalysis: No results for input(s): COLORURINE, LABSPEC, PHURINE, GLUCOSEU, HGBUR, BILIRUBINUR, KETONESUR, PROTEINUR, UROBILINOGEN, NITRITE, LEUKOCYTESUR in the last 72 hours.  Invalid input(s): APPERANCEUR    Imaging: No results found.   Medications:    . B-complex with vitamin C  1 tablet Per Tube QHS  . chlorhexidine  15 mL Mouth Rinse BID  . Chlorhexidine Gluconate Cloth  6 each Topical Daily  .  cloNIDine  0.2 mg Transdermal Weekly  . feeding supplement (JEVITY 1.5 CAL/FIBER)  1,000 mL Oral Q24H  . furosemide  80 mg Oral QODAY  . heparin  5,000 Units Subcutaneous Q12H  . insulin aspart  0-6 Units Subcutaneous Q4H  . insulin glargine  10 Units Subcutaneous QHS  . mouth rinse  15 mL Mouth Rinse q12n4p   acetaminophen **OR** acetaminophen, hydrALAZINE, ipratropium-albuterol, labetalol, magnesium hydroxide, ondansetron **OR** ondansetron (ZOFRAN) IV  Assessment/ Plan:  [name] is a 69 y.o. Hispanic female with end stage renal disease on hemodialysis, diabetes mellitus, hypertension, who was admitted 08/16/2019 for Encephalopathy [G93.40] Sepsis ([place]) [A41.9] Fever, unspecified fever cause [R50.9] Altered mental status, unspecified altered mental status type [R41.82]  [facility] ([place] TTS Right AVF  1. End stage renal disease: TTS schedule.  Patient has moved to [facility] and will be transitioning to [place] and will be followed by [facility]  2. Hypertension: Home regimen of amlodipine and furosemide. PRN hydralazine  3. Anemia of chronic kidney disease:   - EPO with HD treatment.   4. Secondary Hyperparathyroidism: calcium and phosphorus at goal. Not currently on binders.   5. Encephalopathy:  Appreciate Neurology input  - Improving.    LOS: 6 Lyzette Reinhardt 4/30/20215:14 PM

## 2019-08-23 LAB — GLUCOSE, CAPILLARY
Glucose-Capillary: 107 mg/dL — ABNORMAL HIGH (ref 70–99)
Glucose-Capillary: 122 mg/dL — ABNORMAL HIGH (ref 70–99)
Glucose-Capillary: 160 mg/dL — ABNORMAL HIGH (ref 70–99)
Glucose-Capillary: 170 mg/dL — ABNORMAL HIGH (ref 70–99)
Glucose-Capillary: 213 mg/dL — ABNORMAL HIGH (ref 70–99)
Glucose-Capillary: 68 mg/dL — ABNORMAL LOW (ref 70–99)

## 2019-08-23 LAB — CBC
HCT: 32.1 % — ABNORMAL LOW (ref 36.0–46.0)
Hemoglobin: 10.2 g/dL — ABNORMAL LOW (ref 12.0–15.0)
MCH: 26.6 pg (ref 26.0–34.0)
MCHC: 31.8 g/dL (ref 30.0–36.0)
MCV: 83.8 fL (ref 80.0–100.0)
Platelets: 204 10*3/uL (ref 150–400)
RBC: 3.83 MIL/uL — ABNORMAL LOW (ref 3.87–5.11)
RDW: 16.9 % — ABNORMAL HIGH (ref 11.5–15.5)
WBC: 9.6 10*3/uL (ref 4.0–10.5)
nRBC: 0 % (ref 0.0–0.2)

## 2019-08-23 LAB — BASIC METABOLIC PANEL
Anion gap: 12 (ref 5–15)
BUN: 66 mg/dL — ABNORMAL HIGH (ref 8–23)
CO2: 32 mmol/L (ref 22–32)
Calcium: 7.7 mg/dL — ABNORMAL LOW (ref 8.9–10.3)
Chloride: 97 mmol/L — ABNORMAL LOW (ref 98–111)
Creatinine, Ser: 4.84 mg/dL — ABNORMAL HIGH (ref 0.44–1.00)
GFR calc Af Amer: 10 mL/min — ABNORMAL LOW (ref >60)
GFR calc non Af Amer: 8 mL/min — ABNORMAL LOW (ref >60)
Glucose, Bld: 177 mg/dL — ABNORMAL HIGH (ref 70–99)
Potassium: 4.5 mmol/L (ref 3.5–5.1)
Sodium: 141 mmol/L (ref 135–145)

## 2019-08-23 LAB — PHOSPHORUS: Phosphorus: 5.6 mg/dL — ABNORMAL HIGH (ref 2.5–4.6)

## 2019-08-23 LAB — AMMONIA: Ammonia: 20 umol/L (ref 9–35)

## 2019-08-23 LAB — MAGNESIUM: Magnesium: 2.4 mg/dL (ref 1.7–2.4)

## 2019-08-23 MED ORDER — AMLODIPINE BESYLATE 5 MG PO TABS
5.0000 mg | ORAL_TABLET | Freq: Every day | ORAL | Status: DC
  Administered 2019-08-23 – 2019-09-05 (×14): 5 mg via ORAL
  Filled 2019-08-23 (×14): qty 1

## 2019-08-23 NOTE — Progress Notes (Signed)
This note also relates to the following rows which could not be included: Pulse Rate - Cannot attach notes to unvalidated device data Resp - Cannot attach notes to unvalidated device data  Notified dt singh of patients low bp during treatment.  V. O. Is to continue treatment and give ns bolus of 200 at a time as needed to maintain bp.

## 2019-08-23 NOTE — Progress Notes (Signed)
[facility]  ROUNDING NOTE   Subjective:   Patient seen during dialysis Treatment had to be positive for hypotension which responded to IV fluid ministration Patient is minimally responsive today   HEMODIALYSIS FLOWSHEET:  Blood Flow Rate (mL/min): 300 mL/min Arterial Pressure (mmHg): -120 mmHg Venous Pressure (mmHg): 90 mmHg Transmembrane Pressure (mmHg): 40 mmHg Ultrafiltration Rate (mL/min): 20 mL/min Dialysate Flow Rate (mL/min): 600 ml/min Conductivity: Machine : 14 Conductivity: Machine : 14 Dialysis Fluid Bolus: Normal Saline Bolus Amount (mL): 100 mL     Objective:  Vital signs in last 24 hours:  Temp:  [97.7 F (36.5 C)-99.1 F (37.3 C)] 97.7 F (36.5 C) (05/01 1220) Pulse Rate:  [73-92] 82 (05/01 1240) Resp:  [10-26] 12 (05/01 1230) BP: (84-160)/(45-64) 160/64 (05/01 1345) SpO2:  [97 %-100 %] 99 % (05/01 0920) Weight:  [28.7 kg] 28.7 kg (05/01 0429)  Weight change: -0.9 kg Filed Weights   08/21/19 0500 08/22/19 0453 08/23/19 0429  Weight: 30.6 kg 29.6 kg 28.7 kg    Intake/Output: I/O last 3 completed shifts: In: 780 [NG/GT:780] Out: 0    Intake/Output this shift:  Total I/O In: -  Out: -600   Physical Exam: General: NAD, laying in bed  Head: Normocephalic, atraumatic. Moist oral mucosal membranes  Eyes: Anicteric,   Lungs:  Clear to auscultation  Heart: Regular rate and rhythm  Abdomen:  Soft, nontender  Extremities:  no peripheral edema.  Neurologic: lethargic  Skin: No lesions  Access: Right AVF    Basic Metabolic Panel: Recent Labs  Lab 08/17/19 0243 08/17/19 0656 08/19/19 0406 08/19/19 0406 08/20/19 1415 08/21/19 0447 08/21/19 1400 08/22/19 0423 08/23/19 0417  NA 134*  --  142  --   --   --  145 139 141  K 3.9  --  4.3  --   --   --  3.5 4.5 4.5  CL 93*  --  104  --   --   --  100 97* 97*  CO2 29  --  17*  --   --   --  27 32 32  GLUCOSE 260*  --  131*  --   --   --  185* 267* 177*  BUN 24*  --  96*  --    --   --  93* 36* 66*  CREATININE 2.60*  --  6.78*  --   --   --  6.81* 3.23* 4.84*  CALCIUM 8.1*  --  7.7*   < >  --   --  7.8* 7.9* 7.7*  MG  --    < > 2.7*  --  2.3 2.6*  --  2.0 2.4  PHOS  --    < > 7.0*   < > 7.0* 7.2* 7.3* 4.3 5.6*   < > = values in this interval not displayed.    Liver Function Tests: Recent Labs  Lab 08/16/19 2029 08/19/19 0406 08/21/19 1400  AST 48*  --   --   ALT 31  --   --   ALKPHOS 155*  --   --   BILITOT 0.4  --   --   PROT 7.8  --   --   ALBUMIN 4.2 3.4* 3.0*   No results for input(s): LIPASE, AMYLASE in the last 168 hours. Recent Labs  Lab 08/21/19 0447 08/22/19 0423 08/23/19 0417  AMMONIA 23 32 20    CBC: Recent Labs  Lab 08/16/19 2029 08/17/19 0243 08/19/19 0406 08/21/19 1400 08/23/19  0417  WBC 6.4 6.6 11.3* 8.2 9.6  NEUTROABS 5.1  --   --   --   --   HGB 12.1 10.2* 9.6* 10.7* 10.2*  HCT 38.0 31.8* 29.4* 32.7* 32.1*  MCV 82.4 82.2 82.8 82.2 83.8  PLT 215 176 198 219 204    Cardiac Enzymes: No results for input(s): CKTOTAL, CKMB, CKMBINDEX, TROPONINI in the last 168 hours.  BNP: Invalid input(s): POCBNP  CBG: Recent Labs  Lab 08/22/19 2025 08/22/19 2322 08/23/19 0408 08/23/19 0746 08/23/19 1351  GLUCAP 227* 161* 160* 122* 58*    Microbiology: Results for orders placed or performed during the hospital encounter of 08/16/19  Urine culture     Status: None   Collection Time: 08/16/19  8:23 PM   Specimen: In/Out Cath Urine  Result Value Ref Range Status   Specimen Description   Final    IN/OUT CATH URINE Performed at [facility], [address] [place], [place] [address]    Special Requests   Final    NONE Performed at [facility], [address] [place], [place] [address]    Culture   Final    NO GROWTH Performed at [facility], [place] [address], [place], [place] [address]    Report Status 08/18/2019 FINAL  Final  Blood Culture (routine x 2)     Status: None   Collection Time:  08/16/19  8:29 PM   Specimen: BLOOD  Result Value Ref Range Status   Specimen Description BLOOD BLOOD LEFT FOREARM  Final   Special Requests   Final    BOTTLES DRAWN AEROBIC AND ANAEROBIC Blood Culture adequate volume   Culture   Final    NO GROWTH 5 DAYS Performed at [facility] [address] [place], [place] [address]    Report Status 08/21/2019 FINAL  Final  Blood Culture (routine x 2)     Status: None   Collection Time: 08/16/19  8:29 PM   Specimen: BLOOD  Result Value Ref Range Status   Specimen Description BLOOD BLOOD LEFT ARM  Final   Special Requests   Final    BOTTLES DRAWN AEROBIC AND ANAEROBIC Blood Culture results may not be optimal due to an inadequate volume of blood received in culture bottles   Culture   Final    NO GROWTH 5 DAYS Performed at [facility], [address] [place], [place] [address]    Report Status 08/21/2019 FINAL  Final  Respiratory Panel by RT PCR (Flu A&B, Covid) - Urine, Clean Catch     Status: None   Collection Time: 08/16/19  8:47 PM   Specimen: Urine, Clean Catch  Result Value Ref Range Status   SARS Coronavirus 2 by RT PCR NEGATIVE NEGATIVE Final    Comment: (NOTE) SARS-CoV-2 target nucleic acids are NOT DETECTED. The SARS-CoV-2 RNA is generally detectable in upper respiratoy specimens during the acute phase of infection. The lowest concentration of SARS-CoV-2 viral copies this assay can detect is 131 copies/mL. A negative result does not preclude SARS-Cov-2 infection and should not be used as the sole basis for treatment or other patient management decisions. A negative result may occur with  improper specimen collection/handling, submission of specimen other than nasopharyngeal swab, presence of viral mutation(s) within the areas targeted by this assay, and inadequate number of viral copies (<131 copies/mL). A negative result must be combined with clinical observations, patient history, and epidemiological  information. The expected result is Negative. Fact Sheet  for Patients:  PinkCheek.be Fact Sheet for Healthcare Providers:  GravelBags.it This test is not yet ap proved or cleared by the [place] FDA and  has been authorized for detection and/or diagnosis of SARS-CoV-2 by FDA under an Emergency Use Authorization (EUA). This EUA will remain  in effect (meaning this test can be used) for the duration of the COVID-19 declaration under Section 564(b)(1) of the Act, 21 U.S.C. section 360bbb-3(b)(1), unless the authorization is terminated or revoked sooner.    Influenza A by PCR NEGATIVE NEGATIVE Final   Influenza B by PCR NEGATIVE NEGATIVE Final    Comment: (NOTE) The Xpert Xpress SARS-CoV-2/FLU/RSV assay is intended as an aid in  the diagnosis of influenza from Nasopharyngeal swab specimens and  should not be used as a sole basis for treatment. Nasal washings and  aspirates are unacceptable for Xpert Xpress SARS-CoV-2/FLU/RSV  testing. Fact Sheet for Patients: PinkCheek.be Fact Sheet for Healthcare Providers: GravelBags.it This test is not yet approved or cleared by the [place] FDA and  has been authorized for detection and/or diagnosis of SARS-CoV-2 by  FDA under an Emergency Use Authorization (EUA). This EUA will remain  in effect (meaning this test can be used) for the duration of the  Covid-19 declaration under Section 564(b)(1) of the Act, 21  U.S.C. section 360bbb-3(b)(1), unless the authorization is  terminated or revoked. Performed at [facility], [address] [place], [place] [address]     Coagulation Studies: No results for input(s): LABPROT, INR in the last 72 hours.  Urinalysis: No results for input(s): COLORURINE, LABSPEC, PHURINE, GLUCOSEU, HGBUR, BILIRUBINUR, KETONESUR, PROTEINUR, UROBILINOGEN, NITRITE, LEUKOCYTESUR in the last  72 hours.  Invalid input(s): APPERANCEUR    Imaging: No results found.   Medications:    . B-complex with vitamin C  1 tablet Per Tube QHS  . chlorhexidine  15 mL Mouth Rinse BID  . Chlorhexidine Gluconate Cloth  6 each Topical Daily  . cloNIDine  0.2 mg Transdermal Weekly  . feeding supplement (JEVITY 1.5 CAL/FIBER)  1,000 mL Oral Q24H  . furosemide  80 mg Oral QODAY  . heparin  5,000 Units Subcutaneous Q12H  . insulin aspart  0-6 Units Subcutaneous Q4H  . insulin glargine  10 Units Subcutaneous QHS  . mouth rinse  15 mL Mouth Rinse q12n4p   acetaminophen **OR** acetaminophen, hydrALAZINE, ipratropium-albuterol, labetalol, magnesium hydroxide, ondansetron **OR** ondansetron (ZOFRAN) IV  Assessment/ Plan:  [name] is a 69 y.o. Hispanic female with end stage renal disease on hemodialysis, diabetes mellitus, hypertension, who was admitted 08/16/2019 for Encephalopathy [G93.40] Sepsis ([place]) [A41.9] Fever, unspecified fever cause [R50.9] Altered mental status, unspecified altered mental status type [R41.82]  [facility] [place] T[facility] Right AVF  1. End stage renal disease: TTS schedule.  Patient has moved to [facility] and will be transitioning to [place] and will be followed by [facility]  2. Hypertension: Home regimen of amlodipine and furosemide. PRN hydralazine BP Readings from Last 3 Encounters:  08/23/19 (!) 160/64  08/14/19 (!) 161/100  07/30/19 (!) 185/79    3. Anemia of chronic kidney disease:   - EPO with HD treatment.  Lab Results  Component Value Date   HGB 10.2 (L) 08/23/2019     4. Secondary Hyperparathyroidism: calcium and phosphorus at goal. Not currently on binders.  Lab Results  Component Value Date   PTH 269 (H) 03/22/2019   PTH Comment 03/22/2019   CALCIUM 7.7 (L) 08/23/2019  CAION 0.90 (L) 06/09/2019   PHOS 5.6 (H) 08/23/2019     5. Encephalopathy:  Evaluated by  neurologist 4/30 Depressed mental status is felt to be secondary to metabolic encephalopathy MRI brain no acute changes EEG consistent with severe metabolic encephalopathy    LOS: 7 Amber Dixon 5/1/20212:27 PM

## 2019-08-23 NOTE — Progress Notes (Signed)
SLP Cancellation Note  Patient Details Name: Amber Dixon MRN: [id number] DOB: 08/13/1950   Cancelled treatment:       Reason Eval/Treat Not Completed: Patient not medically ready;Medical issues which prohibited therapy(chart reviewed). Pt continues w/ NG TFs for nutritional needs at this time. ST services will f/u when NG is able to be removed and pt is ready for po intake/assessment. Recommend frequent oral care for hygiene and stimulation of swallowing. Aspiration precautions.     Orinda Kenner, MS, CCC-SLP [name] 08/23/2019, 10:18 AM

## 2019-08-23 NOTE — Progress Notes (Signed)
Hd started  

## 2019-08-23 NOTE — Progress Notes (Signed)
This note also relates to the following rows which could not be included: Pulse Rate - Cannot attach notes to unvalidated device data Resp - Cannot attach notes to unvalidated device data  Hd completed  

## 2019-08-23 NOTE — Progress Notes (Addendum)
Progress Note    Amber Dixon  JTT:[id number] DOB: Sep 20, 1950  DOA: 08/16/2019 PCP: Patient, No Pcp Per      Brief Narrative:    Medical records reviewed and are as summarized below:        Assessment/Plan:   Active Problems:   ESRD (end stage renal disease) on dialysis ([place])   Encephalopathy   Altered mental status   DNR (do not resuscitate) discussion   Palliative care by specialist   Protein-calorie malnutrition, severe   SIRS: No clear source of infection so far.  Antibiotics have been discontinued  Acute toxic metabolic encephalopathy: Slowly improving.  Work-up including CT head, MRI brain, TFT, ammonia and cultures unrevealing.  No lumbar puncture for now per discussion between family and neurologist.  Hypertension/ s/p hypertensive emergency: Blood pressure is creeping up again.  Resume amlodipine.  Continue clonidine patch.  Type 2 diabetes mellitus with hyperglycemia: This is likely from enteral nutrition.  Continue low-dose Lantus.  Continue NovoLog as needed.  ESRD/bone marrow disorder: On HD TTS. Follow-up with nephrologist  Anemia of chronic disease: H&H stable. Monitor H&H  Underweight/severe protein calorie malnutrition:Body mass index is 14.96 kg/m.   NG tube was placed on 08/20/2019.  Continue enteral nutrition.  Body mass index is 12.78 kg/m.  (Underweight)        Family Communication/Anticipated D/C date and plan/Code Status   DVT prophylaxis: Heparin Code Status: Full code Family Communication: Plan discussed with her son at the bedside and Melissa on son's speaker phone.  Disposition Plan:    Status is: Inpatient  Remains inpatient appropriate because:Inpatient level of care appropriate due to severity of illness   Dispo: The patient is from: Home              Anticipated d/c is to: SNF              Anticipated d/c date is: 3 days              Patient currently is not medically stable to  d/c.            Subjective:   Patient is more awake today but she is nonverbal and still unable to provide any history.  Objective:    Vitals:   08/20/19 1600 08/20/19 1900 08/20/19 2000 08/20/19 2100  BP: 131/62 (!) 145/65 (!) 160/62 (!) 151/65  Pulse: 97 99 98 93  Resp: 17 20 18 18   Temp:  98.4 F (36.9 C)    TempSrc:  Oral    SpO2: 97% 98% 97% 100%  Weight:      Height:       No data found.   Intake/Output Summary (Last 24 hours) at 08/23/2019 1516 Last data filed at 08/23/2019 1220 Gross per 24 hour  Intake 480 ml  Output -600 ml  Net 1080 ml   Filed Weights   08/21/19 0500 08/22/19 0453 08/23/19 0429  Weight: 30.6 kg 29.6 kg 28.7 kg    Exam:  GEN: NAD SKIN: Warm and dry EYES: No pallor or icterus ENT: MMM, nasogastric tube in place for enteral nutrition CV: RRR PULM: CTA B ABD: soft, ND, NT, +BS CNS: Awake and alert.  Eyes are open.  She is nonverbal.  She shook her head when her son asked a question but that was all she could do. EXT: No edema or tenderness.  ?Chronic right knee deformity.   Data Reviewed:   I have personally reviewed following labs and  imaging studies:  Labs: Labs show the following:   Basic Metabolic Panel: Recent Labs  Lab 08/17/19 0243 08/17/19 0656 08/19/19 0406 08/19/19 0406 08/20/19 1415 08/21/19 0447 08/21/19 1400 08/21/19 1400 08/22/19 0423 08/23/19 0417  NA 134*  --  142  --   --   --  145  --  139 141  K 3.9  --  4.3   < >  --   --  3.5   < > 4.5 4.5  CL 93*  --  104  --   --   --  100  --  97* 97*  CO2 29  --  17*  --   --   --  27  --  32 32  GLUCOSE 260*  --  131*  --   --   --  185*  --  267* 177*  BUN 24*  --  96*  --   --   --  93*  --  36* 66*  CREATININE 2.60*  --  6.78*  --   --   --  6.81*  --  3.23* 4.84*  CALCIUM 8.1*  --  7.7*  --   --   --  7.8*  --  7.9* 7.7*  MG  --    < > 2.7*  --  2.3 2.6*  --   --  2.0 2.4  PHOS  --    < > 7.0*   < > 7.0* 7.2* 7.3*  --  4.3 5.6*   < > = values in  this interval not displayed.   GFR Estimated Creatinine Clearance: 4.9 mL/min (A) (by C-G formula based on SCr of 4.84 mg/dL (H)). Liver Function Tests: Recent Labs  Lab 08/16/19 2029 08/19/19 0406 08/21/19 1400  AST 48*  --   --   ALT 31  --   --   ALKPHOS 155*  --   --   BILITOT 0.4  --   --   PROT 7.8  --   --   ALBUMIN 4.2 3.4* 3.0*   No results for input(s): LIPASE, AMYLASE in the last 168 hours. Recent Labs  Lab 08/19/19 0406 08/20/19 0444 08/21/19 0447 08/22/19 0423 08/23/19 0417  AMMONIA 36* 25 23 32 20   Coagulation profile Recent Labs  Lab 08/16/19 2029  INR 0.9    CBC: Recent Labs  Lab 08/16/19 2029 08/17/19 0243 08/19/19 0406 08/21/19 1400 08/23/19 0417  WBC 6.4 6.6 11.3* 8.2 9.6  NEUTROABS 5.1  --   --   --   --   HGB 12.1 10.2* 9.6* 10.7* 10.2*  HCT 38.0 31.8* 29.4* 32.7* 32.1*  MCV 82.4 82.2 82.8 82.2 83.8  PLT 215 176 198 219 204   Cardiac Enzymes: No results for input(s): CKTOTAL, CKMB, CKMBINDEX, TROPONINI in the last 168 hours. BNP (last 3 results) No results for input(s): PROBNP in the last 8760 hours. CBG: Recent Labs  Lab 08/22/19 2025 08/22/19 2322 08/23/19 0408 08/23/19 0746 08/23/19 1351  GLUCAP 227* 161* 160* 122* 68*   D-Dimer: No results for input(s): DDIMER in the last 72 hours. Hgb A1c: No results for input(s): HGBA1C in the last 72 hours. Lipid Profile: No results for input(s): CHOL, HDL, LDLCALC, TRIG, CHOLHDL, LDLDIRECT in the last 72 hours. Thyroid function studies: No results for input(s): TSH, T4TOTAL, T3FREE, THYROIDAB in the last 72 hours.  Invalid input(s): FREET3 Anemia work up: No results for input(s): VITAMINB12, FOLATE, FERRITIN, TIBC, IRON, RETICCTPCT in the last  72 hours. Sepsis Labs: Recent Labs  Lab 08/16/19 2029 08/16/19 2029 08/16/19 2255 08/17/19 0243 08/19/19 0406 08/21/19 1400 08/23/19 0417  WBC 6.4   < >  --  6.6 11.3* 8.2 9.6  LATICACIDVEN 2.0*  --  1.7  --   --   --   --    <  > = values in this interval not displayed.    Microbiology Recent Results (from the past 240 hour(s))  Urine culture     Status: None   Collection Time: 08/16/19  8:23 PM   Specimen: In/Out Cath Urine  Result Value Ref Range Status   Specimen Description   Final    IN/OUT CATH URINE Performed at [facility], [address] [place], [place] [address]    Special Requests   Final    NONE Performed at [facility], [address] [place], [place] [address]    Culture   Final    NO GROWTH Performed at [facility], [place] [address], [place], [place] [address]    Report Status 08/18/2019 FINAL  Final  Blood Culture (routine x 2)     Status: None   Collection Time: 08/16/19  8:29 PM   Specimen: BLOOD  Result Value Ref Range Status   Specimen Description BLOOD BLOOD LEFT FOREARM  Final   Special Requests   Final    BOTTLES DRAWN AEROBIC AND ANAEROBIC Blood Culture adequate volume   Culture   Final    NO GROWTH 5 DAYS Performed at [facility], [address] [place], [place] [address]    Report Status 08/21/2019 FINAL  Final  Blood Culture (routine x 2)     Status: None   Collection Time: 08/16/19  8:29 PM   Specimen: BLOOD  Result Value Ref Range Status   Specimen Description BLOOD BLOOD LEFT ARM  Final   Special Requests   Final    BOTTLES DRAWN AEROBIC AND ANAEROBIC Blood Culture results may not be optimal due to an inadequate volume of blood received in culture bottles   Culture   Final    NO GROWTH 5 DAYS Performed at [facility], [address] [place],  [address]    Report Status 08/21/2019 FINAL  Final  Respiratory Panel by RT PCR (Flu A&B, Covid) - Urine, Clean Catch     Status: None   Collection Time: 08/16/19  8:47 PM   Specimen: Urine, Clean Catch  Result Value Ref Range Status   SARS Coronavirus 2 by RT PCR NEGATIVE NEGATIVE Final    Comment: (NOTE) SARS-CoV-2 target nucleic acids are NOT DETECTED. The  SARS-CoV-2 RNA is generally detectable in upper respiratoy specimens during the acute phase of infection. The lowest concentration of SARS-CoV-2 viral copies this assay can detect is 131 copies/mL. A negative result does not preclude SARS-Cov-2 infection and should not be used as the sole basis for treatment or other patient management decisions. A negative result may occur with  improper specimen collection/handling, submission of specimen other than nasopharyngeal swab, presence of viral mutation(s) within the areas targeted by this assay, and inadequate number of viral copies (<131 copies/mL). A negative result must be combined with clinical observations, patient history, and epidemiological information. The expected result is Negative. Fact Sheet for Patients:  PinkCheek.be Fact Sheet for Healthcare Providers:  GravelBags.it This test is not yet ap proved or cleared by the [place] FDA and  has been authorized for detection and/or diagnosis of SARS-CoV-2  by FDA under an Emergency Use Authorization (EUA). This EUA will remain  in effect (meaning this test can be used) for the duration of the COVID-19 declaration under Section 564(b)(1) of the Act, 21 U.S.C. section 360bbb-3(b)(1), unless the authorization is terminated or revoked sooner.    Influenza A by PCR NEGATIVE NEGATIVE Final   Influenza B by PCR NEGATIVE NEGATIVE Final    Comment: (NOTE) The Xpert Xpress SARS-CoV-2/FLU/RSV assay is intended as an aid in  the diagnosis of influenza from Nasopharyngeal swab specimens and  should not be used as a sole basis for treatment. Nasal washings and  aspirates are unacceptable for Xpert Xpress SARS-CoV-2/FLU/RSV  testing. Fact Sheet for Patients: PinkCheek.be Fact Sheet for Healthcare Providers: GravelBags.it This test is not yet approved or cleared by the [place] FDA and  has been authorized for detection and/or diagnosis of SARS-CoV-2 by  FDA under an Emergency Use Authorization (EUA). This EUA will remain  in effect (meaning this test can be used) for the duration of the  Covid-19 declaration under Section 564(b)(1) of the Act, 21  U.S.C. section 360bbb-3(b)(1), unless the authorization is  terminated or revoked. Performed at [facility], [address] [place], [place] [address]     Procedures and diagnostic studies:  No results found.  Medications:   . amLODipine  5 mg Oral Daily  . B-complex with vitamin C  1 tablet Per Tube QHS  . chlorhexidine  15 mL Mouth Rinse BID  . Chlorhexidine Gluconate Cloth  6 each Topical Daily  . cloNIDine  0.2 mg Transdermal Weekly  . feeding supplement (JEVITY 1.5 CAL/FIBER)  1,000 mL Oral Q24H  . furosemide  80 mg Oral QODAY  . heparin  5,000 Units Subcutaneous Q12H  . insulin aspart  0-6 Units Subcutaneous Q4H  . insulin glargine  10 Units Subcutaneous QHS  . mouth rinse  15 mL Mouth Rinse q12n4p   Continuous Infusions:    LOS: 7 days   Sirinity Outland  [facility]     08/23/2019, 3:16 PM

## 2019-08-23 NOTE — Consult Note (Signed)
PHARMACY CONSULT NOTE - FOLLOW UP  Pharmacy Consult for Electrolyte Monitoring and Replacement   Recent Labs: Potassium (mmol/L)  Date Value  08/23/2019 4.5   Magnesium (mg/dL)  Date Value  08/23/2019 2.4   Calcium (mg/dL)  Date Value  08/23/2019 7.7 (L)   Calcium, Total (PTH) (mg/dL)  Date Value  03/22/2019 6.3   Albumin (g/dL)  Date Value  08/21/2019 3.0 (L)   Phosphorus (mg/dL)  Date Value  08/23/2019 5.6 (H)   Sodium (mmol/L)  Date Value  08/23/2019 141     Assessment: Pharmacy is consulted to manage electrolytes in this 69 year old female admitted with ESRD on hemodialysis, as well as encephalopathy.  PMH is significant for DM and hypertension, and she encompasses a very small body habitus.  Patient currently receives furosemide 80 mg every other day.  Goal of Therapy:  Electrolytes WNL  Plan:  No replacement needed.  Will continue to monitor electrolytes for refeeding risk.  Will continue to defer to nephrology for electrolyte replacement.  Tawnya Crook ,PharmD 08/23/2019 7:13 AM

## 2019-08-24 LAB — AMMONIA: Ammonia: 11 umol/L (ref 9–35)

## 2019-08-24 LAB — GLUCOSE, CAPILLARY
Glucose-Capillary: 145 mg/dL — ABNORMAL HIGH (ref 70–99)
Glucose-Capillary: 153 mg/dL — ABNORMAL HIGH (ref 70–99)
Glucose-Capillary: 157 mg/dL — ABNORMAL HIGH (ref 70–99)
Glucose-Capillary: 161 mg/dL — ABNORMAL HIGH (ref 70–99)
Glucose-Capillary: 169 mg/dL — ABNORMAL HIGH (ref 70–99)
Glucose-Capillary: 206 mg/dL — ABNORMAL HIGH (ref 70–99)

## 2019-08-24 MED ORDER — EPOETIN ALFA 10000 UNIT/ML IJ SOLN
4000.0000 [IU] | INTRAMUSCULAR | Status: DC
  Administered 2019-08-26 – 2019-09-04 (×5): 4000 [IU] via INTRAVENOUS
  Filled 2019-08-24 (×2): qty 1

## 2019-08-24 NOTE — Progress Notes (Signed)
[facility]  ROUNDING NOTE   Subjective:   More alert today Conversation through [place] interpreter Denies any shortness of breath or nausea No leg edema Required IV fluid boluses during dialysis treatment yesterday NG tube in place with tube feeds at 40 cc/h    Objective:  Vital signs in last 24 hours:  Temp:  [98.2 F (36.8 C)-99.3 F (37.4 C)] 98.2 F (36.8 C) (05/02 0800) Pulse Rate:  [80-97] 93 (05/02 1000) Resp:  [9-25] 18 (05/02 1000) BP: (112-159)/(52-66) 144/60 (05/02 1100) SpO2:  [97 %-100 %] 100 % (05/02 1000)  Weight change:  Filed Weights   08/21/19 0500 08/22/19 0453 08/23/19 0429  Weight: 30.6 kg 29.6 kg 28.7 kg    Intake/Output: I/O last 3 completed shifts: In: 480 [NG/GT:480] Out: -600    Intake/Output this shift:  No intake/output data recorded.  Physical Exam: General: NAD, laying in bed  Head: Normocephalic, atraumatic. Moist oral mucosal membranes  Eyes: Anicteric,   Lungs:  Clear to auscultation  Heart: Regular rate and rhythm  Abdomen:  Soft, nontender  Extremities:  no peripheral edema.  Neurologic:  Alert, oriented to self, able to follow simple commands  Skin: No lesions  Access: Right IJ PermCath    Basic Metabolic Panel: Recent Labs  Lab 08/19/19 0406 08/19/19 0406 08/20/19 1415 08/21/19 0447 08/21/19 1400 08/22/19 0423 08/23/19 0417  NA 142  --   --   --  145 139 141  K 4.3  --   --   --  3.5 4.5 4.5  CL 104  --   --   --  100 97* 97*  CO2 17*  --   --   --  27 32 32  GLUCOSE 131*  --   --   --  185* 267* 177*  BUN 96*  --   --   --  93* 36* 66*  CREATININE 6.78*  --   --   --  6.81* 3.23* 4.84*  CALCIUM 7.7*   < >  --   --  7.8* 7.9* 7.7*  MG 2.7*  --  2.3 2.6*  --  2.0 2.4  PHOS 7.0*   < > 7.0* 7.2* 7.3* 4.3 5.6*   < > = values in this interval not displayed.    Liver Function Tests: Recent Labs  Lab 08/19/19 0406 08/21/19 1400  ALBUMIN 3.4* 3.0*   No results for input(s): LIPASE, AMYLASE  in the last 168 hours. Recent Labs  Lab 08/22/19 0423 08/23/19 0417 08/24/19 0403  AMMONIA 32 20 11    CBC: Recent Labs  Lab 08/19/19 0406 08/21/19 1400 08/23/19 0417  WBC 11.3* 8.2 9.6  HGB 9.6* 10.7* 10.2*  HCT 29.4* 32.7* 32.1*  MCV 82.8 82.2 83.8  PLT 198 219 204    Cardiac Enzymes: No results for input(s): CKTOTAL, CKMB, CKMBINDEX, TROPONINI in the last 168 hours.  BNP: Invalid input(s): POCBNP  CBG: Recent Labs  Lab 08/23/19 1938 08/23/19 2334 08/24/19 0436 08/24/19 0720 08/24/19 1129  GLUCAP 213* 170* 157* 153* 145*    Microbiology: Results for orders placed or performed during the hospital encounter of 08/16/19  Urine culture     Status: None   Collection Time: 08/16/19  8:23 PM   Specimen: In/Out Cath Urine  Result Value Ref Range Status   Specimen Description   Final    IN/OUT CATH URINE Performed at [facility], [address], [place], [place] [address]    Special Requests  Final    NONE Performed at [facility], [address], [place], [place] [address]    Culture   Final    NO GROWTH Performed at [facility], [place] [address], [place], [place] [address]    Report Status 08/18/2019 FINAL  Final  Blood Culture (routine x 2)     Status: None   Collection Time: 08/16/19  8:29 PM   Specimen: BLOOD  Result Value Ref Range Status   Specimen Description BLOOD BLOOD LEFT FOREARM  Final   Special Requests   Final    BOTTLES DRAWN AEROBIC AND ANAEROBIC Blood Culture adequate volume   Culture   Final    NO GROWTH 5 DAYS Performed at [facility], [address] [place], [place] [address]    Report Status 08/21/2019 FINAL  Final  Blood Culture (routine x 2)     Status: None   Collection Time: 08/16/19  8:29 PM   Specimen: BLOOD  Result Value Ref Range Status   Specimen Description BLOOD BLOOD LEFT ARM  Final   Special Requests   Final    BOTTLES DRAWN AEROBIC AND ANAEROBIC Blood Culture results may  not be optimal due to an inadequate volume of blood received in culture bottles   Culture   Final    NO GROWTH 5 DAYS Performed at [facility], [address] [place], [place] [address]    Report Status 08/21/2019 FINAL  Final  Respiratory Panel by RT PCR (Flu A&B, Covid) - Urine, Clean Catch     Status: None   Collection Time: 08/16/19  8:47 PM   Specimen: Urine, Clean Catch  Result Value Ref Range Status   SARS Coronavirus 2 by RT PCR NEGATIVE NEGATIVE Final    Comment: (NOTE) SARS-CoV-2 target nucleic acids are NOT DETECTED. The SARS-CoV-2 RNA is generally detectable in upper respiratoy specimens during the acute phase of infection. The lowest concentration of SARS-CoV-2 viral copies this assay can detect is 131 copies/mL. A negative result does not preclude SARS-Cov-2 infection and should not be used as the sole basis for treatment or other patient management decisions. A negative result may occur with  improper specimen collection/handling, submission of specimen other than nasopharyngeal swab, presence of viral mutation(s) within the areas targeted by this assay, and inadequate number of viral copies (<131 copies/mL). A negative result must be combined with clinical observations, patient history, and epidemiological information. The expected result is Negative. Fact Sheet for Patients:  PinkCheek.be Fact Sheet for Healthcare Providers:  GravelBags.it This test is not yet ap proved or cleared by the [place] FDA and  has been authorized for detection and/or diagnosis of SARS-CoV-2 by FDA under an Emergency Use Authorization (EUA). This EUA will remain  in effect (meaning this test can be used) for the duration of the COVID-19 declaration under Section 564(b)(1) of the Act, 21 U.S.C. section 360bbb-3(b)(1), unless the authorization is terminated or revoked sooner.    Influenza A by PCR NEGATIVE NEGATIVE  Final   Influenza B by PCR NEGATIVE NEGATIVE Final    Comment: (NOTE) The Xpert Xpress SARS-CoV-2/FLU/RSV assay is intended as an aid in  the diagnosis of influenza from Nasopharyngeal swab specimens and  should not be used as a sole basis for treatment. Nasal washings and  aspirates are unacceptable for Xpert Xpress SARS-CoV-2/FLU/RSV  testing. Fact Sheet for Patients: PinkCheek.be Fact Sheet for Healthcare Providers: GravelBags.it This test is not yet approved or cleared by the [place]  FDA and  has been authorized for detection and/or diagnosis of SARS-CoV-2 by  FDA under an Emergency Use Authorization (EUA). This EUA will remain  in effect (meaning this test can be used) for the duration of the  Covid-19 declaration under Section 564(b)(1) of the Act, 21  U.S.C. section 360bbb-3(b)(1), unless the authorization is  terminated or revoked. Performed at [facility], [address] [place], [place] [address]     Coagulation Studies: No results for input(s): LABPROT, INR in the last 72 hours.  Urinalysis: No results for input(s): COLORURINE, LABSPEC, PHURINE, GLUCOSEU, HGBUR, BILIRUBINUR, KETONESUR, PROTEINUR, UROBILINOGEN, NITRITE, LEUKOCYTESUR in the last 72 hours.  Invalid input(s): APPERANCEUR    Imaging: No results found.   Medications:    . amLODipine  5 mg Oral Daily  . B-complex with vitamin C  1 tablet Per Tube QHS  . chlorhexidine  15 mL Mouth Rinse BID  . Chlorhexidine Gluconate Cloth  6 each Topical Daily  . cloNIDine  0.2 mg Transdermal Weekly  . [START ON 08/26/2019] epoetin (EPOGEN/PROCRIT) injection  4,000 Units Intravenous Q T,Th,Sa-HD  . feeding supplement (JEVITY 1.5 CAL/FIBER)  1,000 mL Oral Q24H  . furosemide  80 mg Oral QODAY  . heparin  5,000 Units Subcutaneous Q12H  . insulin aspart  0-6 Units Subcutaneous Q4H  . insulin glargine  10 Units Subcutaneous QHS  . mouth rinse   15 mL Mouth Rinse q12n4p   acetaminophen **OR** acetaminophen, hydrALAZINE, ipratropium-albuterol, labetalol, magnesium hydroxide, ondansetron **OR** ondansetron (ZOFRAN) IV  Assessment/ Plan:  [name] is a 69 y.o. Hispanic female with end stage renal disease on hemodialysis, diabetes mellitus, hypertension, who was admitted 08/16/2019 for Encephalopathy [G93.40] Sepsis ([place]) [A41.9] Fever, unspecified fever cause [R50.9] Altered mental status, unspecified altered mental status type [R41.82]  [facility] ([place]) [facility] [place] TTS Right AVF  1. End stage renal disease: TTS schedule.  Patient has moved to [facility] and will be transitioning to [place] and will be followed by [facility] Next hemodialysis will be scheduled for Tuesday  2. Hypertension: Home regimen of amlodipine and furosemide. PRN hydralazine BP Readings from Last 3 Encounters:  08/24/19 (!) 144/60  08/14/19 (!) 161/100  07/30/19 (!) 185/79    3. Anemia of chronic kidney disease:   - EPO with HD treatment.  Lab Results  Component Value Date   HGB 10.2 (L) 08/23/2019     4. Secondary Hyperparathyroidism: calcium and phosphorus at goal. Not currently on binders.  Lab Results  Component Value Date   PTH 269 (H) 03/22/2019   PTH Comment 03/22/2019   CALCIUM 7.7 (L) 08/23/2019   CAION 0.90 (L) 06/09/2019   PHOS 5.6 (H) 08/23/2019     5. Encephalopathy:  Evaluated by neurologist 4/30 Depressed mental status is felt to be secondary to metabolic encephalopathy MRI brain no acute changes EEG consistent with severe metabolic encephalopathy Improved today    LOS: 8 Maedell Hedger 5/2/20211:57 PM

## 2019-08-24 NOTE — Consult Note (Signed)
PHARMACY CONSULT NOTE - FOLLOW UP  Pharmacy Consult for Electrolyte Monitoring and Replacement   Recent Labs: Potassium (mmol/L)  Date Value  08/23/2019 4.5   Magnesium (mg/dL)  Date Value  08/23/2019 2.4   Calcium (mg/dL)  Date Value  08/23/2019 7.7 (L)   Calcium, Total (PTH) (mg/dL)  Date Value  03/22/2019 6.3   Albumin (g/dL)  Date Value  08/21/2019 3.0 (L)   Phosphorus (mg/dL)  Date Value  08/23/2019 5.6 (H)   Sodium (mmol/L)  Date Value  08/23/2019 141     Assessment: Pharmacy is consulted to manage electrolytes in this 69 year old female admitted with ESRD on hemodialysis, as well as encephalopathy.  PMH is significant for DM and hypertension, and she encompasses a very small body habitus.  Patient currently receives furosemide 80 mg every other day.  Goal of Therapy:  Electrolytes WNL  Plan:  Will continue to defer to nephrology for electrolyte replacement. Labs as ordered by MD.  Tawnya Crook ,PharmD 08/24/2019 7:35 AM

## 2019-08-24 NOTE — Progress Notes (Signed)
With the help of the interpreter this morning I was able to assess this pt's mental status. Pt is slightly confused; she is able to tell me who she is and that she is going to a nursing home once she leaves the hospital, but is unaware of where she is currently. Pt was also able to tell me that she had dialysis yesterday and she has been a dialysis pt since she "took all the pills" several years ago. Pt denies pain at this time.

## 2019-08-24 NOTE — Progress Notes (Signed)
Progress Note    Amber Dixon  EHU:[id number] DOB: 1951-03-05  DOA: 08/16/2019 PCP: Patient, No Pcp Per      Brief Narrative:    Medical records reviewed and are as summarized below:        Assessment/Plan:   Principal Problem:   Encephalopathy Active Problems:   ESRD (end stage renal disease) on dialysis ([place])   Altered mental status   DNR (do not resuscitate) discussion   Palliative care by specialist   Protein-calorie malnutrition, severe   SIRS: No clear source of infection so far.  Antibiotics have been discontinued  Acute toxic metabolic encephalopathy: Slowly improving.  Work-up including CT head, MRI brain, TFT, ammonia and cultures unrevealing.  No lumbar puncture for now per discussion between family and neurologist.  Hypertension/ s/p hypertensive emergency: Continue amlodipine and clonidine patch.  Type 2 diabetes mellitus with hyperglycemia: This is likely from enteral nutrition.  Continue low-dose Lantus.  Continue NovoLog as needed.  ESRD/bone marrow disorder: On HD TTS. Follow-up with nephrologist  Anemia of chronic disease: H&H stable. Monitor H&H  Underweight/severe protein calorie malnutrition:Body mass index is 14.96 kg/m.   NG tube was placed on 08/20/2019.  Continue enteral nutrition.  Consult speech therapist for swallow evaluation today the patient is more awake  Body mass index is 12.78 kg/m.  (Underweight)        Family Communication/Anticipated D/C date and plan/Code Status   DVT prophylaxis: Heparin Code Status: Full code Family Communication: None. Disposition Plan:    Status is: Inpatient  Remains inpatient appropriate because:Inpatient level of care appropriate due to severity of illness   Dispo: The patient is from: Home              Anticipated d/c is to: SNF              Anticipated d/c date is: 2 days              Patient currently is not medically stable to  d/c.            Subjective:   Patient is more awake but unable to provide any history.  Objective:    Vitals:   08/20/19 1600 08/20/19 1900 08/20/19 2000 08/20/19 2100  BP: 131/62 (!) 145/65 (!) 160/62 (!) 151/65  Pulse: 97 99 98 93  Resp: 17 20 18 18   Temp:  98.4 F (36.9 C)    TempSrc:  Oral    SpO2: 97% 98% 97% 100%  Weight:      Height:       No data found.  No intake or output data in the 24 hours ending 08/24/19 1608 Filed Weights   08/21/19 0500 08/22/19 0453 08/23/19 0429  Weight: 30.6 kg 29.6 kg 28.7 kg    Exam:  GEN: NAD SKIN: Warm and dry EYES: No pallor or icterus ENT: MMM, nasogastric tube in place for enteral nutrition CV: RRR PULM: CTA B ABD: soft, ND, NT, +BS CNS: Awake and alert.  She spoke some words today but she's still confused. EXT: No edema or tenderness.  ?Chronic right knee deformity.   Data Reviewed:   I have personally reviewed following labs and imaging studies:  Labs: Labs show the following:   Basic Metabolic Panel: Recent Labs  Lab 08/19/19 0406 08/19/19 0406 08/20/19 1415 08/21/19 0447 08/21/19 1400 08/21/19 1400 08/22/19 0423 08/23/19 0417  NA 142  --   --   --  145  --  139  141  K 4.3   < >  --   --  3.5   < > 4.5 4.5  CL 104  --   --   --  100  --  97* 97*  CO2 17*  --   --   --  27  --  32 32  GLUCOSE 131*  --   --   --  185*  --  267* 177*  BUN 96*  --   --   --  93*  --  36* 66*  CREATININE 6.78*  --   --   --  6.81*  --  3.23* 4.84*  CALCIUM 7.7*  --   --   --  7.8*  --  7.9* 7.7*  MG 2.7*  --  2.3 2.6*  --   --  2.0 2.4  PHOS 7.0*   < > 7.0* 7.2* 7.3*  --  4.3 5.6*   < > = values in this interval not displayed.   GFR Estimated Creatinine Clearance: 4.9 mL/min (A) (by C-G formula based on SCr of 4.84 mg/dL (H)). Liver Function Tests: Recent Labs  Lab 08/19/19 0406 08/21/19 1400  ALBUMIN 3.4* 3.0*   No results for input(s): LIPASE, AMYLASE in the last 168 hours. Recent Labs  Lab  08/20/19 0444 08/21/19 0447 08/22/19 0423 08/23/19 0417 08/24/19 0403  AMMONIA 25 23 32 20 11   Coagulation profile No results for input(s): INR, PROTIME in the last 168 hours.  CBC: Recent Labs  Lab 08/19/19 0406 08/21/19 1400 08/23/19 0417  WBC 11.3* 8.2 9.6  HGB 9.6* 10.7* 10.2*  HCT 29.4* 32.7* 32.1*  MCV 82.8 82.2 83.8  PLT 198 219 204   Cardiac Enzymes: No results for input(s): CKTOTAL, CKMB, CKMBINDEX, TROPONINI in the last 168 hours. BNP (last 3 results) No results for input(s): PROBNP in the last 8760 hours. CBG: Recent Labs  Lab 08/23/19 1938 08/23/19 2334 08/24/19 0436 08/24/19 0720 08/24/19 1129  GLUCAP 213* 170* 157* 153* 145*   D-Dimer: No results for input(s): DDIMER in the last 72 hours. Hgb A1c: No results for input(s): HGBA1C in the last 72 hours. Lipid Profile: No results for input(s): CHOL, HDL, LDLCALC, TRIG, CHOLHDL, LDLDIRECT in the last 72 hours. Thyroid function studies: No results for input(s): TSH, T4TOTAL, T3FREE, THYROIDAB in the last 72 hours.  Invalid input(s): FREET3 Anemia work up: No results for input(s): VITAMINB12, FOLATE, FERRITIN, TIBC, IRON, RETICCTPCT in the last 72 hours. Sepsis Labs: Recent Labs  Lab 08/19/19 0406 08/21/19 1400 08/23/19 0417  WBC 11.3* 8.2 9.6    Microbiology Recent Results (from the past 240 hour(s))  Urine culture     Status: None   Collection Time: 08/16/19  8:23 PM   Specimen: In/Out Cath Urine  Result Value Ref Range Status   Specimen Description   Final    IN/OUT CATH URINE Performed at [facility], [address] [place], [place] [address]    Special Requests   Final    NONE Performed at [facility], [address] [place] [address], [place], [place] [address]    Culture   Final    NO GROWTH Performed at [facility], [place] [address], [place], [place] [address]    Report Status 08/18/2019 FINAL  Final  Blood Culture (routine x 2)     Status: None   Collection Time:  08/16/19  8:29 PM   Specimen: BLOOD  Result Value Ref Range Status   Specimen Description  BLOOD BLOOD LEFT FOREARM  Final   Special Requests   Final    BOTTLES DRAWN AEROBIC AND ANAEROBIC Blood Culture adequate volume   Culture   Final    NO GROWTH 5 DAYS Performed at [facility], [place], [address] [place], [place] [address]    Report Status 08/21/2019 FINAL  Final  Blood Culture (routine x 2)     Status: None   Collection Time: 08/16/19  8:29 PM   Specimen: BLOOD  Result Value Ref Range Status   Specimen Description BLOOD BLOOD LEFT ARM  Final   Special Requests   Final    BOTTLES DRAWN AEROBIC AND ANAEROBIC Blood Culture results may not be optimal due to an inadequate volume of blood received in culture bottles   Culture   Final    NO GROWTH 5 DAYS Performed at [facility], [address] [place], [place] [address]    Report Status 08/21/2019 FINAL  Final  Respiratory Panel by RT PCR (Flu A&B, Covid) - Urine, Clean Catch     Status: None   Collection Time: 08/16/19  8:47 PM   Specimen: Urine, Clean Catch  Result Value Ref Range Status   SARS Coronavirus 2 by RT PCR NEGATIVE NEGATIVE Final    Comment: (NOTE) SARS-CoV-2 target nucleic acids are NOT DETECTED. The SARS-CoV-2 RNA is generally detectable in upper respiratoy specimens during the acute phase of infection. The lowest concentration of SARS-CoV-2 viral copies this assay can detect is 131 copies/mL. A negative result does not preclude SARS-Cov-2 infection and should not be used as the sole basis for treatment or other patient management decisions. A negative result may occur with  improper specimen collection/handling, submission of specimen other than nasopharyngeal swab, presence of viral mutation(s) within the areas targeted by this assay, and inadequate number of viral copies (<131 copies/mL). A negative result must be combined with clinical observations, patient history, and epidemiological  information. The expected result is Negative. Fact Sheet for Patients:  PinkCheek.be Fact Sheet for Healthcare Providers:  GravelBags.it This test is not yet ap proved or cleared by the [place] FDA and  has been authorized for detection and/or diagnosis of SARS-CoV-2 by FDA under an Emergency Use Authorization (EUA). This EUA will remain  in effect (meaning this test can be used) for the duration of the COVID-19 declaration under Section 564(b)(1) of the Act, 21 U.S.C. section 360bbb-3(b)(1), unless the authorization is terminated or revoked sooner.    Influenza A by PCR NEGATIVE NEGATIVE Final   Influenza B by PCR NEGATIVE NEGATIVE Final    Comment: (NOTE) The Xpert Xpress SARS-CoV-2/FLU/RSV assay is intended as an aid in  the diagnosis of influenza from Nasopharyngeal swab specimens and  should not be used as a sole basis for treatment. Nasal washings and  aspirates are unacceptable for Xpert Xpress SARS-CoV-2/FLU/RSV  testing. Fact Sheet for Patients: PinkCheek.be Fact Sheet for Healthcare Providers: GravelBags.it This test is not yet approved or cleared by the [place] FDA and  has been authorized for detection and/or diagnosis of SARS-CoV-2 by  FDA under an Emergency Use Authorization (EUA). This EUA will remain  in effect (meaning this test can be used) for the duration of the  Covid-19 declaration under Section 564(b)(1) of the Act, 21  U.S.C. section 360bbb-3(b)(1), unless the authorization is  terminated or revoked. Performed at [facility], [address] [place], [place] [address]     Procedures and diagnostic studies:  No results found.  Medications:   . amLODipine  5 mg Oral Daily  . B-complex with vitamin C  1 tablet Per Tube QHS  . chlorhexidine  15 mL Mouth Rinse BID  . Chlorhexidine Gluconate Cloth  6 each Topical  Daily  . cloNIDine  0.2 mg Transdermal Weekly  . [START ON 08/26/2019] epoetin (EPOGEN/PROCRIT) injection  4,000 Units Intravenous Q T,Th,Sa-HD  . feeding supplement (JEVITY 1.5 CAL/FIBER)  1,000 mL Oral Q24H  . furosemide  80 mg Oral QODAY  . heparin  5,000 Units Subcutaneous Q12H  . insulin aspart  0-6 Units Subcutaneous Q4H  . insulin glargine  10 Units Subcutaneous QHS  . mouth rinse  15 mL Mouth Rinse q12n4p   Continuous Infusions:    LOS: 8 days   Amber Dixon  [facility]     08/24/2019, 4:08 PM

## 2019-08-25 LAB — GLUCOSE, CAPILLARY
Glucose-Capillary: 106 mg/dL — ABNORMAL HIGH (ref 70–99)
Glucose-Capillary: 133 mg/dL — ABNORMAL HIGH (ref 70–99)
Glucose-Capillary: 152 mg/dL — ABNORMAL HIGH (ref 70–99)
Glucose-Capillary: 170 mg/dL — ABNORMAL HIGH (ref 70–99)
Glucose-Capillary: 199 mg/dL — ABNORMAL HIGH (ref 70–99)
Glucose-Capillary: 83 mg/dL (ref 70–99)
Glucose-Capillary: 89 mg/dL (ref 70–99)

## 2019-08-25 MED ORDER — CHLORHEXIDINE GLUCONATE CLOTH 2 % EX PADS
6.0000 | MEDICATED_PAD | Freq: Every day | CUTANEOUS | Status: DC
  Administered 2019-08-25 – 2019-09-05 (×11): 6 via TOPICAL

## 2019-08-25 NOTE — Evaluation (Signed)
Clinical/Bedside Swallow Evaluation Patient Details  Name: Kemiyah Tarazon MRN: [id number] Date of Birth: 06-07-1950  Today's Date: 08/25/2019 Time: SLP Start Time (ACUTE ONLY): 1100 SLP Stop Time (ACUTE ONLY): 1200 SLP Time Calculation (min) (ACUTE ONLY): 60 min  Past Medical History:  Past Medical History:  Diagnosis Date  . Diabetes mellitus without complication ([place])   . Hypertension   . Renal disorder    Past Surgical History:  Past Surgical History:  Procedure Laterality Date  . AV FISTULA PLACEMENT Right 04/24/2019   Procedure: ARTERIOVENOUS (AV) FISTULA CREATION RIGHT ARM;  Surgeon: Waynetta Sandy, MD;  Location: [facility];  Service: Vascular;  Laterality: Right;  . AV FISTULA PLACEMENT Right   . HIP FRACTURE SURGERY Right    in [place]  . IR FLUORO GUIDE CV LINE RIGHT  04/02/2019  . IR FLUORO GUIDE CV LINE RIGHT  04/13/2019  . IR US GUIDE VASC ACCESS RIGHT  04/02/2019  . LEG SURGERY     HPI:  Pt is a 69 y.o. Hispanic female with a known history of type 2 diabetes mellitus, hypertension and chronic kidney disease, who presented to the emergency room with acute onset of altered mental status with confusion and decreased responsiveness with associated elevated blood pressure today.  The patient was seen in [facility] on 4/22 for elevated blood pressure and similar confusion after hemodialysis which has resolved with improved blood pressure.  Today according to her son she could not speak.  The patient is only Spanish-speaking.  Pt admitted w/ dx of Acute Encephalopathy.  She required an NG tube for nutrition d/t reduced alertness and inability to participate safely in po intake.     Assessment / Plan / Recommendation Clinical Impression  Pt seen for BSE today; Spanish Interpreter present in room. Pt is more awake and engages in verbal communication w/ others, follows commands w/ cues. She appears to present w/ adequate oropharyngeal phase swallowing  function w/ no overt clinical s/s of aspiration noted during po trials. Pt is Edentulous. No decline in presentation or respiratory status during/post po trials; O2 sats remained 99-100% during/post oral intake and vocal quality was clear. Oral phase appeared grossly adequate for bolus control of trials; solids were not assessed d/t pt's recent NG tube and Edentulous status. Pt presents w/ increased weakness overall; requires full positioning upright in bed. OM exam appeared grossly [facility] for lingual/labial movements; no unilateral weakness noted. Recommend a pureed diet w/ thin liquids; general aspiration precautions; Pills in Puree(crushed as needed) for safer, easier swallowing as needed. Recommend thorough oral care and support w/ feeding at all meals w/ upright positioning and head supported forward. ST services will continue to f/u w/ toleration of diet next 1-3 days. NSG updated, agreed. NSG then pulled NG for diet order placed.  SLP Visit Diagnosis: Dysphagia, oral phase (R13.11)(edentulous)    Aspiration Risk  Mild aspiration risk;Risk for inadequate nutrition/hydration(reduced following precautions)    Diet Recommendation  Pureed diet w/ thin liquids; general aspiration precautions; support at all meals w/ feeding and positioning upright for safe oral intake.   Medication Administration: Crushed with puree(as needed for easier, safer swallowing)    Other  Recommendations Recommended Consults: (Dietician f/u) Oral Care Recommendations: Oral care BID;Oral care before and after PO;Staff/trained caregiver to provide oral care Other Recommendations: (n/a)   Follow up Recommendations None(TBD)      Frequency and Duration min 3x week  1 week       Prognosis Prognosis for  Safe Diet Advancement: Fair Barriers to Reach Goals: (Edentulous status; lengthy admit)      Swallow Study   General Date of Onset: 08/17/19 HPI: Pt is a 69 y.o. Hispanic female with a known history of type 2 diabetes  mellitus, hypertension and chronic kidney disease, who presented to the emergency room with acute onset of altered mental status with confusion and decreased responsiveness with associated elevated blood pressure today.  The patient was seen in [facility] on 4/22 for elevated blood pressure and similar confusion after hemodialysis which has resolved with improved blood pressure.  Today according to her son she could not speak.  The patient is only Spanish-speaking.  Pt admitted w/ dx of Acute Encephalopathy.  She required an NG tube for nutrition d/t reduced alertness and inability to participate safely in po intake.   Type of Study: Bedside Swallow Evaluation Previous Swallow Assessment: none Diet Prior to this Study: NPO;NG Tube Temperature Spikes Noted: No(wbc 9.6) Respiratory Status: Room air History of Recent Intubation: No Behavior/Cognition: Alert;Cooperative;Pleasant mood;Distractible;Requires cueing(min; Interpreter) Oral Cavity Assessment: Dry(min ) Oral Care Completed by SLP: Yes Oral Cavity - Dentition: Edentulous Vision: Functional for self-feeding Self-Feeding Abilities: Able to feed self;Needs assist;Needs set up;Total assist Patient Positioning: Upright in bed(needed full positioning) Baseline Vocal Quality: Normal Volitional Cough: (Fair) Volitional Swallow: Able to elicit    Oral/Motor/Sensory Function Overall Oral Motor/Sensory Function: Within functional limits   Ice Chips Ice chips: Within functional limits Presentation: Spoon(fed; 3 trials)   Thin Liquid Thin Liquid: Within functional limits Presentation: Cup;Spoon;Straw(4 trials via each; multiple sips via straw) Other Comments: pinched straw to monitor/control for multiple sips    Nectar Thick Nectar Thick Liquid: Not tested   Honey Thick Honey Thick Liquid: Not tested   Puree Puree: Within functional limits Presentation: Spoon(fed; 10+ trials)   Solid     Solid: Not tested Other Comments: pt  edentulous       Orinda Kenner, MS, CCC-SLP Watson,Katherine 08/25/2019,5:14 PM

## 2019-08-25 NOTE — Consult Note (Signed)
PHARMACY CONSULT NOTE - FOLLOW UP  Pharmacy Consult for Electrolyte Monitoring and Replacement   Recent Labs: Potassium (mmol/L)  Date Value  08/23/2019 4.5   Magnesium (mg/dL)  Date Value  08/23/2019 2.4   Calcium (mg/dL)  Date Value  08/23/2019 7.7 (L)   Calcium, Total (PTH) (mg/dL)  Date Value  03/22/2019 6.3   Albumin (g/dL)  Date Value  08/21/2019 3.0 (L)   Phosphorus (mg/dL)  Date Value  08/23/2019 5.6 (H)   Sodium (mmol/L)  Date Value  08/23/2019 141     Assessment: Pharmacy is consulted to manage electrolytes in this 69 year old female admitted with ESRD on hemodialysis, as well as encephalopathy.  PMH is significant for DM and hypertension, and she encompasses a very small body habitus.  Patient currently receives furosemide 80 mg every other day.  Goal of Therapy:  Electrolytes WNL  Plan:  Will continue to defer to nephrology for electrolyte replacement.   Labs as ordered.   Keeara Frees L, RPh 08/25/2019 5:32 PM

## 2019-08-25 NOTE — TOC Progression Note (Signed)
Transition of Care [facility]) - Progression Note    Patient Details  Name: Amber Dixon MRN: [id number] Date of Birth: 1950/08/31  Transition of [facility] CM/SW [place], LCSW Phone Number: 08/25/2019, 11:58 AM  Clinical Narrative:   Patient and family wanting SNF at discharge. Patient is uninsured. CSW Advice worker to ask if patient would be eligible for Medicaid. CSW will continue to follow.     Expected Discharge Plan: Home/Self Care Barriers to Discharge: Continued Medical Work up  Expected Discharge Plan and Services Expected Discharge Plan: Home/Self Care       Living arrangements for the past 2 months: Single Family Home                                       Social Determinants of Health (SDOH) Interventions    Readmission Risk Interventions Readmission Risk Prevention Plan 08/18/2019  Transportation Screening Complete  Medication Review (RN Care Manager) Complete  PCP or Specialist appointment within 3-5 days of discharge Complete  HRI or [place] Complete

## 2019-08-25 NOTE — Progress Notes (Signed)
[facility]  ROUNDING NOTE   Subjective:   More alert today Conversation through [place] interpreter Denies any shortness of breath or nausea No leg edema Required IV fluid boluses during dialysis treatment yesterday NG tube has been removed  Objective:  Vital signs in last 24 hours:  Temp:  [98.1 F (36.7 C)-98.5 F (36.9 C)] 98.1 F (36.7 C) (05/03 0800) Pulse Rate:  [82-91] 91 (05/03 1100) Resp:  [13-21] 14 (05/03 1100) BP: (120-172)/(54-91) 155/68 (05/03 1100) SpO2:  [98 %-100 %] 99 % (05/03 1100) Weight:  [30.9 kg] 30.9 kg (05/03 0500)  Weight change:  Filed Weights   08/22/19 0453 08/23/19 0429 08/25/19 0500  Weight: 29.6 kg 28.7 kg 30.9 kg    Intake/Output: No intake/output data recorded.   Intake/Output this shift:  No intake/output data recorded.  Physical Exam: General: NAD, laying in bed  Head: Normocephalic, atraumatic. Moist oral mucosal membranes  Eyes: Anicteric,   Lungs:  Clear to auscultation  Heart: Regular rate and rhythm  Abdomen:  Soft, nontender  Extremities:  no peripheral edema.  Neurologic:  Alert, oriented to self, able to follow simple commands  Skin: No lesions  Access: Right IJ PermCath    Basic Metabolic Panel: Recent Labs  Lab 08/19/19 0406 08/19/19 0406 08/20/19 1415 08/21/19 0447 08/21/19 1400 08/22/19 0423 08/23/19 0417  NA 142  --   --   --  145 139 141  K 4.3  --   --   --  3.5 4.5 4.5  CL 104  --   --   --  100 97* 97*  CO2 17*  --   --   --  27 32 32  GLUCOSE 131*  --   --   --  185* 267* 177*  BUN 96*  --   --   --  93* 36* 66*  CREATININE 6.78*  --   --   --  6.81* 3.23* 4.84*  CALCIUM 7.7*   < >  --   --  7.8* 7.9* 7.7*  MG 2.7*  --  2.3 2.6*  --  2.0 2.4  PHOS 7.0*   < > 7.0* 7.2* 7.3* 4.3 5.6*   < > = values in this interval not displayed.    Liver Function Tests: Recent Labs  Lab 08/19/19 0406 08/21/19 1400  ALBUMIN 3.4* 3.0*   No results for input(s): LIPASE, AMYLASE in the last  168 hours. Recent Labs  Lab 08/22/19 0423 08/23/19 0417 08/24/19 0403  AMMONIA 32 20 11    CBC: Recent Labs  Lab 08/19/19 0406 08/21/19 1400 08/23/19 0417  WBC 11.3* 8.2 9.6  HGB 9.6* 10.7* 10.2*  HCT 29.4* 32.7* 32.1*  MCV 82.8 82.2 83.8  PLT 198 219 204    Cardiac Enzymes: No results for input(s): CKTOTAL, CKMB, CKMBINDEX, TROPONINI in the last 168 hours.  BNP: Invalid input(s): POCBNP  CBG: Recent Labs  Lab 08/24/19 1919 08/24/19 2354 08/25/19 0359 08/25/19 0730 08/25/19 1142  GLUCAP 161* 206* 199* 133* 170*    Microbiology: Results for orders placed or performed during the hospital encounter of 08/16/19  Urine culture     Status: None   Collection Time: 08/16/19  8:23 PM   Specimen: In/Out Cath Urine  Result Value Ref Range Status   Specimen Description   Final    IN/OUT CATH URINE Performed at [facility], [address] [place], [place] [address]    Special Requests   Final    NONE  Performed at [facility], [address] [place], [place] [address]    Culture   Final    NO GROWTH Performed at [facility], [place] [address] [place], [place] [address]    Report Status 08/18/2019 FINAL  Final  Blood Culture (routine x 2)     Status: None   Collection Time: 08/16/19  8:29 PM   Specimen: BLOOD  Result Value Ref Range Status   Specimen Description BLOOD BLOOD LEFT FOREARM  Final   Special Requests   Final    BOTTLES DRAWN AEROBIC AND ANAEROBIC Blood Culture adequate volume   Culture   Final    NO GROWTH 5 DAYS Performed at [facility], [address] [place], [place] [address]    Report Status 08/21/2019 FINAL  Final  Blood Culture (routine x 2)     Status: None   Collection Time: 08/16/19  8:29 PM   Specimen: BLOOD  Result Value Ref Range Status   Specimen Description BLOOD BLOOD LEFT ARM  Final   Special Requests   Final    BOTTLES DRAWN AEROBIC AND ANAEROBIC Blood Culture results may not be optimal  due to an inadequate volume of blood received in culture bottles   Culture   Final    NO GROWTH 5 DAYS Performed at [facility], [address] [place],  [address]    Report Status 08/21/2019 FINAL  Final  Respiratory Panel by RT PCR (Flu A&B, Covid) - Urine, Clean Catch     Status: None   Collection Time: 08/16/19  8:47 PM   Specimen: Urine, Clean Catch  Result Value Ref Range Status   SARS Coronavirus 2 by RT PCR NEGATIVE NEGATIVE Final    Comment: (NOTE) SARS-CoV-2 target nucleic acids are NOT DETECTED. The SARS-CoV-2 RNA is generally detectable in upper respiratoy specimens during the acute phase of infection. The lowest concentration of SARS-CoV-2 viral copies this assay can detect is 131 copies/mL. A negative result does not preclude SARS-Cov-2 infection and should not be used as the sole basis for treatment or other patient management decisions. A negative result may occur with  improper specimen collection/handling, submission of specimen other than nasopharyngeal swab, presence of viral mutation(s) within the areas targeted by this assay, and inadequate number of viral copies (<131 copies/mL). A negative result must be combined with clinical observations, patient history, and epidemiological information. The expected result is Negative. Fact Sheet for Patients:  PinkCheek.be Fact Sheet for Healthcare Providers:  GravelBags.it This test is not yet ap proved or cleared by the [place] FDA and  has been authorized for detection and/or diagnosis of SARS-CoV-2 by FDA under an Emergency Use Authorization (EUA). This EUA will remain  in effect (meaning this test can be used) for the duration of the COVID-19 declaration under Section 564(b)(1) of the Act, 21 U.S.C. section 360bbb-3(b)(1), unless the authorization is terminated or revoked sooner.    Influenza A by PCR NEGATIVE NEGATIVE Final    Influenza B by PCR NEGATIVE NEGATIVE Final    Comment: (NOTE) The Xpert Xpress SARS-CoV-2/FLU/RSV assay is intended as an aid in  the diagnosis of influenza from Nasopharyngeal swab specimens and  should not be used as a sole basis for treatment. Nasal washings and  aspirates are unacceptable for Xpert Xpress SARS-CoV-2/FLU/RSV  testing. Fact Sheet for Patients: PinkCheek.be Fact Sheet for Healthcare Providers: GravelBags.it This test is not yet approved or cleared by the [place] FDA and  has been  authorized for detection and/or diagnosis of SARS-CoV-2 by  FDA under an Emergency Use Authorization (EUA). This EUA will remain  in effect (meaning this test can be used) for the duration of the  Covid-19 declaration under Section 564(b)(1) of the Act, 21  U.S.C. section 360bbb-3(b)(1), unless the authorization is  terminated or revoked. Performed at [facility], [address] [place], [place] [address]     Coagulation Studies: No results for input(s): LABPROT, INR in the last 72 hours.  Urinalysis: No results for input(s): COLORURINE, LABSPEC, PHURINE, GLUCOSEU, HGBUR, BILIRUBINUR, KETONESUR, PROTEINUR, UROBILINOGEN, NITRITE, LEUKOCYTESUR in the last 72 hours.  Invalid input(s): APPERANCEUR    Imaging: No results found.   Medications:    . amLODipine  5 mg Oral Daily  . B-complex with vitamin C  1 tablet Per Tube QHS  . chlorhexidine  15 mL Mouth Rinse BID  . Chlorhexidine Gluconate Cloth  6 each Topical Daily  . cloNIDine  0.2 mg Transdermal Weekly  . [START ON 08/26/2019] epoetin (EPOGEN/PROCRIT) injection  4,000 Units Intravenous Q T,Th,Sa-HD  . feeding supplement (JEVITY 1.5 CAL/FIBER)  1,000 mL Oral Q24H  . furosemide  80 mg Oral QODAY  . heparin  5,000 Units Subcutaneous Q12H  . insulin aspart  0-6 Units Subcutaneous Q4H  . insulin glargine  10 Units Subcutaneous QHS  . mouth rinse  15 mL  Mouth Rinse q12n4p   acetaminophen **OR** acetaminophen, hydrALAZINE, ipratropium-albuterol, labetalol, magnesium hydroxide, ondansetron **OR** ondansetron (ZOFRAN) IV  Assessment/ Plan:  Amber Dixon is a 69 y.o. Hispanic female with end stage renal disease on hemodialysis, diabetes mellitus, hypertension, who was admitted 08/16/2019 for Encephalopathy [G93.40] Sepsis ([place]) [A41.9] Fever, unspecified fever cause [R50.9] Altered mental status, unspecified altered mental status type [R41.82]  [facility] ([place]) [facility] TTS Right AVF  1. End stage renal disease: TTS schedule.  Patient has moved to [facility] and will be transitioning to [place] and will be followed by [facility] Next hemodialysis will be scheduled for Tuesday No fluid removal during HD is planned as oral intake is low  2. Hypertension: Home regimen of amlodipine and furosemide. PRN hydralazine BP Readings from Last 3 Encounters:  08/25/19 (!) 155/68  08/14/19 (!) 161/100  07/30/19 (!) 185/79    3. Anemia of chronic kidney disease:   - EPO with HD treatment.  Lab Results  Component Value Date   HGB 10.2 (L) 08/23/2019     4. Secondary Hyperparathyroidism: calcium and phosphorus at goal. Not currently on binders.  Lab Results  Component Value Date   PTH 269 (H) 03/22/2019   PTH Comment 03/22/2019   CALCIUM 7.7 (L) 08/23/2019   CAION 0.90 (L) 06/09/2019   PHOS 5.6 (H) 08/23/2019     5. Encephalopathy:  Evaluated by neurologist 4/30 Depressed mental status is felt to be secondary to metabolic encephalopathy MRI brain no acute changes EEG consistent with severe metabolic encephalopathy Improved today    LOS: 9 Nare Gaspari 5/3/202112:48 PM

## 2019-08-25 NOTE — Progress Notes (Signed)
Progress Note    Amber Dixon  HKV:[id number] DOB: May 10, 1950  DOA: 08/16/2019 PCP: Patient, No Pcp Per      Brief Narrative:    Medical records reviewed and are as summarized below:        Assessment/Plan:   Principal Problem:   Encephalopathy Active Problems:   ESRD (end stage renal disease) on dialysis ([place])   Altered mental status   DNR (do not resuscitate) discussion   Palliative care by specialist   Protein-calorie malnutrition, severe   SIRS: No clear source of infection so far.  Antibiotics have been discontinued  Acute toxic metabolic encephalopathy: Improved.  Patient was able to communicate with me using the [place] interpreter service via iPad.  She was alert and oriented to person.  However she said that her memory is not good.  Work-up including CT head, MRI brain, TFT, ammonia and cultures unrevealing.           Hypertension/ s/p hypertensive emergency: Continue amlodipine and clonidine patch.  Type 2 diabetes mellitus with hyperglycemia: This is likely from enteral nutrition.  Continue low-dose Lantus.  Continue NovoLog as needed.  ESRD/bone marrow disorder: On HD TTS. Follow-up with nephrologist  Anemia of chronic disease: H&H stable. Monitor H&H  Underweight/severe protein calorie malnutrition:Body mass index is 14.96 kg/m.   NG tube was placed on 08/20/2019.  She was evaluated by speech therapist today and pured diet and thin liquids were recommended.  Remove NG tube and discontinue enteral nutrition.    Body mass index is 13.76 kg/m.  (Underweight)        Family Communication/Anticipated D/C date and plan/Code Status   DVT prophylaxis: Heparin Code Status: Full code Family Communication: None. Disposition Plan:    Status is: Inpatient  Remains inpatient appropriate because:Inpatient level of care appropriate due to severity of illness   Dispo: The patient is from: Home              Anticipated d/c is  to: SNF              Anticipated d/c date is: 1 day              Patient currently is not medically stable to d/c.            Subjective:   C/o hunger and wants to eat  Objective:    Vitals:   08/20/19 1600 08/20/19 1900 08/20/19 2000 08/20/19 2100  BP: 131/62 (!) 145/65 (!) 160/62 (!) 151/65  Pulse: 97 99 98 93  Resp: 17 20 18 18   Temp:  98.4 F (36.9 C)    TempSrc:  Oral    SpO2: 97% 98% 97% 100%  Weight:      Height:       No data found.  No intake or output data in the 24 hours ending 08/25/19 0856 Filed Weights   08/22/19 0453 08/23/19 0429 08/25/19 0500  Weight: 29.6 kg 28.7 kg 30.9 kg    Exam:  GEN: NAD SKIN: Warm and dry EYES: No pallor or icterus ENT: MMM, nasogastric tube in place for enteral nutrition CV: RRR PULM: CTA B ABD: soft, ND, NT, +BS CNS: Awake and alert. Oriented to person only EXT: No edema or tenderness.  ?Chronic right knee deformity.   Data Reviewed:   I have personally reviewed following labs and imaging studies:  Labs: Labs show the following:   Basic Metabolic Panel: Recent Labs  Lab 08/19/19 0406 08/19/19 0406 08/20/19  1415 08/21/19 0447 08/21/19 1400 08/21/19 1400 08/22/19 0423 08/23/19 0417  NA 142  --   --   --  145  --  139 141  K 4.3   < >  --   --  3.5   < > 4.5 4.5  CL 104  --   --   --  100  --  97* 97*  CO2 17*  --   --   --  27  --  32 32  GLUCOSE 131*  --   --   --  185*  --  267* 177*  BUN 96*  --   --   --  93*  --  36* 66*  CREATININE 6.78*  --   --   --  6.81*  --  3.23* 4.84*  CALCIUM 7.7*  --   --   --  7.8*  --  7.9* 7.7*  MG 2.7*  --  2.3 2.6*  --   --  2.0 2.4  PHOS 7.0*   < > 7.0* 7.2* 7.3*  --  4.3 5.6*   < > = values in this interval not displayed.   GFR Estimated Creatinine Clearance: 5.3 mL/min (A) (by C-G formula based on SCr of 4.84 mg/dL (H)). Liver Function Tests: Recent Labs  Lab 08/19/19 0406 08/21/19 1400  ALBUMIN 3.4* 3.0*   No results for input(s): LIPASE,  AMYLASE in the last 168 hours. Recent Labs  Lab 08/20/19 0444 08/21/19 0447 08/22/19 0423 08/23/19 0417 08/24/19 0403  AMMONIA 25 23 32 20 11   Coagulation profile No results for input(s): INR, PROTIME in the last 168 hours.  CBC: Recent Labs  Lab 08/19/19 0406 08/21/19 1400 08/23/19 0417  WBC 11.3* 8.2 9.6  HGB 9.6* 10.7* 10.2*  HCT 29.4* 32.7* 32.1*  MCV 82.8 82.2 83.8  PLT 198 219 204   Cardiac Enzymes: No results for input(s): CKTOTAL, CKMB, CKMBINDEX, TROPONINI in the last 168 hours. BNP (last 3 results) No results for input(s): PROBNP in the last 8760 hours. CBG: Recent Labs  Lab 08/24/19 1610 08/24/19 1919 08/24/19 2354 08/25/19 0359 08/25/19 0730  GLUCAP 169* 161* 206* 199* 133*   D-Dimer: No results for input(s): DDIMER in the last 72 hours. Hgb A1c: No results for input(s): HGBA1C in the last 72 hours. Lipid Profile: No results for input(s): CHOL, HDL, LDLCALC, TRIG, CHOLHDL, LDLDIRECT in the last 72 hours. Thyroid function studies: No results for input(s): TSH, T4TOTAL, T3FREE, THYROIDAB in the last 72 hours.  Invalid input(s): FREET3 Anemia work up: No results for input(s): VITAMINB12, FOLATE, FERRITIN, TIBC, IRON, RETICCTPCT in the last 72 hours. Sepsis Labs: Recent Labs  Lab 08/19/19 0406 08/21/19 1400 08/23/19 0417  WBC 11.3* 8.2 9.6    Microbiology Recent Results (from the past 240 hour(s))  Urine culture     Status: None   Collection Time: 08/16/19  8:23 PM   Specimen: In/Out Cath Urine  Result Value Ref Range Status   Specimen Description   Final    IN/OUT CATH URINE Performed at [facility], [address] [place], [place] [address]    Special Requests   Final    NONE Performed at [facility], [address], [place], [place] [address]    Culture   Final    NO GROWTH Performed at [facility], [place] [address], [place], [place] [address]    Report Status 08/18/2019 FINAL  Final  Blood Culture  (routine x 2)  Status: None   Collection Time: 08/16/19  8:29 PM   Specimen: BLOOD  Result Value Ref Range Status   Specimen Description BLOOD BLOOD LEFT FOREARM  Final   Special Requests   Final    BOTTLES DRAWN AEROBIC AND ANAEROBIC Blood Culture adequate volume   Culture   Final    NO GROWTH 5 DAYS Performed at [facility], [address] [place], [place] [address]    Report Status 08/21/2019 FINAL  Final  Blood Culture (routine x 2)     Status: None   Collection Time: 08/16/19  8:29 PM   Specimen: BLOOD  Result Value Ref Range Status   Specimen Description BLOOD BLOOD LEFT ARM  Final   Special Requests   Final    BOTTLES DRAWN AEROBIC AND ANAEROBIC Blood Culture results may not be optimal due to an inadequate volume of blood received in culture bottles   Culture   Final    NO GROWTH 5 DAYS Performed at [facility], [address] [place], [place] [address]    Report Status 08/21/2019 FINAL  Final  Respiratory Panel by RT PCR (Flu A&B, Covid) - Urine, Clean Catch     Status: None   Collection Time: 08/16/19  8:47 PM   Specimen: Urine, Clean Catch  Result Value Ref Range Status   SARS Coronavirus 2 by RT PCR NEGATIVE NEGATIVE Final    Comment: (NOTE) SARS-CoV-2 target nucleic acids are NOT DETECTED. The SARS-CoV-2 RNA is generally detectable in upper respiratoy specimens during the acute phase of infection. The lowest concentration of SARS-CoV-2 viral copies this assay can detect is 131 copies/mL. A negative result does not preclude SARS-Cov-2 infection and should not be used as the sole basis for treatment or other patient management decisions. A negative result may occur with  improper specimen collection/handling, submission of specimen other than nasopharyngeal swab, presence of viral mutation(s) within the areas targeted by this assay, and inadequate number of viral copies (<131 copies/mL). A negative result must be combined with  clinical observations, patient history, and epidemiological information. The expected result is Negative. Fact Sheet for Patients:  PinkCheek.be Fact Sheet for Healthcare Providers:  GravelBags.it This test is not yet ap proved or cleared by the [place] FDA and  has been authorized for detection and/or diagnosis of SARS-CoV-2 by FDA under an Emergency Use Authorization (EUA). This EUA will remain  in effect (meaning this test can be used) for the duration of the COVID-19 declaration under Section 564(b)(1) of the Act, 21 U.S.C. section 360bbb-3(b)(1), unless the authorization is terminated or revoked sooner.    Influenza A by PCR NEGATIVE NEGATIVE Final   Influenza B by PCR NEGATIVE NEGATIVE Final    Comment: (NOTE) The Xpert Xpress SARS-CoV-2/FLU/RSV assay is intended as an aid in  the diagnosis of influenza from Nasopharyngeal swab specimens and  should not be used as a sole basis for treatment. Nasal washings and  aspirates are unacceptable for Xpert Xpress SARS-CoV-2/FLU/RSV  testing. Fact Sheet for Patients: PinkCheek.be Fact Sheet for Healthcare Providers: GravelBags.it This test is not yet approved or cleared by the [place] FDA and  has been authorized for detection and/or diagnosis of SARS-CoV-2 by  FDA under an Emergency Use Authorization (EUA). This EUA will remain  in effect (meaning this test can be used) for the duration of the  Covid-19 declaration under Section 564(b)(1) of the Act, 21  U.S.C. section 360bbb-3(b)(1), unless the authorization is  terminated or revoked. Performed  at [facility], [address], [place],  [address]     Procedures and diagnostic studies:  No results found.  Medications:   . amLODipine  5 mg Oral Daily  . B-complex with vitamin C  1 tablet Per Tube QHS  . chlorhexidine  15 mL Mouth  Rinse BID  . Chlorhexidine Gluconate Cloth  6 each Topical Daily  . cloNIDine  0.2 mg Transdermal Weekly  . [START ON 08/26/2019] epoetin (EPOGEN/PROCRIT) injection  4,000 Units Intravenous Q T,Th,Sa-HD  . feeding supplement (JEVITY 1.5 CAL/FIBER)  1,000 mL Oral Q24H  . furosemide  80 mg Oral QODAY  . heparin  5,000 Units Subcutaneous Q12H  . insulin aspart  0-6 Units Subcutaneous Q4H  . insulin glargine  10 Units Subcutaneous QHS  . mouth rinse  15 mL Mouth Rinse q12n4p   Continuous Infusions:    LOS: 9 days   Madisynn Plair  [facility]     08/25/2019, 8:56 AM

## 2019-08-26 ENCOUNTER — Encounter: Payer: Self-pay | Admitting: Family Medicine

## 2019-08-26 ENCOUNTER — Other Ambulatory Visit: Payer: Self-pay

## 2019-08-26 LAB — GLUCOSE, CAPILLARY
Glucose-Capillary: 235 mg/dL — ABNORMAL HIGH (ref 70–99)
Glucose-Capillary: 36 mg/dL — CL (ref 70–99)
Glucose-Capillary: 162 mg/dL — ABNORMAL HIGH (ref 70–99)
Glucose-Capillary: 109 mg/dL — ABNORMAL HIGH (ref 70–99)
Glucose-Capillary: 75 mg/dL (ref 70–99)
Glucose-Capillary: 223 mg/dL — ABNORMAL HIGH (ref 70–99)
Glucose-Capillary: 225 mg/dL — ABNORMAL HIGH (ref 70–99)

## 2019-08-26 LAB — RENAL FUNCTION PANEL
Albumin: 2.4 g/dL — ABNORMAL LOW (ref 3.5–5.0)
Anion gap: 9 (ref 5–15)
BUN: 46 mg/dL — ABNORMAL HIGH (ref 8–23)
CO2: 31 mmol/L (ref 22–32)
Calcium: 7.5 mg/dL — ABNORMAL LOW (ref 8.9–10.3)
Chloride: 97 mmol/L — ABNORMAL LOW (ref 98–111)
Creatinine, Ser: 3.51 mg/dL — ABNORMAL HIGH (ref 0.44–1.00)
GFR calc Af Amer: 14 mL/min — ABNORMAL LOW (ref >60)
GFR calc non Af Amer: 12 mL/min — ABNORMAL LOW (ref >60)
Glucose, Bld: 169 mg/dL — ABNORMAL HIGH (ref 70–99)
Phosphorus: 3.7 mg/dL (ref 2.5–4.6)
Potassium: 3.9 mmol/L (ref 3.5–5.1)
Sodium: 137 mmol/L (ref 135–145)

## 2019-08-26 LAB — MRSA PCR SCREENING: MRSA by PCR: NEGATIVE

## 2019-08-26 LAB — GLUCOSE, RANDOM: Glucose, Bld: 149 mg/dL — ABNORMAL HIGH (ref 70–99)

## 2019-08-26 MED ORDER — LOPERAMIDE HCL 2 MG PO CAPS
2.0000 mg | ORAL_CAPSULE | Freq: Four times a day (QID) | ORAL | Status: DC | PRN
  Administered 2019-08-26 – 2019-08-28 (×3): 2 mg via ORAL
  Filled 2019-08-26 (×2): qty 1

## 2019-08-26 MED ORDER — RENA-VITE PO TABS
1.0000 | ORAL_TABLET | Freq: Every day | ORAL | Status: DC
  Administered 2019-08-26 – 2019-09-04 (×9): 1 via ORAL
  Filled 2019-08-26 (×9): qty 1

## 2019-08-26 MED ORDER — DEXTROSE 50 % IV SOLN
INTRAVENOUS | Status: AC
  Administered 2019-08-26: 50 mL
  Filled 2019-08-26: qty 50

## 2019-08-26 MED ORDER — NEPRO/CARBSTEADY PO LIQD
237.0000 mL | Freq: Two times a day (BID) | ORAL | Status: DC
  Administered 2019-08-26 – 2019-09-05 (×17): 237 mL via ORAL

## 2019-08-26 NOTE — Plan of Care (Addendum)
The patient is Amber Dixon today. No falls. Bed alarm on. The patient has had multiple episodes of runny diarrhea. Imodium provided to help slow down liquid stools. The patient had dialysis today.   Problem: Education: Goal: Knowledge of General Education information will improve Description: Including pain rating scale, medication(s)/side effects and non-pharmacologic comfort measures Outcome: Not Progressing   Problem: Health Behavior/Discharge Planning: Goal: Ability to manage health-related needs will improve Outcome: Not Progressing   Problem: Clinical Measurements: Goal: Ability to maintain clinical measurements within normal limits will improve Outcome: Not Progressing Goal: Will remain free from infection Outcome: Not Progressing Goal: Diagnostic test results will improve Outcome: Not Progressing Goal: Respiratory complications will improve Outcome: Not Progressing Goal: Cardiovascular complication will be avoided Outcome: Not Progressing   Problem: Activity: Goal: Risk for activity intolerance will decrease Outcome: Not Progressing   Problem: Nutrition: Goal: Adequate nutrition will be maintained Outcome: Not Progressing   Problem: Coping: Goal: Level of anxiety will decrease Outcome: Not Progressing   Problem: Elimination: Goal: Will not experience complications related to bowel motility Outcome: Not Progressing Goal: Will not experience complications related to urinary retention Outcome: Not Progressing   Problem: Pain Managment: Goal: General experience of comfort will improve Outcome: Not Progressing   Problem: Safety: Goal: Ability to remain free from injury will improve Outcome: Not Progressing   Problem: Skin Integrity: Goal: Risk for impaired skin integrity will decrease Outcome: Not Progressing

## 2019-08-26 NOTE — Progress Notes (Signed)
[facility]  ROUNDING NOTE   Subjective:     HEMODIALYSIS FLOWSHEET:  Blood Flow Rate (mL/min): 300 mL/min Arterial Pressure (mmHg): -100 mmHg Venous Pressure (mmHg): 100 mmHg Transmembrane Pressure (mmHg): 40 mmHg Ultrafiltration Rate (mL/min): 160 mL/min Dialysate Flow Rate (mL/min): 600 ml/min Conductivity: Machine : 14 Conductivity: Machine : 14 Dialysis Fluid Bolus: Normal Saline Bolus Amount (mL): 250 mL    Objective:  Vital signs in last 24 hours:  Temp:  [97.5 F (36.4 C)-98.4 F (36.9 C)] 98 F (36.7 C) (05/04 1030) Pulse Rate:  [76-93] 85 (05/04 1330) Resp:  [0-25] 16 (05/04 1330) BP: (118-168)/(49-88) 164/64 (05/04 1330) SpO2:  [97 %-100 %] 100 % (05/04 1330) Weight:  [31.9 kg] 31.9 kg (05/04 0422)  Weight change: 1.033 kg Filed Weights   08/23/19 0429 08/25/19 0500 08/26/19 0422  Weight: 28.7 kg 30.9 kg 31.9 kg    Intake/Output: No intake/output data recorded.   Intake/Output this shift:  Total I/O In: 200 [P.O.:200] Out: -   Physical Exam: General: NAD, laying in bed  Head: Normocephalic, atraumatic. Moist oral mucosal membranes  Eyes: Anicteric,   Lungs:  Clear to auscultation  Heart: Regular rate and rhythm  Abdomen:  Soft, nontender  Extremities:  no peripheral edema.  Neurologic:  Resting quietly  Skin: No lesions  Access: Right IJ PermCath    Basic Metabolic Panel: Recent Labs  Lab 08/20/19 1415 08/20/19 1415 08/21/19 0447 08/21/19 1400 08/21/19 1400 08/22/19 0423 08/23/19 0417 08/26/19 0618 08/26/19 0935  NA  --   --   --  145  --  139 141  --  137  K  --   --   --  3.5  --  4.5 4.5  --  3.9  CL  --   --   --  100  --  97* 97*  --  97*  CO2  --   --   --  27  --  32 32  --  31  GLUCOSE  --   --   --  185*  --  267* 177* 149* 169*  BUN  --   --   --  93*  --  36* 66*  --  46*  CREATININE  --   --   --  6.81*  --  3.23* 4.84*  --  3.51*  CALCIUM  --   --   --  7.8*   < > 7.9* 7.7*  --  7.5*  MG 2.3  --   2.6*  --   --  2.0 2.4  --   --   PHOS 7.0*   < > 7.2* 7.3*  --  4.3 5.6*  --  3.7   < > = values in this interval not displayed.    Liver Function Tests: Recent Labs  Lab 08/21/19 1400 08/26/19 0935  ALBUMIN 3.0* 2.4*   No results for input(s): LIPASE, AMYLASE in the last 168 hours. Recent Labs  Lab 08/22/19 0423 08/23/19 0417 08/24/19 0403  AMMONIA 32 20 11    CBC: Recent Labs  Lab 08/21/19 1400 08/23/19 0417  WBC 8.2 9.6  HGB 10.7* 10.2*  HCT 32.7* 32.1*  MCV 82.2 83.8  PLT 219 204    Cardiac Enzymes: No results for input(s): CKTOTAL, CKMB, CKMBINDEX, TROPONINI in the last 168 hours.  BNP: Invalid input(s): POCBNP  CBG: Recent Labs  Lab 08/26/19 0405 08/26/19 0413 08/26/19 0429 08/26/19 0511 08/26/19 0737  GLUCAP 39* 36* 235* 162* 109*  Microbiology: Results for orders placed or performed during the hospital encounter of 08/16/19  Urine culture     Status: None   Collection Time: 08/16/19  8:23 PM   Specimen: In/Out Cath Urine  Result Value Ref Range Status   Specimen Description   Final    IN/OUT CATH URINE Performed at [facility], [address] [place], [place] [address]    Special Requests   Final    NONE Performed at [facility], [address], [place], [place] [address]    Culture   Final    NO GROWTH Performed at [facility], [place] [address] [place], [place] [address]    Report Status 08/18/2019 FINAL  Final  Blood Culture (routine x 2)     Status: None   Collection Time: 08/16/19  8:29 PM   Specimen: BLOOD  Result Value Ref Range Status   Specimen Description BLOOD BLOOD LEFT FOREARM  Final   Special Requests   Final    BOTTLES DRAWN AEROBIC AND ANAEROBIC Blood Culture adequate volume   Culture   Final    NO GROWTH 5 DAYS Performed at [facility], [address] [place], [place] [address]    Report Status 08/21/2019 FINAL  Final  Blood Culture (routine x 2)     Status: None    Collection Time: 08/16/19  8:29 PM   Specimen: BLOOD  Result Value Ref Range Status   Specimen Description BLOOD BLOOD LEFT ARM  Final   Special Requests   Final    BOTTLES DRAWN AEROBIC AND ANAEROBIC Blood Culture results may not be optimal due to an inadequate volume of blood received in culture bottles   Culture   Final    NO GROWTH 5 DAYS Performed at [facility], [address] [place], [place] [address]    Report Status 08/21/2019 FINAL  Final  Respiratory Panel by RT PCR (Flu A&B, Covid) - Urine, Clean Catch     Status: None   Collection Time: 08/16/19  8:47 PM   Specimen: Urine, Clean Catch  Result Value Ref Range Status   SARS Coronavirus 2 by RT PCR NEGATIVE NEGATIVE Final    Comment: (NOTE) SARS-CoV-2 target nucleic acids are NOT DETECTED. The SARS-CoV-2 RNA is generally detectable in upper respiratoy specimens during the acute phase of infection. The lowest concentration of SARS-CoV-2 viral copies this assay can detect is 131 copies/mL. A negative result does not preclude SARS-Cov-2 infection and should not be used as the sole basis for treatment or other patient management decisions. A negative result may occur with  improper specimen collection/handling, submission of specimen other than nasopharyngeal swab, presence of viral mutation(s) within the areas targeted by this assay, and inadequate number of viral copies (<131 copies/mL). A negative result must be combined with clinical observations, patient history, and epidemiological information. The expected result is Negative. Fact Sheet for Patients:  PinkCheek.be Fact Sheet for Healthcare Providers:  GravelBags.it This test is not yet ap proved or cleared by the [place] FDA and  has been authorized for detection and/or diagnosis of SARS-CoV-2 by FDA under an Emergency Use Authorization (EUA). This EUA will remain  in effect (meaning this  test can be used) for the duration of the COVID-19 declaration under Section 564(b)(1) of the Act, 21 U.S.C. section 360bbb-3(b)(1), unless the authorization is terminated or revoked sooner.    Influenza A by PCR NEGATIVE NEGATIVE Final   Influenza B by PCR NEGATIVE NEGATIVE  Final    Comment: (NOTE) The Xpert Xpress SARS-CoV-2/FLU/RSV assay is intended as an aid in  the diagnosis of influenza from Nasopharyngeal swab specimens and  should not be used as a sole basis for treatment. Nasal washings and  aspirates are unacceptable for Xpert Xpress SARS-CoV-2/FLU/RSV  testing. Fact Sheet for Patients: PinkCheek.be Fact Sheet for Healthcare Providers: GravelBags.it This test is not yet approved or cleared by the [place] FDA and  has been authorized for detection and/or diagnosis of SARS-CoV-2 by  FDA under an Emergency Use Authorization (EUA). This EUA will remain  in effect (meaning this test can be used) for the duration of the  Covid-19 declaration under Section 564(b)(1) of the Act, 21  U.S.C. section 360bbb-3(b)(1), unless the authorization is  terminated or revoked. Performed at [facility], [address] [place], [place] [address]   MRSA PCR Screening     Status: None   Collection Time: 08/26/19  4:00 AM   Specimen: Nasal Mucosa; Nasopharyngeal  Result Value Ref Range Status   MRSA by PCR NEGATIVE NEGATIVE Final    Comment:        The GeneXpert MRSA Assay (FDA approved for NASAL specimens only), is one component of a comprehensive MRSA colonization surveillance program. It is not intended to diagnose MRSA infection nor to guide or monitor treatment for MRSA infections. Performed at [facility], [address] [place], [place] [address]     Coagulation Studies: No results for input(s): LABPROT, INR in the last 72 hours.  Urinalysis: No results for input(s): COLORURINE, LABSPEC,  PHURINE, GLUCOSEU, HGBUR, BILIRUBINUR, KETONESUR, PROTEINUR, UROBILINOGEN, NITRITE, LEUKOCYTESUR in the last 72 hours.  Invalid input(s): APPERANCEUR    Imaging: No results found.   Medications:    . amLODipine  5 mg Oral Daily  . chlorhexidine  15 mL Mouth Rinse BID  . Chlorhexidine Gluconate Cloth  6 each Topical Daily  . cloNIDine  0.2 mg Transdermal Weekly  . epoetin (EPOGEN/PROCRIT) injection  4,000 Units Intravenous Q T,Th,Sa-HD  . feeding supplement (NEPRO CARB STEADY)  237 mL Oral BID BM  . furosemide  80 mg Oral QODAY  . heparin  5,000 Units Subcutaneous Q12H  . insulin aspart  0-6 Units Subcutaneous Q4H  . mouth rinse  15 mL Mouth Rinse q12n4p  . multivitamin  1 tablet Oral QHS   acetaminophen **OR** acetaminophen, hydrALAZINE, ipratropium-albuterol, labetalol, loperamide, magnesium hydroxide, ondansetron **OR** ondansetron (ZOFRAN) IV  Assessment/ Plan:  Amber Dixon is a 69 y.o. Hispanic female with end stage renal disease on hemodialysis, diabetes mellitus, hypertension, who was admitted 08/16/2019 for Encephalopathy [G93.40] Sepsis ([place]) [A41.9] Fever, unspecified fever cause [R50.9] Altered mental status, unspecified altered mental status type [R41.82]  [facility] ([place]) [facility] Right AVF  1. End stage renal disease: TTS schedule.  Patient has moved to [facility] and will be transitioning to [place] and will be followed by [facility] Next hemodialysis will be scheduled for Tuesday No fluid removal during HD is planned as oral intake is low  2. Hypertension: Home regimen of amlodipine and furosemide. PRN hydralazine BP Readings from Last 3 Encounters:  08/26/19 (!) 164/64  08/14/19 (!) 161/100  07/30/19 (!) 185/79    3. Anemia of chronic kidney disease:   - EPO with HD treatment for hgb <11.  Lab Results  Component Value Date   HGB 10.2 (L) 08/23/2019     4. Secondary  Hyperparathyroidism: calcium and phosphorus at  goal. Not currently on binders.  Lab Results  Component Value Date   PTH 269 (H) 03/22/2019   PTH Comment 03/22/2019   CALCIUM 7.5 (L) 08/26/2019   CAION 0.90 (L) 06/09/2019   PHOS 3.7 08/26/2019     5. Encephalopathy:  Evaluated by neurologist 4/30 Depressed mental status is felt to be secondary to metabolic encephalopathy MRI brain no acute changes EEG consistent with severe metabolic encephalopathy Improved today    LOS: 10 Amber Dixon 5/4/20211:39 PM

## 2019-08-26 NOTE — Progress Notes (Signed)
OT Cancellation Note  Patient Details Name: Amber Dixon MRN: [id number] DOB: 1951-02-03   Cancelled Treatment:    Reason Eval/Treat Not Completed: Patient at procedure or test/ unavailable. Thank you for the OT consult. Order received and chart reviewed. Per pt physical therapist who spoke with RN, pt currently in dialysis. Will hold at this time and initiates services as available and pt medically appropriate for OT evaluation.   Shara Blazing, M.S., OTR/L Ascom: [phone] 08/26/19, 10:49 AM

## 2019-08-26 NOTE — Progress Notes (Signed)
Pt stable for HD tx no c/os CVC wdl AVF +/+UFG 82ml

## 2019-08-26 NOTE — Progress Notes (Signed)
The patient has been transfer from ICU only Alert and oriented to self.

## 2019-08-26 NOTE — Progress Notes (Addendum)
Progress Note    Amber Dixon  MOQ:[id number] DOB: 12/04/1950  DOA: 08/16/2019 PCP: Patient, No Pcp Per      Brief Narrative:    Medical records reviewed and are as summarized below:  Amber Dixon  is a 69 y.o. Hispanic female with a known history of type 2 diabetes mellitus, hypertension and chronic kidney disease, who presented to the emergency room with headache, dizziness, significantly elevated blood pressure and acute onset of altered mental status with confusion and decreased responsiveness on 08/16/2019.  The patient was seen in [facility] on 4/22 for elevated blood pressure and similar confusion after hemodialysis which has resolved with improved blood pressure.  On admission, her blood pressure was 216/111, she was febrile with temperature 102.2 and tachycardic with heart rate in the 120s.  She was admitted to the hospital for acute encephalopathy and probable sepsis.  She was found lethargic and minimally responsive.  She was treated with empiric IV antibiotics.  She was seen in consultation by the nephrologist and she underwent hemodialysis.  Her blood pressure has slowly improved.  She was also seen by the neurologist because of acute encephalopathy.  CT head and MRI brain did not show any acute abnormality.  Blood cultures were negative and there was no evidence of infection so antibiotics was discontinued.  Sepsis was ruled out.  Encephalopathy was thought to be due to metabolic encephalopathy versus hypertensive encephalopathy.  Encephalopathy slowly improved.  She required enteral nutrition via nasogastric tube.  Mental status is back to baseline and she has been able to tolerate a regular diet.  She developed nausea, abdominal cramps and diarrhea.   Assessment/Plan:   Principal Problem:   Encephalopathy Active Problems:   ESRD (end stage renal disease) on dialysis (HCC)   Altered mental status   DNR (do not resuscitate)  discussion   Palliative care by specialist   Protein-calorie malnutrition, severe   SIRS: No clear source of infection so far.  Antibiotics have been discontinued  Nausea, watery diarrhea and abdominal cramps: This may be related to recent enteral nutrition.  Imodium as needed.  Doubt C. difficile infection at this time but will continue to monitor.  Encouraged adequate oral hydration.   Acute toxic metabolic encephalopathy: Improved.  It appears patient is at her baseline.  Work-up including CT head, MRI brain, TFT, ammonia and cultures unrevealing.           Hypertension/ s/p hypertensive emergency: Continue amlodipine and clonidine patch.  Type 2 diabetes mellitus with hypoglycemia today: Discontinue Lantus since enteral nutrition has been discontinued.  Continue NovoLog as needed.  Continue to monitor glucose levels closely.  ESRD/bone marrow disorder: On HD TTS. Follow-up with nephrologist  Anemia of chronic disease: H&H stable. Monitor H&H  Underweight/severe protein calorie malnutrition:Body mass index is 14.96 kg/m.   NG tube was placed on 08/20/2019.    Body mass index is 14.22 kg/m.  (Underweight)  Plan discussed with Cory Roughen, RN at the bedside.      Family Communication/Anticipated D/C date and plan/Code Status   DVT prophylaxis: Heparin Code Status: Full code Family Communication: None. Disposition Plan:    Status is: Inpatient  Remains inpatient appropriate because:Inpatient level of care appropriate due to severity of illness   Dispo: The patient is from: Home              Anticipated d/c is to: SNF  Anticipated d/c date is: 1 day              Patient currently is not medically stable to d/c.  Patient is having watery diarrhea and abdominal cramps.  Monitor for resolution since patient is at risk for decompensation given her age and frailty.            Subjective:   C/o nausea, watery diarrhea and abdominal cramps.  According  to her nurse, she said about 5 watery stools this morning.  Objective:    Vitals:   08/20/19 1600 08/20/19 1900 08/20/19 2000 08/20/19 2100  BP: 131/62 (!) 145/65 (!) 160/62 (!) 151/65  Pulse: 97 99 98 93  Resp: 17 20 18 18   Temp:  98.4 F (36.9 C)    TempSrc:  Oral    SpO2: 97% 98% 97% 100%  Weight:      Height:       No data found.   Intake/Output Summary (Last 24 hours) at 08/26/2019 1412 Last data filed at 08/26/2019 1345 Gross per 24 hour  Intake 200 ml  Output 4 ml  Net 196 ml   Filed Weights   08/23/19 0429 08/25/19 0500 08/26/19 0422  Weight: 28.7 kg 30.9 kg 31.9 kg    Exam:  GEN: NAD, frail SKIN: Warm and dry EYES: No pallor or icterus ENT: MMM CV: RRR PULM: No wheezing or rales heard ABD: soft, ND, NT, +BS CNS: Awake and alert. Oriented to person only EXT: No edema or tenderness.  ?Chronic right knee deformity.   Data Reviewed:   I have personally reviewed following labs and imaging studies:  Labs: Labs show the following:   Basic Metabolic Panel: Recent Labs  Lab 08/20/19 1415 08/20/19 1415 08/21/19 0447 08/21/19 1400 08/21/19 1400 08/22/19 0423 08/22/19 0423 08/23/19 0417 08/26/19 0618 08/26/19 0935  NA  --   --   --  145  --  139  --  141  --  137  K  --   --   --  3.5   < > 4.5   < > 4.5  --  3.9  CL  --   --   --  100  --  97*  --  97*  --  97*  CO2  --   --   --  27  --  32  --  32  --  31  GLUCOSE  --   --   --  185*  --  267*  --  177* 149* 169*  BUN  --   --   --  93*  --  36*  --  66*  --  46*  CREATININE  --   --   --  6.81*  --  3.23*  --  4.84*  --  3.51*  CALCIUM  --   --   --  7.8*  --  7.9*  --  7.7*  --  7.5*  MG 2.3  --  2.6*  --   --  2.0  --  2.4  --   --   PHOS 7.0*   < > 7.2* 7.3*  --  4.3  --  5.6*  --  3.7   < > = values in this interval not displayed.   GFR Estimated Creatinine Clearance: 7.5 mL/min (A) (by C-G formula based on SCr of 3.51 mg/dL (H)). Liver Function Tests: Recent Labs  Lab 08/21/19 1400  08/26/19 0935  ALBUMIN 3.0* 2.4*   No  results for input(s): LIPASE, AMYLASE in the last 168 hours. Recent Labs  Lab 08/20/19 0444 08/21/19 0447 08/22/19 0423 08/23/19 0417 08/24/19 0403  AMMONIA 25 23 32 20 11   Coagulation profile No results for input(s): INR, PROTIME in the last 168 hours.  CBC: Recent Labs  Lab 08/21/19 1400 08/23/19 0417  WBC 8.2 9.6  HGB 10.7* 10.2*  HCT 32.7* 32.1*  MCV 82.2 83.8  PLT 219 204   Cardiac Enzymes: No results for input(s): CKTOTAL, CKMB, CKMBINDEX, TROPONINI in the last 168 hours. BNP (last 3 results) No results for input(s): PROBNP in the last 8760 hours. CBG: Recent Labs  Lab 08/26/19 0405 08/26/19 0413 08/26/19 0429 08/26/19 0511 08/26/19 0737  GLUCAP 39* 36* 235* 162* 109*   D-Dimer: No results for input(s): DDIMER in the last 72 hours. Hgb A1c: No results for input(s): HGBA1C in the last 72 hours. Lipid Profile: No results for input(s): CHOL, HDL, LDLCALC, TRIG, CHOLHDL, LDLDIRECT in the last 72 hours. Thyroid function studies: No results for input(s): TSH, T4TOTAL, T3FREE, THYROIDAB in the last 72 hours.  Invalid input(s): FREET3 Anemia work up: No results for input(s): VITAMINB12, FOLATE, FERRITIN, TIBC, IRON, RETICCTPCT in the last 72 hours. Sepsis Labs: Recent Labs  Lab 08/21/19 1400 08/23/19 0417  WBC 8.2 9.6    Microbiology Recent Results (from the past 240 hour(s))  Urine culture     Status: None   Collection Time: 08/16/19  8:23 PM   Specimen: In/Out Cath Urine  Result Value Ref Range Status   Specimen Description   Final    IN/OUT CATH URINE Performed at [facility], [address], [place], [place] [address]    Special Requests   Final    NONE Performed at [facility], [address], [place], [place] [address]    Culture   Final    NO GROWTH Performed at [facility], [place] [address], [place], [place] [address]    Report Status 08/18/2019 FINAL  Final  Blood  Culture (routine x 2)     Status: None   Collection Time: 08/16/19  8:29 PM   Specimen: BLOOD  Result Value Ref Range Status   Specimen Description BLOOD BLOOD LEFT FOREARM  Final   Special Requests   Final    BOTTLES DRAWN AEROBIC AND ANAEROBIC Blood Culture adequate volume   Culture   Final    NO GROWTH 5 DAYS Performed at [facility], [address] [place], [place] [address]    Report Status 08/21/2019 FINAL  Final  Blood Culture (routine x 2)     Status: None   Collection Time: 08/16/19  8:29 PM   Specimen: BLOOD  Result Value Ref Range Status   Specimen Description BLOOD BLOOD LEFT ARM  Final   Special Requests   Final    BOTTLES DRAWN AEROBIC AND ANAEROBIC Blood Culture results may not be optimal due to an inadequate volume of blood received in culture bottles   Culture   Final    NO GROWTH 5 DAYS Performed at [facility], [address], [place],  [address]    Report Status 08/21/2019 FINAL  Final  Respiratory Panel by RT PCR (Flu A&B, Covid) - Urine, Clean Catch     Status: None   Collection Time: 08/16/19  8:47 PM   Specimen: Urine, Clean Catch  Result Value Ref Range Status   SARS Coronavirus 2 by RT PCR NEGATIVE NEGATIVE Final  Comment: (NOTE) SARS-CoV-2 target nucleic acids are NOT DETECTED. The SARS-CoV-2 RNA is generally detectable in upper respiratoy specimens during the acute phase of infection. The lowest concentration of SARS-CoV-2 viral copies this assay can detect is 131 copies/mL. A negative result does not preclude SARS-Cov-2 infection and should not be used as the sole basis for treatment or other patient management decisions. A negative result may occur with  improper specimen collection/handling, submission of specimen other than nasopharyngeal swab, presence of viral mutation(s) within the areas targeted by this assay, and inadequate number of viral copies (<131 copies/mL). A negative result must be combined with  clinical observations, patient history, and epidemiological information. The expected result is Negative. Fact Sheet for Patients:  PinkCheek.be Fact Sheet for Healthcare Providers:  GravelBags.it This test is not yet ap proved or cleared by the [place] FDA and  has been authorized for detection and/or diagnosis of SARS-CoV-2 by FDA under an Emergency Use Authorization (EUA). This EUA will remain  in effect (meaning this test can be used) for the duration of the COVID-19 declaration under Section 564(b)(1) of the Act, 21 U.S.C. section 360bbb-3(b)(1), unless the authorization is terminated or revoked sooner.    Influenza A by PCR NEGATIVE NEGATIVE Final   Influenza B by PCR NEGATIVE NEGATIVE Final    Comment: (NOTE) The Xpert Xpress SARS-CoV-2/FLU/RSV assay is intended as an aid in  the diagnosis of influenza from Nasopharyngeal swab specimens and  should not be used as a sole basis for treatment. Nasal washings and  aspirates are unacceptable for Xpert Xpress SARS-CoV-2/FLU/RSV  testing. Fact Sheet for Patients: PinkCheek.be Fact Sheet for Healthcare Providers: GravelBags.it This test is not yet approved or cleared by the [place] FDA and  has been authorized for detection and/or diagnosis of SARS-CoV-2 by  FDA under an Emergency Use Authorization (EUA). This EUA will remain  in effect (meaning this test can be used) for the duration of the  Covid-19 declaration under Section 564(b)(1) of the Act, 21  U.S.C. section 360bbb-3(b)(1), unless the authorization is  terminated or revoked. Performed at [facility], [address] [place], [place] [address]   MRSA PCR Screening     Status: None   Collection Time: 08/26/19  4:00 AM   Specimen: Nasal Mucosa; Nasopharyngeal  Result Value Ref Range Status   MRSA by PCR NEGATIVE NEGATIVE Final     Comment:        The GeneXpert MRSA Assay (FDA approved for NASAL specimens only), is one component of a comprehensive MRSA colonization surveillance program. It is not intended to diagnose MRSA infection nor to guide or monitor treatment for MRSA infections. Performed at [facility], [address] [place], [place] [address]     Procedures and diagnostic studies:  No results found.  Medications:   . amLODipine  5 mg Oral Daily  . chlorhexidine  15 mL Mouth Rinse BID  . Chlorhexidine Gluconate Cloth  6 each Topical Daily  . cloNIDine  0.2 mg Transdermal Weekly  . epoetin (EPOGEN/PROCRIT) injection  4,000 Units Intravenous Q T,Th,Sa-HD  . feeding supplement (NEPRO CARB STEADY)  237 mL Oral BID BM  . furosemide  80 mg Oral QODAY  . heparin  5,000 Units Subcutaneous Q12H  . insulin aspart  0-6 Units Subcutaneous Q4H  . mouth rinse  15 mL Mouth Rinse q12n4p  . multivitamin  1 tablet Oral QHS   Continuous Infusions:    LOS: 10 days   Yuriel Lopezmartinez  [facility]     08/26/2019, 2:12 PM

## 2019-08-26 NOTE — Progress Notes (Signed)
Nutrition Follow-up  DOCUMENTATION CODES:   Severe malnutrition in context of chronic illness, Underweight  INTERVENTION:   Nepro Shake po BID, each supplement provides 425 kcal and 19 grams protein  Rena-vite daily   Bedtime snack  NUTRITION DIAGNOSIS:   Severe Malnutrition related to chronic illness(ESRD on HD) as evidenced by severe fat depletion, severe muscle depletion  GOAL:   Patient will meet greater than or equal to 90% of their needs -previously met with tube feeds.  MONITOR:   PO intake, Supplement acceptance, Labs, Weight trends, Skin, I & O's  ASSESSMENT:   69 year old female with PMHx of HTN, DM, ESRD on HD admitted with acute metabolic encephalopathy, hypertensive emergency.  Pt more alert; able to communicate via interpreter. Pt seen by SLP and placed on a dysphagia 1/thin liquid diet. NGT removed yesterday; pt hypoglycemic this morning. RD will add supplements and snacks to help pt meet her estimated needs. Per chart, pt is weight stable since admit.   Medications reviewed and include: B-complex with C, epoetin, lasix, heparin, insulin   Labs reviewed: K 4.5 wnl, P 5.6(H), Mg 2.4 wnl- 5/1 Hgb 10.2(L), Hct 32.1(L) cbgs- 39, 36, 235, 162, 109 x 24 hrs  Diet Order:   Diet Order            DIET - DYS 1 Room service appropriate? Yes with Assist; Fluid consistency: Thin  Diet effective now             EDUCATION NEEDS:   No education needs have been identified at this time  Skin:  Skin Assessment: Reviewed RN Assessment  Last BM:  5/4- type 5  Height:   Ht Readings from Last 1 Encounters:  08/16/19 4' 11"  (1.499 m)   Weight:   Wt Readings from Last 1 Encounters:  08/26/19 31.9 kg   Ideal Body Weight:  43.2 kg  BMI:  Body mass index is 14.22 kg/m.  Estimated Nutritional Needs:   Kcal:  1200-1400  Protein:  60-70 grams  Fluid:  UOP + 1 L  Koleen Distance MS, RD, LDN Please refer to [facility] for RD and/or RD on-call/weekend/after  hours pager

## 2019-08-26 NOTE — Evaluation (Signed)
Physical Therapy Evaluation Patient Details Name: Amber Dixon MRN: [id number] DOB: [birth year] Today's Date: 08/26/2019   History of Present Illness  69 y.o. Hispanic female with a known history of type 2 diabetes mellitus, hypertension and chronic kidney disease, who presented to the emergency room with acute onset of altered mental status with confusion and decreased responsiveness with associated elevated blood pressure.  She was admitted at [facility] recently with similar.    Clinical Impression  Interpreter utilized t/o PT exam.  Limited eval secondary to pt reporting she is feeling too weak and tired to try sitting/standing up today. Assures me she will feel stronger tomorrow and she will do more then.  She reports that she is able to ambulate relatively well with walker and is confident that when she builds some strength she will be able to get around.  She did not initially want to do much of anything, but did agree to do some supine exercises (reports R LE weakness is baseline since hip fx a few years ago) but was found to be very soilded with runny stool, nursing notified to help with clean up.  Difficult to really know what level of PT she will need at d/c as this was a very limited eval but pt confident that she will be able to return home when she is feeling stronger, promises she will get up with PT tomorrow.     Follow Up Recommendations Supervision - Intermittent;Home health PT(per progress, likely will not qualify for rehab)    Equipment Recommendations  None recommended by PT    Recommendations for Other Services       Precautions / Restrictions Precautions Precautions: Fall Restrictions Weight Bearing Restrictions: No      Mobility  Bed Mobility Overal bed mobility: (refuses to do mobility today, despite much cuing "too tired")                Transfers                    Ambulation/Gait             General Gait Details: Pt states she  will definitely do some walking tomorrow, but is too weak and tired to so do today  Stairs            Wheelchair Mobility    Modified Rankin (Stroke Patients Only)       Balance Overall balance assessment: (not tested as she refused to even sit EOB this date)                                           Pertinent Vitals/Pain Pain Assessment: Faces Faces Pain Scale: Hurts a little bit Pain Location: minimal pain in R LE during activity    Home Living Family/patient expects to be discharged to:: Private residence Living Arrangements: Children Available Help at Discharge: Family;Available 24 hours/day Type of Home: House Home Access: Level entry     Home Layout: One level Home Equipment: Walker - 2 wheels Additional Comments: has use RW since R hip fx/ORIF years ago    Prior Function Level of Independence: Independent with assistive device(s)         Comments: reports that she is able to get out of the home running errands with family, etc     Hand Dominance        Extremity/Trunk  Assessment   Upper Extremity Assessment Upper Extremity Assessment: Generalized weakness    Lower Extremity Assessment Lower Extremity Assessment: Generalized weakness(R hip/LE grossly 3/5, L grossly 4/5)       Communication   Communication: Prefers language other than [place];Interpreter utilized(Interpreter Livia Snellen [phone])  Cognition Arousal/Alertness: Awake/alert Behavior During Therapy: WFL for tasks assessed/performed Overall Cognitive Status: Within Functional Limits for tasks assessed                                 General Comments: Pt able to answer all questions asked of her, was fatigued/tired t/o session      General Comments General comments (skin integrity, edema, etc.): We were able to do a few supine exercises then pt was found to have a very loose stool in bed, nursing notified for clean up further activity deferred     Exercises     Assessment/Plan    PT Assessment Patient needs continued PT services  PT Problem List Decreased strength;Decreased range of motion;Decreased activity tolerance;Decreased balance;Decreased mobility;Decreased coordination;Decreased knowledge of use of DME;Decreased safety awareness;Pain       PT Treatment Interventions DME instruction;Gait training;Functional mobility training;Therapeutic activities;Therapeutic exercise;Balance training;Cognitive remediation;Patient/family education    PT Goals (Current goals can be found in the Care Plan section)  Acute Rehab PT Goals Patient Stated Goal: Go home PT Goal Formulation: With patient Time For Goal Achievement: 09/09/19 Potential to Achieve Goals: Fair    Frequency Min 2X/week   Barriers to discharge        Co-evaluation               AM-PAC PT "6 Clicks" Mobility  Outcome Measure Help needed turning from your back to your side while in a flat bed without using bedrails?: A Lot Help needed moving from lying on your back to sitting on the side of a flat bed without using bedrails?: A Lot Help needed moving to and from a bed to a chair (including a wheelchair)?: A Lot Help needed standing up from a chair using your arms (e.g., wheelchair or bedside chair)?: A Lot Help needed to walk in hospital room?: A Lot Help needed climbing 3-5 steps with a railing? : A Lot 6 Click Score: 12    End of Session   Activity Tolerance: Patient limited by fatigue Patient left: with bed alarm set;with call bell/phone within reach;with nursing/sitter in room   PT Visit Diagnosis: Muscle weakness (generalized) (M62.81);Difficulty in walking, not elsewhere classified (R26.2)    Time: 9480-1655 PT Time Calculation (min) (ACUTE ONLY): 20 min   Charges:   PT Evaluation $PT Eval Low Complexity: 1 Low          Kreg Shropshire, DPT 08/26/2019, 5:40 PM

## 2019-08-26 NOTE — Progress Notes (Signed)
Hypoglycemic Event  CBG: 39  Treatment: 1 amp of D50  Symptoms: lethargy  Follow-up CBG: Time:0430 CBG Result:235  Possible Reasons for Event: Lantus 10 u at bedtime with out snack  Comments/MD notified:On-call provider, Rufina Falco, NP notified of glucose and treatment. Barbaraann Faster, RN; 4:35 AM; 08/26/2019

## 2019-08-26 NOTE — Progress Notes (Signed)
PT Cancellation Note  Patient Details Name: Amber Dixon MRN: [id number] DOB: 27-Aug-1950   Cancelled Treatment:    Reason Eval/Treat Not Completed: Patient at procedure or test/unavailable Spoke with nursing, pt out of room for dialysis.  Will continue to follow and attempt PT when pt available and appropriate.  Kreg Shropshire, DPT 08/26/2019, 10:48 AM

## 2019-08-26 NOTE — Progress Notes (Signed)
Pt tolerated HD tx well no issues ufg achieved cvc wdl avf +/+

## 2019-08-27 LAB — GLUCOSE, CAPILLARY
Glucose-Capillary: 105 mg/dL — ABNORMAL HIGH (ref 70–99)
Glucose-Capillary: 155 mg/dL — ABNORMAL HIGH (ref 70–99)
Glucose-Capillary: 275 mg/dL — ABNORMAL HIGH (ref 70–99)
Glucose-Capillary: 278 mg/dL — ABNORMAL HIGH (ref 70–99)
Glucose-Capillary: 415 mg/dL — ABNORMAL HIGH (ref 70–99)
Glucose-Capillary: 76 mg/dL (ref 70–99)
Glucose-Capillary: 93 mg/dL (ref 70–99)
Glucose-Capillary: 97 mg/dL (ref 70–99)

## 2019-08-27 NOTE — Hospital Course (Signed)
Medical records reviewed and are as summarized below:   Amber Dixon  is a 69 y.o. Hispanic female with a known history of type 2 diabetes mellitus, hypertension and chronic kidney disease, who presented to the emergency room with headache, dizziness, significantly elevated blood pressure and acute onset of altered mental status with confusion and decreased responsiveness on 08/16/2019.  The patient was seen in [facility] on 4/22 for elevated blood pressure and similar confusion after hemodialysis which has resolved with improved blood pressure.   On admission, her blood pressure was 216/111, she was febrile with temperature 102.2 and tachycardic with heart rate in the 120s.  She was admitted to the hospital for acute encephalopathy and probable sepsis.  She was found lethargic and minimally responsive.  She was treated with empiric IV antibiotics.  She was seen in consultation by the nephrologist and she underwent hemodialysis.  Her blood pressure has slowly improved.  She was also seen by the neurologist because of acute encephalopathy.  CT head and MRI brain did not show any acute abnormality.  Blood cultures were negative and there was no evidence of infection so antibiotics was discontinued.  Sepsis was ruled out.  Encephalopathy was thought to be due to metabolic encephalopathy versus hypertensive encephalopathy.   Encephalopathy slowly improved.  She required enteral nutrition via nasogastric tube.  Mental status is back to baseline and she has been able to tolerate a regular diet.  She developed nausea, abdominal cramps and diarrhea.

## 2019-08-27 NOTE — Progress Notes (Signed)
PROGRESS NOTE    Amber Dixon   NAT:[id number]  DOB: [birth year]  PCP: Patient, No Pcp Per    DOA: 08/16/2019 LOS: 11   Brief Narrative   Medical records reviewed and are as summarized below:   Amber Dixon  is a 69 y.o. Hispanic female with a known history of type 2 diabetes mellitus, hypertension and chronic kidney disease, who presented to the emergency room with headache, dizziness, significantly elevated blood pressure and acute onset of altered mental status with confusion and decreased responsiveness on 08/16/2019.  The patient was seen in [facility] on 4/22 for elevated blood pressure and similar confusion after hemodialysis which has resolved with improved blood pressure.   On admission, her blood pressure was 216/111, she was febrile with temperature 102.2 and tachycardic with heart rate in the 120s.  She was admitted to the hospital for acute encephalopathy and probable sepsis.  She was found lethargic and minimally responsive.  She was treated with empiric IV antibiotics.  She was seen in consultation by the nephrologist and she underwent hemodialysis.  Her blood pressure has slowly improved.  She was also seen by the neurologist because of acute encephalopathy.  CT head and MRI brain did not show any acute abnormality.  Blood cultures were negative and there was no evidence of infection so antibiotics was discontinued.  Sepsis was ruled out.  Encephalopathy was thought to be due to metabolic encephalopathy versus hypertensive encephalopathy.   Encephalopathy slowly improved.  She required enteral nutrition via nasogastric tube.  Mental status is back to baseline and she has been able to tolerate a regular diet.  She developed nausea, abdominal cramps and diarrhea.    Assessment & Plan   Principal Problem:   Encephalopathy Active Problems:   ESRD (end stage renal disease) on dialysis ([place])   Altered mental status   DNR (do not  resuscitate) discussion   Palliative care by specialist   Protein-calorie malnutrition, severe   SIRS: No clear source of infection so far.  Antibiotics have been discontinued.  Nausea, watery diarrhea and abdominal cramps: resolved.  Possibly was due to NG tube feeds.  Imodium as needed.  Doubt C. difficile infection at this time but will continue to monitor. Encouraged adequate oral hydration.   Acute metabolic encephalopathy: Improved.  It appears patient is at her baseline.  Work-up including CT head, MRI brain, TFT, ammonia and cultures unrevealing.  Suspect this was hypertensive encephalopathy.      Hypertension - Continue amlodipine and clonidine patch. Hypertensive emergency - present on admission, resolved.  Type 2 diabetes mellitus with hypoglycemic episode: Lantus discontinued since enteral/NGT nutrition has been discontinued.  Continue NovoLog as needed.  Continue to monitor glucose levels closely.  ESRD/bone marrow disorder: On HD TTS.  Nephrology following for dialysis.  Anemia of chronic disease: H&H stable no active bleeding. Monitor H&H  Severe Protein Calorie Malnutrition - NG tube placed 4/28, since removed.  Tolerating diet.  Continue Nepro supplement drinks.  Underweight - Patient BMI: Body mass index is 14.68 kg/m.   DVT prophylaxis: heparin  Diet:  Diet Orders (From admission, onward)    Start     Ordered   08/25/19 1227  DIET - DYS 1 Room service appropriate? Yes with Assist; Fluid consistency: Thin  Diet effective now    Comments: Extra Gravy on meats, butter on potatoes. NO ICE IN DRINKS -- LIKES ROOM TEMP DRINKS. Puddings, Yogurt TID meals. Soups at lunch/dinner.  Question Answer  Comment  Room service appropriate? Yes with Assist   Fluid consistency: Thin      08/25/19 1228            Code Status: Full Code    Subjective 08/27/19    Patient seen at bedside just after breakfast.  No acute events reported.  She reports she feels well.   Denies fever/chills, pain or other complaints.   Disposition Plan & Communication   Status is: Inpatient  Remains inpatient appropriate because:she requires SNF placement and is unable to safely return home.     Dispo: The patient is from: Home              Anticipated d/c is to: SNF              Anticipated d/c date is: > 3 days              Patient currently is medically stable to d/c.   Family Communication: none at bedside, will attempt to call   Consults, Procedures, Significant Events   Consultants:   Nephrology  Procedures:   Dialysis     Objective   Vitals:   08/26/19 2154 08/27/19 0415 08/27/19 0416 08/27/19 1205  BP: (!) 142/60 (!) 129/55  (!) 152/61  Pulse: 88 80  82  Resp: 16 16  18   Temp: 98.4 F (36.9 C) 98.2 F (36.8 C)  98.5 F (36.9 C)  TempSrc: Oral Oral  Oral  SpO2: 100% 100%  99%  Weight:   33 kg   Height:        Intake/Output Summary (Last 24 hours) at 08/27/2019 1636 Last data filed at 08/27/2019 1345 Gross per 24 hour  Intake 290 ml  Output --  Net 290 ml   Filed Weights   08/25/19 0500 08/26/19 0422 08/27/19 0416  Weight: 30.9 kg 31.9 kg 33 kg    Physical Exam:  General exam: awake, alert, no acute distress, underweight, frail appearing HEENT: moist mucus membranes, hearing grossly normal  Respiratory system: CTAB, no wheezes, rales or rhonchi, normal respiratory effort. Cardiovascular system: normal S1/S2, RRR, no JVD, murmurs, rubs, gallops, no pedal edema.   Gastrointestinal system: soft, NT, ND, no HSM felt, +bowel sounds. Central nervous system: alert, no gross focal neurologic deficits, normal speech Extremities: moves all, no cyanosis, normal tone  Labs   Data Reviewed: I have personally reviewed following labs and imaging studies  CBC: Recent Labs  Lab 08/21/19 1400 08/23/19 0417  WBC 8.2 9.6  HGB 10.7* 10.2*  HCT 32.7* 32.1*  MCV 82.2 83.8  PLT 219 762   Basic Metabolic Panel: Recent Labs  Lab  08/21/19 0447 08/21/19 1400 08/22/19 0423 08/23/19 0417 08/26/19 0618 08/26/19 0935  NA  --  145 139 141  --  137  K  --  3.5 4.5 4.5  --  3.9  CL  --  100 97* 97*  --  97*  CO2  --  27 32 32  --  31  GLUCOSE  --  185* 267* 177* 149* 169*  BUN  --  93* 36* 66*  --  46*  CREATININE  --  6.81* 3.23* 4.84*  --  3.51*  CALCIUM  --  7.8* 7.9* 7.7*  --  7.5*  MG 2.6*  --  2.0 2.4  --   --   PHOS 7.2* 7.3* 4.3 5.6*  --  3.7   GFR: Estimated Creatinine Clearance: 7.8 mL/min (A) (by C-G formula based on SCr  of 3.51 mg/dL (H)). Liver Function Tests: Recent Labs  Lab 08/21/19 1400 08/26/19 0935  ALBUMIN 3.0* 2.4*   No results for input(s): LIPASE, AMYLASE in the last 168 hours. Recent Labs  Lab 08/21/19 0447 08/22/19 0423 08/23/19 0417 08/24/19 0403  AMMONIA 23 32 20 11   Coagulation Profile: No results for input(s): INR, PROTIME in the last 168 hours. Cardiac Enzymes: No results for input(s): CKTOTAL, CKMB, CKMBINDEX, TROPONINI in the last 168 hours. BNP (last 3 results) No results for input(s): PROBNP in the last 8760 hours. HbA1C: No results for input(s): HGBA1C in the last 72 hours. CBG: Recent Labs  Lab 08/26/19 2000 08/27/19 0007 08/27/19 0411 08/27/19 0733 08/27/19 1203  GLUCAP 225* 76 93 97 155*   Lipid Profile: No results for input(s): CHOL, HDL, LDLCALC, TRIG, CHOLHDL, LDLDIRECT in the last 72 hours. Thyroid Function Tests: No results for input(s): TSH, T4TOTAL, FREET4, T3FREE, THYROIDAB in the last 72 hours. Anemia Panel: No results for input(s): VITAMINB12, FOLATE, FERRITIN, TIBC, IRON, RETICCTPCT in the last 72 hours. Sepsis Labs: No results for input(s): PROCALCITON, LATICACIDVEN in the last 168 hours.  Recent Results (from the past 240 hour(s))  MRSA PCR Screening     Status: None   Collection Time: 08/26/19  4:00 AM   Specimen: Nasal Mucosa; Nasopharyngeal  Result Value Ref Range Status   MRSA by PCR NEGATIVE NEGATIVE Final    Comment:         The GeneXpert MRSA Assay (FDA approved for NASAL specimens only), is one component of a comprehensive MRSA colonization surveillance program. It is not intended to diagnose MRSA infection nor to guide or monitor treatment for MRSA infections. Performed at [facility], [address], [place], [place] [address]       Imaging Studies   No results found.   Medications   Scheduled Meds: . amLODipine  5 mg Oral Daily  . chlorhexidine  15 mL Mouth Rinse BID  . Chlorhexidine Gluconate Cloth  6 each Topical Daily  . cloNIDine  0.2 mg Transdermal Weekly  . epoetin (EPOGEN/PROCRIT) injection  4,000 Units Intravenous Q T,Th,Sa-HD  . feeding supplement (NEPRO CARB STEADY)  237 mL Oral BID BM  . furosemide  80 mg Oral QODAY  . heparin  5,000 Units Subcutaneous Q12H  . insulin aspart  0-6 Units Subcutaneous Q4H  . mouth rinse  15 mL Mouth Rinse q12n4p  . multivitamin  1 tablet Oral QHS   Continuous Infusions:     LOS: 11 days    Time spent: 30 minutes    Ezekiel Slocumb, DO [facility]  08/27/2019, 4:36 PM    If 7PM-7AM, please contact night-coverage. How to contact the [facility] Attending or Consulting provider [place] or covering provider during after hours [place], for this patient?    1. Check the care team in [facility] and look for a) attending/consulting [facility] provider listed and b) the [facility] team listed 2. Log into [email or web address] and use [place]'s universal password to access. If you do not have the password, please contact the hospital operator. 3. Locate the [facility] provider you are looking for under [facility] and page to a number that you can be directly reached. 4. If you still have difficulty reaching the provider, please page the [facility] (Director on Call) for the Hospitalists listed on amion for assistance.

## 2019-08-27 NOTE — Progress Notes (Signed)
[facility]  ROUNDING NOTE   Subjective:   No acute complaints Nurse reported that patient was able to eat 100% of her breakfast today No shortness of breath or pain   Objective:  Vital signs in last 24 hours:  Temp:  [97.8 F (36.6 C)-98.6 F (37 C)] 98.5 F (36.9 C) (05/05 1205) Pulse Rate:  [80-88] 82 (05/05 1205) Resp:  [13-25] 18 (05/05 1205) BP: (129-173)/(55-88) 152/61 (05/05 1205) SpO2:  [99 %-100 %] 99 % (05/05 1205) Weight:  [33 kg] 33 kg (05/05 0416)  Weight change: 1.043 kg Filed Weights   08/25/19 0500 08/26/19 0422 08/27/19 0416  Weight: 30.9 kg 31.9 kg 33 kg    Intake/Output: I/O last 3 completed shifts: In: 540 [P.O.:540] Out: 4 [Other:4]   Intake/Output this shift:  No intake/output data recorded.  Physical Exam: General: NAD, laying in bed  Head: Normocephalic, atraumatic. Moist oral mucosal membranes  Eyes: Anicteric,   Lungs:  Clear to auscultation  Heart: Regular rate and rhythm  Abdomen:  Soft, nontender  Extremities:  no peripheral edema.  Neurologic:  Resting quietly  Skin: No lesions  Access: Right IJ PermCath    Basic Metabolic Panel: Recent Labs  Lab 08/20/19 1415 08/20/19 1415 08/21/19 0447 08/21/19 1400 08/21/19 1400 08/22/19 0423 08/23/19 0417 08/26/19 0618 08/26/19 0935  NA  --   --   --  145  --  139 141  --  137  K  --   --   --  3.5  --  4.5 4.5  --  3.9  CL  --   --   --  100  --  97* 97*  --  97*  CO2  --   --   --  27  --  32 32  --  31  GLUCOSE  --   --   --  185*  --  267* 177* 149* 169*  BUN  --   --   --  93*  --  36* 66*  --  46*  CREATININE  --   --   --  6.81*  --  3.23* 4.84*  --  3.51*  CALCIUM  --   --   --  7.8*   < > 7.9* 7.7*  --  7.5*  MG 2.3  --  2.6*  --   --  2.0 2.4  --   --   PHOS 7.0*   < > 7.2* 7.3*  --  4.3 5.6*  --  3.7   < > = values in this interval not displayed.    Liver Function Tests: Recent Labs  Lab 08/21/19 1400 08/26/19 0935  ALBUMIN 3.0* 2.4*   No  results for input(s): LIPASE, AMYLASE in the last 168 hours. Recent Labs  Lab 08/22/19 0423 08/23/19 0417 08/24/19 0403  AMMONIA 32 20 11    CBC: Recent Labs  Lab 08/21/19 1400 08/23/19 0417  WBC 8.2 9.6  HGB 10.7* 10.2*  HCT 32.7* 32.1*  MCV 82.2 83.8  PLT 219 204    Cardiac Enzymes: No results for input(s): CKTOTAL, CKMB, CKMBINDEX, TROPONINI in the last 168 hours.  BNP: Invalid input(s): POCBNP  CBG: Recent Labs  Lab 08/26/19 2000 08/27/19 0007 08/27/19 0411 08/27/19 0733 08/27/19 1203  GLUCAP 225* 76 93 97 155*    Microbiology: Results for orders placed or performed during the hospital encounter of 08/16/19  Urine culture     Status: None   Collection Time: 08/16/19  8:23 PM   Specimen: In/Out Cath Urine  Result Value Ref Range Status   Specimen Description   Final    IN/OUT CATH URINE Performed at [facility], [address] [place], [place] [address]    Special Requests   Final    NONE Performed at [facility], [address], [place], [place] [address]    Culture   Final    NO GROWTH Performed at [facility], [place] [address], [place], [place] [address]    Report Status 08/18/2019 FINAL  Final  Blood Culture (routine x 2)     Status: None   Collection Time: 08/16/19  8:29 PM   Specimen: BLOOD  Result Value Ref Range Status   Specimen Description BLOOD BLOOD LEFT FOREARM  Final   Special Requests   Final    BOTTLES DRAWN AEROBIC AND ANAEROBIC Blood Culture adequate volume   Culture   Final    NO GROWTH 5 DAYS Performed at [facility], [address] [place], [place] [address]    Report Status 08/21/2019 FINAL  Final  Blood Culture (routine x 2)     Status: None   Collection Time: 08/16/19  8:29 PM   Specimen: BLOOD  Result Value Ref Range Status   Specimen Description BLOOD BLOOD LEFT ARM  Final   Special Requests   Final    BOTTLES DRAWN AEROBIC AND ANAEROBIC Blood Culture results may not be  optimal due to an inadequate volume of blood received in culture bottles   Culture   Final    NO GROWTH 5 DAYS Performed at [facility], [address] [place],  [address]    Report Status 08/21/2019 FINAL  Final  Respiratory Panel by RT PCR (Flu A&B, Covid) - Urine, Clean Catch     Status: None   Collection Time: 08/16/19  8:47 PM   Specimen: Urine, Clean Catch  Result Value Ref Range Status   SARS Coronavirus 2 by RT PCR NEGATIVE NEGATIVE Final    Comment: (NOTE) SARS-CoV-2 target nucleic acids are NOT DETECTED. The SARS-CoV-2 RNA is generally detectable in upper respiratoy specimens during the acute phase of infection. The lowest concentration of SARS-CoV-2 viral copies this assay can detect is 131 copies/mL. A negative result does not preclude SARS-Cov-2 infection and should not be used as the sole basis for treatment or other patient management decisions. A negative result may occur with  improper specimen collection/handling, submission of specimen other than nasopharyngeal swab, presence of viral mutation(s) within the areas targeted by this assay, and inadequate number of viral copies (<131 copies/mL). A negative result must be combined with clinical observations, patient history, and epidemiological information. The expected result is Negative. Fact Sheet for Patients:  PinkCheek.be Fact Sheet for Healthcare Providers:  GravelBags.it This test is not yet ap proved or cleared by the [place] FDA and  has been authorized for detection and/or diagnosis of SARS-CoV-2 by FDA under an Emergency Use Authorization (EUA). This EUA will remain  in effect (meaning this test can be used) for the duration of the COVID-19 declaration under Section 564(b)(1) of the Act, 21 U.S.C. section 360bbb-3(b)(1), unless the authorization is terminated or revoked sooner.    Influenza A by PCR NEGATIVE NEGATIVE Final    Influenza B by PCR NEGATIVE NEGATIVE Final    Comment: (NOTE) The Xpert Xpress SARS-CoV-2/FLU/RSV assay is intended as an aid in  the diagnosis of influenza from Nasopharyngeal swab specimens and  should not be used as a sole basis for treatment. Nasal washings and  aspirates are unacceptable for Xpert Xpress SARS-CoV-2/FLU/RSV  testing. Fact Sheet for Patients: PinkCheek.be Fact Sheet for Healthcare Providers: GravelBags.it This test is not yet approved or cleared by the [place] FDA and  has been authorized for detection and/or diagnosis of SARS-CoV-2 by  FDA under an Emergency Use Authorization (EUA). This EUA will remain  in effect (meaning this test can be used) for the duration of the  Covid-19 declaration under Section 564(b)(1) of the Act, 21  U.S.C. section 360bbb-3(b)(1), unless the authorization is  terminated or revoked. Performed at [facility], [address] [place], [place] [address]   MRSA PCR Screening     Status: None   Collection Time: 08/26/19  4:00 AM   Specimen: Nasal Mucosa; Nasopharyngeal  Result Value Ref Range Status   MRSA by PCR NEGATIVE NEGATIVE Final    Comment:        The GeneXpert MRSA Assay (FDA approved for NASAL specimens only), is one component of a comprehensive MRSA colonization surveillance program. It is not intended to diagnose MRSA infection nor to guide or monitor treatment for MRSA infections. Performed at [facility], [address] [place] [place], [place] [address]     Coagulation Studies: No results for input(s): LABPROT, INR in the last 72 hours.  Urinalysis: No results for input(s): COLORURINE, LABSPEC, PHURINE, GLUCOSEU, HGBUR, BILIRUBINUR, KETONESUR, PROTEINUR, UROBILINOGEN, NITRITE, LEUKOCYTESUR in the last 72 hours.  Invalid input(s): APPERANCEUR    Imaging: No results found.   Medications:    . amLODipine  5 mg Oral Daily  .  chlorhexidine  15 mL Mouth Rinse BID  . Chlorhexidine Gluconate Cloth  6 each Topical Daily  . cloNIDine  0.2 mg Transdermal Weekly  . epoetin (EPOGEN/PROCRIT) injection  4,000 Units Intravenous Q T,Th,Sa-HD  . feeding supplement (NEPRO CARB STEADY)  237 mL Oral BID BM  . furosemide  80 mg Oral QODAY  . heparin  5,000 Units Subcutaneous Q12H  . insulin aspart  0-6 Units Subcutaneous Q4H  . mouth rinse  15 mL Mouth Rinse q12n4p  . multivitamin  1 tablet Oral QHS   acetaminophen **OR** acetaminophen, hydrALAZINE, ipratropium-albuterol, labetalol, loperamide, magnesium hydroxide, ondansetron **OR** ondansetron (ZOFRAN) IV  Assessment/ Plan:  [name] is a 69 y.o. Hispanic female with end stage renal disease on hemodialysis, diabetes mellitus, hypertension, who was admitted 08/16/2019 for Encephalopathy [G93.40] Sepsis ([place]) [A41.9] Fever, unspecified fever cause [R50.9] Altered mental status, unspecified altered mental status type [R41.82]  [facility] ([place]) [facility] Right AVF  1. End stage renal disease: TTS schedule.  Patient has moved to [facility] and will be transitioning to [place] and will be followed by [facility] Next hemodialysis will be scheduled for Thursday  2. Hypertension: Home regimen of amlodipine and furosemide. PRN hydralazine BP Readings from Last 3 Encounters:  08/27/19 (!) 152/61  08/14/19 (!) 161/100  07/30/19 (!) 185/79    3. Anemia of chronic kidney disease:   - EPO with HD treatment for hgb <11.  Lab Results  Component Value Date   HGB 10.2 (L) 08/23/2019     4. Secondary Hyperparathyroidism: calcium and phosphorus at goal. Not currently on binders.  Lab Results  Component Value Date   PTH 269 (H) 03/22/2019   PTH Comment 03/22/2019   CALCIUM 7.5 (L) 08/26/2019   CAION 0.90 (L) 06/09/2019   PHOS 3.7 08/26/2019  5. Encephalopathy:  Evaluated by neurologist  4/30 Depressed mental status is felt to be secondary to metabolic encephalopathy MRI brain no acute changes EEG consistent with severe metabolic encephalopathy Improved and appears to be back to baseline    LOS: 11 Kazuko Clemence 5/5/202112:13 PM

## 2019-08-27 NOTE — Progress Notes (Addendum)
Physical Therapy Treatment Patient Details Name: Amber Dixon MRN: [id number] DOB: 08-17-1950 Today's Date: 08/27/2019    History of Present Illness 69 y.o. Hispanic female with a known history of type 2 diabetes mellitus, hypertension and chronic kidney disease, who presented to the emergency room with acute onset of altered mental status with confusion and decreased responsiveness with associated elevated blood pressure.  She was admitted at [facility] recently with similar.      PT Comments    Patient received in bed, interpreter utilized for session via telehealth, Levada Dy [phone]. Patient appearing lethargic, declining to participate initially. Requires max encouragement to do any mobility. She eventually agreed to try to sit on the side of the bed. She requires max assist to perform. Once seated for a minute or two she requested to lie back down and just returned to side-lying. She is very reluctant to perform any mobility at this time due to reported weakness. She will continue to benefit from skilled PT while here (if agreeable) to improve strength and functional independence for return home at discharge if able.       Follow Up Recommendations  SNF     Equipment Recommendations  None recommended by PT;Other (comment)(TBD) 3 in 1, wheelchair   Recommendations for Other Services       Precautions / Restrictions Precautions Precautions: Fall Restrictions Weight Bearing Restrictions: No    Mobility  Bed Mobility Overal bed mobility: Needs Assistance Bed Mobility: Supine to Sit;Sit to Supine     Supine to sit: Max assist Sit to supine: Mod assist   General bed mobility comments: assistance provided for BLE management + trunk, she required max encouragement for mobility, therefore required max assist to achieve sitting up on side of bed.  Transfers Overall transfer level: Needs assistance               General transfer comment: attempted sit to stand transfer  assessment, but pt ultimately unwilling.  Per nursing report, pt requires max assist for stand pivot transfer to [facility]  Ambulation/Gait             General Gait Details: patient refusing any oob mobility this day states she is too weak and can't. Despite encouragement patient continued to refuse. Due to patient being unable to ambulate at this time she will benefit from manual wheelchair in the home.    Stairs             Wheelchair Mobility    Modified Rankin (Stroke Patients Only)       Balance Overall balance assessment: Needs assistance Sitting-balance support: Feet unsupported;No upper extremity supported Sitting balance-Leahy Scale: Poor Sitting balance - Comments: varied level of assist for seated balance, ranging from CGA to mod assist.  Pt unable to reach feet to floor while seated EOB       Standing balance comment: not tested, poor to zero balance per nursing report                            Cognition Arousal/Alertness: Lethargic Behavior During Therapy: [facility] for tasks assessed/performed Overall Cognitive Status: Within Functional Limits for tasks assessed                                 General Comments: Pt able to answer all questions asked of her, was fatigued and unwilling to participate in mobility during session  Exercises Other Exercises Other Exercises: educated pt on OT role, safety precautions, importance of self care and OOB activity, bed mobility Other Exercises: provided max assist for bed mobility    General Comments        Pertinent Vitals/Pain Pain Assessment: Faces Faces Pain Scale: Hurts even more Pain Location: patient reports her legs hurt with bed mobility Pain Descriptors / Indicators: Discomfort;Grimacing;Guarding;Moaning Pain Intervention(s): Monitored during session;Repositioned    Home Living Family/patient expects to be discharged to:: Private residence Living Arrangements:  Children Available Help at Discharge: Family;Available 24 hours/day(pt reports decreased assistance during the day 2/2 relatives working) Type of Home: House Home Access: Level entry   Home Layout: One [place]: Environmental consultant - 2 wheels Additional Comments: has use RW since R hip fx/ORIF years ago    Prior Function Level of Independence: Independent with assistive device(s)      Comments: reports that she is able to get out of the home running errands with family, etc   PT Goals (current goals can now be found in the care plan section) Acute Rehab PT Goals Patient Stated Goal: Go home PT Goal Formulation: With patient Time For Goal Achievement: 09/09/19 Potential to Achieve Goals: Fair Progress towards PT goals: Not progressing toward goals - comment    Frequency    Min 2X/week      PT Plan Current plan remains appropriate    Co-evaluation              AM-PAC PT "6 Clicks" Mobility   Outcome Measure  Help needed turning from your back to your side while in a flat bed without using bedrails?: A Lot Help needed moving from lying on your back to sitting on the side of a flat bed without using bedrails?: A Lot Help needed moving to and from a bed to a chair (including a wheelchair)?: Total Help needed standing up from a chair using your arms (e.g., wheelchair or bedside chair)?: Total Help needed to walk in hospital room?: Total Help needed climbing 3-5 steps with a railing? : Total 6 Click Score: 8    End of Session   Activity Tolerance: Patient limited by fatigue;Patient limited by lethargy Patient left: in bed;with bed alarm set;with call bell/phone within reach Nurse Communication: Mobility status PT Visit Diagnosis: Muscle weakness (generalized) (M62.81);Other abnormalities of gait and mobility (R26.89);Difficulty in walking, not elsewhere classified (R26.2);Pain Pain - Right/Left: Left(bilateral) Pain - part of body: Leg     Time: 1130-1149 PT Time  Calculation (min) (ACUTE ONLY): 19 min  Charges:  $Therapeutic Activity: 8-22 mins                     Annikah Lovins, PT, GCS 08/27/19,12:31 PM

## 2019-08-27 NOTE — Evaluation (Addendum)
Occupational Therapy Evaluation Patient Details Name: Amber Dixon MRN: [id number] DOB: 08-12-1950 Today's Date: 08/27/2019    History of Present Illness 69 y.o. Hispanic female with a known history of type 2 diabetes mellitus, hypertension and chronic kidney disease, who presented to the emergency room with acute onset of altered mental status with confusion and decreased responsiveness with associated elevated blood pressure.  She was admitted at [facility] recently with similar.     Clinical Impression   Pt presents to OT with generalized weakness, fatigue, and poor endurance.  She reports mod I with ADLs using RW prior to admission.  She lives with her family, but reports that assistance is limited during the daytime due to family working.  Pt was unwilling to participate in mobility assessment today.  She required max assist for bed mobility due to weakness and fatigue.  Per nursing report, pt requires max assist for stand pivot transfer to [facility].  Pt likely requires max assist for grooming, bathing, feeding, and dressing due to weakness and fatigue.  She will benefit from continued skilled OT services in acute setting to address functional strengthening, endurance, and independence in ADLs.  Recommend SNF at discharge.    Follow Up Recommendations  SNF;Supervision/Assistance - 24 hour    Equipment Recommendations  Tub/shower seat, 3-in-1 bedside commode  Recommendations for Other Services       Precautions / Restrictions Precautions Precautions: Fall Restrictions Weight Bearing Restrictions: No      Mobility Bed Mobility Overal bed mobility: Needs Assistance Bed Mobility: Supine to Sit;Sit to Supine     Supine to sit: Max assist Sit to supine: Mod assist   General bed mobility comments: assistance provided for BLE management + trunk, she required max encouragement for mobility, therefore required max assist to achieve sitting up on side of bed.  Transfers Overall  transfer level: Needs assistance               General transfer comment: attempted sit to stand transfer assessment, but pt ultimately unwilling.  Per nursing report, pt requires max assist for stand pivot transfer to [facility]    Balance Overall balance assessment: Needs assistance Sitting-balance support: Feet unsupported;No upper extremity supported Sitting balance-Leahy Scale: Poor Sitting balance - Comments: varied level of assist for seated balance, ranging from CGA to mod assist.  Pt unable to reach feet to floor while seated EOB       Standing balance comment: not tested, poor to zero balance per nursing report                           ADL either performed or assessed with clinical judgement   ADL Overall ADL's : Needs assistance/impaired                                       General ADL Comments: Pt is grossly total assist in all ADLs.  She is unwilling to assist in mobility or self care tasks, with significant weakness and fatigue.  She reports getting assistance from nursing for bathing, toileting, dressing, etc.  Nursing reports pt is almost limp when attempting BSC stand pivot transfer.     Vision Patient Visual Report: Other (comment)(no reports of change from baseline, not formally assessed)       Perception     Praxis      Pertinent Vitals/Pain Pain Assessment:  Faces Faces Pain Scale: Hurts even more Pain Location: patient reports her legs hurt with bed mobility Pain Descriptors / Indicators: Discomfort;Grimacing;Guarding;Moaning Pain Intervention(s): Monitored during session;Repositioned     Hand Dominance Right   Extremity/Trunk Assessment Upper Extremity Assessment Upper Extremity Assessment: Generalized weakness(not formally assessed, but pt with signifant weakness in all limbs)   Lower Extremity Assessment Lower Extremity Assessment: Generalized weakness       Communication Communication Communication: Prefers  language other than [place];Interpreter utilized(Interpreter Levada Dy [phone])   Cognition Arousal/Alertness: Lethargic Behavior During Therapy: WFL for tasks assessed/performed Overall Cognitive Status: Within Functional Limits for tasks assessed                                 General Comments: Pt able to answer all questions asked of her, was fatigued and unwilling to participate in mobility during session   General Comments       Exercises Other Exercises Other Exercises: educated pt on OT role, safety precautions, importance of self care and OOB activity, bed mobility Other Exercises: provided max assist for bed mobility   Shoulder Instructions      Home Living Family/patient expects to be discharged to:: Private residence Living Arrangements: Children Available Help at Discharge: Family;Available 24 hours/day(pt reports decreased assistance during the day 2/2 relatives working) Type of Home: House Home Access: Level entry     [place]: One level         Biochemist, clinical: [place]: Environmental consultant - 2 wheels   Additional Comments: has use RW since R hip fx/ORIF years ago      Prior Functioning/Environment Level of Independence: Independent with assistive device(s)        Comments: reports that she is able to get out of the home running errands with family, etc        OT Problem List: Decreased strength;Decreased range of motion;Decreased activity tolerance;Impaired balance (sitting and/or standing);Decreased cognition;Decreased safety awareness;Decreased knowledge of use of DME or AE;Decreased knowledge of precautions      OT Treatment/Interventions: Self-care/ADL training;Therapeutic exercise;Energy conservation;DME and/or AE instruction;Therapeutic activities;Cognitive remediation/compensation;Patient/family education;Balance training    OT Goals(Current goals can be found in the care plan section) Acute Rehab OT Goals Patient  Stated Goal: Go home OT Goal Formulation: With patient Time For Goal Achievement: 09/10/19 Potential to Achieve Goals: Fair ADL Goals Pt Will Perform Lower Body Dressing: with mod assist;bed level;with adaptive equipment Pt Will Transfer to Toilet: with mod assist;bedside commode;stand pivot transfer(with LRAD) Additional ADL Goal #1: Pt will verbalize x3 fall prevention strategies with min assist for increased safety and independence in ADLs.  OT Frequency: Min 2X/week   Barriers to D/C: Decreased caregiver support          Co-evaluation              AM-PAC OT "6 Clicks" Daily Activity     Outcome Measure Help from another person eating meals?: A Lot Help from another person taking care of personal grooming?: A Lot Help from another person toileting, which includes using toliet, bedpan, or urinal?: Total Help from another person bathing (including washing, rinsing, drying)?: A Lot Help from another person to put on and taking off regular upper body clothing?: A Lot Help from another person to put on and taking off regular lower body clothing?: Total 6 Click Score: 10   End of Session    Activity Tolerance: Patient limited by  fatigue Patient left: in bed;with call bell/phone within reach;with bed alarm set  OT Visit Diagnosis: Other abnormalities of gait and mobility (R26.89);Muscle weakness (generalized) (M62.81)                Time: 3094-0768 OT Time Calculation (min): 18 min Charges:  OT General Charges $OT Visit: 1 Visit OT Evaluation $OT Eval Moderate Complexity: 1 Mod OT Treatments $Therapeutic Activity: 8-22 mins  Myrtie Hawk Lakenzie Mcclafferty, OTR/L 08/27/19, 1:35 PM

## 2019-08-27 NOTE — Progress Notes (Signed)
Dr Arbutus Ped requested that the patient be on 1229ml fluid restriction

## 2019-08-28 LAB — GLUCOSE, CAPILLARY
Glucose-Capillary: 100 mg/dL — ABNORMAL HIGH (ref 70–99)
Glucose-Capillary: 49 mg/dL — ABNORMAL LOW (ref 70–99)
Glucose-Capillary: 52 mg/dL — ABNORMAL LOW (ref 70–99)
Glucose-Capillary: 112 mg/dL — ABNORMAL HIGH (ref 70–99)
Glucose-Capillary: 113 mg/dL — ABNORMAL HIGH (ref 70–99)
Glucose-Capillary: 107 mg/dL — ABNORMAL HIGH (ref 70–99)
Glucose-Capillary: 256 mg/dL — ABNORMAL HIGH (ref 70–99)
Glucose-Capillary: 174 mg/dL — ABNORMAL HIGH (ref 70–99)

## 2019-08-28 LAB — HEMOGLOBIN AND HEMATOCRIT, BLOOD
HCT: 23 % — ABNORMAL LOW (ref 36.0–46.0)
Hemoglobin: 7.4 g/dL — ABNORMAL LOW (ref 12.0–15.0)

## 2019-08-28 LAB — CBC
HCT: 22.6 % — ABNORMAL LOW (ref 36.0–46.0)
Hemoglobin: 7.2 g/dL — ABNORMAL LOW (ref 12.0–15.0)
MCH: 27 pg (ref 26.0–34.0)
MCHC: 31.9 g/dL (ref 30.0–36.0)
MCV: 84.6 fL (ref 80.0–100.0)
Platelets: 288 10*3/uL (ref 150–400)
RBC: 2.67 MIL/uL — ABNORMAL LOW (ref 3.87–5.11)
RDW: 17 % — ABNORMAL HIGH (ref 11.5–15.5)
WBC: 6.7 10*3/uL (ref 4.0–10.5)
nRBC: 0 % (ref 0.0–0.2)

## 2019-08-28 LAB — OCCULT BLOOD X 1 CARD TO LAB, STOOL: Fecal Occult Bld: NEGATIVE

## 2019-08-28 MED ORDER — CALCITRIOL 0.25 MCG PO CAPS
0.2500 ug | ORAL_CAPSULE | Freq: Every day | ORAL | Status: DC
  Administered 2019-08-28 – 2019-09-05 (×9): 0.25 ug via ORAL
  Filled 2019-08-28 (×11): qty 1

## 2019-08-28 MED ORDER — DEXTROSE 50 % IV SOLN
INTRAVENOUS | Status: AC
  Filled 2019-08-28: qty 50

## 2019-08-28 NOTE — Progress Notes (Signed)
Pt tolerated HD tx well vitals stable AVF +/+ DC WDL UFG achieved 1L

## 2019-08-28 NOTE — Progress Notes (Signed)
CBG rechecked one hour post 8 oz nutritional shake: 112

## 2019-08-28 NOTE — Progress Notes (Signed)
Hypoglycemic Event  CBG: 49  Treatment: 8 oz juice/soda  Symptoms: None  Follow-up CBG: Time: 6948 CBG Result: 100  Possible Reasons for Event: Inadequate meal intake  Comments/MD notified: Notified [name], NP. Orders are to provide pt with nutritional shake, and recheck CBG in one hour.     Amber Dixon

## 2019-08-28 NOTE — Progress Notes (Signed)
Patient returned from HD.

## 2019-08-28 NOTE — TOC Progression Note (Signed)
Transition of Care [facility]) - Progression Note    Patient Details  Name: Amber Dixon MRN: [id number] Date of Birth: 13-Oct-1950  Transition of Care [facility]) CM/SW Contact  Beverly Sessions, RN Phone Number: 08/28/2019, 1:45 PM  Clinical Narrative:     PT and OT recommending SNF  Per financial counselor patient will not be a candidate for medicaid or SNF due to being undocumented.    RNCM called and updated daughter in law.  She is aware that patient will not be able to go to rehab at discharge.  She is in agreement to home health services.  Referral made to [facility] with [facility] as they are on for charity rotation.  They are not able to provide PT services at this time. T OC leadership notified.  Helene Kelp to reach out to other home health agencies to see if they are able to staff the case.   Per daughter in law 3 adults and 4 kids in the home.  Total monthly household income $2,200  Patient has a RW in the home.  Will need WC and BSC at discharge.  Juliann Pulse with [employer] given heads up referral for DME.   Patient has new patient appointment set up with [facility] on 09/03/19  Patient to discharge to her son and daughter in laws home: [place]   Expected Discharge Plan: Home/Self Care Barriers to Discharge: Continued Medical Work up  Expected Discharge Plan and Services Expected Discharge Plan: Home/Self Care       Living arrangements for the past 2 months: Single Family Home                                       Social Determinants of Health (SDOH) Interventions    Readmission Risk Interventions Readmission Risk Prevention Plan 08/18/2019  Transportation Screening Complete  Medication Review (RN Care Manager) Complete  PCP or Specialist appointment within 3-5 days of discharge Complete  HRI or [place] Complete

## 2019-08-28 NOTE — Progress Notes (Signed)
PT STABLE FOR HD TX VITALS STABLE AVF +/+ [facility] WDL UFG 1L [place]

## 2019-08-28 NOTE — Progress Notes (Addendum)
PROGRESS NOTE    Amber Dixon   GUY:[id number]  DOB: 06/21/1950  PCP: Patient, No Pcp Per    DOA: 08/16/2019 LOS: 12   Brief Narrative   Medical records reviewed and are as summarized below:   Amber Dixon  is a 69 y.o. Hispanic female with a known history of type 2 diabetes mellitus, hypertension and chronic kidney disease, who presented to the emergency room with headache, dizziness, significantly elevated blood pressure and acute onset of altered mental status with confusion and decreased responsiveness on 08/16/2019.  The patient was seen in [facility] on 4/22 for elevated blood pressure and similar confusion after hemodialysis which has resolved with improved blood pressure.   On admission, her blood pressure was 216/111, she was febrile with temperature 102.2 and tachycardic with heart rate in the 120s.  She was admitted to the hospital for acute encephalopathy and probable sepsis.  She was found lethargic and minimally responsive.  She was treated with empiric IV antibiotics.  She was seen in consultation by the nephrologist and she underwent hemodialysis.  Her blood pressure has slowly improved.  She was also seen by the neurologist because of acute encephalopathy.  CT head and MRI brain did not show any acute abnormality.  Blood cultures were negative and there was no evidence of infection so antibiotics was discontinued.  Sepsis was ruled out.  Encephalopathy was thought to be due to metabolic encephalopathy versus hypertensive encephalopathy.   Encephalopathy slowly improved.  She required enteral nutrition via nasogastric tube.  Mental status is back to baseline and she has been able to tolerate a regular diet.  She developed nausea, abdominal cramps and diarrhea.    Assessment & Plan   Principal Problem:   Encephalopathy Active Problems:   ESRD (end stage renal disease) on dialysis ([place])   Altered mental status   DNR (do not  resuscitate) discussion   Palliative care by specialist   Protein-calorie malnutrition, severe  Acute anemia superimposed on Anemia of chronic disease: Hbg dropped from 10.0 on 5/1 to 7.2 this morning (5/6).  No reports of bleeding.  FOBT is negative.  Patient gets Epo with dialysis as needed.   --repeat H&H this afternoon, CBC in AM --transfuse if Hbg < 7.0  SIRS: No clear source of infection identified.  Antibiotics have been discontinued and patient remains without signs of infection.  Nausea, watery diarrhea and abdominal cramps: resolved.  Possibly was due to NG tube feeds.  Imodium as needed.  Doubt C. difficile infection at this time but will continue to monitor. Encouraged adequate oral hydration.   Acute metabolic encephalopathy: Improved.  It appears patient is at her baseline.  Work-up including CT head, MRI brain, TFT, ammonia and cultures unrevealing.  Suspect this was hypertensive encephalopathy.      Hypertension - Continue amlodipine and clonidine patch. Hypertensive emergency - present on admission, resolved.  Type 2 diabetes mellitus with hypoglycemic episode: Lantus discontinued since enteral/NGT nutrition has been discontinued.  Continue NovoLog as needed.  Continue to monitor glucose levels closely.  ESRD/bone marrow disorder: On HD TTS.  Nephrology following for dialysis.  Severe Protein Calorie Malnutrition - NG tube placed 4/28, since removed.  Tolerating diet.  Continue Nepro supplement drinks.  Underweight - Patient BMI: Body mass index is 14.66 kg/m.   DVT prophylaxis: heparin  Diet:  Diet Orders (From admission, onward)    Start     Ordered   08/27/19 1901  DIET - DYS  1 Room service appropriate? Yes with Assist; Fluid consistency: Thin; Fluid restriction: 1200 mL Fluid  Diet effective now    Comments: Extra Gravy on meats, butter on potatoes. NO ICE IN DRINKS -- LIKES ROOM TEMP DRINKS. Puddings, Yogurt TID meals. Soups at lunch/dinner.  Question  Answer Comment  Room service appropriate? Yes with Assist   Fluid consistency: Thin   Fluid restriction: 1200 mL Fluid      08/27/19 1901            Code Status: Full Code    Subjective 08/28/19    Patient seen during dialysis.  No acute events reported.  She is sleeping but awakes to verbal and physical stimulus.  She denies fever/chills, pain or other complaints.   Disposition Plan & Communication   Status is: Inpatient  Remains inpatient appropriate because:she requires SNF placement and is unable to safely return home.  However, patient is undocumented and uninsured so SNF is not possible.  Charity home health service options are being explored.  Patient had significant drop in Hbg today and this is currently being evaluated   Dispo: The patient is from: Home              Anticipated d/c is to: Home with home health services              Anticipated d/c date is: > 3 days              Patient currently NOT medically stable for discharge   Family Communication: none at bedside, will attempt to call   Consults, Procedures, Significant Events   Consultants:   Nephrology  Procedures:   Dialysis     Objective   Vitals:   08/28/19 1130 08/28/19 1145 08/28/19 1200 08/28/19 1249  BP: (!) 170/64 (!) 173/66 (!) 180/68 (!) 171/67  Pulse: 88 91 89 91  Resp: 18 18 18 16   Temp:   98.8 F (37.1 C) 98.9 F (37.2 C)  TempSrc:   Oral Oral  SpO2: 98% 99% 97% 98%  Weight:      Height:        Intake/Output Summary (Last 24 hours) at 08/28/2019 1637 Last data filed at 08/28/2019 1300 Gross per 24 hour  Intake 360 ml  Output 1023 ml  Net -663 ml   Filed Weights   08/26/19 0422 08/27/19 0416 08/28/19 0351  Weight: 31.9 kg 33 kg 32.9 kg    Physical Exam:  General exam: sleeping in dialysis chair, no acute distress, underweight, frail appearing Respiratory system: CTAB, no wheezes, rales or rhonchi, normal respiratory effort. Cardiovascular system: normal S1/S2,  RRR, no pedal edema.   Gastrointestinal system: soft, non-tender, nondistended Extremities: moves all, no cyanosis, normal tone  Labs   Data Reviewed: I have personally reviewed following labs and imaging studies  CBC: Recent Labs  Lab 08/23/19 0417 08/28/19 0535 08/28/19 1554  WBC 9.6 6.7  --   HGB 10.2* 7.2* 7.4*  HCT 32.1* 22.6* 23.0*  MCV 83.8 84.6  --   PLT 204 288  --    Basic Metabolic Panel: Recent Labs  Lab 08/22/19 0423 08/23/19 0417 08/26/19 0618 08/26/19 0935  NA 139 141  --  137  K 4.5 4.5  --  3.9  CL 97* 97*  --  97*  CO2 32 32  --  31  GLUCOSE 267* 177* 149* 169*  BUN 36* 66*  --  46*  CREATININE 3.23* 4.84*  --  3.51*  CALCIUM 7.9*  7.7*  --  7.5*  MG 2.0 2.4  --   --   PHOS 4.3 5.6*  --  3.7   GFR: Estimated Creatinine Clearance: 7.7 mL/min (A) (by C-G formula based on SCr of 3.51 mg/dL (H)). Liver Function Tests: Recent Labs  Lab 08/26/19 0935  ALBUMIN 2.4*   No results for input(s): LIPASE, AMYLASE in the last 168 hours. Recent Labs  Lab 08/22/19 0423 08/23/19 0417 08/24/19 0403  AMMONIA 32 20 11   Coagulation Profile: No results for input(s): INR, PROTIME in the last 168 hours. Cardiac Enzymes: No results for input(s): CKTOTAL, CKMB, CKMBINDEX, TROPONINI in the last 168 hours. BNP (last 3 results) No results for input(s): PROBNP in the last 8760 hours. HbA1C: No results for input(s): HGBA1C in the last 72 hours. CBG: Recent Labs  Lab 08/28/19 0357 08/28/19 0535 08/28/19 0749 08/28/19 1248 08/28/19 1547  GLUCAP 100* 112* 113* 107* 256*   Lipid Profile: No results for input(s): CHOL, HDL, LDLCALC, TRIG, CHOLHDL, LDLDIRECT in the last 72 hours. Thyroid Function Tests: No results for input(s): TSH, T4TOTAL, FREET4, T3FREE, THYROIDAB in the last 72 hours. Anemia Panel: No results for input(s): VITAMINB12, FOLATE, FERRITIN, TIBC, IRON, RETICCTPCT in the last 72 hours. Sepsis Labs: No results for input(s): PROCALCITON,  LATICACIDVEN in the last 168 hours.  Recent Results (from the past 240 hour(s))  MRSA PCR Screening     Status: None   Collection Time: 08/26/19  4:00 AM   Specimen: Nasal Mucosa; Nasopharyngeal  Result Value Ref Range Status   MRSA by PCR NEGATIVE NEGATIVE Final    Comment:        The GeneXpert MRSA Assay (FDA approved for NASAL specimens only), is one component of a comprehensive MRSA colonization surveillance program. It is not intended to diagnose MRSA infection nor to guide or monitor treatment for MRSA infections. Performed at [facility], [address], [place], [place] [address]       Imaging Studies   No results found.   Medications   Scheduled Meds: . amLODipine  5 mg Oral Daily  . calcitRIOL  0.25 mcg Oral Daily  . Chlorhexidine Gluconate Cloth  6 each Topical Daily  . cloNIDine  0.2 mg Transdermal Weekly  . epoetin (EPOGEN/PROCRIT) injection  4,000 Units Intravenous Q T,Th,Sa-HD  . feeding supplement (NEPRO CARB STEADY)  237 mL Oral BID BM  . furosemide  80 mg Oral QODAY  . heparin  5,000 Units Subcutaneous Q12H  . insulin aspart  0-6 Units Subcutaneous Q4H  . multivitamin  1 tablet Oral QHS   Continuous Infusions:     LOS: 12 days    Time spent: 40 minutes with >50% spent in coordination of care and direct patient contact.    Ezekiel Slocumb, DO [facility]  08/28/2019, 4:37 PM    If 7PM-7AM, please contact night-coverage. How to contact the [facility] Attending or Consulting provider [place] or covering provider during after hours [place], for this patient?    1. Check the care team in [facility] and look for a) attending/consulting TRH provider listed and b) the [facility] team listed 2. Log into [email or web address] and use [facility]'s universal password to access. If you do not have the password, please contact the hospital operator. 3. Locate the [facility] provider you are looking for under [facility] and page to a number that you can be  directly reached. 4. If you still have difficulty reaching the provider, please page the [facility] (  Director on Call) for the Hospitalists listed on amion for assistance.

## 2019-08-28 NOTE — Progress Notes (Signed)
SLP F/U Note  Patient Details Name: Porchea Charrier MRN: [id number] DOB: July 27, 1950   Cancelled treatment:       Reason Eval/Treat Not Completed: (chart reviewed; NSG consulted). NSG reported good toleration of current Pureed diet w/ thin liquids - pt is Edentulous, and this diet consistency is recommended until pt can be back home w/ Family who can cook/soften foods pt is used to for easier gumming and mashing(w/ her baseline Edentulous status). Recommend continue general aspiration precautions and Pills given in Puree for easier, safer swallowing and Feeding assistance at all meals.  NSG to reconsult ST services if any new needs arise during admission. NSG agreed.     Orinda Kenner, [place], CCC-SLP Mayes Sangiovanni 08/28/2019, 5:23 PM

## 2019-08-28 NOTE — Progress Notes (Signed)
[facility]  ROUNDING NOTE   Subjective:     HEMODIALYSIS FLOWSHEET:  Blood Flow Rate (mL/min): 300 mL/min Arterial Pressure (mmHg): -100 mmHg Venous Pressure (mmHg): 100 mmHg Transmembrane Pressure (mmHg): 40 mmHg Ultrafiltration Rate (mL/min): 160 mL/min Dialysate Flow Rate (mL/min): 600 ml/min Conductivity: Machine : 14 Conductivity: Machine : 14 Dialysis Fluid Bolus: Normal Saline Bolus Amount (mL): 250 mL  Seen during dialysis Tolerating well No acute complaints No major events reported by nursing   Objective:  Vital signs in last 24 hours:  Temp:  [98.2 F (36.8 C)-98.8 F (37.1 C)] (P) 98.2 F (36.8 C) (05/06 0840) Pulse Rate:  [83-87] 87 (05/06 0336) Resp:  [16-20] (P) 18 (05/06 0840) BP: (142-148)/(59-60) 148/60 (05/06 0336) SpO2:  [97 %] 97 % (05/06 0336) Weight:  [32.9 kg] 32.9 kg (05/06 0351)  Weight change: -0.045 kg Filed Weights   08/26/19 0422 08/27/19 0416 08/28/19 0351  Weight: 31.9 kg 33 kg 32.9 kg    Intake/Output: I/O last 3 completed shifts: In: 120 [P.O.:120] Out: -    Intake/Output this shift:  No intake/output data recorded.  Physical Exam: General: NAD, laying in bed  Head: Normocephalic, atraumatic. Moist oral mucosal membranes  Eyes: Anicteric,   Lungs:  Clear to auscultation  Heart: Regular rate and rhythm  Abdomen:  Soft, nontender  Extremities:  no peripheral edema.  Neurologic:  Resting quietly  Skin: No lesions  Access: Right IJ PermCath    Basic Metabolic Panel: Recent Labs  Lab 08/21/19 1400 08/21/19 1400 08/22/19 0423 08/23/19 0417 08/26/19 0618 08/26/19 0935  NA 145  --  139 141  --  137  K 3.5  --  4.5 4.5  --  3.9  CL 100  --  97* 97*  --  97*  CO2 27  --  32 32  --  31  GLUCOSE 185*  --  267* 177* 149* 169*  BUN 93*  --  36* 66*  --  46*  CREATININE 6.81*  --  3.23* 4.84*  --  3.51*  CALCIUM 7.8*   < > 7.9* 7.7*  --  7.5*  MG  --   --  2.0 2.4  --   --   PHOS 7.3*  --  4.3 5.6*  --   3.7   < > = values in this interval not displayed.    Liver Function Tests: Recent Labs  Lab 08/21/19 1400 08/26/19 0935  ALBUMIN 3.0* 2.4*   No results for input(s): LIPASE, AMYLASE in the last 168 hours. Recent Labs  Lab 08/22/19 0423 08/23/19 0417 08/24/19 0403  AMMONIA 32 20 11    CBC: Recent Labs  Lab 08/21/19 1400 08/23/19 0417 08/28/19 0535  WBC 8.2 9.6 6.7  HGB 10.7* 10.2* 7.2*  HCT 32.7* 32.1* 22.6*  MCV 82.2 83.8 84.6  PLT 219 204 288    Cardiac Enzymes: No results for input(s): CKTOTAL, CKMB, CKMBINDEX, TROPONINI in the last 168 hours.  BNP: Invalid input(s): POCBNP  CBG: Recent Labs  Lab 08/28/19 0330 08/28/19 0337 08/28/19 0357 08/28/19 0535 08/28/19 0749  GLUCAP 49* 52* 100* 112* 113*    Microbiology: Results for orders placed or performed during the hospital encounter of 08/16/19  Urine culture     Status: None   Collection Time: 08/16/19  8:23 PM   Specimen: In/Out Cath Urine  Result Value Ref Range Status   Specimen Description   Final    IN/OUT CATH URINE Performed at [facility], [address], [place], [place] [address]    Special Requests   Final    NONE Performed at [facility], [address], [place], [place] [address]    Culture   Final    NO GROWTH Performed at [facility], [place] [address] [place], [place] [address]    Report Status 08/18/2019 FINAL  Final  Blood Culture (routine x 2)     Status: None   Collection Time: 08/16/19  8:29 PM   Specimen: BLOOD  Result Value Ref Range Status   Specimen Description BLOOD BLOOD LEFT FOREARM  Final   Special Requests   Final    BOTTLES DRAWN AEROBIC AND ANAEROBIC Blood Culture adequate volume   Culture   Final    NO GROWTH 5 DAYS Performed at [facility], [address] [place], [place] [address]    Report Status 08/21/2019 FINAL  Final  Blood Culture (routine x 2)     Status: None   Collection Time: 08/16/19  8:29 PM    Specimen: BLOOD  Result Value Ref Range Status   Specimen Description BLOOD BLOOD LEFT ARM  Final   Special Requests   Final    BOTTLES DRAWN AEROBIC AND ANAEROBIC Blood Culture results may not be optimal due to an inadequate volume of blood received in culture bottles   Culture   Final    NO GROWTH 5 DAYS Performed at [facility], [address] [place], [place] [address]    Report Status 08/21/2019 FINAL  Final  Respiratory Panel by RT PCR (Flu A&B, Covid) - Urine, Clean Catch     Status: None   Collection Time: 08/16/19  8:47 PM   Specimen: Urine, Clean Catch  Result Value Ref Range Status   SARS Coronavirus 2 by RT PCR NEGATIVE NEGATIVE Final    Comment: (NOTE) SARS-CoV-2 target nucleic acids are NOT DETECTED. The SARS-CoV-2 RNA is generally detectable in upper respiratoy specimens during the acute phase of infection. The lowest concentration of SARS-CoV-2 viral copies this assay can detect is 131 copies/mL. A negative result does not preclude SARS-Cov-2 infection and should not be used as the sole basis for treatment or other patient management decisions. A negative result may occur with  improper specimen collection/handling, submission of specimen other than nasopharyngeal swab, presence of viral mutation(s) within the areas targeted by this assay, and inadequate number of viral copies (<131 copies/mL). A negative result must be combined with clinical observations, patient history, and epidemiological information. The expected result is Negative. Fact Sheet for Patients:  PinkCheek.be Fact Sheet for Healthcare Providers:  GravelBags.it This test is not yet ap proved or cleared by the [place] FDA and  has been authorized for detection and/or diagnosis of SARS-CoV-2 by FDA under an Emergency Use Authorization (EUA). This EUA will remain  in effect (meaning this test can be used) for the duration of  the COVID-19 declaration under Section 564(b)(1) of the Act, 21 U.S.C. section 360bbb-3(b)(1), unless the authorization is terminated or revoked sooner.    Influenza A by PCR NEGATIVE NEGATIVE Final   Influenza B by PCR NEGATIVE NEGATIVE Final    Comment: (NOTE) The Xpert Xpress SARS-CoV-2/FLU/RSV assay is intended as an aid in  the diagnosis of influenza from Nasopharyngeal swab specimens and  should not be used as a sole basis for treatment. Nasal washings and  aspirates are unacceptable for Xpert Xpress SARS-CoV-2/FLU/RSV  testing. Fact Sheet for Patients: PinkCheek.be Fact Sheet for Healthcare Providers:  GravelBags.it This test is not yet approved or cleared by the [place] and  has been authorized for detection and/or diagnosis of SARS-CoV-2 by  FDA under an Emergency Use Authorization (EUA). This EUA will remain  in effect (meaning this test can be used) for the duration of the  Covid-19 declaration under Section 564(b)(1) of the Act, 21  U.S.C. section 360bbb-3(b)(1), unless the authorization is  terminated or revoked. Performed at [facility], [address] [place], [place] [address]   MRSA PCR Screening     Status: None   Collection Time: 08/26/19  4:00 AM   Specimen: Nasal Mucosa; Nasopharyngeal  Result Value Ref Range Status   MRSA by PCR NEGATIVE NEGATIVE Final    Comment:        The GeneXpert MRSA Assay (FDA approved for NASAL specimens only), is one component of a comprehensive MRSA colonization surveillance program. It is not intended to diagnose MRSA infection nor to guide or monitor treatment for MRSA infections. Performed at [facility], [address] [place], [place] [address]     Coagulation Studies: No results for input(s): LABPROT, INR in the last 72 hours.  Urinalysis: No results for input(s): COLORURINE, LABSPEC, PHURINE, GLUCOSEU, HGBUR, BILIRUBINUR,  KETONESUR, PROTEINUR, UROBILINOGEN, NITRITE, LEUKOCYTESUR in the last 72 hours.  Invalid input(s): APPERANCEUR    Imaging: No results found.   Medications:    . amLODipine  5 mg Oral Daily  . calcitRIOL  0.25 mcg Oral Daily  . Chlorhexidine Gluconate Cloth  6 each Topical Daily  . cloNIDine  0.2 mg Transdermal Weekly  . epoetin (EPOGEN/PROCRIT) injection  4,000 Units Intravenous Q T,Th,Sa-HD  . feeding supplement (NEPRO CARB STEADY)  237 mL Oral BID BM  . furosemide  80 mg Oral QODAY  . heparin  5,000 Units Subcutaneous Q12H  . insulin aspart  0-6 Units Subcutaneous Q4H  . multivitamin  1 tablet Oral QHS   acetaminophen **OR** acetaminophen, hydrALAZINE, ipratropium-albuterol, labetalol, loperamide, magnesium hydroxide, ondansetron **OR** ondansetron (ZOFRAN) IV  Assessment/ Plan:  [name] is a 69 y.o. Hispanic female with end stage renal disease on hemodialysis, diabetes mellitus, hypertension, who was admitted 08/16/2019 for Encephalopathy [G93.40] Sepsis ([place]) [A41.9] Fever, unspecified fever cause [R50.9] Altered mental status, unspecified altered mental status type [R41.82]  [facility] ([place]) [facility] TTS Right AVF  1. End stage renal disease: TTS schedule.  Patient has moved to [facility] and will be transitioning to [place] and will be followed by [facility] Patient seen during dialysis Tolerating well    2. Hypertension: Home regimen of amlodipine and furosemide. PRN hydralazine BP Readings from Last 3 Encounters:  08/28/19 (!) 148/60  08/14/19 (!) 161/100  07/30/19 (!) 185/79    3. Anemia of chronic kidney disease:   - EPO with HD treatment for hgb <11.  Lab Results  Component Value Date   HGB 7.2 (L) 08/28/2019     4. Secondary Hyperparathyroidism of renal origin:    Lab Results  Component Value Date   PTH 269 (H) 03/22/2019   PTH Comment 03/22/2019   CALCIUM 7.5 (L) 08/26/2019    CAION 0.90 (L) 06/09/2019   PHOS 3.7 08/26/2019  Start oral calcitriol while in the hospital    5. Encephalopathy:  Evaluated by neurologist 4/30 Depressed mental status is felt to be secondary to metabolic encephalopathy MRI brain no acute changes EEG consistent with severe metabolic encephalopathy Improved and appears to be back to  baseline    LOS: 12 Myrick Mcnairy 5/6/202112:24 PM

## 2019-08-28 NOTE — Progress Notes (Signed)
Patient transported to HD 

## 2019-08-29 ENCOUNTER — Inpatient Hospital Stay: Payer: Medicaid Other

## 2019-08-29 LAB — BASIC METABOLIC PANEL
Anion gap: 9 (ref 5–15)
BUN: 33 mg/dL — ABNORMAL HIGH (ref 8–23)
CO2: 33 mmol/L — ABNORMAL HIGH (ref 22–32)
Calcium: 7.8 mg/dL — ABNORMAL LOW (ref 8.9–10.3)
Chloride: 94 mmol/L — ABNORMAL LOW (ref 98–111)
Creatinine, Ser: 2.8 mg/dL — ABNORMAL HIGH (ref 0.44–1.00)
GFR calc Af Amer: 19 mL/min — ABNORMAL LOW (ref >60)
GFR calc non Af Amer: 16 mL/min — ABNORMAL LOW (ref >60)
Glucose, Bld: 104 mg/dL — ABNORMAL HIGH (ref 70–99)
Potassium: 4.7 mmol/L (ref 3.5–5.1)
Sodium: 136 mmol/L (ref 135–145)

## 2019-08-29 LAB — GLUCOSE, CAPILLARY
Glucose-Capillary: 101 mg/dL — ABNORMAL HIGH (ref 70–99)
Glucose-Capillary: 108 mg/dL — ABNORMAL HIGH (ref 70–99)
Glucose-Capillary: 113 mg/dL — ABNORMAL HIGH (ref 70–99)
Glucose-Capillary: 159 mg/dL — ABNORMAL HIGH (ref 70–99)
Glucose-Capillary: 180 mg/dL — ABNORMAL HIGH (ref 70–99)
Glucose-Capillary: 222 mg/dL — ABNORMAL HIGH (ref 70–99)
Glucose-Capillary: 229 mg/dL — ABNORMAL HIGH (ref 70–99)
Glucose-Capillary: 48 mg/dL — ABNORMAL LOW (ref 70–99)
Glucose-Capillary: 89 mg/dL (ref 70–99)

## 2019-08-29 LAB — PROCALCITONIN: Procalcitonin: 0.3 ng/mL

## 2019-08-29 LAB — CBC
HCT: 22.9 % — ABNORMAL LOW (ref 36.0–46.0)
Hemoglobin: 7.3 g/dL — ABNORMAL LOW (ref 12.0–15.0)
MCH: 27.1 pg (ref 26.0–34.0)
MCHC: 31.9 g/dL (ref 30.0–36.0)
MCV: 85.1 fL (ref 80.0–100.0)
Platelets: 277 10*3/uL (ref 150–400)
RBC: 2.69 MIL/uL — ABNORMAL LOW (ref 3.87–5.11)
RDW: 17.1 % — ABNORMAL HIGH (ref 11.5–15.5)
WBC: 8.3 10*3/uL (ref 4.0–10.5)
nRBC: 0.5 % — ABNORMAL HIGH (ref 0.0–0.2)

## 2019-08-29 MED ORDER — INSULIN ASPART 100 UNIT/ML ~~LOC~~ SOLN
0.0000 [IU] | SUBCUTANEOUS | Status: DC
  Administered 2019-08-29: 1 [IU] via SUBCUTANEOUS
  Administered 2019-08-30: 2 [IU] via SUBCUTANEOUS
  Administered 2019-09-01 – 2019-09-02 (×3): 1 [IU] via SUBCUTANEOUS
  Administered 2019-09-02: 2 [IU] via SUBCUTANEOUS
  Administered 2019-09-03: 08:00:00 1 [IU] via SUBCUTANEOUS
  Administered 2019-09-03: 2 [IU] via SUBCUTANEOUS
  Filled 2019-08-29 (×8): qty 1

## 2019-08-29 MED ORDER — INSULIN ASPART 100 UNIT/ML ~~LOC~~ SOLN: 0.0000 [IU] | SUBCUTANEOUS | Status: DC

## 2019-08-29 NOTE — Progress Notes (Signed)
Occupational Therapy Treatment Patient Details Name: Amber Dixon MRN: [id number] DOB: 03-Feb-1951 Today's Date: 08/29/2019    History of present illness 69 y.o. Hispanic female with a known history of type 2 diabetes mellitus, hypertension and chronic kidney disease, who presented to the emergency room with acute onset of altered mental status with confusion and decreased responsiveness with associated elevated blood pressure.  She was admitted at [facility] recently with similar.     OT comments  Pt seen for OT tx this date to f/u re: ADL transfers/self care ADLs. Pt very fatigued/lethargic-appearing throughout session. Interpreter phone utilized for communication [place], ID# [phone]). Pt performed sup to sit with MAX A and demos F static sitting balance at EOB for ~6 mins. OT attempts to engage pt in ADLs at this time, but pt struggles d/t fatigue and weakness. Pt requires MAX A with LB dressing in sitting to thread socks, and is near total dependence for SPS t/f from EOB to [facility]. Pt continues to require extensive assistance, but is more open to participation based on today's efforts. Will continue to follow. SNF remains most prudent d/c recommendation.   Follow Up Recommendations  SNF;Supervision/Assistance - 24 hour    Equipment Recommendations  Tub/shower seat;3 in 1 bedside commode    Recommendations for Other Services      Precautions / Restrictions Precautions Precautions: Fall Restrictions Weight Bearing Restrictions: No       Mobility Bed Mobility Overal bed mobility: Needs Assistance Bed Mobility: Supine to Sit;Sit to Supine     Supine to sit: Max assist Sit to supine: Max assist   General bed mobility comments: Despite repeated cuing (via interp) and extra time and cuing to encourage her to roll and work toward EOB she ultimately needed heavy assist to initiate and maintain effort of getting from/to supine  Transfers Overall transfer level: Needs  assistance Equipment used: Rolling walker (2 wheeled) Transfers: Stand Pivot Transfers Stand pivot transfers: Total assist(attempted unsuccessfully to SPS to [facility], pt becomes nauseated, RN notified.)          Balance Overall balance assessment: Needs assistance Sitting-balance support: Feet unsupported;Single extremity supported Sitting balance-Leahy Scale: Fair Sitting balance - Comments: requires at least 1 UE support.   Standing balance support: Bilateral upper extremity supported Standing balance-Leahy Scale: Poor                         ADL either performed or assessed with clinical judgement   ADL Overall ADL's : Needs assistance/impaired                     Lower Body Dressing: Maximal assistance;Sitting/lateral leans Lower Body Dressing Details (indicate cue type and reason): to thread socks in EOB sitting. Toilet Transfer: Total assistance Toilet Transfer Details (indicate cue type and reason): attempt arm in arm SPS transfer from EOB to [facility], but pt is nearly non-participatory/limp with t/f attempt and OT assists her back to EOB sitting.                 Vision   Additional Comments: difficult to formally assess, appears to mostly track okay when eyes open, very lethargic   Perception     Praxis      Cognition Arousal/Alertness: Lethargic Behavior During Therapy: Flat affect Overall Cognitive Status: No family/caregiver present to determine baseline cognitive functioning  General Comments: Generally unengaged but did converse via interp most of the time.         Exercises     Shoulder Instructions       General Comments tele interpreter utilized t/o the session: [place] [phone]    Pertinent Vitals/ Pain       Pain Assessment: (c/o vague head ache but no overt pain) Pain Location: doesn't report pain, but does report upset stomach and grimaces, does not quantify  Home Living                                           Prior Functioning/Environment              Frequency  Min 2X/week        Progress Toward Goals  OT Goals(current goals can now be found in the care plan section)  Progress towards OT goals: OT to reassess next treatment  Acute Rehab OT Goals Patient Stated Goal: Go home OT Goal Formulation: With patient Time For Goal Achievement: 09/10/19 Potential to Achieve Goals: Fair  Plan      Co-evaluation                 AM-PAC OT "6 Clicks" Daily Activity     Outcome Measure   Help from another person eating meals?: A Lot Help from another person taking care of personal grooming?: A Lot Help from another person toileting, which includes using toliet, bedpan, or urinal?: Total Help from another person bathing (including washing, rinsing, drying)?: A Lot Help from another person to put on and taking off regular upper body clothing?: A Lot Help from another person to put on and taking off regular lower body clothing?: Total 6 Click Score: 10    End of Session Equipment Utilized During Treatment: Gait belt  OT Visit Diagnosis: Other abnormalities of gait and mobility (R26.89);Muscle weakness (generalized) (M62.81)   Activity Tolerance Patient limited by fatigue   Patient Left in bed;with call bell/phone within reach;with bed alarm set   Nurse Communication          Time: 2841-3244 OT Time Calculation (min): 17 min  Charges: OT General Charges $OT Visit: 1 Visit OT Treatments $Self Care/Home Management : 8-22 mins  Gerrianne Scale, [place], OTR/L ascom [phone] 08/29/19, 4:08 PM

## 2019-08-29 NOTE — Progress Notes (Addendum)
PROGRESS NOTE    Amber Dixon   QQV:[id number]  DOB: 07-Mar-1951  PCP: Patient, No Pcp Per    DOA: 08/16/2019 LOS: 13   Brief Narrative   Medical records reviewed and are as summarized below:   Amber Dixon  is a 69 y.o. Hispanic female with a known history of type 2 diabetes mellitus, hypertension and chronic kidney disease, who presented to the emergency room with headache, dizziness, significantly elevated blood pressure and acute onset of altered mental status with confusion and decreased responsiveness on 08/16/2019.  The patient was seen in [facility] on 4/22 for elevated blood pressure and similar confusion after hemodialysis which has resolved with improved blood pressure.   On admission, her blood pressure was 216/111, she was febrile with temperature 102.2 and tachycardic with heart rate in the 120s.  She was admitted to the hospital for acute encephalopathy and probable sepsis.  She was found lethargic and minimally responsive.  She was treated with empiric IV antibiotics.  She was seen in consultation by the nephrologist and she underwent hemodialysis.  Her blood pressure has slowly improved.  She was also seen by the neurologist because of acute encephalopathy.  CT head and MRI brain did not show any acute abnormality.  Blood cultures were negative and there was no evidence of infection so antibiotics was discontinued.  Sepsis was ruled out.  Encephalopathy was thought to be due to metabolic encephalopathy versus hypertensive encephalopathy.   Encephalopathy slowly improved.  She required enteral nutrition via nasogastric tube.  Mental status is back to baseline and she has been able to tolerate a regular diet.  She developed nausea, abdominal cramps and diarrhea.    Assessment & Plan   Principal Problem:   Encephalopathy Active Problems:   ESRD (end stage renal disease) on dialysis ([place])   Altered mental status   DNR (do not  resuscitate) discussion   Palliative care by specialist   Protein-calorie malnutrition, severe  Fever - patient had 100.32F temp around 8pm last night.  Low grade 1F this AM. --Chest xray, UA, blood cultures pending for infection work-up --monitor closely for signs of infection  Acute anemia superimposed on Anemia of chronic disease: Hbg dropped from 10.0 on 5/1 to 7.2 on 5/6.  No reports of bleeding.  FOBT is negative.  Patient gets Epo with dialysis as needed.   --repeat H&H this afternoon, CBC in AM --transfuse if Hbg < 7.0  SIRS: No clear source of infection identified.  Antibiotics have been discontinued and patient remains without signs of infection.  Nausea, watery diarrhea and abdominal cramps: resolved.  Possibly was due to NG tube feeds.  Imodium as needed.  Doubt C. difficile infection at this time but will continue to monitor. Encouraged adequate oral hydration.   Acute metabolic encephalopathy: Improved.  It appears patient is at her baseline.  Work-up including CT head, MRI brain, TFT, ammonia and cultures unrevealing.  Suspect this was hypertensive encephalopathy.      Hypertension - Continue amlodipine and clonidine patch. Hypertensive emergency - present on admission, resolved.  Type 2 diabetes mellitus with hypoglycemic episodes: Lantus discontinued since enteral/NGT nutrition has been discontinued.  Continue NovoLog by custom sliding scale as needed (scale changed due to hypoglycemic episodes overnight).  Continue to monitor glucose levels closely.  Hypoglycemia protocol.  ESRD/bone marrow disorder: On HD TTS.  Nephrology following for dialysis.  Severe Protein Calorie Malnutrition - NG tube placed 4/28, since removed.  Tolerating diet.  Continue Nepro supplement drinks.  Underweight - Patient BMI: Body mass index is 15.27 kg/m.   DVT prophylaxis: heparin  Diet:  Diet Orders (From admission, onward)    Start     Ordered   08/27/19 1901  DIET - DYS 1 Room  service appropriate? Yes with Assist; Fluid consistency: Thin; Fluid restriction: 1200 mL Fluid  Diet effective now    Comments: Extra Gravy on meats, butter on potatoes. NO ICE IN DRINKS -- LIKES ROOM TEMP DRINKS. Puddings, Yogurt TID meals. Soups at lunch/dinner.  Question Answer Comment  Room service appropriate? Yes with Assist   Fluid consistency: Thin   Fluid restriction: 1200 mL Fluid      08/27/19 1901            Code Status: Full Code    Subjective 08/29/19    Patient seen at bedside.  No acute events, but patient had fever 100.7 last night.  Patient said she is cold.  Has intermittent cough.  Denies pain or other complaints.   Disposition Plan & Communication   Status is: Inpatient  Remains inpatient appropriate because:SNF recommended, however, patient is undocumented and uninsured so SNF is not possible.  [facility] home health service options are being explored.  Medically, patient had significant drop in Hbg on 5/6 and also a recurrence of fever last night, both of these issues under evaluation.   Dispo: The patient is from: Home              Anticipated d/c is to: Home with home health services              Anticipated d/c date is: > 3 days              Patient currently NOT medically stable for discharge   Family Communication: none at bedside, will attempt to call   Consults, Procedures, Significant Events   Consultants:   Nephrology  Procedures:   Dialysis     Objective   Vitals:   08/28/19 2010 08/29/19 0343 08/29/19 0415 08/29/19 1324  BP: (!) 154/69 (!) 163/67  (!) 152/67  Pulse: 92 88  91  Resp: 16 20  18   Temp: (!) 100.7 F (38.2 C) 99.4 F (37.4 C)  98.7 F (37.1 C)  TempSrc: Oral Oral  Oral  SpO2: 97% 96%  97%  Weight:   34.3 kg   Height:        Intake/Output Summary (Last 24 hours) at 08/29/2019 1357 Last data filed at 08/29/2019 0830 Gross per 24 hour  Intake 240 ml  Output --  Net 240 ml   Filed Weights   08/27/19 0416  08/28/19 0351 08/29/19 0415  Weight: 33 kg 32.9 kg 34.3 kg    Physical Exam:  General exam: sleeping in dialysis chair, no acute distress, underweight, frail appearing Respiratory system: CTAB, no wheezes, rales or rhonchi, normal respiratory effort. Cardiovascular system: normal S1/S2, RRR, no pedal edema.   Gastrointestinal system: soft, non-tender, nondistended Extremities: moves all, no cyanosis, normal tone  Labs   Data Reviewed: I have personally reviewed following labs and imaging studies  CBC: Recent Labs  Lab 08/23/19 0417 08/28/19 0535 08/28/19 1554 08/29/19 0602  WBC 9.6 6.7  --  8.3  HGB 10.2* 7.2* 7.4* 7.3*  HCT 32.1* 22.6* 23.0* 22.9*  MCV 83.8 84.6  --  85.1  PLT 204 288  --  562   Basic Metabolic Panel: Recent Labs  Lab 08/23/19 0417 08/26/19 0618 08/26/19 0935 08/29/19  0602  NA 141  --  137 136  K 4.5  --  3.9 4.7  CL 97*  --  97* 94*  CO2 32  --  31 33*  GLUCOSE 177* 149* 169* 104*  BUN 66*  --  46* 33*  CREATININE 4.84*  --  3.51* 2.80*  CALCIUM 7.7*  --  7.5* 7.8*  MG 2.4  --   --   --   PHOS 5.6*  --  3.7  --    GFR: Estimated Creatinine Clearance: 10.1 mL/min (A) (by C-G formula based on SCr of 2.8 mg/dL (H)). Liver Function Tests: Recent Labs  Lab 08/26/19 0935  ALBUMIN 2.4*   No results for input(s): LIPASE, AMYLASE in the last 168 hours. Recent Labs  Lab 08/23/19 0417 08/24/19 0403  AMMONIA 20 11   Coagulation Profile: No results for input(s): INR, PROTIME in the last 168 hours. Cardiac Enzymes: No results for input(s): CKTOTAL, CKMB, CKMBINDEX, TROPONINI in the last 168 hours. BNP (last 3 results) No results for input(s): PROBNP in the last 8760 hours. HbA1C: No results for input(s): HGBA1C in the last 72 hours. CBG: Recent Labs  Lab 08/29/19 0217 08/29/19 0341 08/29/19 0404 08/29/19 0756 08/29/19 1218  GLUCAP 101* 113* 108* 89 222*   Lipid Profile: No results for input(s): CHOL, HDL, LDLCALC, TRIG, CHOLHDL,  LDLDIRECT in the last 72 hours. Thyroid Function Tests: No results for input(s): TSH, T4TOTAL, FREET4, T3FREE, THYROIDAB in the last 72 hours. Anemia Panel: No results for input(s): VITAMINB12, FOLATE, FERRITIN, TIBC, IRON, RETICCTPCT in the last 72 hours. Sepsis Labs: No results for input(s): PROCALCITON, LATICACIDVEN in the last 168 hours.  Recent Results (from the past 240 hour(s))  MRSA PCR Screening     Status: None   Collection Time: 08/26/19  4:00 AM   Specimen: Nasal Mucosa; Nasopharyngeal  Result Value Ref Range Status   MRSA by PCR NEGATIVE NEGATIVE Final    Comment:        The GeneXpert MRSA Assay (FDA approved for NASAL specimens only), is one component of a comprehensive MRSA colonization surveillance program. It is not intended to diagnose MRSA infection nor to guide or monitor treatment for MRSA infections. Performed at [facility], [address], [place], [place] [address]       Imaging Studies   DG Chest [place] 1 View  Result Date: 08/29/2019 CLINICAL DATA:  Fever. EXAM: PORTABLE CHEST 1 VIEW COMPARISON:  08/16/2019. FINDINGS: Right-sided dual-lumen catheter in unchanged position. Stable cardiomegaly. Mild bilateral interstitial prominence again noted. Component these changes may be chronic however an active interstitial process including pneumonitis and or interstitial edema cannot be excluded. No pleural effusion or pneumothorax. IMPRESSION: 1.  Right-sided dual-lumen catheter in unchanged position. 2. Stable cardiomegaly. 3 mild bilateral interstitial prominence again noted. A component these changes may be chronic however an active interstitial process including pneumonitis and or interstitial edema cannot be excluded. Similar finding noted on prior exam. Electronically Signed   By: Berkley   On: 08/29/2019 09:58     Medications   Scheduled Meds: . amLODipine  5 mg Oral Daily  . calcitRIOL  0.25 mcg Oral Daily  . Chlorhexidine Gluconate  Cloth  6 each Topical Daily  . cloNIDine  0.2 mg Transdermal Weekly  . epoetin (EPOGEN/PROCRIT) injection  4,000 Units Intravenous Q T,Th,Sa-HD  . feeding supplement (NEPRO CARB STEADY)  237 mL Oral BID BM  . furosemide  80 mg Oral QODAY  . heparin  5,000 Units Subcutaneous Q12H  . insulin aspart  0-6 Units Subcutaneous Q4H  . multivitamin  1 tablet Oral QHS   Continuous Infusions:     LOS: 13 days    Time spent: 32 minutes with > 50% spend coordinating care and in direct patient contact.    Ezekiel Slocumb, DO [facility]  08/29/2019, 1:57 PM    If 7PM-7AM, please contact night-coverage. How to contact the [facility] Attending or Consulting provider [place] or covering provider during after hours [place], for this patient?    1. Check the care team in [facility] and look for a) attending/consulting TR[facility] provider listed and b) the [facility] team listed 2. Log into [email or web address] and use [facility]'s universal password to access. If you do not have the password, please contact the hospital operator. 3. Locate the [facility] provider you are looking for under [facility] and page to a number that you can be directly reached. 4. If you still have difficulty reaching the provider, please page the [facility] (Director on Call) for the Hospitalists listed on amion for assistance.

## 2019-08-29 NOTE — Progress Notes (Signed)
[facility]  ROUNDING NOTE   Subjective:   Doing fair Son in room States he is eating some No acute complaints, shortness of breath or leg edema  Objective:  Vital signs in last 24 hours:  Temp:  [98.7 F (37.1 C)-100.7 F (38.2 C)] 98.7 F (37.1 C) (05/07 1324) Pulse Rate:  [88-92] 91 (05/07 1324) Resp:  [16-20] 18 (05/07 1324) BP: (152-163)/(67-69) 152/67 (05/07 1324) SpO2:  [96 %-97 %] 97 % (05/07 1324) Weight:  [34.3 kg] 34.3 kg (05/07 0415)  Weight change: 1.361 kg Filed Weights   08/27/19 0416 08/28/19 0351 08/29/19 0415  Weight: 33 kg 32.9 kg 34.3 kg    Intake/Output: I/O last 3 completed shifts: In: 240 [P.O.:240] Out: 1023 [Other:1023]   Intake/Output this shift:  Total I/O In: 240 [P.O.:240] Out: -   Physical Exam: General: NAD, laying in bed  Head: Normocephalic, atraumatic. Moist oral mucosal membranes  Eyes: Anicteric,   Lungs:  Clear to auscultation  Heart: Regular rate and rhythm  Abdomen:  Soft, nontender  Extremities:  no peripheral edema.  Neurologic:  Resting quietly  Skin: No lesions  Access: Right IJ PermCath    Basic Metabolic Panel: Recent Labs  Lab 08/23/19 0417 08/26/19 0618 08/26/19 0935 08/29/19 0602  NA 141  --  137 136  K 4.5  --  3.9 4.7  CL 97*  --  97* 94*  CO2 32  --  31 33*  GLUCOSE 177* 149* 169* 104*  BUN 66*  --  46* 33*  CREATININE 4.84*  --  3.51* 2.80*  CALCIUM 7.7*  --  7.5* 7.8*  MG 2.4  --   --   --   PHOS 5.6*  --  3.7  --     Liver Function Tests: Recent Labs  Lab 08/26/19 0935  ALBUMIN 2.4*   No results for input(s): LIPASE, AMYLASE in the last 168 hours. Recent Labs  Lab 08/23/19 0417 08/24/19 0403  AMMONIA 20 11    CBC: Recent Labs  Lab 08/23/19 0417 08/28/19 0535 08/28/19 1554 08/29/19 0602  WBC 9.6 6.7  --  8.3  HGB 10.2* 7.2* 7.4* 7.3*  HCT 32.1* 22.6* 23.0* 22.9*  MCV 83.8 84.6  --  85.1  PLT 204 288  --  277    Cardiac Enzymes: No results for input(s):  CKTOTAL, CKMB, CKMBINDEX, TROPONINI in the last 168 hours.  BNP: Invalid input(s): POCBNP  CBG: Recent Labs  Lab 08/29/19 0217 08/29/19 0341 08/29/19 0404 08/29/19 0756 08/29/19 1218  GLUCAP 101* 113* 108* 89 222*    Microbiology: Results for orders placed or performed during the hospital encounter of 08/16/19  Urine culture     Status: None   Collection Time: 08/16/19  8:23 PM   Specimen: In/Out Cath Urine  Result Value Ref Range Status   Specimen Description   Final    IN/OUT CATH URINE Performed at [facility], [address], [place], [place] [address]    Special Requests   Final    NONE Performed at [facility], [address], [place], [place] [address]    Culture   Final    NO GROWTH Performed at [facility], [place] [address], [place], [place] [address]    Report Status 08/18/2019 FINAL  Final  Blood Culture (routine x 2)     Status: None   Collection Time: 08/16/19  8:29 PM   Specimen: BLOOD  Result Value Ref Range Status  Specimen Description BLOOD BLOOD LEFT FOREARM  Final   Special Requests   Final    BOTTLES DRAWN AEROBIC AND ANAEROBIC Blood Culture adequate volume   Culture   Final    NO GROWTH 5 DAYS Performed at [facility], [address] [place], [place] [address]    Report Status 08/21/2019 FINAL  Final  Blood Culture (routine x 2)     Status: None   Collection Time: 08/16/19  8:29 PM   Specimen: BLOOD  Result Value Ref Range Status   Specimen Description BLOOD BLOOD LEFT ARM  Final   Special Requests   Final    BOTTLES DRAWN AEROBIC AND ANAEROBIC Blood Culture results may not be optimal due to an inadequate volume of blood received in culture bottles   Culture   Final    NO GROWTH 5 DAYS Performed at [facility], [address] [place], [place] [address]    Report Status 08/21/2019 FINAL  Final  Respiratory Panel by RT PCR (Flu A&B, Covid) - Urine, Clean Catch     Status: None    Collection Time: 08/16/19  8:47 PM   Specimen: Urine, Clean Catch  Result Value Ref Range Status   SARS Coronavirus 2 by RT PCR NEGATIVE NEGATIVE Final    Comment: (NOTE) SARS-CoV-2 target nucleic acids are NOT DETECTED. The SARS-CoV-2 RNA is generally detectable in upper respiratoy specimens during the acute phase of infection. The lowest concentration of SARS-CoV-2 viral copies this assay can detect is 131 copies/mL. A negative result does not preclude SARS-Cov-2 infection and should not be used as the sole basis for treatment or other patient management decisions. A negative result may occur with  improper specimen collection/handling, submission of specimen other than nasopharyngeal swab, presence of viral mutation(s) within the areas targeted by this assay, and inadequate number of viral copies (<131 copies/mL). A negative result must be combined with clinical observations, patient history, and epidemiological information. The expected result is Negative. Fact Sheet for Patients:  PinkCheek.be Fact Sheet for Healthcare Providers:  GravelBags.it This test is not yet ap proved or cleared by the [place] FDA and  has been authorized for detection and/or diagnosis of SARS-CoV-2 by FDA under an Emergency Use Authorization (EUA). This EUA will remain  in effect (meaning this test can be used) for the duration of the COVID-19 declaration under Section 564(b)(1) of the Act, 21 U.S.C. section 360bbb-3(b)(1), unless the authorization is terminated or revoked sooner.    Influenza A by PCR NEGATIVE NEGATIVE Final   Influenza B by PCR NEGATIVE NEGATIVE Final    Comment: (NOTE) The Xpert Xpress SARS-CoV-2/FLU/RSV assay is intended as an aid in  the diagnosis of influenza from Nasopharyngeal swab specimens and  should not be used as a sole basis for treatment. Nasal washings and  aspirates are unacceptable for Xpert Xpress  SARS-CoV-2/FLU/RSV  testing. Fact Sheet for Patients: PinkCheek.be Fact Sheet for Healthcare Providers: GravelBags.it This test is not yet approved or cleared by the [place] FDA and  has been authorized for detection and/or diagnosis of SARS-CoV-2 by  FDA under an Emergency Use Authorization (EUA). This EUA will remain  in effect (meaning this test can be used) for the duration of the  Covid-19 declaration under Section 564(b)(1) of the Act, 21  U.S.C. section 360bbb-3(b)(1), unless the authorization is  terminated or revoked. Performed at [facility], [address] [place], [place] [address]   MRSA PCR Screening     Status:  None   Collection Time: 08/26/19  4:00 AM   Specimen: Nasal Mucosa; Nasopharyngeal  Result Value Ref Range Status   MRSA by PCR NEGATIVE NEGATIVE Final    Comment:        The GeneXpert MRSA Assay (FDA approved for NASAL specimens only), is one component of a comprehensive MRSA colonization surveillance program. It is not intended to diagnose MRSA infection nor to guide or monitor treatment for MRSA infections. Performed at [facility], [address] [place], [place] [address]     Coagulation Studies: No results for input(s): LABPROT, INR in the last 72 hours.  Urinalysis: No results for input(s): COLORURINE, LABSPEC, PHURINE, GLUCOSEU, HGBUR, BILIRUBINUR, KETONESUR, PROTEINUR, UROBILINOGEN, NITRITE, LEUKOCYTESUR in the last 72 hours.  Invalid input(s): APPERANCEUR    Imaging: DG Chest Port 1 View  Result Date: 08/29/2019 CLINICAL DATA:  Fever. EXAM: PORTABLE CHEST 1 VIEW COMPARISON:  08/16/2019. FINDINGS: Right-sided dual-lumen catheter in unchanged position. Stable cardiomegaly. Mild bilateral interstitial prominence again noted. Component these changes may be chronic however an active interstitial process including pneumonitis and or interstitial edema cannot  be excluded. No pleural effusion or pneumothorax. IMPRESSION: 1.  Right-sided dual-lumen catheter in unchanged position. 2. Stable cardiomegaly. 3 mild bilateral interstitial prominence again noted. A component these changes may be chronic however an active interstitial process including pneumonitis and or interstitial edema cannot be excluded. Similar finding noted on prior exam. Electronically Signed   By: [place]   On: 08/29/2019 09:58     Medications:    . amLODipine  5 mg Oral Daily  . calcitRIOL  0.25 mcg Oral Daily  . Chlorhexidine Gluconate Cloth  6 each Topical Daily  . cloNIDine  0.2 mg Transdermal Weekly  . epoetin (EPOGEN/PROCRIT) injection  4,000 Units Intravenous Q T,Th,Sa-HD  . feeding supplement (NEPRO CARB STEADY)  237 mL Oral BID BM  . furosemide  80 mg Oral QODAY  . heparin  5,000 Units Subcutaneous Q12H  . insulin aspart  0-4 Units Subcutaneous Q4H  . multivitamin  1 tablet Oral QHS   acetaminophen **OR** acetaminophen, hydrALAZINE, ipratropium-albuterol, labetalol, loperamide, magnesium hydroxide, ondansetron **OR** ondansetron (ZOFRAN) IV  Assessment/ Plan:  [name] is a 69 y.o. Hispanic female with end stage renal disease on hemodialysis, diabetes mellitus, hypertension, who was admitted 08/16/2019 for Encephalopathy [G93.40] Sepsis ([place]) [A41.9] Fever, unspecified fever cause [R50.9] Altered mental status, unspecified altered mental status type [R41.82]  [facility] ([place]) [facility] [place] [facility] TTS Right AVF  1. End stage renal disease: TTS schedule.  Patient has moved to [facility] and will be transitioning to [place] and will be followed by [facility] Next hemodialysis planned for Saturday   2. Hypertension: Home regimen of amlodipine and furosemide. PRN hydralazine BP Readings from Last 3 Encounters:  08/29/19 (!) 152/67  08/14/19 (!) 161/100  07/30/19 (!) 185/79    3. Anemia of chronic  kidney disease:   - EPO with HD treatment for hgb <11.  Lab Results  Component Value Date   HGB 7.3 (L) 08/29/2019     4. Secondary Hyperparathyroidism of renal origin:    Lab Results  Component Value Date   PTH 269 (H) 03/22/2019   PTH Comment 03/22/2019   CALCIUM 7.8 (L) 08/29/2019   CAION 0.90 (L) 06/09/2019   PHOS 3.7 08/26/2019  Start oral calcitriol while in the hospital    5. Encephalopathy:  Evaluated by neurologist 4/30 Depressed mental status is felt to be secondary  to metabolic encephalopathy MRI brain no acute changes EEG consistent with severe metabolic encephalopathy Improved and appears to be back to baseline    LOS: 13 Amber Dixon 5/7/20212:41 PM

## 2019-08-29 NOTE — TOC Progression Note (Signed)
Transition of Care [facility]) - Progression Note    Patient Details  Name: Amber Dixon MRN: [id number] Date of Birth: May 31, 1950  Transition of Care [facility]) CM/SW Contact  Beverly Sessions, RN Phone Number: 08/29/2019, 3:03 PM  Clinical Narrative:     Tommi Rumps with [facility] able to accept patient for home health under letter of guarantee.  Case discussed with [facility] director.  He has approved 2 weeks of therapy services.  3 OT visits per wee, and 3 PT visits per week.   Daughter in law notified   Expected Discharge Plan: Home/Self Care Barriers to Discharge: Continued Medical Work up  Expected Discharge Plan and Services Expected Discharge Plan: Home/Self Care       Living arrangements for the past 2 months: Single Family Home                                       Social Determinants of Health (SDOH) Interventions    Readmission Risk Interventions Readmission Risk Prevention Plan 08/18/2019  Transportation Screening Complete  Medication Review [employer]) Complete  PCP or Specialist appointment within 3-5 days of discharge Complete  HRI or [place] Complete

## 2019-08-29 NOTE — Progress Notes (Signed)
Physical Therapy Treatment Patient Details Name: Amber Dixon MRN: [id number] DOB: 1951/01/20 Today's Date: 08/29/2019    History of Present Illness 69 y.o. Hispanic female with a known history of type 2 diabetes mellitus, hypertension and chronic kidney disease, who presented to the emergency room with acute onset of altered mental status with confusion and decreased responsiveness with associated elevated blood pressure.  She was admitted at [facility] recently with similar.      PT Comments    Pt kept eyes closed t/o most of the PT session, she was able/willing to communicate with tele interpreter much of the time but needed direct assist to initiate any movement and ultimately was very self limiting t/o the session.  She did not c/o much pain, but repeatedly states she is too weak and tired to do anything.  Similar to previous sessions she states "maana" but can not muster the energy/effort to really do a lot with PT.  We did seated balance, 2 standing efforts, assisted mobility and attempted supine exercises but each required excessive cuing/encouragement and frankly we met mostly with resistance and only minimal apparent effort.  Pt admittedly was tired/fatigued but could not find much motivation to work with PT this date, unfortunately this pt will not be able to discharge to SNF and needs to be able to be more mobile to safely transition home - despite cuing along these lines pt remained difficult to encourage to participate much at all.   Follow Up Recommendations  SNF     Equipment Recommendations  Wheelchair (measurements PT)    Recommendations for Other Services       Precautions / Restrictions Precautions Precautions: Fall Restrictions Weight Bearing Restrictions: No    Mobility  Bed Mobility Overal bed mobility: Needs Assistance Bed Mobility: Supine to Sit;Sit to Supine     Supine to sit: Max assist;Mod assist Sit to supine: Max assist   General bed mobility  comments: Despite repeated cuing (via interp) and extra time and cuing to encourage her to roll and work toward EOB she ultimately needed heavy assist to initiate and maintain effort of getting from/to supine  Transfers Overall transfer level: Needs assistance Equipment used: Rolling walker (2 wheeled) Transfers: Sit to/from Stand Sit to Stand: Max assist;Mod assist         General transfer comment: 2 seperate standing bouts ~60 seconds and then ~45 seconds.  Pt able to maintain static standing with heavy UE use but did have some buckling that required assist to arrest.  Despite heavy cuing and encouragement to try some weight shifting or even small shuffle steps she was unable/unwilling.  Ambulation/Gait         Gait velocity: unable/unsafe       Stairs             Wheelchair Mobility    Modified Rankin (Stroke Patients Only)       Balance Overall balance assessment: Needs assistance Sitting-balance support: Feet unsupported;Single extremity supported Sitting balance-Leahy Scale: Fair Sitting balance - Comments: Once assisted to position pt able to maintain sitting balance relatively well   Standing balance support: Bilateral upper extremity supported Standing balance-Leahy Scale: Poor Standing balance comment: Pt unable to tolerate any "prolonged" standing, very weak, buckling in b/l LEs, inconsistent use of walker                            Cognition Arousal/Alertness: Lethargic Behavior During Therapy: Flat affect Overall Cognitive  Status: Within Functional Limits for tasks assessed                                 General Comments: Generally unengaged but did converse via interp most of the time.       Exercises Other Exercises Other Exercises: attempted some heel slides/leg extensions and hip AB/AD with PROM b/l but could not get pt to engage in any effort despite much cuing/encouragement.  Of note pt was able to lift/cross  legs to move away from PTs hand and did display limited but self initiated AROM in all planes at various points t/o     General Comments General comments (skin integrity, edema, etc.): tele interpreter utilized t/o the session: [place] [phone]      Pertinent Vitals/Pain Pain Assessment: (c/o vague head ache but no overt pain)    Home Living                      Prior Function            PT Goals (current goals can now be found in the care plan section) Progress towards PT goals: Not progressing toward goals - comment(Pt c/o being too weak and tired t/o the session)    Frequency    Min 2X/week      PT Plan Current plan remains appropriate    Co-evaluation              AM-PAC PT "6 Clicks" Mobility   Outcome Measure  Help needed turning from your back to your side while in a flat bed without using bedrails?: Total Help needed moving from lying on your back to sitting on the side of a flat bed without using bedrails?: Total Help needed moving to and from a bed to a chair (including a wheelchair)?: Total Help needed standing up from a chair using your arms (e.g., wheelchair or bedside chair)?: Total Help needed to walk in hospital room?: Total Help needed climbing 3-5 steps with a railing? : Total 6 Click Score: 6    End of Session Equipment Utilized During Treatment: Gait belt Activity Tolerance: Patient limited by lethargy;Patient limited by fatigue Patient left: in bed;with call bell/phone within reach;with nursing/sitter in room Nurse Communication: Mobility status PT Visit Diagnosis: Muscle weakness (generalized) (M62.81);Other abnormalities of gait and mobility (R26.89);Difficulty in walking, not elsewhere classified (R26.2);Pain Pain - Right/Left: Left Pain - part of body: Leg     Time: 3557-3220 PT Time Calculation (min) (ACUTE ONLY): 31 min  Charges:  $Therapeutic Activity: 23-37 mins                     Kreg Shropshire, DPT 08/29/2019, 2:17  PM

## 2019-08-29 NOTE — Progress Notes (Addendum)
Inpatient Diabetes Program Recommendations  AACE/ADA: New Consensus Statement on Inpatient Glycemic Control (2015)  Target Ranges:  Prepandial:   less than 140 mg/dL      Peak postprandial:   less than 180 mg/dL (1-2 hours)      Critically ill patients:  140 - 180 mg/dL   Results for [name] (MRN [id number]) as of 08/29/2019 08:00  Ref. Range 08/27/2019 23:21 08/28/2019 03:30 08/28/2019 03:37 08/28/2019 03:57 08/28/2019 05:35 08/28/2019 07:49 08/28/2019 12:48 08/28/2019 15:47 08/28/2019 20:08  Glucose-Capillary Latest Ref Range: 70 - 99 mg/dL 278 (H)  3 units NOVOLOG  49 (L) 52 (L) 100 (H) 112 (H) 113 (H) 107 (H) 256 (H)  3 units NOVOLOG  174 (H)  2 units NOVOLOG    Results for [name] (MRN [id number]) as of 08/29/2019 08:00  Ref. Range 08/28/2019 23:49 08/29/2019 02:17 08/29/2019 04:04  Glucose-Capillary Latest Ref Range: 70 - 99 mg/dL 48 (L) 101 (H) 108 (H)      Current Orders: Novolog 0-6 units Q4 hours    MD- Note patient having some episodes of CBGs >200--Gets Novolog SSI and then drops low.  May consider adjusting pt's Novolog SSI to the following scale to try to prevent Hypoglycemia:  CBG 199 or less- 0 units CBG 200-250 1 unit CBG 251-300- 2 units CBG 301-350- 3 units CBG 351-400- 4 units    --Will follow patient during hospitalization--  Wyn Quaker RN, MSN, CDE Diabetes Coordinator Inpatient Glycemic Control Team Team Pager: [phone] (8a-5p)

## 2019-08-30 LAB — GLUCOSE, CAPILLARY
Glucose-Capillary: 121 mg/dL — ABNORMAL HIGH (ref 70–99)
Glucose-Capillary: 100 mg/dL — ABNORMAL HIGH (ref 70–99)
Glucose-Capillary: 92 mg/dL (ref 70–99)
Glucose-Capillary: 278 mg/dL — ABNORMAL HIGH (ref 70–99)
Glucose-Capillary: 103 mg/dL — ABNORMAL HIGH (ref 70–99)

## 2019-08-30 LAB — CBC WITH DIFFERENTIAL/PLATELET
Abs Immature Granulocytes: 0.1 10*3/uL — ABNORMAL HIGH (ref 0.00–0.07)
Basophils Absolute: 0 10*3/uL (ref 0.0–0.1)
Basophils Relative: 1 %
Eosinophils Absolute: 0.2 10*3/uL (ref 0.0–0.5)
Eosinophils Relative: 3 %
HCT: 22.3 % — ABNORMAL LOW (ref 36.0–46.0)
Hemoglobin: 7.1 g/dL — ABNORMAL LOW (ref 12.0–15.0)
Immature Granulocytes: 1 %
Lymphocytes Relative: 15 %
Lymphs Abs: 1.2 10*3/uL (ref 0.7–4.0)
MCH: 27 pg (ref 26.0–34.0)
MCHC: 31.8 g/dL (ref 30.0–36.0)
MCV: 84.8 fL (ref 80.0–100.0)
Monocytes Absolute: 1.1 10*3/uL — ABNORMAL HIGH (ref 0.1–1.0)
Monocytes Relative: 14 %
Neutro Abs: 5.3 10*3/uL (ref 1.7–7.7)
Neutrophils Relative %: 66 %
Platelets: 303 10*3/uL (ref 150–400)
RBC: 2.63 MIL/uL — ABNORMAL LOW (ref 3.87–5.11)
RDW: 17.1 % — ABNORMAL HIGH (ref 11.5–15.5)
WBC: 7.9 10*3/uL (ref 4.0–10.5)
nRBC: 0.3 % — ABNORMAL HIGH (ref 0.0–0.2)

## 2019-08-30 LAB — HEMOGLOBIN AND HEMATOCRIT, BLOOD
HCT: 22 % — ABNORMAL LOW (ref 36.0–46.0)
Hemoglobin: 7.1 g/dL — ABNORMAL LOW (ref 12.0–15.0)

## 2019-08-30 NOTE — Progress Notes (Signed)
[facility]  ROUNDING NOTE   Subjective:   Interview conducted with use of online interpreter. Patient underwent dialysis treatment today. Tolerated well.  Objective:  Vital signs in last 24 hours:  Temp:  [98 F (36.7 C)-98.7 F (37.1 C)] 98 F (36.7 C) (05/08 1419) Pulse Rate:  [83-94] 91 (05/08 1419) Resp:  [13-22] 16 (05/08 1419) BP: (75-172)/(29-74) 172/68 (05/08 1419) SpO2:  [97 %-100 %] 97 % (05/08 1419) Weight:  [70.3 kg] 70.3 kg (05/08 0525)  Weight change: 36 kg Filed Weights   08/28/19 0351 08/29/19 0415 08/30/19 0525  Weight: 32.9 kg 34.3 kg 70.3 kg    Intake/Output: I/O last 3 completed shifts: In: 240 [P.O.:240] Out: -    Intake/Output this shift:  No intake/output data recorded.  Physical Exam: General: NAD, laying in bed  Head: Normocephalic, atraumatic. Moist oral mucosal membranes  Eyes: Anicteric,   Lungs:  Clear to auscultation  Heart: Regular rate and rhythm  Abdomen:  Soft, nontender  Extremities: no peripheral edema.  Neurologic: Awake, alert, conversant  Skin: No lesions  Access: Right IJ PermCath    Basic Metabolic Panel: Recent Labs  Lab 08/26/19 0618 08/26/19 0935 08/29/19 0602  NA  --  137 136  K  --  3.9 4.7  CL  --  97* 94*  CO2  --  31 33*  GLUCOSE 149* 169* 104*  BUN  --  46* 33*  CREATININE  --  3.51* 2.80*  CALCIUM  --  7.5* 7.8*  PHOS  --  3.7  --     Liver Function Tests: Recent Labs  Lab 08/26/19 0935  ALBUMIN 2.4*   No results for input(s): LIPASE, AMYLASE in the last 168 hours. Recent Labs  Lab 08/24/19 0403  AMMONIA 11    CBC: Recent Labs  Lab 08/28/19 0535 08/28/19 1554 08/29/19 0602 08/30/19 0449 08/30/19 1434  WBC 6.7  --  8.3 7.9  --   NEUTROABS  --   --   --  5.3  --   HGB 7.2* 7.4* 7.3* 7.1* 7.1*  HCT 22.6* 23.0* 22.9* 22.3* 22.0*  MCV 84.6  --  85.1 84.8  --   PLT 288  --  277 303  --     Cardiac Enzymes: No results for input(s): CKTOTAL, CKMB, CKMBINDEX,  TROPONINI in the last 168 hours.  BNP: Invalid input(s): POCBNP  CBG: Recent Labs  Lab 08/29/19 2309 08/30/19 0402 08/30/19 0830 08/30/19 1414 08/30/19 1737  GLUCAP 159* 121* 100* 9 278*    Microbiology: Results for orders placed or performed during the hospital encounter of 08/16/19  Urine culture     Status: None   Collection Time: 08/16/19  8:23 PM   Specimen: In/Out Cath Urine  Result Value Ref Range Status   Specimen Description   Final    IN/OUT CATH URINE Performed at [facility], [address], [place], [place] [address]    Special Requests   Final    NONE Performed at [facility], [address] [place], [place] [address]    Culture   Final    NO GROWTH Performed at [facility], [place] [address] [place], [place] [address]    Report Status 08/18/2019 FINAL  Final  Blood Culture (routine x 2)     Status: None   Collection Time: 08/16/19  8:29 PM   Specimen: BLOOD  Result Value Ref Range Status   Specimen Description BLOOD BLOOD LEFT FOREARM  Final  Special Requests   Final    BOTTLES DRAWN AEROBIC AND ANAEROBIC Blood Culture adequate volume   Culture   Final    NO GROWTH 5 DAYS Performed at [facility], [address] [place], [place] [address]    Report Status 08/21/2019 FINAL  Final  Blood Culture (routine x 2)     Status: None   Collection Time: 08/16/19  8:29 PM   Specimen: BLOOD  Result Value Ref Range Status   Specimen Description BLOOD BLOOD LEFT ARM  Final   Special Requests   Final    BOTTLES DRAWN AEROBIC AND ANAEROBIC Blood Culture results may not be optimal due to an inadequate volume of blood received in culture bottles   Culture   Final    NO GROWTH 5 DAYS Performed at [facility], [address] [place], [place] [address]    Report Status 08/21/2019 FINAL  Final  Respiratory Panel by RT PCR (Flu A&B, Covid) - Urine, Clean Catch     Status: None   Collection Time: 08/16/19  8:47 PM    Specimen: Urine, Clean Catch  Result Value Ref Range Status   SARS Coronavirus 2 by RT PCR NEGATIVE NEGATIVE Final    Comment: (NOTE) SARS-CoV-2 target nucleic acids are NOT DETECTED. The SARS-CoV-2 RNA is generally detectable in upper respiratoy specimens during the acute phase of infection. The lowest concentration of SARS-CoV-2 viral copies this assay can detect is 131 copies/mL. A negative result does not preclude SARS-Cov-2 infection and should not be used as the sole basis for treatment or other patient management decisions. A negative result may occur with  improper specimen collection/handling, submission of specimen other than nasopharyngeal swab, presence of viral mutation(s) within the areas targeted by this assay, and inadequate number of viral copies (<131 copies/mL). A negative result must be combined with clinical observations, patient history, and epidemiological information. The expected result is Negative. Fact Sheet for Patients:  PinkCheek.be Fact Sheet for Healthcare Providers:  GravelBags.it This test is not yet ap proved or cleared by the [place] FDA and  has been authorized for detection and/or diagnosis of SARS-CoV-2 by FDA under an Emergency Use Authorization (EUA). This EUA will remain  in effect (meaning this test can be used) for the duration of the COVID-19 declaration under Section 564(b)(1) of the Act, 21 U.S.C. section 360bbb-3(b)(1), unless the authorization is terminated or revoked sooner.    Influenza A by PCR NEGATIVE NEGATIVE Final   Influenza B by PCR NEGATIVE NEGATIVE Final    Comment: (NOTE) The Xpert Xpress SARS-CoV-2/FLU/RSV assay is intended as an aid in  the diagnosis of influenza from Nasopharyngeal swab specimens and  should not be used as a sole basis for treatment. Nasal washings and  aspirates are unacceptable for Xpert Xpress SARS-CoV-2/FLU/RSV  testing. Fact Sheet  for Patients: PinkCheek.be Fact Sheet for Healthcare Providers: GravelBags.it This test is not yet approved or cleared by the [place] FDA and  has been authorized for detection and/or diagnosis of SARS-CoV-2 by  FDA under an Emergency Use Authorization (EUA). This EUA will remain  in effect (meaning this test can be used) for the duration of the  Covid-19 declaration under Section 564(b)(1) of the Act, 21  U.S.C. section 360bbb-3(b)(1), unless the authorization is  terminated or revoked. Performed at [facility], [address], [place], [place] [address]   MRSA PCR Screening     Status: None   Collection Time: 08/26/19  4:00 AM  Specimen: Nasal Mucosa; Nasopharyngeal  Result Value Ref Range Status   MRSA by PCR NEGATIVE NEGATIVE Final    Comment:        The GeneXpert MRSA Assay (FDA approved for NASAL specimens only), is one component of a comprehensive MRSA colonization surveillance program. It is not intended to diagnose MRSA infection nor to guide or monitor treatment for MRSA infections. Performed at [facility], [address] [place], [place] [address]   CULTURE, BLOOD (ROUTINE X 2) w Reflex to ID Panel     Status: None (Preliminary result)   Collection Time: 08/29/19  8:37 AM   Specimen: BLOOD  Result Value Ref Range Status   Specimen Description BLOOD LEFT ANTECUBITAL  Final   Special Requests   Final    BOTTLES DRAWN AEROBIC AND ANAEROBIC Blood Culture adequate volume   Culture   Final    NO GROWTH < 24 HOURS Performed at [facility], [address] [place], [place] [address]    Report Status PENDING  Incomplete  CULTURE, BLOOD (ROUTINE X 2) w Reflex to ID Panel     Status: None (Preliminary result)   Collection Time: 08/29/19  8:37 AM   Specimen: BLOOD  Result Value Ref Range Status   Specimen Description BLOOD BLOOD LEFT HAND  Final   Special Requests   Final     BOTTLES DRAWN AEROBIC AND ANAEROBIC Blood Culture adequate volume   Culture   Final    NO GROWTH < 24 HOURS Performed at [facility], [address] [place], [place] [address]    Report Status PENDING  Incomplete    Coagulation Studies: No results for input(s): LABPROT, INR in the last 72 hours.  Urinalysis: No results for input(s): COLORURINE, LABSPEC, PHURINE, GLUCOSEU, HGBUR, BILIRUBINUR, KETONESUR, PROTEINUR, UROBILINOGEN, NITRITE, LEUKOCYTESUR in the last 72 hours.  Invalid input(s): APPERANCEUR    Imaging: DG Chest Port 1 View  Result Date: 08/29/2019 CLINICAL DATA:  Fever. EXAM: PORTABLE CHEST 1 VIEW COMPARISON:  08/16/2019. FINDINGS: Right-sided dual-lumen catheter in unchanged position. Stable cardiomegaly. Mild bilateral interstitial prominence again noted. Component these changes may be chronic however an active interstitial process including pneumonitis and or interstitial edema cannot be excluded. No pleural effusion or pneumothorax. IMPRESSION: 1.  Right-sided dual-lumen catheter in unchanged position. 2. Stable cardiomegaly. 3 mild bilateral interstitial prominence again noted. A component these changes may be chronic however an active interstitial process including pneumonitis and or interstitial edema cannot be excluded. Similar finding noted on prior exam. Electronically Signed   By: Lea   On: 08/29/2019 09:58     Medications:    . amLODipine  5 mg Oral Daily  . calcitRIOL  0.25 mcg Oral Daily  . Chlorhexidine Gluconate Cloth  6 each Topical Daily  . cloNIDine  0.2 mg Transdermal Weekly  . epoetin (EPOGEN/PROCRIT) injection  4,000 Units Intravenous Q T,Th,Sa-HD  . feeding supplement (NEPRO CARB STEADY)  237 mL Oral BID BM  . furosemide  80 mg Oral QODAY  . heparin  5,000 Units Subcutaneous Q12H  . insulin aspart  0-4 Units Subcutaneous Q4H  . multivitamin  1 tablet Oral QHS   acetaminophen **OR** acetaminophen, hydrALAZINE,  ipratropium-albuterol, labetalol, loperamide, magnesium hydroxide, ondansetron **OR** ondansetron (ZOFRAN) IV  Assessment/ Plan:  [name] is a 69 y.o. Hispanic female with end stage renal disease on hemodialysis, diabetes mellitus, hypertension, who was admitted 08/16/2019 for Encephalopathy [G93.40] Sepsis ([place]) [A41.9] Fever, unspecified fever cause [R50.9]  Altered mental status, unspecified altered mental status type [R41.82]  [facility] ([place]) [facility] [place] TTS Right AVF  1. End stage renal disease: TTS schedule.  Patient has moved to [facility] and will be transitioning to [place] and will be followed by [facility] Patient completed dialysis treatment today.  Tolerated well.  Next Alysis treatment on Tuesday.   2. Hypertension: Home regimen of amlodipine and furosemide. PRN hydralazine BP Readings from Last 3 Encounters:  08/30/19 (!) 172/68  08/14/19 (!) 161/100  07/30/19 (!) 185/79  Continue amlodipine, clonidine  3. Anemia of chronic kidney disease:   -Continue Epogen 4000 IV with dialysis treatments. Lab Results  Component Value Date   HGB 7.1 (L) 08/30/2019     4. Secondary Hyperparathyroidism of renal origin:    Lab Results  Component Value Date   PTH 269 (H) 03/22/2019   PTH Comment 03/22/2019   CALCIUM 7.8 (L) 08/29/2019   CAION 0.90 (L) 06/09/2019   PHOS 3.7 08/26/2019  Continue calcitriol 0.25 mcg p.o. daily   5. Encephalopathy:  Evaluated by neurologist 4/30 Depressed mental status is felt to be secondary to metabolic encephalopathy MRI brain no acute changes EEG consistent with severe metabolic encephalopathy Improved and appears to be back to baseline    LOS: 14 Chryl Holten 5/8/20216:38 PM

## 2019-08-30 NOTE — Progress Notes (Signed)
This note also relates to the following rows which could not be included: Pulse Rate - Cannot attach notes to unvalidated device data BP - Cannot attach notes to unvalidated device data  Hd completed

## 2019-08-30 NOTE — Progress Notes (Signed)
This note also relates to the following rows which could not be included: Pulse Rate - Cannot attach notes to unvalidated device data Resp - Cannot attach notes to unvalidated device data  Hd started  

## 2019-08-30 NOTE — Progress Notes (Signed)
PROGRESS NOTE    Amber Dixon   WPY:[id number]  DOB: 09/30/1950  PCP: Patient, No Pcp Per    DOA: 08/16/2019 LOS: 14   Brief Narrative   Medical records reviewed and are as summarized below:   Amber Dixon  is a 69 y.o. Hispanic female with a known history of type 2 diabetes mellitus, hypertension and chronic kidney disease, who presented to the emergency room with headache, dizziness, significantly elevated blood pressure and acute onset of altered mental status with confusion and decreased responsiveness on 08/16/2019.  The patient was seen in [facility] on 4/22 for elevated blood pressure and similar confusion after hemodialysis which has resolved with improved blood pressure.   On admission, her blood pressure was 216/111, she was febrile with temperature 102.2 and tachycardic with heart rate in the 120s.  She was admitted to the hospital for acute encephalopathy and probable sepsis.  She was found lethargic and minimally responsive.  She was treated with empiric IV antibiotics.  She was seen in consultation by the nephrologist and she underwent hemodialysis.  Her blood pressure has slowly improved.  She was also seen by the neurologist because of acute encephalopathy.  CT head and MRI brain did not show any acute abnormality.  Blood cultures were negative and there was no evidence of infection so antibiotics was discontinued.  Sepsis was ruled out.  Encephalopathy was thought to be due to metabolic encephalopathy versus hypertensive encephalopathy.   Encephalopathy slowly improved.  She required enteral nutrition via nasogastric tube.  Mental status is back to baseline and she has been able to tolerate a regular diet.  She developed nausea, abdominal cramps and diarrhea.    Assessment & Plan   Principal Problem:   Encephalopathy Active Problems:   ESRD (end stage renal disease) on dialysis ([place])   Altered mental status   DNR (do not  resuscitate) discussion   Palliative care by specialist   Protein-calorie malnutrition, severe  Fever - patient had 100.93F temp around 8pm on 5/6.  Resolved and has not recurrent --Chest xray negative --Blood cultures pending - follow, no growth to date --anuric so no UA --monitor closely for signs of infection  Acute anemia superimposed on Anemia of chronic disease: Hbg dropped from 10.0 on 5/1 to 7.2 on 5/6.  No reports of bleeding.  FOBT is negative.  Patient gets Epo with dialysis as needed.  This AM Hbg down to 7.1. --repeat H&H this afternoon, CBC in AM --transfuse if Hbg < 7.0  SIRS: No clear source of infection identified.  Antibiotics have been discontinued and patient remains without signs of infection.  Nausea, watery diarrhea and abdominal cramps: resolved.  Possibly was due to NG tube feeds.  Imodium as needed.  Doubt C. difficile infection at this time but will continue to monitor. Encouraged adequate oral hydration.   Acute metabolic encephalopathy: Improved.  It appears patient is at her baseline.  Work-up including CT head, MRI brain, TFT, ammonia and cultures unrevealing.  Suspect this was hypertensive encephalopathy.      Hypertension - Continue amlodipine and clonidine patch. Hypertensive emergency - present on admission, resolved.  Type 2 diabetes mellitus with hypoglycemic episodes: Lantus discontinued since enteral/NGT nutrition has been discontinued.  Continue NovoLog by custom sliding scale as needed (scale changed due to hypoglycemic episodes overnight).  Continue to monitor glucose levels closely.  Hypoglycemia protocol.  ESRD/bone marrow disorder: On HD TTS.  Nephrology following for dialysis.  Severe Protein Calorie  Malnutrition - NG tube placed 4/28, since removed.  Tolerating diet.  Continue Nepro supplement drinks.  Underweight - Patient BMI: Body mass index is 31.3 kg/m.   DVT prophylaxis: heparin  Diet:  Diet Orders (From admission, onward)      Start     Ordered   08/27/19 1901  DIET - DYS 1 Room service appropriate? Yes with Assist; Fluid consistency: Thin; Fluid restriction: 1200 mL Fluid  Diet effective now    Comments: Extra Gravy on meats, butter on potatoes. NO ICE IN DRINKS -- LIKES ROOM TEMP DRINKS. Puddings, Yogurt TID meals. Soups at lunch/dinner.  Question Answer Comment  Room service appropriate? Yes with Assist   Fluid consistency: Thin   Fluid restriction: 1200 mL Fluid      08/27/19 1901            Code Status: Full Code    Subjective 08/30/19    Patient seen during dialysis.  No acute events reported overnight.  She is sleeping but responds to stimulus.  Denies pain, chills or other complaints.     Disposition Plan & Communication   Status is: Inpatient  Remains inpatient appropriate because:SNF recommended, however, patient is undocumented and uninsured so SNF is not possible.  [facility] home health service options are being explored.  Medically, patient had significant drop in Hbg on 5/6 and also a recurrence of fever, both of these issues under evaluation.   Dispo: The patient is from: Home              Anticipated d/c is to: Home with home health services              Anticipated d/c date is: 1-2 days              Patient currently NOT medically stable for discharge   Family Communication: none at bedside, will attempt to call   Consults, Procedures, Significant Events   Consultants:   Nephrology  Procedures:   Dialysis     Objective   Vitals:   08/30/19 1300 08/30/19 1315 08/30/19 1330 08/30/19 1345  BP: (!) 89/29 (!) 77/45 (!) 87/47 (!) 98/45  Pulse: 94 93 93 94  Resp:  16    Temp:   98.1 F (36.7 C)   TempSrc:   Axillary   SpO2:      Weight:      Height:        Intake/Output Summary (Last 24 hours) at 08/30/2019 1353 Last data filed at 08/30/2019 1330 Gross per 24 hour  Intake --  Output 0 ml  Net 0 ml   Filed Weights   08/28/19 0351 08/29/19 0415 08/30/19 0525   Weight: 32.9 kg 34.3 kg 70.3 kg    Physical Exam:  General exam: sleeping in dialysis chair, no acute distress, underweight, frail appearing Respiratory system: CTAB, no wheezes, rales or rhonchi, normal respiratory effort. Cardiovascular system: normal S1/S2, RRR, no pedal edema.   Gastrointestinal system: soft, non-tender, nondistended Extremities: moves all, no cyanosis, normal tone  Labs   Data Reviewed: I have personally reviewed following labs and imaging studies  CBC: Recent Labs  Lab 08/28/19 0535 08/28/19 1554 08/29/19 0602 08/30/19 0449  WBC 6.7  --  8.3 7.9  NEUTROABS  --   --   --  5.3  HGB 7.2* 7.4* 7.3* 7.1*  HCT 22.6* 23.0* 22.9* 22.3*  MCV 84.6  --  85.1 84.8  PLT 288  --  277 814   Basic Metabolic Panel:  Recent Labs  Lab 08/26/19 0618 08/26/19 0935 08/29/19 0602  NA  --  137 136  K  --  3.9 4.7  CL  --  97* 94*  CO2  --  31 33*  GLUCOSE 149* 169* 104*  BUN  --  46* 33*  CREATININE  --  3.51* 2.80*  CALCIUM  --  7.5* 7.8*  PHOS  --  3.7  --    GFR: Estimated Creatinine Clearance: 15.9 mL/min (A) (by C-G formula based on SCr of 2.8 mg/dL (H)). Liver Function Tests: Recent Labs  Lab 08/26/19 0935  ALBUMIN 2.4*   No results for input(s): LIPASE, AMYLASE in the last 168 hours. Recent Labs  Lab 08/24/19 0403  AMMONIA 11   Coagulation Profile: No results for input(s): INR, PROTIME in the last 168 hours. Cardiac Enzymes: No results for input(s): CKTOTAL, CKMB, CKMBINDEX, TROPONINI in the last 168 hours. BNP (last 3 results) No results for input(s): PROBNP in the last 8760 hours. HbA1C: No results for input(s): HGBA1C in the last 72 hours. CBG: Recent Labs  Lab 08/29/19 1555 08/29/19 2019 08/29/19 2309 08/30/19 0402 08/30/19 0830  GLUCAP 180* 229* 159* 121* 100*   Lipid Profile: No results for input(s): CHOL, HDL, LDLCALC, TRIG, CHOLHDL, LDLDIRECT in the last 72 hours. Thyroid Function Tests: No results for input(s): TSH,  T4TOTAL, FREET4, T3FREE, THYROIDAB in the last 72 hours. Anemia Panel: No results for input(s): VITAMINB12, FOLATE, FERRITIN, TIBC, IRON, RETICCTPCT in the last 72 hours. Sepsis Labs: Recent Labs  Lab 08/29/19 0602  PROCALCITON 0.30    Recent Results (from the past 240 hour(s))  MRSA PCR Screening     Status: None   Collection Time: 08/26/19  4:00 AM   Specimen: Nasal Mucosa; Nasopharyngeal  Result Value Ref Range Status   MRSA by PCR NEGATIVE NEGATIVE Final    Comment:        The GeneXpert MRSA Assay (FDA approved for NASAL specimens only), is one component of a comprehensive MRSA colonization surveillance program. It is not intended to diagnose MRSA infection nor to guide or monitor treatment for MRSA infections. Performed at [facility], [address] [place], [place] [address]   CULTURE, BLOOD (ROUTINE X 2) w Reflex to ID Panel     Status: None (Preliminary result)   Collection Time: 08/29/19  8:37 AM   Specimen: BLOOD  Result Value Ref Range Status   Specimen Description BLOOD LEFT ANTECUBITAL  Final   Special Requests   Final    BOTTLES DRAWN AEROBIC AND ANAEROBIC Blood Culture adequate volume   Culture   Final    NO GROWTH < 24 HOURS Performed at [facility], [address], [place], [place] [address]    Report Status PENDING  Incomplete  CULTURE, BLOOD (ROUTINE X 2) w Reflex to ID Panel     Status: None (Preliminary result)   Collection Time: 08/29/19  8:37 AM   Specimen: BLOOD  Result Value Ref Range Status   Specimen Description BLOOD BLOOD LEFT HAND  Final   Special Requests   Final    BOTTLES DRAWN AEROBIC AND ANAEROBIC Blood Culture adequate volume   Culture   Final    NO GROWTH < 24 HOURS Performed at [facility], [address], [place], [place] [address]    Report Status PENDING  Incomplete      Imaging Studies   DG Chest Port 1 View  Result Date: 08/29/2019 CLINICAL DATA:  Fever. EXAM: PORTABLE  CHEST 1 VIEW  COMPARISON:  08/16/2019. FINDINGS: Right-sided dual-lumen catheter in unchanged position. Stable cardiomegaly. Mild bilateral interstitial prominence again noted. Component these changes may be chronic however an active interstitial process including pneumonitis and or interstitial edema cannot be excluded. No pleural effusion or pneumothorax. IMPRESSION: 1.  Right-sided dual-lumen catheter in unchanged position. 2. Stable cardiomegaly. 3 mild bilateral interstitial prominence again noted. A component these changes may be chronic however an active interstitial process including pneumonitis and or interstitial edema cannot be excluded. Similar finding noted on prior exam. Electronically Signed   By: Dale   On: 08/29/2019 09:58     Medications   Scheduled Meds: . amLODipine  5 mg Oral Daily  . calcitRIOL  0.25 mcg Oral Daily  . Chlorhexidine Gluconate Cloth  6 each Topical Daily  . cloNIDine  0.2 mg Transdermal Weekly  . epoetin (EPOGEN/PROCRIT) injection  4,000 Units Intravenous Q T,Th,Sa-HD  . feeding supplement (NEPRO CARB STEADY)  237 mL Oral BID BM  . furosemide  80 mg Oral QODAY  . heparin  5,000 Units Subcutaneous Q12H  . insulin aspart  0-4 Units Subcutaneous Q4H  . multivitamin  1 tablet Oral QHS   Continuous Infusions:     LOS: 14 days    Time spent: 20 minutes    Ezekiel Slocumb, DO [facility]  08/30/2019, 1:53 PM    If 7PM-7AM, please contact night-coverage. How to contact the [facility] Attending or Consulting provider [place] or covering provider during after hours [place], for this patient?    1. Check the care team in [facility] and look for a) attending/consulting [facility] provider listed and b) the [facility] team listed 2. Log into [email or web address] and use [place]'s universal password to access. If you do not have the password, please contact the hospital operator. 3. Locate the [facility] provider you are looking for under [facility] and page to a number that you  can be directly reached. 4. If you still have difficulty reaching the provider, please page the [facility] (Director on Call) for the Hospitalists listed on amion for assistance.

## 2019-08-31 ENCOUNTER — Encounter: Payer: Self-pay | Admitting: Family Medicine

## 2019-08-31 ENCOUNTER — Inpatient Hospital Stay: Payer: Medicaid Other

## 2019-08-31 LAB — CBC
HCT: 21.5 % — ABNORMAL LOW (ref 36.0–46.0)
Hemoglobin: 6.8 g/dL — ABNORMAL LOW (ref 12.0–15.0)
MCH: 27 pg (ref 26.0–34.0)
MCHC: 31.6 g/dL (ref 30.0–36.0)
MCV: 85.3 fL (ref 80.0–100.0)
Platelets: 288 10*3/uL (ref 150–400)
RBC: 2.52 MIL/uL — ABNORMAL LOW (ref 3.87–5.11)
RDW: 17.8 % — ABNORMAL HIGH (ref 11.5–15.5)
WBC: 8 10*3/uL (ref 4.0–10.5)
nRBC: 0.3 % — ABNORMAL HIGH (ref 0.0–0.2)

## 2019-08-31 LAB — GLUCOSE, CAPILLARY
Glucose-Capillary: 104 mg/dL — ABNORMAL HIGH (ref 70–99)
Glucose-Capillary: 127 mg/dL — ABNORMAL HIGH (ref 70–99)
Glucose-Capillary: 162 mg/dL — ABNORMAL HIGH (ref 70–99)
Glucose-Capillary: 175 mg/dL — ABNORMAL HIGH (ref 70–99)
Glucose-Capillary: 179 mg/dL — ABNORMAL HIGH (ref 70–99)
Glucose-Capillary: 81 mg/dL (ref 70–99)
Glucose-Capillary: 90 mg/dL (ref 70–99)

## 2019-08-31 LAB — HEMOGLOBIN AND HEMATOCRIT, BLOOD
HCT: 27.7 % — ABNORMAL LOW (ref 36.0–46.0)
Hemoglobin: 9 g/dL — ABNORMAL LOW (ref 12.0–15.0)

## 2019-08-31 LAB — PREPARE RBC (CROSSMATCH)

## 2019-08-31 LAB — ABO/RH: ABO/RH(D): O POS

## 2019-08-31 MED ORDER — SODIUM CHLORIDE 0.9% IV SOLUTION: Freq: Once | INTRAVENOUS | Status: AC

## 2019-08-31 NOTE — Progress Notes (Signed)
   08/31/19 2200  Clinical Encounter Type  Visited With Patient;Health care provider  Visit Type Initial;Spiritual support  Referral From Nurse  Consult/Referral To Chaplain  Chaplain responded to a RR. When chaplain arrived staff was just starting to work on patient. Staff spoke to patient through an interpreter. Chaplain stood outside room and prayed silently. After a few minutes chaplain left.

## 2019-08-31 NOTE — Progress Notes (Addendum)
CXR and abd xray done. Not complaining of pain at this time.

## 2019-08-31 NOTE — Progress Notes (Signed)
Rapid response called.

## 2019-08-31 NOTE — Progress Notes (Signed)
PROGRESS NOTE    Amber Dixon   PPJ:[id number]  DOB: [birth year]  PCP: Patient, No Pcp Per    DOA: 08/16/2019 LOS: 15   Brief Narrative   Medical records reviewed and are as summarized below:   Amber Dixon  is a 69 y.o. Hispanic female with a known history of type 2 diabetes mellitus, hypertension and chronic kidney disease, who presented to the emergency room with headache, dizziness, significantly elevated blood pressure and acute onset of altered mental status with confusion and decreased responsiveness on 08/16/2019.  The patient was seen in [facility] on 4/22 for elevated blood pressure and similar confusion after hemodialysis which has resolved with improved blood pressure.   On admission, her blood pressure was 216/111, she was febrile with temperature 102.2 and tachycardic with heart rate in the 120s.  She was admitted to the hospital for acute encephalopathy and probable sepsis.  She was found lethargic and minimally responsive.  She was treated with empiric IV antibiotics.  She was seen in consultation by the nephrologist and she underwent hemodialysis.  Her blood pressure has slowly improved.  She was also seen by the neurologist because of acute encephalopathy.  CT head and MRI brain did not show any acute abnormality.  Blood cultures were negative and there was no evidence of infection so antibiotics was discontinued.  Sepsis was ruled out.  Encephalopathy was thought to be due to metabolic encephalopathy versus hypertensive encephalopathy.   Encephalopathy slowly improved.  She required enteral nutrition via nasogastric tube.  Mental status is back to baseline and she has been able to tolerate a regular diet.  She developed nausea, abdominal cramps and diarrhea.    Assessment & Plan   Principal Problem:   Encephalopathy Active Problems:   ESRD (end stage renal disease) on dialysis ([place])   Altered mental status   DNR (do not  resuscitate) discussion   Palliative care by specialist   Protein-calorie malnutrition, severe  Fever - patient had 100.21F temp around 8pm on 5/6.  Resolved and has not recurred. --Chest xray negative --Blood cultures pending - follow, no growth to date --anuric so no UA --monitor closely for signs of infection  Acute anemia superimposed on Anemia of chronic disease: Hbg dropped from 10.0 on 5/1 to 7.2 on 5/6.  No reports of bleeding.  FOBT negative.  Patient gets Epo with dialysis as needed.  This AM Hbg down to 6.8.  Consent obtained from patient's son for RBC transfusion. --transfuse 1 unit pRBC's today --post-transfusion H&H and repeat CBC in AM --transfuse if Hbg < 7.0  SIRS: No clear source of infection identified.  Antibiotics have been discontinued and patient remains without signs of infection.  Nausea, watery diarrhea and abdominal cramps: resolved.  Possibly was due to NG tube feeds.  Imodium as needed.  Doubt C. difficile infection at this time but will continue to monitor. Encouraged adequate oral hydration.   Acute metabolic encephalopathy: Improved.  It appears patient is at her baseline.  Work-up including CT head, MRI brain, TFT, ammonia and cultures unrevealing.  Suspect this was hypertensive encephalopathy.      Hypotension - intermittent, patient has very labile BP's - monitor. Hypertension - Continue amlodipine and clonidine patch. Hypertensive emergency - present on admission, resolved.  Type 2 diabetes mellitus with hypoglycemic episodes: Lantus discontinued since enteral/NGT nutrition has been discontinued.  Continue NovoLog by custom sliding scale as needed (scale changed due to hypoglycemic episodes overnight).  Continue to  monitor glucose levels closely.  Hypoglycemia protocol.  ESRD/bone marrow disorder: On HD TTS.  Nephrology following for dialysis.  Severe Protein Calorie Malnutrition - NG tube placed 4/28, since removed.  Tolerating diet.  Continue  Nepro supplement drinks.  Underweight - Patient BMI: Body mass index is 15.59 kg/m.   DVT prophylaxis: heparin  Diet:  Diet Orders (From admission, onward)    Start     Ordered   08/27/19 1901  DIET - DYS 1 Room service appropriate? Yes with Assist; Fluid consistency: Thin; Fluid restriction: 1200 mL Fluid  Diet effective now    Comments: Extra Gravy on meats, butter on potatoes. NO ICE IN DRINKS -- LIKES ROOM TEMP DRINKS. Puddings, Yogurt TID meals. Soups at lunch/dinner.  Question Answer Comment  Room service appropriate? Yes with Assist   Fluid consistency: Thin   Fluid restriction: 1200 mL Fluid      08/27/19 1901            Code Status: Full Code    Subjective 08/31/19    Patient seen at bedside with son present this morning.  No acute events reported overnight.  Patient complains of some abdominal pain today, and feels tired and weak.  No other acute complaints.   Disposition Plan & Communication   Status is: Inpatient  Remains inpatient appropriate because: patient has acute on chronic anemia requiring blood transfusion today.  SNF recommended by therapy, however, patient is undocumented and uninsured so SNF is not possible.  [facility] home health service options are being arranged.    Dispo: The patient is from: Home              Anticipated d/c is to: Home with home health services              Anticipated d/c date is: 1-2 days              Patient currently NOT medically stable for discharge   Family Communication: Son , [name], updated at bedside this morning, gave consent for blood transfusion, updated on status and plan.  Also spoke with daughter in law, Lenna Sciara, by phone.  Son requests a letter documenting patient's physical limitations so they can provide to immigration to hopefully get more assistance for her.   Consults, Procedures, Significant Events   Consultants:   Nephrology  Procedures:   Dialysis     Objective   Vitals:   08/30/19  2139 08/31/19 0407 08/31/19 0500 08/31/19 0806  BP: (!) 154/66 (!) 161/66  (!) 85/38  Pulse: 89 84  88  Resp: 20 20  16   Temp: 99.2 F (37.3 C) 97.9 F (36.6 C)  (!) 97.3 F (36.3 C)  TempSrc: Oral Oral    SpO2: 93% 90%  95%  Weight:   35 kg   Height:        Intake/Output Summary (Last 24 hours) at 08/31/2019 1109 Last data filed at 08/30/2019 1330 Gross per 24 hour  Intake --  Output 0 ml  Net 0 ml   Filed Weights   08/29/19 0415 08/30/19 0525 08/31/19 0500  Weight: 34.3 kg 70.3 kg 35 kg    Physical Exam:  General exam: no acute distress, underweight, frail appearing Respiratory system: CTAB, no wheezes, rales or rhonchi, normal respiratory effort. Cardiovascular system: normal S1/S2, RRR, no pedal edema.   Gastrointestinal system: mildly tender, voluntary guarding, no rebound tenderness, nondistended Extremities: moves all, no cyanosis, normal tone  Labs   Data Reviewed: I have personally reviewed  following labs and imaging studies  CBC: Recent Labs  Lab 08/28/19 0535 08/28/19 0535 08/28/19 1554 08/29/19 0602 08/30/19 0449 08/30/19 1434 08/31/19 0543  WBC 6.7  --   --  8.3 7.9  --  8.0  NEUTROABS  --   --   --   --  5.3  --   --   HGB 7.2*   < > 7.4* 7.3* 7.1* 7.1* 6.8*  HCT 22.6*   < > 23.0* 22.9* 22.3* 22.0* 21.5*  MCV 84.6  --   --  85.1 84.8  --  85.3  PLT 288  --   --  277 303  --  288   < > = values in this interval not displayed.   Basic Metabolic Panel: Recent Labs  Lab 08/26/19 0618 08/26/19 0935 08/29/19 0602  NA  --  137 136  K  --  3.9 4.7  CL  --  97* 94*  CO2  --  31 33*  GLUCOSE 149* 169* 104*  BUN  --  46* 33*  CREATININE  --  3.51* 2.80*  CALCIUM  --  7.5* 7.8*  PHOS  --  3.7  --    GFR: Estimated Creatinine Clearance: 10.3 mL/min (A) (by C-G formula based on SCr of 2.8 mg/dL (H)). Liver Function Tests: Recent Labs  Lab 08/26/19 0935  ALBUMIN 2.4*   No results for input(s): LIPASE, AMYLASE in the last 168 hours. No results  for input(s): AMMONIA in the last 168 hours. Coagulation Profile: No results for input(s): INR, PROTIME in the last 168 hours. Cardiac Enzymes: No results for input(s): CKTOTAL, CKMB, CKMBINDEX, TROPONINI in the last 168 hours. BNP (last 3 results) No results for input(s): PROBNP in the last 8760 hours. HbA1C: No results for input(s): HGBA1C in the last 72 hours. CBG: Recent Labs  Lab 08/30/19 1737 08/30/19 2023 08/31/19 0019 08/31/19 0404 08/31/19 0802  GLUCAP 278* 103* 81 104* 90   Lipid Profile: No results for input(s): CHOL, HDL, LDLCALC, TRIG, CHOLHDL, LDLDIRECT in the last 72 hours. Thyroid Function Tests: No results for input(s): TSH, T4TOTAL, FREET4, T3FREE, THYROIDAB in the last 72 hours. Anemia Panel: No results for input(s): VITAMINB12, FOLATE, FERRITIN, TIBC, IRON, RETICCTPCT in the last 72 hours. Sepsis Labs: Recent Labs  Lab 08/29/19 0602  PROCALCITON 0.30    Recent Results (from the past 240 hour(s))  MRSA PCR Screening     Status: None   Collection Time: 08/26/19  4:00 AM   Specimen: Nasal Mucosa; Nasopharyngeal  Result Value Ref Range Status   MRSA by PCR NEGATIVE NEGATIVE Final    Comment:        The GeneXpert MRSA Assay (FDA approved for NASAL specimens only), is one component of a comprehensive MRSA colonization surveillance program. It is not intended to diagnose MRSA infection nor to guide or monitor treatment for MRSA infections. Performed at [facility], [address] [place], [place] [address]   CULTURE, BLOOD (ROUTINE X 2) w Reflex to ID Panel     Status: None (Preliminary result)   Collection Time: 08/29/19  8:37 AM   Specimen: BLOOD  Result Value Ref Range Status   Specimen Description BLOOD LEFT ANTECUBITAL  Final   Special Requests   Final    BOTTLES DRAWN AEROBIC AND ANAEROBIC Blood Culture adequate volume   Culture   Final    NO GROWTH 2 DAYS Performed at [facility], [address], [place],  [place] [address]  Report Status PENDING  Incomplete  CULTURE, BLOOD (ROUTINE X 2) w Reflex to ID Panel     Status: None (Preliminary result)   Collection Time: 08/29/19  8:37 AM   Specimen: BLOOD  Result Value Ref Range Status   Specimen Description BLOOD BLOOD LEFT HAND  Final   Special Requests   Final    BOTTLES DRAWN AEROBIC AND ANAEROBIC Blood Culture adequate volume   Culture   Final    NO GROWTH 2 DAYS Performed at [facility], [address] [place], [place] [address]    Report Status PENDING  Incomplete      Imaging Studies   No results found.   Medications   Scheduled Meds: . sodium chloride   Intravenous Once  . amLODipine  5 mg Oral Daily  . calcitRIOL  0.25 mcg Oral Daily  . Chlorhexidine Gluconate Cloth  6 each Topical Daily  . cloNIDine  0.2 mg Transdermal Weekly  . epoetin (EPOGEN/PROCRIT) injection  4,000 Units Intravenous Q T,Th,Sa-HD  . feeding supplement (NEPRO CARB STEADY)  237 mL Oral BID BM  . furosemide  80 mg Oral QODAY  . heparin  5,000 Units Subcutaneous Q12H  . insulin aspart  0-4 Units Subcutaneous Q4H  . multivitamin  1 tablet Oral QHS   Continuous Infusions:     LOS: 15 days    Time spent: 48 minutes total with > 50% spent in coordination of care and in direct patient contact.    Ezekiel Slocumb, DO [facility]  08/31/2019, 11:09 AM    If 7PM-7AM, please contact night-coverage. How to contact the [facility] Attending or Consulting provider [place] or covering provider during after hours [place], for this patient?    1. Check the care team in [facility] and look for a) attending/consulting TR[facility] provider listed and b) the [facility] team listed 2. Log into [email or web address] and use [place]'s universal password to access. If you do not have the password, please contact the hospital operator. 3. Locate the [facility] provider you are looking for under [facility] and page to a number that you can be directly reached. 4. If you still have  difficulty reaching the provider, please page the [facility] (Director on Call) for the Hospitalists listed on amion for assistance.

## 2019-08-31 NOTE — Progress Notes (Signed)
Amber Dixon here

## 2019-08-31 NOTE — Progress Notes (Addendum)
This patient is complaining of abd and chest pain after drinking a protein shake and eating applesauce. She said she is constipated. 164/67, ox 92% on 2l [place]. Lungs clear. ST heart rate 103. Resp 28 acts like she is trying to vomit. used interpreter video to assess. first shift had to put her on oxygen before her blood transfusion today B. Randol Kern notified.

## 2019-08-31 NOTE — Progress Notes (Signed)
PT Cancellation Note  Patient Details Name: Amber Dixon MRN: [id number] DOB: October 04, 1950   Cancelled Treatment:    Reason Eval/Treat Not Completed: Patient not medically ready    Checked with RN prior to attempt. Pt with blood transfusion this pm.  Will hold per protocol and continue tomorrow.   Chesley Noon 08/31/2019, 2:21 PM

## 2019-08-31 NOTE — Progress Notes (Signed)
[facility]  ROUNDING NOTE   Subjective:   Visit conducted with the aid of online interpreter today again. Patient reports that she is quite weak and tired. Had hemodialysis yesterday.  Objective:  Vital signs in last 24 hours:  Temp:  [97.3 F (36.3 C)-99.2 F (37.3 C)] 98 F (36.7 C) (05/09 1409) Pulse Rate:  [84-92] 86 (05/09 1409) Resp:  [16-20] 19 (05/09 1219) BP: (85-163)/(38-77) 163/70 (05/09 1409) SpO2:  [80 %-99 %] 99 % (05/09 1409) Weight:  [35 kg] 35 kg (05/09 0500)  Weight change: -35.3 kg Filed Weights   08/29/19 0415 08/30/19 0525 08/31/19 0500  Weight: 34.3 kg 70.3 kg 35 kg    Intake/Output: No intake/output data recorded.   Intake/Output this shift:  No intake/output data recorded.  Physical Exam: General: NAD, laying in bed  Head: Normocephalic, atraumatic. Moist oral mucosal membranes  Eyes: Anicteric,   Lungs:  Clear to auscultation  Heart: Regular rate and rhythm  Abdomen:  Soft, nontender  Extremities: no peripheral edema.  Neurologic: Awake, alert, conversant  Skin: No lesions  Access: Right IJ PermCath    Basic Metabolic Panel: Recent Labs  Lab 08/26/19 0618 08/26/19 0935 08/29/19 0602  NA  --  137 136  K  --  3.9 4.7  CL  --  97* 94*  CO2  --  31 33*  GLUCOSE 149* 169* 104*  BUN  --  46* 33*  CREATININE  --  3.51* 2.80*  CALCIUM  --  7.5* 7.8*  PHOS  --  3.7  --     Liver Function Tests: Recent Labs  Lab 08/26/19 0935  ALBUMIN 2.4*   No results for input(s): LIPASE, AMYLASE in the last 168 hours. No results for input(s): AMMONIA in the last 168 hours.  CBC: Recent Labs  Lab 08/28/19 0535 08/28/19 0535 08/28/19 1554 08/29/19 0602 08/30/19 0449 08/30/19 1434 08/31/19 0543  WBC 6.7  --   --  8.3 7.9  --  8.0  NEUTROABS  --   --   --   --  5.3  --   --   HGB 7.2*   < > 7.4* 7.3* 7.1* 7.1* 6.8*  HCT 22.6*   < > 23.0* 22.9* 22.3* 22.0* 21.5*  MCV 84.6  --   --  85.1 84.8  --  85.3  PLT 288  --   --   277 303  --  288   < > = values in this interval not displayed.    Cardiac Enzymes: No results for input(s): CKTOTAL, CKMB, CKMBINDEX, TROPONINI in the last 168 hours.  BNP: Invalid input(s): POCBNP  CBG: Recent Labs  Lab 08/30/19 2023 08/31/19 0019 08/31/19 0404 08/31/19 0802 08/31/19 1216  GLUCAP 103* 81 104* 90 179*    Microbiology: Results for orders placed or performed during the hospital encounter of 08/16/19  Urine culture     Status: None   Collection Time: 08/16/19  8:23 PM   Specimen: In/Out Cath Urine  Result Value Ref Range Status   Specimen Description   Final    IN/OUT CATH URINE Performed at [facility], [address], [place], [place] [address]    Special Requests   Final    NONE Performed at [facility], [address], [place], [place] [address]    Culture   Final    NO GROWTH Performed at [facility], [place] [address] [place], [place] [address]    Report Status 08/18/2019  FINAL  Final  Blood Culture (routine x 2)     Status: None   Collection Time: 08/16/19  8:29 PM   Specimen: BLOOD  Result Value Ref Range Status   Specimen Description BLOOD BLOOD LEFT FOREARM  Final   Special Requests   Final    BOTTLES DRAWN AEROBIC AND ANAEROBIC Blood Culture adequate volume   Culture   Final    NO GROWTH 5 DAYS Performed at [facility], [address] [place], [place] [address]    Report Status 08/21/2019 FINAL  Final  Blood Culture (routine x 2)     Status: None   Collection Time: 08/16/19  8:29 PM   Specimen: BLOOD  Result Value Ref Range Status   Specimen Description BLOOD BLOOD LEFT ARM  Final   Special Requests   Final    BOTTLES DRAWN AEROBIC AND ANAEROBIC Blood Culture results may not be optimal due to an inadequate volume of blood received in culture bottles   Culture   Final    NO GROWTH 5 DAYS Performed at [facility], [address] [place], [place] [address]    Report Status  08/21/2019 FINAL  Final  Respiratory Panel by RT PCR (Flu A&B, Covid) - Urine, Clean Catch     Status: None   Collection Time: 08/16/19  8:47 PM   Specimen: Urine, Clean Catch  Result Value Ref Range Status   SARS Coronavirus 2 by RT PCR NEGATIVE NEGATIVE Final    Comment: (NOTE) SARS-CoV-2 target nucleic acids are NOT DETECTED. The SARS-CoV-2 RNA is generally detectable in upper respiratoy specimens during the acute phase of infection. The lowest concentration of SARS-CoV-2 viral copies this assay can detect is 131 copies/mL. A negative result does not preclude SARS-Cov-2 infection and should not be used as the sole basis for treatment or other patient management decisions. A negative result may occur with  improper specimen collection/handling, submission of specimen other than nasopharyngeal swab, presence of viral mutation(s) within the areas targeted by this assay, and inadequate number of viral copies (<131 copies/mL). A negative result must be combined with clinical observations, patient history, and epidemiological information. The expected result is Negative. Fact Sheet for Patients:  PinkCheek.be Fact Sheet for Healthcare Providers:  GravelBags.it This test is not yet ap proved or cleared by the [place] FDA and  has been authorized for detection and/or diagnosis of SARS-CoV-2 by FDA under an Emergency Use Authorization (EUA). This EUA will remain  in effect (meaning this test can be used) for the duration of the COVID-19 declaration under Section 564(b)(1) of the Act, 21 U.S.C. section 360bbb-3(b)(1), unless the authorization is terminated or revoked sooner.    Influenza A by PCR NEGATIVE NEGATIVE Final   Influenza B by PCR NEGATIVE NEGATIVE Final    Comment: (NOTE) The Xpert Xpress SARS-CoV-2/FLU/RSV assay is intended as an aid in  the diagnosis of influenza from Nasopharyngeal swab specimens and  should  not be used as a sole basis for treatment. Nasal washings and  aspirates are unacceptable for Xpert Xpress SARS-CoV-2/FLU/RSV  testing. Fact Sheet for Patients: PinkCheek.be Fact Sheet for Healthcare Providers: GravelBags.it This test is not yet approved or cleared by the [place] FDA and  has been authorized for detection and/or diagnosis of SARS-CoV-2 by  FDA under an Emergency Use Authorization (EUA). This EUA will remain  in effect (meaning this test can be used) for the duration of the  Covid-19 declaration under Section 564(b)(1) of the Act,  21  U.S.C. section 360bbb-3(b)(1), unless the authorization is  terminated or revoked. Performed at [facility], [address] [place], [place] [address]   MRSA PCR Screening     Status: None   Collection Time: 08/26/19  4:00 AM   Specimen: Nasal Mucosa; Nasopharyngeal  Result Value Ref Range Status   MRSA by PCR NEGATIVE NEGATIVE Final    Comment:        The GeneXpert MRSA Assay (FDA approved for NASAL specimens only), is one component of a comprehensive MRSA colonization surveillance program. It is not intended to diagnose MRSA infection nor to guide or monitor treatment for MRSA infections. Performed at [facility], [address] [place], [place] [address]   CULTURE, BLOOD (ROUTINE X 2) w Reflex to ID Panel     Status: None (Preliminary result)   Collection Time: 08/29/19  8:37 AM   Specimen: BLOOD  Result Value Ref Range Status   Specimen Description BLOOD LEFT ANTECUBITAL  Final   Special Requests   Final    BOTTLES DRAWN AEROBIC AND ANAEROBIC Blood Culture adequate volume   Culture   Final    NO GROWTH 2 DAYS Performed at [facility], [address] [place], [place] [address]    Report Status PENDING  Incomplete  CULTURE, BLOOD (ROUTINE X 2) w Reflex to ID Panel     Status: None (Preliminary result)   Collection Time:  08/29/19  8:37 AM   Specimen: BLOOD  Result Value Ref Range Status   Specimen Description BLOOD BLOOD LEFT HAND  Final   Special Requests   Final    BOTTLES DRAWN AEROBIC AND ANAEROBIC Blood Culture adequate volume   Culture   Final    NO GROWTH 2 DAYS Performed at [facility], [address] [place], [place] [address]    Report Status PENDING  Incomplete    Coagulation Studies: No results for input(s): LABPROT, INR in the last 72 hours.  Urinalysis: No results for input(s): COLORURINE, LABSPEC, PHURINE, GLUCOSEU, HGBUR, BILIRUBINUR, KETONESUR, PROTEINUR, UROBILINOGEN, NITRITE, LEUKOCYTESUR in the last 72 hours.  Invalid input(s): APPERANCEUR    Imaging: No results found.   Medications:    . amLODipine  5 mg Oral Daily  . calcitRIOL  0.25 mcg Oral Daily  . Chlorhexidine Gluconate Cloth  6 each Topical Daily  . cloNIDine  0.2 mg Transdermal Weekly  . epoetin (EPOGEN/PROCRIT) injection  4,000 Units Intravenous Q T,Th,Sa-HD  . feeding supplement (NEPRO CARB STEADY)  237 mL Oral BID BM  . furosemide  80 mg Oral QODAY  . heparin  5,000 Units Subcutaneous Q12H  . insulin aspart  0-4 Units Subcutaneous Q4H  . multivitamin  1 tablet Oral QHS   acetaminophen **OR** acetaminophen, hydrALAZINE, ipratropium-albuterol, labetalol, loperamide, magnesium hydroxide, ondansetron **OR** ondansetron (ZOFRAN) IV  Assessment/ Plan:  [name] is a 69 y.o. Hispanic female with end stage renal disease on hemodialysis, diabetes mellitus, hypertension, who was admitted 08/16/2019 for Encephalopathy [G93.40] Sepsis ([place]) [A41.9] Fever, unspecified fever cause [R50.9] Altered mental status, unspecified altered mental status type [R41.82]  [facility] ([place]) [facility] TTS Right AVF  1. End stage renal disease: TTS schedule.  Patient underwent dialysis treatment yesterday.  Tolerated well.  No acute indication for dialysis today.  2.  Hypertension: Home regimen of amlodipine and furosemide. PRN hydralazine BP Readings from Last 3 Encounters:  08/31/19 (!) 163/70  08/14/19 (!) 161/100  07/30/19 (!) 185/79  We plan  to maintain the patient on current doses of amlodipine and clonidine.  3. Anemia of chronic kidney disease:   -Hemoglobin down to 6.8.  Consider blood transfusion but defer to primary team.  Otherwise maintain the patient on current dosage of Epogen. Lab Results  Component Value Date   HGB 6.8 (L) 08/31/2019     4. Secondary Hyperparathyroidism of renal origin:    Lab Results  Component Value Date   PTH 269 (H) 03/22/2019   PTH Comment 03/22/2019   CALCIUM 7.8 (L) 08/29/2019   CAION 0.90 (L) 06/09/2019   PHOS 3.7 08/26/2019  Continue calcitriol 0.25 mcg p.o. daily   5. Encephalopathy:  Evaluated by neurologist 4/30 Depressed mental status is felt to be secondary to metabolic encephalopathy MRI brain no acute changes EEG consistent with severe metabolic encephalopathy Improved and appears to be back to baseline    LOS: 15 Amber Dixon 5/9/20214:01 PM

## 2019-09-01 ENCOUNTER — Inpatient Hospital Stay: Payer: Medicaid Other

## 2019-09-01 LAB — TYPE AND SCREEN
ABO/RH(D): O POS
Antibody Screen: NEGATIVE
Unit division: 0

## 2019-09-01 LAB — CBC
HCT: 25.8 % — ABNORMAL LOW (ref 36.0–46.0)
HCT: 24.4 % — ABNORMAL LOW (ref 36.0–46.0)
Hemoglobin: 8.5 g/dL — ABNORMAL LOW (ref 12.0–15.0)
Hemoglobin: 8.3 g/dL — ABNORMAL LOW (ref 12.0–15.0)
MCH: 28 pg (ref 26.0–34.0)
MCH: 28.2 pg (ref 26.0–34.0)
MCHC: 32.9 g/dL (ref 30.0–36.0)
MCHC: 34 g/dL (ref 30.0–36.0)
MCV: 84.9 fL (ref 80.0–100.0)
MCV: 83 fL (ref 80.0–100.0)
Platelets: 284 10*3/uL (ref 150–400)
Platelets: 268 10*3/uL (ref 150–400)
RBC: 3.04 MIL/uL — ABNORMAL LOW (ref 3.87–5.11)
RBC: 2.94 MIL/uL — ABNORMAL LOW (ref 3.87–5.11)
RDW: 16.2 % — ABNORMAL HIGH (ref 11.5–15.5)
RDW: 16.4 % — ABNORMAL HIGH (ref 11.5–15.5)
WBC: 15.8 10*3/uL — ABNORMAL HIGH (ref 4.0–10.5)
WBC: 15 10*3/uL — ABNORMAL HIGH (ref 4.0–10.5)
nRBC: 0 % (ref 0.0–0.2)
nRBC: 0 % (ref 0.0–0.2)

## 2019-09-01 LAB — URINALYSIS, COMPLETE (UACMP) WITH MICROSCOPIC
Bacteria, UA: NONE SEEN
Bilirubin Urine: NEGATIVE
Glucose, UA: >=500 mg/dL — AB
Hgb urine dipstick: NEGATIVE
Ketones, ur: NEGATIVE mg/dL
Leukocytes,Ua: NEGATIVE
Nitrite: NEGATIVE
Protein, ur: >=300 mg/dL — AB
Specific Gravity, Urine: 1.015 (ref 1.005–1.030)
Squamous Epithelial / HPF: NONE SEEN (ref 0–5)
pH: 9 — ABNORMAL HIGH (ref 5.0–8.0)

## 2019-09-01 LAB — GLUCOSE, CAPILLARY
Glucose-Capillary: 122 mg/dL — ABNORMAL HIGH (ref 70–99)
Glucose-Capillary: 125 mg/dL — ABNORMAL HIGH (ref 70–99)
Glucose-Capillary: 135 mg/dL — ABNORMAL HIGH (ref 70–99)
Glucose-Capillary: 138 mg/dL — ABNORMAL HIGH (ref 70–99)
Glucose-Capillary: 140 mg/dL — ABNORMAL HIGH (ref 70–99)
Glucose-Capillary: 146 mg/dL — ABNORMAL HIGH (ref 70–99)
Glucose-Capillary: 230 mg/dL — ABNORMAL HIGH (ref 70–99)

## 2019-09-01 LAB — BPAM RBC
Blood Product Expiration Date: 202106032359
ISSUE DATE / TIME: 202105091344
Unit Type and Rh: 5100

## 2019-09-01 LAB — COMPREHENSIVE METABOLIC PANEL
ALT: 31 U/L (ref 0–44)
AST: 36 U/L (ref 15–41)
Albumin: 2.7 g/dL — ABNORMAL LOW (ref 3.5–5.0)
Alkaline Phosphatase: 99 U/L (ref 38–126)
Anion gap: 13 (ref 5–15)
BUN: 46 mg/dL — ABNORMAL HIGH (ref 8–23)
CO2: 30 mmol/L (ref 22–32)
Calcium: 7.8 mg/dL — ABNORMAL LOW (ref 8.9–10.3)
Chloride: 89 mmol/L — ABNORMAL LOW (ref 98–111)
Creatinine, Ser: 3.24 mg/dL — ABNORMAL HIGH (ref 0.44–1.00)
GFR calc Af Amer: 16 mL/min — ABNORMAL LOW (ref >60)
GFR calc non Af Amer: 14 mL/min — ABNORMAL LOW (ref >60)
Glucose, Bld: 149 mg/dL — ABNORMAL HIGH (ref 70–99)
Potassium: 4.7 mmol/L (ref 3.5–5.1)
Sodium: 132 mmol/L — ABNORMAL LOW (ref 135–145)
Total Bilirubin: 0.4 mg/dL (ref 0.3–1.2)
Total Protein: 6.1 g/dL — ABNORMAL LOW (ref 6.5–8.1)

## 2019-09-01 LAB — MRSA PCR SCREENING: MRSA by PCR: NEGATIVE

## 2019-09-01 LAB — LACTIC ACID, PLASMA
Lactic Acid, Venous: 1.4 mmol/L (ref 0.5–1.9)
Lactic Acid, Venous: 1.1 mmol/L (ref 0.5–1.9)

## 2019-09-01 LAB — LIPASE, BLOOD: Lipase: 40 U/L (ref 11–51)

## 2019-09-01 LAB — PROCALCITONIN: Procalcitonin: 1.54 ng/mL

## 2019-09-01 MED ORDER — PANTOPRAZOLE SODIUM 40 MG IV SOLR
40.0000 mg | INTRAVENOUS | Status: DC
  Administered 2019-09-01 – 2019-09-03 (×3): 40 mg via INTRAVENOUS
  Filled 2019-09-01 (×3): qty 40

## 2019-09-01 MED ORDER — METRONIDAZOLE IN NACL 5-0.79 MG/ML-% IV SOLN
500.0000 mg | Freq: Three times a day (TID) | INTRAVENOUS | Status: DC
  Filled 2019-09-01 (×2): qty 100

## 2019-09-01 MED ORDER — LOSARTAN POTASSIUM 50 MG PO TABS
50.0000 mg | ORAL_TABLET | Freq: Every day | ORAL | Status: DC
  Administered 2019-09-01 – 2019-09-04 (×4): 50 mg via ORAL
  Filled 2019-09-01 (×4): qty 1

## 2019-09-01 MED ORDER — SIMETHICONE 40 MG/0.6ML PO SUSP
40.0000 mg | Freq: Four times a day (QID) | ORAL | Status: DC | PRN
  Filled 2019-09-01: qty 0.6

## 2019-09-01 MED ORDER — ASCORBIC ACID 500 MG PO TABS
500.0000 mg | ORAL_TABLET | Freq: Two times a day (BID) | ORAL | Status: DC
  Administered 2019-09-02 – 2019-09-05 (×7): 500 mg via ORAL
  Filled 2019-09-01 (×7): qty 1

## 2019-09-01 MED ORDER — PIPERACILLIN-TAZOBACTAM IN DEX 2-0.25 GM/50ML IV SOLN
2.2500 g | Freq: Three times a day (TID) | INTRAVENOUS | Status: AC
  Administered 2019-09-01 – 2019-09-05 (×14): 2.25 g via INTRAVENOUS
  Filled 2019-09-01 (×16): qty 50

## 2019-09-01 MED ORDER — HYDROCODONE-ACETAMINOPHEN 5-325 MG PO TABS
1.0000 | ORAL_TABLET | Freq: Three times a day (TID) | ORAL | Status: DC | PRN
  Administered 2019-09-01: 1 via ORAL
  Filled 2019-09-01: qty 1

## 2019-09-01 MED ORDER — VANCOMYCIN HCL 500 MG/100ML IV SOLN: 500.0000 mg | INTRAVENOUS | Status: DC

## 2019-09-01 MED ORDER — VANCOMYCIN HCL IN DEXTROSE 1-5 GM/200ML-% IV SOLN
1000.0000 mg | Freq: Once | INTRAVENOUS | Status: AC
  Administered 2019-09-01: 1000 mg via INTRAVENOUS
  Filled 2019-09-01: qty 200

## 2019-09-01 NOTE — Progress Notes (Signed)
[facility]  ROUNDING NOTE   Subjective:  Patient resting comfortably in bed. She states that she has some intermittent abdominal pain. Due for dialysis treatment tomorrow.   Objective:  Vital signs in last 24 hours:  Temp:  [98 F (36.7 C)-100.4 F (38 C)] 99 F (37.2 C) (05/10 1204) Pulse Rate:  [86-100] 100 (05/10 1204) Resp:  [20-42] 24 (05/10 0853) BP: (150-168)/(58-80) 162/80 (05/10 1204) SpO2:  [89 %-99 %] 93 % (05/10 1204) Weight:  [37.2 kg] 37.2 kg (05/10 0359)  Weight change: 2.177 kg Filed Weights   08/30/19 0525 08/31/19 0500 09/01/19 0359  Weight: 70.3 kg 35 kg 37.2 kg    Intake/Output: I/O last 3 completed shifts: In: 865.3 [P.O.:500; Blood:340; IV Piggyback:25.3] Out: 100 [Urine:100]   Intake/Output this shift:  No intake/output data recorded.  Physical Exam: General: NAD, laying in bed  Head: Normocephalic, atraumatic. Moist oral mucosal membranes  Eyes: Anicteric,   Lungs:  Clear to auscultation  Heart: Regular rate and rhythm  Abdomen:  Soft, mild diffuse tenderness.  Extremities: no peripheral edema.  Neurologic: Awake, alert, conversant  Skin: No lesions  Access: Right IJ PermCath    Basic Metabolic Panel: Recent Labs  Lab 08/26/19 0618 08/26/19 0935 08/29/19 0602 09/01/19 0210  NA  --  137 136 132*  K  --  3.9 4.7 4.7  CL  --  97* 94* 89*  CO2  --  31 33* 30  GLUCOSE 149* 169* 104* 149*  BUN  --  46* 33* 46*  CREATININE  --  3.51* 2.80* 3.24*  CALCIUM  --  7.5* 7.8* 7.8*  PHOS  --  3.7  --   --     Liver Function Tests: Recent Labs  Lab 08/26/19 0935 09/01/19 0210  AST  --  36  ALT  --  31  ALKPHOS  --  99  BILITOT  --  0.4  PROT  --  6.1*  ALBUMIN 2.4* 2.7*   Recent Labs  Lab 09/01/19 0210  LIPASE 40   No results for input(s): AMMONIA in the last 168 hours.  CBC: Recent Labs  Lab 08/29/19 0602 08/29/19 0602 08/30/19 0449 08/30/19 0449 08/30/19 1434 08/31/19 0543 08/31/19 1729  09/01/19 0210 09/01/19 0501  WBC 8.3  --  7.9  --   --  8.0  --  15.8* 15.0*  NEUTROABS  --   --  5.3  --   --   --   --   --   --   HGB 7.3*   < > 7.1*   < > 7.1* 6.8* 9.0* 8.5* 8.3*  HCT 22.9*   < > 22.3*   < > 22.0* 21.5* 27.7* 25.8* 24.4*  MCV 85.1  --  84.8  --   --  85.3  --  84.9 83.0  PLT 277  --  303  --   --  288  --  284 268   < > = values in this interval not displayed.    Cardiac Enzymes: No results for input(s): CKTOTAL, CKMB, CKMBINDEX, TROPONINI in the last 168 hours.  BNP: Invalid input(s): POCBNP  CBG: Recent Labs  Lab 08/31/19 1954 08/31/19 2350 09/01/19 0357 09/01/19 0800 09/01/19 1141  GLUCAP 162* 175* 122* 125* 230*    Microbiology: Results for orders placed or performed during the hospital encounter of 08/16/19  Urine culture     Status: None   Collection Time: 08/16/19  8:23 PM   Specimen: In/Out Cath  Urine  Result Value Ref Range Status   Specimen Description   Final    IN/OUT CATH URINE Performed at [facility], [address] [place], [place] [address]    Special Requests   Final    NONE Performed at [facility], [address] [place], [place] [address]    Culture   Final    NO GROWTH Performed at [facility], [place] [address] [place], [place] [address]    Report Status 08/18/2019 FINAL  Final  Blood Culture (routine x 2)     Status: None   Collection Time: 08/16/19  8:29 PM   Specimen: BLOOD  Result Value Ref Range Status   Specimen Description BLOOD BLOOD LEFT FOREARM  Final   Special Requests   Final    BOTTLES DRAWN AEROBIC AND ANAEROBIC Blood Culture adequate volume   Culture   Final    NO GROWTH 5 DAYS Performed at [facility], [address] [place], [place] [address]    Report Status 08/21/2019 FINAL  Final  Blood Culture (routine x 2)     Status: None   Collection Time: 08/16/19  8:29 PM   Specimen: BLOOD  Result Value Ref Range Status   Specimen Description BLOOD BLOOD LEFT  ARM  Final   Special Requests   Final    BOTTLES DRAWN AEROBIC AND ANAEROBIC Blood Culture results may not be optimal due to an inadequate volume of blood received in culture bottles   Culture   Final    NO GROWTH 5 DAYS Performed at [facility], [address] [place],  [address]    Report Status 08/21/2019 FINAL  Final  Respiratory Panel by RT PCR (Flu A&B, Covid) - Urine, Clean Catch     Status: None   Collection Time: 08/16/19  8:47 PM   Specimen: Urine, Clean Catch  Result Value Ref Range Status   SARS Coronavirus 2 by RT PCR NEGATIVE NEGATIVE Final    Comment: (NOTE) SARS-CoV-2 target nucleic acids are NOT DETECTED. The SARS-CoV-2 RNA is generally detectable in upper respiratoy specimens during the acute phase of infection. The lowest concentration of SARS-CoV-2 viral copies this assay can detect is 131 copies/mL. A negative result does not preclude SARS-Cov-2 infection and should not be used as the sole basis for treatment or other patient management decisions. A negative result may occur with  improper specimen collection/handling, submission of specimen other than nasopharyngeal swab, presence of viral mutation(s) within the areas targeted by this assay, and inadequate number of viral copies (<131 copies/mL). A negative result must be combined with clinical observations, patient history, and epidemiological information. The expected result is Negative. Fact Sheet for Patients:  PinkCheek.be Fact Sheet for Healthcare Providers:  GravelBags.it This test is not yet ap proved or cleared by the [place] FDA and  has been authorized for detection and/or diagnosis of SARS-CoV-2 by FDA under an Emergency Use Authorization (EUA). This EUA will remain  in effect (meaning this test can be used) for the duration of the COVID-19 declaration under Section 564(b)(1) of the Act, 21 U.S.C. section  360bbb-3(b)(1), unless the authorization is terminated or revoked sooner.    Influenza A by PCR NEGATIVE NEGATIVE Final   Influenza B by PCR NEGATIVE NEGATIVE Final    Comment: (NOTE) The Xpert Xpress SARS-CoV-2/FLU/RSV assay is intended as an aid in  the diagnosis of influenza from Nasopharyngeal swab specimens and  should not be used as a sole  basis for treatment. Nasal washings and  aspirates are unacceptable for Xpert Xpress SARS-CoV-2/FLU/RSV  testing. Fact Sheet for Patients: PinkCheek.be Fact Sheet for Healthcare Providers: GravelBags.it This test is not yet approved or cleared by the [place] FDA and  has been authorized for detection and/or diagnosis of SARS-CoV-2 by  FDA under an Emergency Use Authorization (EUA). This EUA will remain  in effect (meaning this test can be used) for the duration of the  Covid-19 declaration under Section 564(b)(1) of the Act, 21  U.S.C. section 360bbb-3(b)(1), unless the authorization is  terminated or revoked. Performed at [facility], [address] [place], [place] [address]   MRSA PCR Screening     Status: None   Collection Time: 08/26/19  4:00 AM   Specimen: Nasal Mucosa; Nasopharyngeal  Result Value Ref Range Status   MRSA by PCR NEGATIVE NEGATIVE Final    Comment:        The GeneXpert MRSA Assay (FDA approved for NASAL specimens only), is one component of a comprehensive MRSA colonization surveillance program. It is not intended to diagnose MRSA infection nor to guide or monitor treatment for MRSA infections. Performed at [facility], [address] [place], [place] [address]   CULTURE, BLOOD (ROUTINE X 2) w Reflex to ID Panel     Status: None (Preliminary result)   Collection Time: 08/29/19  8:37 AM   Specimen: BLOOD  Result Value Ref Range Status   Specimen Description BLOOD LEFT ANTECUBITAL  Final   Special Requests   Final     BOTTLES DRAWN AEROBIC AND ANAEROBIC Blood Culture adequate volume   Culture   Final    NO GROWTH 3 DAYS Performed at [facility], [address] [place], [place] [address]    Report Status PENDING  Incomplete  CULTURE, BLOOD (ROUTINE X 2) w Reflex to ID Panel     Status: None (Preliminary result)   Collection Time: 08/29/19  8:37 AM   Specimen: BLOOD  Result Value Ref Range Status   Specimen Description BLOOD BLOOD LEFT HAND  Final   Special Requests   Final    BOTTLES DRAWN AEROBIC AND ANAEROBIC Blood Culture adequate volume   Culture   Final    NO GROWTH 3 DAYS Performed at [facility], [address] [place], [place] [address]    Report Status PENDING  Incomplete  CULTURE, BLOOD (ROUTINE X 2) w Reflex to ID Panel     Status: None (Preliminary result)   Collection Time: 09/01/19  5:01 AM   Specimen: BLOOD  Result Value Ref Range Status   Specimen Description BLOOD LEFT AC  Final   Special Requests   Final    BOTTLES DRAWN AEROBIC AND ANAEROBIC Blood Culture adequate volume   Culture   Final    NO GROWTH < 12 HOURS Performed at [facility], [address] [place], [place] [address]    Report Status PENDING  Incomplete  CULTURE, BLOOD (ROUTINE X 2) w Reflex to ID Panel     Status: None (Preliminary result)   Collection Time: 09/01/19  5:01 AM   Specimen: BLOOD  Result Value Ref Range Status   Specimen Description BLOOD LEFT HAN  Final   Special Requests   Final    BOTTLES DRAWN AEROBIC AND ANAEROBIC Blood Culture adequate volume   Culture   Final    NO GROWTH < 12 HOURS Performed at [facility], [address], [place], [place] [address]  Report Status PENDING  Incomplete    Coagulation Studies: No results for input(s): LABPROT, INR in the last 72 hours.  Urinalysis: Recent Labs    09/01/19 0442  COLORURINE YELLOW*  LABSPEC 1.015  PHURINE 9.0*  GLUCOSEU >=500*  HGBUR NEGATIVE  BILIRUBINUR NEGATIVE  KETONESUR  NEGATIVE  PROTEINUR >=300*  NITRITE NEGATIVE  LEUKOCYTESUR NEGATIVE      Imaging: DG Chest 1 View  Result Date: 08/31/2019 CLINICAL DATA:  69 year old female with chest and abdominal pain. EXAM: CHEST  1 VIEW; ABDOMEN - 1 VIEW COMPARISON:  Radiograph dated 08/19/2019. FINDINGS: Right-sided dialysis catheter with tip over right atrium. Bilateral confluent airspace opacities, new since the prior radiograph may represent edema but concerning for pneumonia. Clinical correlation recommended. No pleural effusion pneumothorax. Stable cardiomegaly. There is no bowel dilatation or evidence of obstruction. No free air or radiopaque calculi. Osteopenia. No acute osseous pathology. Partially visualized right femoral fixation hardware. IMPRESSION: 1. Bilateral confluent airspace opacities may represent edema but concerning for pneumonia. Clinical correlation is recommended. 2. No bowel obstruction. Electronically Signed   By: Anner Crete M.D.   On: 08/31/2019 23:31   DG Abd 1 View  Result Date: 08/31/2019 CLINICAL DATA:  69 year old female with chest and abdominal pain. EXAM: CHEST  1 VIEW; ABDOMEN - 1 VIEW COMPARISON:  Radiograph dated 08/19/2019. FINDINGS: Right-sided dialysis catheter with tip over right atrium. Bilateral confluent airspace opacities, new since the prior radiograph may represent edema but concerning for pneumonia. Clinical correlation recommended. No pleural effusion pneumothorax. Stable cardiomegaly. There is no bowel dilatation or evidence of obstruction. No free air or radiopaque calculi. Osteopenia. No acute osseous pathology. Partially visualized right femoral fixation hardware. IMPRESSION: 1. Bilateral confluent airspace opacities may represent edema but concerning for pneumonia. Clinical correlation is recommended. 2. No bowel obstruction. Electronically Signed   By: Anner Crete M.D.   On: 08/31/2019 23:31     Medications:   . piperacillin-tazobactam (ZOSYN)  IV 2.25 g  (09/01/19 1345)  . [START ON 09/02/2019] vancomycin     . amLODipine  5 mg Oral Daily  . calcitRIOL  0.25 mcg Oral Daily  . Chlorhexidine Gluconate Cloth  6 each Topical Daily  . cloNIDine  0.2 mg Transdermal Weekly  . epoetin (EPOGEN/PROCRIT) injection  4,000 Units Intravenous Q T,Th,Sa-HD  . feeding supplement (NEPRO CARB STEADY)  237 mL Oral BID BM  . furosemide  80 mg Oral QODAY  . heparin  5,000 Units Subcutaneous Q12H  . insulin aspart  0-4 Units Subcutaneous Q4H  . multivitamin  1 tablet Oral QHS  . pantoprazole (PROTONIX) IV  40 mg Intravenous Q24H   acetaminophen **OR** acetaminophen, hydrALAZINE, ipratropium-albuterol, labetalol, loperamide, magnesium hydroxide, ondansetron **OR** ondansetron (ZOFRAN) IV  Assessment/ Plan:  Amber Dixon is a 69 y.o. Hispanic female with end stage renal disease on hemodialysis, diabetes mellitus, hypertension, who was admitted 08/16/2019 for Encephalopathy [G93.40] Sepsis ([place]) [A41.9] Fever, unspecified fever cause [R50.9] Altered mental status, unspecified altered mental status type [R41.82]  [facility] ([place]) [facility] Right AVF  1. End stage renal disease: TTS schedule.  No acute indication for dialysis treatment today.  We will plan for dialysis treatment tomorrow if still here.  2. Hypertension: Home regimen of amlodipine and furosemide. PRN hydralazine BP Readings from Last 3 Encounters:  09/01/19 (!) 162/80  08/14/19 (!) 161/100  07/30/19 (!) 185/79  Add losartan 50 mg daily to her medication regimen  3. Anemia of chronic kidney disease:   -  Hemoglobin up to 8.3 posttransfusion.  Continue to monitor hemoglobin. Lab Results  Component Value Date   HGB 8.3 (L) 09/01/2019    4. Secondary Hyperparathyroidism of renal origin:    Lab Results  Component Value Date   PTH 269 (H) 03/22/2019   PTH Comment 03/22/2019   CALCIUM 7.8 (L) 09/01/2019   CAION 0.90 (L) 06/09/2019   PHOS  3.7 08/26/2019  Continue calcitriol 0.25 mcg p.o. daily phosphorus at target at 3.7.   5. Encephalopathy:  Evaluated by neurologist 4/30 Depressed mental status is felt to be secondary to metabolic encephalopathy MRI brain no acute changes EEG consistent with severe metabolic encephalopathy Improved and appears to be back to baseline    LOS: 16 Zebadiah Willert 5/10/20211:58 PM

## 2019-09-01 NOTE — Progress Notes (Signed)
Primary nurse was called to pt room to speak to family member. Pt was found having an episode of emisis and coughing repeatedly. Pt was cleaned up and primary nurse informed the son that the pt was on a Puree diet and that she could not eat the strawberries that he was feeding her. Soon after family left the nursing assistant obtained vitals and pt sats were in the 80's. Primary nurse rounded immediately  on pt and pt seemed to be choking. Two nurse pulled pt to an upright position in the bed. RR team already on the floor for a RR in room 210. Respiratory came over to assess this pt. Pt was suctioned and placed on nonrebreather. Chest xray ordered. Chest xray results shown to NP [facility]. Hassan Rowan gave orders to keep pt strict NPO. Primary nurse to continue to monitor pt.

## 2019-09-01 NOTE — Progress Notes (Signed)
PROGRESS NOTE    Amber Dixon   KDT:[id number]  DOB: 1951-01-29  PCP: Patient, No Pcp Per    DOA: 08/16/2019 LOS: 16   Brief Narrative   Medical records reviewed and are as summarized below:   Amber Dixon  is a 69 y.o. Hispanic female with a known history of type 2 diabetes mellitus, hypertension and chronic kidney disease, who presented to the emergency room with headache, dizziness, significantly elevated blood pressure and acute onset of altered mental status with confusion and decreased responsiveness on 08/16/2019.  The patient was seen in [facility] on 4/22 for elevated blood pressure and similar confusion after hemodialysis which has resolved with improved blood pressure.   On admission, her blood pressure was 216/111, she was febrile with temperature 102.2 and tachycardic with heart rate in the 120s.  She was admitted to the hospital for acute encephalopathy and probable sepsis.  She was found lethargic and minimally responsive.  She was treated with empiric IV antibiotics.  She was seen in consultation by the nephrologist and she underwent hemodialysis.  Her blood pressure has slowly improved.  She was also seen by the neurologist because of acute encephalopathy.  CT head and MRI brain did not show any acute abnormality.  Blood cultures were negative and there was no evidence of infection so antibiotics was discontinued.  Sepsis was ruled out.  Encephalopathy was thought to be due to metabolic encephalopathy versus hypertensive encephalopathy.   Encephalopathy slowly improved.  She required enteral nutrition via nasogastric tube.  Mental status is back to baseline and she has been able to tolerate a regular diet.  She developed nausea, abdominal cramps and diarrhea.    Assessment & Plan   Principal Problem:   Encephalopathy Active Problems:   ESRD (end stage renal disease) on dialysis ([place])   Altered mental status   DNR (do not  resuscitate) discussion   Palliative care by specialist   Protein-calorie malnutrition, severe  Fever - patient had 100.23F temp around 8pm on 5/6, recurred overnight 5/9-10.  Started on broad spectrum antibiotics overnight.  Chest xray did show bilateral airspace opacities concerning for pneumonia.  Lactic acid 1.1, procal elevated 1.54 but expect to be high in ESRD. --follow repeat cultures --continue Vanc/Zosyn for now --d/c Vanc if MRSA screen negative --Blood cultures pending - follow, no growth to date --anuric so no UA --monitor closely for signs of infection  Abdominal Pain - suspect constipation after getting Imodium to stop her diarrhea.  Monitor closely, hold off on stool softeners today and resume those tomorrow if still no BM.  She does not have an acute abdomen at this time. --simethicone as this may partially be gas pain --Tylenol or Norco PRN or discomfort/pain --Abdominal xray is pending - follow up  Acute anemia superimposed on Anemia of chronic disease: Hbg dropped from 10.0 on 5/1 to 7.2 on 5/6.  No reports of bleeding.  FOBT negative.  Patient gets Epo with dialysis as needed.  This AM Hbg down to 6.8.  Consent obtained from patient's son for RBC transfusion. --transfuse 1 unit pRBC's today --post-transfusion H&H and repeat CBC in AM --transfuse if Hbg < 7.0  SIRS: No clear source of infection identified.  Antibiotics have been discontinued and patient remains without signs of infection.  Nausea, watery diarrhea and abdominal cramps: resolved.  Possibly was due to NG tube feeds.  Imodium as needed.  Encouraged adequate oral hydration.   Acute metabolic encephalopathy: Improved.  It appears patient is at her baseline.  Work-up including CT head, MRI brain, TFT, ammonia and cultures unrevealing.  Suspect this was hypertensive encephalopathy.      Hypotension - intermittent, patient has very labile BP's - monitor. Hypertension - Continue amlodipine and clonidine  patch. Hypertensive emergency - present on admission, resolved.  Type 2 diabetes mellitus with hypoglycemic episodes: Lantus discontinued since enteral/NGT nutrition has been discontinued.  Continue NovoLog by custom sliding scale as needed (scale changed due to hypoglycemic episodes overnight).  Continue to monitor glucose levels closely.  Hypoglycemia protocol.  ESRD/bone marrow disorder: On HD TTS.  Nephrology following for dialysis.  Severe Protein Calorie Malnutrition - NG tube placed 4/28, since removed.  Tolerating diet.  Continue Nepro supplement drinks.  Underweight - Patient BMI: Body mass index is 16.56 kg/m.   DVT prophylaxis: heparin  Diet:  Diet Orders (From admission, onward)    Start     Ordered   08/27/19 1901  DIET - DYS 1 Room service appropriate? Yes with Assist; Fluid consistency: Thin; Fluid restriction: 1200 mL Fluid  Diet effective now    Comments: Extra Gravy on meats, butter on potatoes. NO ICE IN DRINKS -- LIKES ROOM TEMP DRINKS. Puddings, Yogurt TID meals. Soups at lunch/dinner.  Question Answer Comment  Room service appropriate? Yes with Assist   Fluid consistency: Thin   Fluid restriction: 1200 mL Fluid      08/27/19 1901            Code Status: Full Code    Subjective 09/01/19    Patient seen at bedside with son present this morning. Patient spike another fever and was started on broad spectrum antibiotics last night.  This AM, she reports still having abdominal pain.     Disposition Plan & Communication   Status is: Inpatient  Remains inpatient appropriate because: she has had recurrence of fever and possible pneumonia, on IV antibiotics with pending cultures.   In addition, SNF recommended by therapy, however, patient is undocumented and uninsured so SNF is not possible.  [facility] home health service options are being arranged.    Dispo: The patient is from: Home              Anticipated d/c is to: Home with home health services               Anticipated d/c date is: 1-2 days              Patient currently NOT medically stable for discharge   Family Communication: Son , [name], updated at bedside this morning, updated and questions answered.  Provided him letter for immigration that he had requested, as they may be able to get more assistance for patient.   Consults, Procedures, Significant Events   Consultants:   Nephrology  Procedures:   Dialysis     Objective   Vitals:   09/01/19 0359 09/01/19 0519 09/01/19 0853 09/01/19 1204  BP:  (!) 160/75 (!) 159/66 (!) 162/80  Pulse:  88 94 100  Resp:  (!) 30 (!) 24   Temp:  99.3 F (37.4 C)  99 F (37.2 C)  TempSrc:  Oral  Oral  SpO2:  92% (!) 89% 93%  Weight: 37.2 kg     Height:        Intake/Output Summary (Last 24 hours) at 09/01/2019 1832 Last data filed at 09/01/2019 1537 Gross per 24 hour  Intake 441.14 ml  Output 100 ml  Net 341.14 ml  Filed Weights   08/30/19 0525 08/31/19 0500 09/01/19 0359  Weight: 70.3 kg 35 kg 37.2 kg    Physical Exam:  General exam: no acute distress, underweight, frail appearing Respiratory system: CTAB, no wheezes, rales or rhonchi, normal respiratory effort. Cardiovascular system: normal S1/S2, RRR, no pedal edema.   Gastrointestinal system: mildly tender, voluntary guarding, no rebound tenderness, nondistended Extremities: moves all, no cyanosis, normal tone  Labs   Data Reviewed: I have personally reviewed following labs and imaging studies  CBC: Recent Labs  Lab 08/29/19 0602 08/29/19 0602 08/30/19 0449 08/30/19 0449 08/30/19 1434 08/31/19 0543 08/31/19 1729 09/01/19 0210 09/01/19 0501  WBC 8.3  --  7.9  --   --  8.0  --  15.8* 15.0*  NEUTROABS  --   --  5.3  --   --   --   --   --   --   HGB 7.3*   < > 7.1*   < > 7.1* 6.8* 9.0* 8.5* 8.3*  HCT 22.9*   < > 22.3*   < > 22.0* 21.5* 27.7* 25.8* 24.4*  MCV 85.1  --  84.8  --   --  85.3  --  84.9 83.0  PLT 277  --  303  --   --  288  --  284 268   < >  = values in this interval not displayed.   Basic Metabolic Panel: Recent Labs  Lab 08/26/19 0618 08/26/19 0935 08/29/19 0602 09/01/19 0210  NA  --  137 136 132*  K  --  3.9 4.7 4.7  CL  --  97* 94* 89*  CO2  --  31 33* 30  GLUCOSE 149* 169* 104* 149*  BUN  --  46* 33* 46*  CREATININE  --  3.51* 2.80* 3.24*  CALCIUM  --  7.5* 7.8* 7.8*  PHOS  --  3.7  --   --    GFR: Estimated Creatinine Clearance: 9.5 mL/min (A) (by C-G formula based on SCr of 3.24 mg/dL (H)). Liver Function Tests: Recent Labs  Lab 08/26/19 0935 09/01/19 0210  AST  --  36  ALT  --  31  ALKPHOS  --  99  BILITOT  --  0.4  PROT  --  6.1*  ALBUMIN 2.4* 2.7*   Recent Labs  Lab 09/01/19 0210  LIPASE 40   No results for input(s): AMMONIA in the last 168 hours. Coagulation Profile: No results for input(s): INR, PROTIME in the last 168 hours. Cardiac Enzymes: No results for input(s): CKTOTAL, CKMB, CKMBINDEX, TROPONINI in the last 168 hours. BNP (last 3 results) No results for input(s): PROBNP in the last 8760 hours. HbA1C: No results for input(s): HGBA1C in the last 72 hours. CBG: Recent Labs  Lab 08/31/19 2350 09/01/19 0357 09/01/19 0800 09/01/19 1141 09/01/19 1626  GLUCAP 175* 122* 125* 230* 138*   Lipid Profile: No results for input(s): CHOL, HDL, LDLCALC, TRIG, CHOLHDL, LDLDIRECT in the last 72 hours. Thyroid Function Tests: No results for input(s): TSH, T4TOTAL, FREET4, T3FREE, THYROIDAB in the last 72 hours. Anemia Panel: No results for input(s): VITAMINB12, FOLATE, FERRITIN, TIBC, IRON, RETICCTPCT in the last 72 hours. Sepsis Labs: Recent Labs  Lab 08/29/19 0602 09/01/19 0210 09/01/19 0501  PROCALCITON 0.30  --  1.54  LATICACIDVEN  --  1.4 1.1    Recent Results (from the past 240 hour(s))  MRSA PCR Screening     Status: None   Collection Time: 08/26/19  4:00 AM  Specimen: Nasal Mucosa; Nasopharyngeal  Result Value Ref Range Status   MRSA by PCR NEGATIVE NEGATIVE Final     Comment:        The GeneXpert MRSA Assay (FDA approved for NASAL specimens only), is one component of a comprehensive MRSA colonization surveillance program. It is not intended to diagnose MRSA infection nor to guide or monitor treatment for MRSA infections. Performed at [facility], [address] [place], [place] [address]   CULTURE, BLOOD (ROUTINE X 2) w Reflex to ID Panel     Status: None (Preliminary result)   Collection Time: 08/29/19  8:37 AM   Specimen: BLOOD  Result Value Ref Range Status   Specimen Description BLOOD LEFT ANTECUBITAL  Final   Special Requests   Final    BOTTLES DRAWN AEROBIC AND ANAEROBIC Blood Culture adequate volume   Culture   Final    NO GROWTH 3 DAYS Performed at [facility], [address], [place], [place] [address]    Report Status PENDING  Incomplete  CULTURE, BLOOD (ROUTINE X 2) w Reflex to ID Panel     Status: None (Preliminary result)   Collection Time: 08/29/19  8:37 AM   Specimen: BLOOD  Result Value Ref Range Status   Specimen Description BLOOD BLOOD LEFT HAND  Final   Special Requests   Final    BOTTLES DRAWN AEROBIC AND ANAEROBIC Blood Culture adequate volume   Culture   Final    NO GROWTH 3 DAYS Performed at [facility], [address] [place], [place] [address]    Report Status PENDING  Incomplete  CULTURE, BLOOD (ROUTINE X 2) w Reflex to ID Panel     Status: None (Preliminary result)   Collection Time: 09/01/19  5:01 AM   Specimen: BLOOD  Result Value Ref Range Status   Specimen Description BLOOD LEFT AC  Final   Special Requests   Final    BOTTLES DRAWN AEROBIC AND ANAEROBIC Blood Culture adequate volume   Culture   Final    NO GROWTH < 12 HOURS Performed at [facility], [address] [place], [place] [address]    Report Status PENDING  Incomplete  CULTURE, BLOOD (ROUTINE X 2) w Reflex to ID Panel     Status: None (Preliminary result)   Collection Time: 09/01/19  5:01 AM    Specimen: BLOOD  Result Value Ref Range Status   Specimen Description BLOOD LEFT HAN  Final   Special Requests   Final    BOTTLES DRAWN AEROBIC AND ANAEROBIC Blood Culture adequate volume   Culture   Final    NO GROWTH < 12 HOURS Performed at [facility], [address], [place], [place] [address]    Report Status PENDING  Incomplete  MRSA PCR Screening     Status: None   Collection Time: 09/01/19  1:46 PM   Specimen: Nasal Mucosa; Nasopharyngeal  Result Value Ref Range Status   MRSA by PCR NEGATIVE NEGATIVE Final    Comment:        The GeneXpert MRSA Assay (FDA approved for NASAL specimens only), is one component of a comprehensive MRSA colonization surveillance program. It is not intended to diagnose MRSA infection nor to guide or monitor treatment for MRSA infections. Performed at [facility], [place], [place] [address]       Imaging Studies   DG Chest 1 View  Result Date: 08/31/2019 CLINICAL DATA:  69 year old female with chest and  abdominal pain. EXAM: CHEST  1 VIEW; ABDOMEN - 1 VIEW COMPARISON:  Radiograph dated 08/19/2019. FINDINGS: Right-sided dialysis catheter with tip over right atrium. Bilateral confluent airspace opacities, new since the prior radiograph may represent edema but concerning for pneumonia. Clinical correlation recommended. No pleural effusion pneumothorax. Stable cardiomegaly. There is no bowel dilatation or evidence of obstruction. No free air or radiopaque calculi. Osteopenia. No acute osseous pathology. Partially visualized right femoral fixation hardware. IMPRESSION: 1. Bilateral confluent airspace opacities may represent edema but concerning for pneumonia. Clinical correlation is recommended. 2. No bowel obstruction. Electronically Signed   By: Anner Crete M.D.   On: 08/31/2019 23:31   DG Abd 1 View  Result Date: 08/31/2019 CLINICAL DATA:  69 year old female with chest and abdominal pain. EXAM: CHEST  1  VIEW; ABDOMEN - 1 VIEW COMPARISON:  Radiograph dated 08/19/2019. FINDINGS: Right-sided dialysis catheter with tip over right atrium. Bilateral confluent airspace opacities, new since the prior radiograph may represent edema but concerning for pneumonia. Clinical correlation recommended. No pleural effusion pneumothorax. Stable cardiomegaly. There is no bowel dilatation or evidence of obstruction. No free air or radiopaque calculi. Osteopenia. No acute osseous pathology. Partially visualized right femoral fixation hardware. IMPRESSION: 1. Bilateral confluent airspace opacities may represent edema but concerning for pneumonia. Clinical correlation is recommended. 2. No bowel obstruction. Electronically Signed   By: Anner Crete M.D.   On: 08/31/2019 23:31     Medications   Scheduled Meds: . amLODipine  5 mg Oral Daily  . vitamin C  500 mg Oral BID  . calcitRIOL  0.25 mcg Oral Daily  . Chlorhexidine Gluconate Cloth  6 each Topical Daily  . cloNIDine  0.2 mg Transdermal Weekly  . epoetin (EPOGEN/PROCRIT) injection  4,000 Units Intravenous Q T,Th,Sa-HD  . feeding supplement (NEPRO CARB STEADY)  237 mL Oral BID BM  . furosemide  80 mg Oral QODAY  . heparin  5,000 Units Subcutaneous Q12H  . insulin aspart  0-4 Units Subcutaneous Q4H  . losartan  50 mg Oral Daily  . multivitamin  1 tablet Oral QHS  . pantoprazole (PROTONIX) IV  40 mg Intravenous Q24H   Continuous Infusions: . piperacillin-tazobactam (ZOSYN)  IV Stopped (09/01/19 1445)  . [START ON 09/02/2019] vancomycin         LOS: 16 days    Time spent: 35 minutes total with > 50% spent in coordination of care and in direct patient contact.    Ezekiel Slocumb, DO [facility]  09/01/2019, 6:32 PM    If 7PM-7AM, please contact night-coverage. How to contact the [facility] Attending or Consulting provider [place] or covering provider during after hours [place], for this patient?    1. Check the care team in [facility] and look for a)  attending/consulting [facility] provider listed and b) the [facility] team listed 2. Log into [email or web address] and use [facility] universal password to access. If you do not have the password, please contact the hospital operator. 3. Locate the [facility] provider you are looking for under [facility] and page to a number that you can be directly reached. 4. If you still have difficulty reaching the provider, please page the [facility] (Director on Call) for the Hospitalists listed on amion for assistance.

## 2019-09-01 NOTE — Progress Notes (Signed)
CROSS COVER BRIEF NOTE Patient complained of chest then abdominal pain after eating per nursing report. Patient with low grade fever and new oxygen requirement reported from earlier. Heart rate 103 with RR 28-32. Through interpreter services patient complained of abdominal pain .  Bedside exam - patient appears mild distress and mild thachypnea. No wheezing Abdomen distended but soft. Diffuse tenderness  WBC 15.9HD  central catheter >5 days Lactate normal Chest and abd xray IMPRESSION: 1. Bilateral confluent airspace opacities may represent edema but concerning for pneumonia. Clinical correlation is recommended. 2. No bowel obstruction.  Patient with blood and urine cultures  procalcitonin  Pending DDX Reactive leukocytosis and fever from blood transfusion, TRALI,  HCAP, or central line infection.  Patient started on empiric vanc and zosyn.  F/u cultures. Consider TRALI investigation if respiratory symptoms worsen with ARDS development

## 2019-09-01 NOTE — Progress Notes (Signed)
Cross cover brief note Earlier in shift patient with acute aspiration event after being fed strawberries by family.  Patient with decreased oxygen saturations and required NRB to recover.  She then appeared stabilized with oxygen saturations 100% and no respiratory distress.  Chest x-ray  IMPRESSION: Cardiomegaly with interval progression of extensive bilateral airspace opacities, edema versus diffuse pneumonia. Increased right-sided pleural effusion. Indicative of aspiration Metronidazole added to vanc and zosyn Patient again with  respiratory distress coughing and working to breathe. Oxygen sats in 70's but recovered shortly after patient sat up and coughing improved Likely combination of aspiration pneumonia and fluid status being due for HD tomorrow.  Transferring to stepdown. Bipap/NRB as needed wean if able Consult CCM

## 2019-09-01 NOTE — Progress Notes (Addendum)
Spoke with Melissa on phone, niece, who speaks [place]. Updated her and she will pass along update from last night about change in condition. Voiced understanding.  RT notified to assess resp. Status, due to RR and also adm. Nebulizer.

## 2019-09-01 NOTE — Progress Notes (Signed)
Occupational Therapy Treatment Patient Details Name: Amber Dixon MRN: [id number] DOB: 1950/07/23 Today's Date: 09/01/2019    History of present illness 69 y.o. Hispanic female with a known history of type 2 diabetes mellitus, hypertension and chronic kidney disease, who presented to the emergency room with acute onset of altered mental status with confusion and decreased responsiveness with associated elevated blood pressure.  She was admitted at [facility] recently with similar presentation.   OT comments  Pt seen for OT tx this date to f/u re: safety/independence with ADLs/ADL mobility. Pt with somewhat improved visual attention/overall engagement this date. Pt continues to demo gross weakness of trunk and limbs. While pt can static stand with TOTAL A of 1 person, benefits this date from both disciplines co-treating to address weight shift/advancing feet to side step. Pt with somewhat improved activity tolerance (sits EOB x~8 mins to engage in self care with OT and 1 stand trial with MAX A +2 for 3-4 shuffling side steps to her right with OT assisting to offload while PTA manually advancing feet). While pt does demonstrate some improvement in overall tolerance, general ability to complete self care continues to be challenging. Pt requires MIN/MOD A with oral care this date, and MOD/MAX A with brushing hair in sitting. Overall, SNF continues to be most prudent d/c recommendation. Pt's OT goals updated to reflect current fxl level.   Follow Up Recommendations  SNF;Supervision/Assistance - 24 hour    Equipment Recommendations  Tub/shower seat;3 in 1 bedside commode    Recommendations for Other Services      Precautions / Restrictions Precautions Precautions: Fall Restrictions Weight Bearing Restrictions: No       Mobility Bed Mobility Overal bed mobility: Needs Assistance Bed Mobility: Supine to Sit;Sit to Supine     Supine to sit: Max assist Sit to supine: Max assist    General bed mobility comments: pt mildly participates in advancing LEs toward EOB with MAX verbal(through impterpreter) and tactile cues, however, primarily relies on therapists to transition to EOB sitting.  Transfers Overall transfer level: Needs assistance Equipment used: 2 person hand held assist Transfers: Sit to/from Stand Sit to Stand: Mod assist;Max assist;+2 physical assistance;+2 safety/equipment         General transfer comment: stands x1 trial (requires increased encouragement, repeatedly states to interpreter, "I can't stand up"). Stands ~20 seconds to participate in 3-4 shuffling side steps towards HOB. Requires MAX A bilaterally for weight shift and PTA manually advances pt's steps towards HOB.    Balance Overall balance assessment: Needs assistance Sitting-balance support: Feet unsupported;Single extremity supported Sitting balance-Leahy Scale: Fair Sitting balance - Comments: requires at least 1 UE support.   Standing balance support: Bilateral upper extremity supported;During functional activity Standing balance-Leahy Scale: Poor Standing balance comment: MOD A with B UE support to sustain static stand and MAX A +2 to weight shift to side step.                           ADL either performed or assessed with clinical judgement   ADL Overall ADL's : Needs assistance/impaired     Grooming: Brushing hair;Oral care;Sitting Grooming Details (indicate cue type and reason): MIN/MOD A in sitting to participate in oral care (with cues to hold the items herself rather than anticipate assistance), and MOD/MAX A to attempt to comb hair-very matted and difficult for OT to even assist.  Vision   Additional Comments: diffcult to formally assess, improved visual attention during this session, appears to mostly track appropriately.   Perception     Praxis      Cognition Arousal/Alertness: Lethargic Behavior  During Therapy: Flat affect Overall Cognitive Status: No family/caregiver present to determine baseline cognitive functioning                                 General Comments: Pt with somewhat improved engagement with interpreter this date, more talkative, some garbled speech with interpreter reporting difficulty understanding/translating at times. Pt endorses not remembering past therapy sessions, so likely she is disoriented although not formally assessed this date.        Exercises Other Exercises Other Exercises: OT educates pt re: importance of OOB activity. Pt with minimal reception of education demonstrated via interpreter. Interpreter: Costella Hatcher, ID # [id number]   Shoulder Instructions       General Comments      Pertinent Vitals/ Pain       Pain Assessment: Faces Faces Pain Scale: Hurts a little bit Pain Location: some vague grimmacing with mobilization Pain Descriptors / Indicators: Grimacing  Home Living                                          Prior Functioning/Environment              Frequency  Min 2X/week        Progress Toward Goals  OT Goals(current goals can now be found in the care plan section)  Progress towards OT goals: Goals drowngraded-see care plan  Acute Rehab OT Goals Patient Stated Goal: Go home OT Goal Formulation: With patient Time For Goal Achievement: 09/15/19 Potential to Achieve Goals: [place] Discharge plan remains appropriate    Co-evaluation    PT/OT/SLP Co-Evaluation/Treatment: Yes Reason for Co-Treatment: For patient/therapist safety;To address functional/ADL transfers PT goals addressed during session: Mobility/safety with mobility;Balance OT goals addressed during session: ADL's and self-care      AM-PAC OT "6 Clicks" Daily Activity     Outcome Measure   Help from another person eating meals?: A Lot Help from another person taking care of personal grooming?: A Lot Help from another  person toileting, which includes using toliet, bedpan, or urinal?: Total Help from another person bathing (including washing, rinsing, drying)?: A Lot Help from another person to put on and taking off regular upper body clothing?: A Lot Help from another person to put on and taking off regular lower body clothing?: Total 6 Click Score: 10    End of Session    OT Visit Diagnosis: Other abnormalities of gait and mobility (R26.89);Muscle weakness (generalized) (M62.81)   Activity Tolerance Other (comment)(somewhat self-limiting requiring encouragement throughout to participate)   Patient Left in bed;with call bell/phone within reach;with bed alarm set   Nurse Communication          Time: [phone] OT Time Calculation (min): 23 min  Charges: OT General Charges $OT Visit: 1 Visit OT Treatments $Self Care/Home Management : 8-22 mins  Gerrianne Scale, MS, OTR/L ascom [phone] 09/01/19, 10:13 AM

## 2019-09-01 NOTE — Progress Notes (Signed)
Physical Therapy Treatment Patient Details Name: Amber Dixon MRN: [id number] DOB: 1951-04-01 Today's Date: 09/01/2019    History of Present Illness 69 y.o. Hispanic female with a known history of type 2 diabetes mellitus, hypertension and chronic kidney disease, who presented to the emergency room with acute onset of altered mental status with confusion and decreased responsiveness with associated elevated blood pressure.  She was admitted at [facility] recently with similar presentation.    PT Comments    Co-tx with OT this date for improved outcomes.  PT worked on general bed mobility, standing and pre-gait activities, OT for ADL tasks.   1 unit billed each discipline per protocol.  RN ok'ed session.  Interpreter in [place] used.  [place] in attendance for session.  Was able to communicate fairly well with pt but at times he seemed to struggle to understand her due to fast, quiet and mumbling speech at times.    To EOB with mod a x 1 and min a x 1.  Once sitting she is generally steady but requires light min assist/touch for general safety.  She is able to remain sitting x 8 minutes and stand times x 1 with HHA x 2.  She does fairly well standing today but needs assist to weight shift and to step to move up in bed about 3' before sitting due to fatigue.  Returned to supine with mod a x 1 where she curls up on right side and falls back asleep.     Follow Up Recommendations  SNF     Equipment Recommendations  Wheelchair (measurements PT);3in1 (PT)   Hospital bed would be beneficial if she needs to return home.     Recommendations for Other Services       Precautions / Restrictions Precautions Precautions: Fall Restrictions Weight Bearing Restrictions: No    Mobility  Bed Mobility Overal bed mobility: Needs Assistance Bed Mobility: Supine to Sit;Sit to Supine     Supine to sit: Mod assist Sit to supine: Min assist   General bed mobility comments: pt mildly  participates in advancing LEs toward EOB with MAX verbal(through impterpreter) and tactile cues, however, primarily relies on therapists to transition to EOB sitting.  Transfers Overall transfer level: Needs assistance Equipment used: 2 person hand held assist Transfers: Sit to/from Stand Sit to Stand: Min assist;+2 physical assistance         General transfer comment: stands x1 trial (requires increased encouragement, repeatedly states to interpreter, "I can't stand up"). Stands ~20 seconds to participate in 3-4 shuffling side steps towards HOB. Requires MAX A bilaterally for weight shift and PTA manually advances pt's steps towards HOB.  Ambulation/Gait Ambulation/Gait assistance: Min assist;+2 physical assistance Gait Distance (Feet): 3 Feet Assistive device: 2 person hand held assist Gait Pattern/deviations: Step-to pattern Gait velocity: unable/unsafe   General Gait Details: sidestepping along bed about 4 steps with assist weight shift to assist with stepping.   Stairs             Wheelchair Mobility    Modified Rankin (Stroke Patients Only)       Balance Overall balance assessment: Needs assistance Sitting-balance support: Feet unsupported;Single extremity supported Sitting balance-Leahy Scale: Fair Sitting balance - Comments: supervision and light touch assist but no physical assist to maintain balance   Standing balance support: Bilateral upper extremity supported;During functional activity Standing balance-Leahy Scale: Poor Standing balance comment: MOD A with B UE support to sustain static stand to weight shift to side step.  Cognition Arousal/Alertness: Awake/alert Behavior During Therapy: Flat affect Overall Cognitive Status: No family/caregiver present to determine baseline cognitive functioning                                 General Comments: Pt with somewhat improved engagement with interpreter  this date, more talkative, some garbled speech with interpreter reporting difficulty understanding/translating at times. Pt endorses not remembering past therapy sessions, so likely she is disoriented although not formally assessed this date.      Exercises Other Exercises Other Exercises: OT educates pt re: importance of OOB activity. Pt with minimal reception of education demonstrated via interpreter. InterpreterCostella Hatcher, [place] # [id number]    General Comments        Pertinent Vitals/Pain Pain Assessment: No/denies pain Faces Pain Scale: Hurts a little bit Pain Location: some vague grimmacing with mobilization Pain Descriptors / Indicators: Grimacing Pain Intervention(s): Limited activity within patient's tolerance;Monitored during session;Premedicated before session    Home Living                      Prior Function            PT Goals (current goals can now be found in the care plan section) Acute Rehab PT Goals Patient Stated Goal: Go home Progress towards PT goals: Progressing toward goals    Frequency    Min 2X/week      PT Plan Current plan remains appropriate    Co-evaluation   Reason for Co-Treatment: For patient/therapist safety;To address functional/ADL transfers PT goals addressed during session: Mobility/safety with mobility;Balance OT goals addressed during session: ADL's and self-care      AM-PAC PT "6 Clicks" Mobility   Outcome Measure  Help needed turning from your back to your side while in a flat bed without using bedrails?: A Lot Help needed moving from lying on your back to sitting on the side of a flat bed without using bedrails?: A Lot Help needed moving to and from a bed to a chair (including a wheelchair)?: Total Help needed standing up from a chair using your arms (e.g., wheelchair or bedside chair)?: Total Help needed to walk in hospital room?: Total Help needed climbing 3-5 steps with a railing? : Total 6 Click Score: 8    End  of Session   Activity Tolerance: Patient limited by lethargy;Patient limited by fatigue Patient left: in bed;with call bell/phone within reach;with nursing/sitter in room;with bed alarm set Nurse Communication: Mobility status Pain - Right/Left: Left Pain - part of body: Leg     Time: [id number] PT Time Calculation (min) (ACUTE ONLY): 23 min  Charges:  $Therapeutic Activity: 23-37 mins                    Chesley Noon, PTA 09/01/19, 10:53 AM

## 2019-09-01 NOTE — Progress Notes (Signed)
Pt started hollering out. Primary nurse and nursing staff went directly to room. Pt was very SOB, Coughing repeatedly, O2 sats in the lower 70's. Rapid Response called. Orders received to transfer pt to stepdown.  Report Given to RN cynthia with all questions answered.

## 2019-09-01 NOTE — Progress Notes (Signed)
Nutrition Follow-up  DOCUMENTATION CODES:   Severe malnutrition in context of chronic illness, Underweight  INTERVENTION:   Nepro Shake po BID, each supplement provides 425 kcal and 19 grams protein  Add Magic cup TID with meals, each supplement provides 290 kcal and 9 grams of protein  Rena-vite daily   Add Vitamin C 500mg  po BID   Bedtime snack  NUTRITION DIAGNOSIS:   Severe Malnutrition related to chronic illness(ESRD on HD) as evidenced by severe fat depletion, severe muscle depletion  GOAL:   Patient will meet greater than or equal to 90% of their needs -progressing   MONITOR:   PO intake, Supplement acceptance, Labs, Weight trends, Skin, I & O's  ASSESSMENT:   69 year old female with PMHx of HTN, DM, ESRD on HD admitted with acute metabolic encephalopathy, hypertensive emergency.  Pt with fair appetite and oral intake; pt eating anywhere from sips/bites to 90% of meals and is drinking Nepro supplements. RD will add Magic Cups to meal trays. RD will also add vitamin C as pt at high risk for deficiency giving chronic HD and poor appetite. Next HD is 5/11. Pt complaining of abdominal pain overnight; NSF noted on KUB. Pt's weight currently up ~10lbs from her admit weight.   Medications reviewed and include: calcitriol, epoetin, lasix, heparin, insulin, rena-vite, protonix, zosyn, vancomycin    Labs reviewed: Na 132(L), BUN 46(H), creat 3.24(H) Wbc- 15.0(H), Hgb 8.3(L), Hct 24.4(L) cbgs- 122, 125, 230 x 24 hrs  Diet Order:   Diet Order            DIET - DYS 1 Room service appropriate? Yes with Assist; Fluid consistency: Thin; Fluid restriction: 1200 mL Fluid  Diet effective now             EDUCATION NEEDS:   No education needs have been identified at this time  Skin:  Skin Assessment: Reviewed RN Assessment  Last BM:  5/8- type 7  Height:   Ht Readings from Last 1 Encounters:  08/16/19 4\' 11"  (1.499 m)   Weight:   Wt Readings from Last 1 Encounters:   09/01/19 37.2 kg   Ideal Body Weight:  43.2 kg  BMI:  Body mass index is 16.56 kg/m.  Estimated Nutritional Needs:   Kcal:  1200-1400  Protein:  60-70 grams  Fluid:  UOP + 1 L  Koleen Distance MS, RD, LDN Please refer to [facility] for RD and/or RD on-call/weekend/after hours pager

## 2019-09-02 DIAGNOSIS — Z7189 Other specified counseling: Secondary | ICD-10-CM

## 2019-09-02 DIAGNOSIS — E43 Unspecified severe protein-calorie malnutrition: Secondary | ICD-10-CM

## 2019-09-02 DIAGNOSIS — J9601 Acute respiratory failure with hypoxia: Secondary | ICD-10-CM

## 2019-09-02 DIAGNOSIS — G9340 Encephalopathy, unspecified: Secondary | ICD-10-CM

## 2019-09-02 LAB — BASIC METABOLIC PANEL
Anion gap: 13 (ref 5–15)
BUN: 63 mg/dL — ABNORMAL HIGH (ref 8–23)
CO2: 28 mmol/L (ref 22–32)
Calcium: 7.7 mg/dL — ABNORMAL LOW (ref 8.9–10.3)
Chloride: 86 mmol/L — ABNORMAL LOW (ref 98–111)
Creatinine, Ser: 4.7 mg/dL — ABNORMAL HIGH (ref 0.44–1.00)
GFR calc Af Amer: 10 mL/min — ABNORMAL LOW (ref >60)
GFR calc non Af Amer: 9 mL/min — ABNORMAL LOW (ref >60)
Glucose, Bld: 155 mg/dL — ABNORMAL HIGH (ref 70–99)
Potassium: 6.5 mmol/L (ref 3.5–5.1)
Sodium: 127 mmol/L — ABNORMAL LOW (ref 135–145)

## 2019-09-02 LAB — CBC WITH DIFFERENTIAL/PLATELET
Abs Immature Granulocytes: 0.07 10*3/uL (ref 0.00–0.07)
Basophils Absolute: 0 10*3/uL (ref 0.0–0.1)
Basophils Relative: 0 %
Eosinophils Absolute: 0 10*3/uL (ref 0.0–0.5)
Eosinophils Relative: 0 %
HCT: 27.7 % — ABNORMAL LOW (ref 36.0–46.0)
Hemoglobin: 9.5 g/dL — ABNORMAL LOW (ref 12.0–15.0)
Immature Granulocytes: 1 %
Lymphocytes Relative: 1 %
Lymphs Abs: 0.2 10*3/uL — ABNORMAL LOW (ref 0.7–4.0)
MCH: 28.2 pg (ref 26.0–34.0)
MCHC: 34.3 g/dL (ref 30.0–36.0)
MCV: 82.2 fL (ref 80.0–100.0)
Monocytes Absolute: 0.2 10*3/uL (ref 0.1–1.0)
Monocytes Relative: 2 %
Neutro Abs: 12.3 10*3/uL — ABNORMAL HIGH (ref 1.7–7.7)
Neutrophils Relative %: 96 %
Platelets: 260 10*3/uL (ref 150–400)
RBC: 3.37 MIL/uL — ABNORMAL LOW (ref 3.87–5.11)
RDW: 16.3 % — ABNORMAL HIGH (ref 11.5–15.5)
WBC: 12.7 10*3/uL — ABNORMAL HIGH (ref 4.0–10.5)
nRBC: 0 % (ref 0.0–0.2)

## 2019-09-02 LAB — GLUCOSE, CAPILLARY
Glucose-Capillary: 144 mg/dL — ABNORMAL HIGH (ref 70–99)
Glucose-Capillary: 157 mg/dL — ABNORMAL HIGH (ref 70–99)
Glucose-Capillary: 179 mg/dL — ABNORMAL HIGH (ref 70–99)
Glucose-Capillary: 229 mg/dL — ABNORMAL HIGH (ref 70–99)
Glucose-Capillary: 234 mg/dL — ABNORMAL HIGH (ref 70–99)
Glucose-Capillary: 293 mg/dL — ABNORMAL HIGH (ref 70–99)

## 2019-09-02 MED ORDER — BUDESONIDE 0.5 MG/2ML IN SUSP
0.5000 mg | Freq: Two times a day (BID) | RESPIRATORY_TRACT | Status: DC
  Administered 2019-09-02 – 2019-09-04 (×6): 0.5 mg via RESPIRATORY_TRACT
  Filled 2019-09-02 (×6): qty 2

## 2019-09-02 MED ORDER — MORPHINE SULFATE (PF) 2 MG/ML IV SOLN
1.0000 mg | INTRAVENOUS | Status: DC | PRN
  Administered 2019-09-02: 1 mg via INTRAVENOUS
  Administered 2019-09-02: 2 mg via INTRAVENOUS
  Administered 2019-09-03: 1 mg via INTRAVENOUS
  Filled 2019-09-02 (×3): qty 1

## 2019-09-02 MED ORDER — METHYLPREDNISOLONE SODIUM SUCC 40 MG IJ SOLR
40.0000 mg | Freq: Two times a day (BID) | INTRAMUSCULAR | Status: DC
  Administered 2019-09-02 – 2019-09-03 (×4): 40 mg via INTRAVENOUS
  Filled 2019-09-02 (×4): qty 1

## 2019-09-02 MED ORDER — BISACODYL 10 MG RE SUPP
10.0000 mg | Freq: Every day | RECTAL | Status: DC | PRN
  Administered 2019-09-02: 10 mg via RECTAL
  Filled 2019-09-02: qty 1

## 2019-09-02 MED ORDER — HYDROCODONE-ACETAMINOPHEN 5-325 MG PO TABS
1.0000 | ORAL_TABLET | Freq: Three times a day (TID) | ORAL | Status: DC | PRN
  Administered 2019-09-02 – 2019-09-03 (×3): 1 via ORAL
  Filled 2019-09-02 (×3): qty 1

## 2019-09-02 MED ORDER — IPRATROPIUM-ALBUTEROL 0.5-2.5 (3) MG/3ML IN SOLN
3.0000 mL | RESPIRATORY_TRACT | Status: DC
  Administered 2019-09-02 – 2019-09-03 (×10): 3 mL via RESPIRATORY_TRACT
  Filled 2019-09-02 (×9): qty 3

## 2019-09-02 NOTE — Progress Notes (Signed)
HD tx ended 

## 2019-09-02 NOTE — Progress Notes (Signed)
Interpreter utilized as needed for assessments and updates on POC. Pt A&OX3. VSS. Weak grips but symmetrical. On HFNC adjusted by RT. Tolerating puree diet at this time while sitting up high with assistance. Perm cath and fistula intact. Awaiting dialysis. Family at bedside. Will continue to monitor.

## 2019-09-02 NOTE — Progress Notes (Signed)
HD started. 

## 2019-09-02 NOTE — Progress Notes (Signed)
PROGRESS NOTE    Amber Dixon   WSF:[id number]  DOB: Dec 19, 1950  PCP: Patient, No Pcp Per    DOA: 08/16/2019 LOS: 17   Brief Narrative   Medical records reviewed and are as summarized below:   Amber Dixon  is a 69 y.o. Hispanic female with a known history of type 2 diabetes mellitus, hypertension and chronic kidney disease, who presented to the emergency room with headache, dizziness, significantly elevated blood pressure and acute onset of altered mental status with confusion and decreased responsiveness on 08/16/2019.  The patient was seen in [facility] on 4/22 for elevated blood pressure and similar confusion after hemodialysis which has resolved with improved blood pressure.   On admission, her blood pressure was 216/111, she was febrile with temperature 102.2 and tachycardic with heart rate in the 120s.  She was admitted to the hospital for acute encephalopathy and probable sepsis.  She was found lethargic and minimally responsive.  She was treated with empiric IV antibiotics.  She was seen in consultation by the nephrologist and she underwent hemodialysis.  Her blood pressure has slowly improved.  She was also seen by the neurologist because of acute encephalopathy.  CT head and MRI brain did not show any acute abnormality.  Blood cultures were negative and there was no evidence of infection so antibiotics was discontinued.  Sepsis was ruled out.  Encephalopathy was thought to be due to metabolic encephalopathy versus hypertensive encephalopathy.   Encephalopathy slowly improved.  She required enteral nutrition via nasogastric tube.  Mental status is back to baseline and she has been able to tolerate a regular diet.  She developed nausea, abdominal cramps and diarrhea.   5/10 PM - acute respiratory distress due to aspiration when family fed her strawberries.  Now on HFNC in stepdown.  Palliative is consulted.  Assessment & Plan   Principal  Problem:   Encephalopathy Active Problems:   ESRD (end stage renal disease) on dialysis (HCC)   Altered mental status   DNR (do not resuscitate) discussion   Palliative care by specialist   Protein-calorie malnutrition, severe  Acute respiratory failure with hypoxia secondary to aspiration - patient's son fed her strawberries last night and patient went into respiratory distress, transferred to stepdown unit. On high flow nasal cannula at 40 L/min this morning.   --continue supplemental O2, wean as able, keep O2 sat > 90% --pureed diet only --on Zosyn --MRSA screen negative, vanc d/c'd  Fever - patient had 100.110F temp around 8pm on 5/6, recurred overnight 5/9-10.  Started on  broad spectrum antibiotics.  Chest xray did show bilateral airspace opacities concerning for pneumonia. Mgmt as above. --follow repeat blood cultures  Abdominal Pain - suspect constipation after getting Imodium to stop her diarrhea.  Monitor closely, hold off on stool softeners today and resume those tomorrow if still no BM.  She does not have an acute abdomen at this time. --simethicone as this may be gas pain --Tylenol or Norco PRN or discomfort/pain  Acute anemia superimposed on Anemia of chronic disease: Hbg dropped from 10.0 on 5/1 to 7.2 on 5/6.  No reports of bleeding.  FOBT negative.  Patient gets Epo with dialysis as needed. Received 1 unit RBC transfusion.   --monitor CBC --transfuse if Hbg < 7.0  Nausea, watery diarrhea and abdominal cramps: resolved.  Possibly was due to NG tube feeds.  Imodium as needed.  Encouraged adequate oral hydration.   Acute metabolic encephalopathy: Improved.  It appears  patient is at her baseline.  Work-up including CT head, MRI brain, TFT, ammonia and cultures unrevealing.  Suspect this was hypertensive encephalopathy.      Hypotension - intermittent, patient has very labile BP's - monitor. Hypertension - Continue amlodipine and clonidine patch. Hypertensive emergency -  present on admission, resolved.  Type 2 diabetes mellitus with hypoglycemic episodes: Lantus discontinued since enteral/NGT nutrition has been discontinued.  Continue NovoLog by custom sliding scale as needed (scale changed due to hypoglycemic episodes overnight).  Continue to monitor glucose levels closely.  Hypoglycemia protocol.  ESRD/bone marrow disorder: On HD TTS.  Nephrology following for dialysis.  Severe Protein Calorie Malnutrition - NG tube placed 4/28, since removed.  Tolerating diet.  Continue Nepro supplement drinks.  Underweight - Patient BMI: Body mass index is 17.32 kg/m.   DVT prophylaxis: heparin  Diet:  Diet Orders (From admission, onward)    Start     Ordered   08/27/19 1901  DIET - DYS 1 Room service appropriate? Yes with Assist; Fluid consistency: Thin; Fluid restriction: 1200 mL Fluid  Diet effective now    Comments: Extra Gravy on meats, butter on potatoes. NO ICE IN DRINKS -- LIKES ROOM TEMP DRINKS. Puddings, Yogurt TID meals. Soups at lunch/dinner.  Question Answer Comment  Room service appropriate? Yes with Assist   Fluid consistency: Thin   Fluid restriction: 1200 mL Fluid      08/27/19 1901            Code Status: Full Code    Subjective 09/02/19    Patient seen at bedside with son present this morning.  Patient reports feeling okay.  Son was provided letter he had requested for immigration.  They have a family member in [place] who, if approved for visa, can be patient's caregiver at home.    Disposition Plan & Communication   Status is: Inpatient  Remains inpatient appropriate because: she has had recurrence of fever and possible pneumonia, on IV antibiotics with pending cultures.   In addition, SNF recommended by therapy, however, patient is undocumented and uninsured so SNF is not possible.  [facility] home [facility] service options are being arranged.    Dispo: The patient is from: Home              Anticipated d/c is to: Home with home  health services              Anticipated d/c date is: 1-2 days              Patient currently NOT medically stable for discharge   Family Communication: Son , [name], updated at bedside this morning.   Consults, Procedures, Significant Events   Consultants:   Nephrology  Procedures:   Dialysis     Objective   Vitals:   09/02/19 1300 09/02/19 1301 09/02/19 1400 09/02/19 1600  BP: (!) 147/72  (!) 147/73 (!) 143/69  Pulse: 83 83 84 77  Resp:  17 (!) 0 16  Temp:      TempSrc:      SpO2: 98%  92% (!) 89%  Weight:      Height:        Intake/Output Summary (Last 24 hours) at 09/02/2019 1656 Last data filed at 09/02/2019 1345 Gross per 24 hour  Intake 393.47 ml  Output 0 ml  Net 393.47 ml   Filed Weights   08/31/19 0500 09/01/19 0359 09/02/19 0510  Weight: 35 kg 37.2 kg 38.9 kg    Physical Exam:  General exam: no acute distress, underweight, frail appearing Respiratory system: decreased breath sounds, crackles, on HFNC at 45 L/min. Cardiovascular system: normal S1/S2, RRR, no pedal edema.   Gastrointestinal system: mildly tender, no guarding, no rebound tenderness, nondistended Extremities: moves all, no cyanosis, normal tone  Labs   Data Reviewed: I have personally reviewed following labs and imaging studies  CBC: Recent Labs  Lab 08/30/19 0449 08/30/19 1434 08/31/19 0543 08/31/19 1729 09/01/19 0210 09/01/19 0501 09/02/19 0520  WBC 7.9  --  8.0  --  15.8* 15.0* 12.7*  NEUTROABS 5.3  --   --   --   --   --  12.3*  HGB 7.1*   < > 6.8* 9.0* 8.5* 8.3* 9.5*  HCT 22.3*   < > 21.5* 27.7* 25.8* 24.4* 27.7*  MCV 84.8  --  85.3  --  84.9 83.0 82.2  PLT 303  --  288  --  284 268 260   < > = values in this interval not displayed.   Basic Metabolic Panel: Recent Labs  Lab 08/29/19 0602 09/01/19 0210 09/02/19 0520  NA 136 132* 127*  K 4.7 4.7 6.5*  CL 94* 89* 86*  CO2 33* 30 28  GLUCOSE 104* 149* 155*  BUN 33* 46* 63*  CREATININE 2.80* 3.24* 4.70*    CALCIUM 7.8* 7.8* 7.7*   GFR: Estimated Creatinine Clearance: 6.8 mL/min (A) (by C-G formula based on SCr of 4.7 mg/dL (H)). Liver Function Tests: Recent Labs  Lab 09/01/19 0210  AST 36  ALT 31  ALKPHOS 99  BILITOT 0.4  PROT 6.1*  ALBUMIN 2.7*   Recent Labs  Lab 09/01/19 0210  LIPASE 40   No results for input(s): AMMONIA in the last 168 hours. Coagulation Profile: No results for input(s): INR, PROTIME in the last 168 hours. Cardiac Enzymes: No results for input(s): CKTOTAL, CKMB, CKMBINDEX, TROPONINI in the last 168 hours. BNP (last 3 results) No results for input(s): PROBNP in the last 8760 hours. HbA1C: No results for input(s): HGBA1C in the last 72 hours. CBG: Recent Labs  Lab 09/01/19 2342 09/02/19 0426 09/02/19 0753 09/02/19 1122 09/02/19 1555  GLUCAP 146* 157* 144* 229* 234*   Lipid Profile: No results for input(s): CHOL, HDL, LDLCALC, TRIG, CHOLHDL, LDLDIRECT in the last 72 hours. Thyroid Function Tests: No results for input(s): TSH, T4TOTAL, FREET4, T3FREE, THYROIDAB in the last 72 hours. Anemia Panel: No results for input(s): VITAMINB12, FOLATE, FERRITIN, TIBC, IRON, RETICCTPCT in the last 72 hours. Sepsis Labs: Recent Labs  Lab 08/29/19 0602 09/01/19 0210 09/01/19 0501  PROCALCITON 0.30  --  1.54  LATICACIDVEN  --  1.4 1.1    Recent Results (from the past 240 hour(s))  MRSA PCR Screening     Status: None   Collection Time: 08/26/19  4:00 AM   Specimen: Nasal Mucosa; Nasopharyngeal  Result Value Ref Range Status   MRSA by PCR NEGATIVE NEGATIVE Final    Comment:        The GeneXpert MRSA Assay (FDA approved for NASAL specimens only), is one component of a comprehensive MRSA colonization surveillance program. It is not intended to diagnose MRSA infection nor to guide or monitor treatment for MRSA infections. Performed at [facility], [address] [place], [place] [address]   CULTURE, BLOOD (ROUTINE X 2) w Reflex to ID  Panel     Status: None (Preliminary result)   Collection Time: 08/29/19  8:37 AM   Specimen: BLOOD  Result Value Ref Range  Status   Specimen Description BLOOD LEFT ANTECUBITAL  Final   Special Requests   Final    BOTTLES DRAWN AEROBIC AND ANAEROBIC Blood Culture adequate volume   Culture   Final    NO GROWTH 4 DAYS Performed at [facility], [address], [place], [place] [address]    Report Status PENDING  Incomplete  CULTURE, BLOOD (ROUTINE X 2) w Reflex to ID Panel     Status: None (Preliminary result)   Collection Time: 08/29/19  8:37 AM   Specimen: BLOOD  Result Value Ref Range Status   Specimen Description BLOOD BLOOD LEFT HAND  Final   Special Requests   Final    BOTTLES DRAWN AEROBIC AND ANAEROBIC Blood Culture adequate volume   Culture   Final    NO GROWTH 4 DAYS Performed at [facility], [address], [place], [place] [address]    Report Status PENDING  Incomplete  CULTURE, BLOOD (ROUTINE X 2) w Reflex to ID Panel     Status: None (Preliminary result)   Collection Time: 09/01/19  5:01 AM   Specimen: BLOOD  Result Value Ref Range Status   Specimen Description BLOOD LEFT AC  Final   Special Requests   Final    BOTTLES DRAWN AEROBIC AND ANAEROBIC Blood Culture adequate volume   Culture   Final    NO GROWTH 1 DAY Performed at [facility], [address] [place], [place] [address]    Report Status PENDING  Incomplete  CULTURE, BLOOD (ROUTINE X 2) w Reflex to ID Panel     Status: None (Preliminary result)   Collection Time: 09/01/19  5:01 AM   Specimen: BLOOD  Result Value Ref Range Status   Specimen Description BLOOD LEFT HAN  Final   Special Requests   Final    BOTTLES DRAWN AEROBIC AND ANAEROBIC Blood Culture adequate volume   Culture   Final    NO GROWTH 1 DAY Performed at [facility], [address] [place], [place] [address]    Report Status PENDING  Incomplete  MRSA PCR Screening     Status: None   Collection  Time: 09/01/19  1:46 PM   Specimen: Nasal Mucosa; Nasopharyngeal  Result Value Ref Range Status   MRSA by PCR NEGATIVE NEGATIVE Final    Comment:        The GeneXpert MRSA Assay (FDA approved for NASAL specimens only), is one component of a comprehensive MRSA colonization surveillance program. It is not intended to diagnose MRSA infection nor to guide or monitor treatment for MRSA infections. Performed at [facility], [place], [place] [address]       Imaging Studies   DG Chest 1 View  Result Date: 08/31/2019 CLINICAL DATA:  69 year old female with chest and abdominal pain. EXAM: CHEST  1 VIEW; ABDOMEN - 1 VIEW COMPARISON:  Radiograph dated 08/19/2019. FINDINGS: Right-sided dialysis catheter with tip over right atrium. Bilateral confluent airspace opacities, new since the prior radiograph may represent edema but concerning for pneumonia. Clinical correlation recommended. No pleural effusion pneumothorax. Stable cardiomegaly. There is no bowel dilatation or evidence of obstruction. No free air or radiopaque calculi. Osteopenia. No acute osseous pathology. Partially visualized right femoral fixation hardware. IMPRESSION: 1. Bilateral confluent airspace opacities may represent edema but concerning for pneumonia. Clinical correlation is recommended. 2. No bowel obstruction. Electronically Signed   By: Anner Crete M.D.   On: 08/31/2019 23:31   DG Abd 1 View  Result Date: 08/31/2019 CLINICAL DATA:  69 year old female with chest and abdominal pain. EXAM: CHEST  1 VIEW; ABDOMEN - 1 VIEW COMPARISON:  Radiograph dated 08/19/2019. FINDINGS: Right-sided dialysis catheter with tip over right atrium. Bilateral confluent airspace opacities, new since the prior radiograph may represent edema but concerning for pneumonia. Clinical correlation recommended. No pleural effusion pneumothorax. Stable cardiomegaly. There is no bowel dilatation or evidence of obstruction. No free air  or radiopaque calculi. Osteopenia. No acute osseous pathology. Partially visualized right femoral fixation hardware. IMPRESSION: 1. Bilateral confluent airspace opacities may represent edema but concerning for pneumonia. Clinical correlation is recommended. 2. No bowel obstruction. Electronically Signed   By: Anner Crete M.D.   On: 08/31/2019 23:31   DG Chest Port 1 View  Result Date: 09/01/2019 CLINICAL DATA:  Hypoxia EXAM: PORTABLE CHEST 1 VIEW COMPARISON:  08/31/2019, 08/29/2019 FINDINGS: Right-sided central venous catheter tip over the right atrium. Cardiomegaly with extensive bilateral airspace opacities progressed compared to prior radiograph. Increased right pleural effusion. No pneumothorax. IMPRESSION: Cardiomegaly with interval progression of extensive bilateral airspace opacities, edema versus diffuse pneumonia. Increased right-sided pleural effusion. Electronically Signed   By: Donavan Foil M.D.   On: 09/01/2019 21:23   DG Abd Portable 1V  Result Date: 09/01/2019 CLINICAL DATA:  Abdominal pain, possible constipation EXAM: PORTABLE ABDOMEN - 1 VIEW COMPARISON:  08/31/2019 FINDINGS: Scattered large and small bowel gas is noted similar to that seen on the previous day. No obstructive changes are noted. No findings of constipation are seen. No free air is noted. Stable changes in the lung bases are again seen. No acute bony abnormality is noted. Postsurgical changes in the right hip are seen. IMPRESSION: No acute abnormality in the abdomen. The overall appearance is similar to that seen on the previous day. Electronically Signed   By: Inez Catalina M.D.   On: 09/01/2019 18:37     Medications   Scheduled Meds: . amLODipine  5 mg Oral Daily  . vitamin C  500 mg Oral BID  . budesonide (PULMICORT) nebulizer solution  0.5 mg Nebulization BID  . calcitRIOL  0.25 mcg Oral Daily  . Chlorhexidine Gluconate Cloth  6 each Topical Daily  . cloNIDine  0.2 mg Transdermal Weekly  . epoetin  (EPOGEN/PROCRIT) injection  4,000 Units Intravenous Q T,Th,Sa-HD  . feeding supplement (NEPRO CARB STEADY)  237 mL Oral BID BM  . furosemide  80 mg Oral QODAY  . heparin  5,000 Units Subcutaneous Q12H  . insulin aspart  0-4 Units Subcutaneous Q4H  . ipratropium-albuterol  3 mL Nebulization Q4H  . losartan  50 mg Oral Daily  . methylPREDNISolone (SOLU-MEDROL) injection  40 mg Intravenous Q12H  . multivitamin  1 tablet Oral QHS  . pantoprazole (PROTONIX) IV  40 mg Intravenous Q24H   Continuous Infusions: . piperacillin-tazobactam (ZOSYN)  IV 2.25 g (09/02/19 1511)       LOS: 17 days    Time spent: 36 minutes total with > 50% spent in coordination of care and in direct patient contact.    Ezekiel Slocumb, DO [facility]  09/02/2019, 4:56 PM    If 7PM-7AM, please contact night-coverage. How to contact the [facility] Attending or Consulting provider [place] or covering provider during after hours [place], for this patient?    1. Check the care team in [facility] and look for a) attending/consulting [facility] provider listed and b) the [facility] team listed 2. Log into [email or web address] and use [place]'s universal password to access.  If you do not have the password, please contact the hospital operator. 3. Locate the [facility] provider you are looking for under [facility] and page to a number that you can be directly reached. 4. If you still have difficulty reaching the provider, please page the [facility] (Director on Call) for the Hospitalists listed on amion for assistance.

## 2019-09-02 NOTE — Progress Notes (Signed)
Pre HD  

## 2019-09-02 NOTE — Consult Note (Signed)
Name: Amber Dixon MRN: [id number] DOB: [birth year]    ADMISSION DATE:  08/16/2019 CONSULTATION DATE: 08/16/2019  REFERRING MD : [name], NP   CHIEF COMPLAINT: Shortness of Breath   BRIEF PATIENT DESCRIPTION:  69 yo female with ESRD on hemodialysis admitted to the medsurg unit with acute metabolic encephalopathy and hypertension.  She required transfer to the stepdown unit on 05/10 with acute hypoxic respiratory failure secondary to pneumonia and pulmonary edema vs. ARDS requiring HFNC   HISTORY OF PRESENT ILLNESS:  This is a 69 yo female with a PMH of ESRD on Hemodialysis (T-Th-Sat), HTN, and Type II Diabetes Mellitus. She presented to [facility] ER via EMS from home on 04/24 with acute onset of altered mental status (episode of inability to speak), fevers, and hypertension. She was previously admitted to [facility] on 08/14/2019 with hypertension and confusion following hemodialysis, which resolved following improved blood pressure. During current ER presentation she was treated with empiric abx for possible infection and admitted to the [facility] unit for additional workup and treatment.  CT Head and MRI Brain negative for acute abnormality.  She initially required enteral nutrition via nasogastric tube due to acute encephalopathy. Neurology consulted and felt acute encephalopathy likely secondary to metabolic derangements. During hospital course sepsis ruled out, acute encephalopathy slowly improving, blood cultures negative x2, and no obvious source of infection therefore empiric abx discontinued. However, on 05/10 rapid response initiated due to pt c/o chest and abdominal pain along with abdominal distension; pt febrile; and tachypneic.  CXR concerning for pulmonary edema vs. pneumonia, vancomycin and zosyn ordered.  On 05/10 rapid response initiated again secondary to pt developing acute hypoxic respiratory failure with increased work of breathing following an episode  of emesis/choking episode after pts family fed her strawberries despite pts puree diet order.  Repeat CXR revealed pneumonia and pulmonary edema vs. ARDS.  Due to pts increased work of breathing and hypoxia pt required transfer to the stepdown unit for HFNC PCCM consulted to assist with plan of care    PAST MEDICAL HISTORY :   has a past medical history of Diabetes mellitus without complication ([place]), Hypertension, and Renal disorder.  has a past surgical history that includes Leg Surgery; IR US Guide Vasc Access Right (04/02/2019); IR Fluoro Guide CV Line Right (04/02/2019); IR Fluoro Guide CV Line Right (04/13/2019); AV fistula placement (Right, 04/24/2019); AV fistula placement (Right); and Hip fracture surgery (Right). Prior to Admission medications   Medication Sig Start Date End Date Taking? Authorizing Provider  amLODipine (NORVASC) 10 MG tablet Take 1 tablet (10 mg total) by mouth daily. 06/12/19  Yes Lorella Nimrod, MD  PRESCRIPTION MEDICATION Inject 5 Units into the skin daily. Insulin from [place] [facility]). TAKES ON DAYS AFTER DIALYSIS   Yes [provider]  calcium carbonate (TUMS) 500 MG chewable tablet Chew 1 tablet (200 mg of elemental calcium total) by mouth 3 (three) times daily. Patient not taking: Reported on 08/17/2019 03/25/19 03/24/20  Al Decant, MD  calcium-vitamin D (OSCAL WITH D) 500-200 MG-UNIT tablet Take 1 tablet by mouth daily with breakfast.    [provider]  furosemide (LASIX) 80 MG tablet Take 1 tablet (80 mg total) by mouth 2 (two) times daily. Patient taking differently: Take 80 mg by mouth every other day.  03/25/19 04/24/19  Al Decant, MD  ondansetron (ZOFRAN ODT) 4 MG disintegrating tablet Take 1 tablet (4 mg total) by mouth every 8 (eight) hours as needed for nausea or vomiting. Patient  not taking: Reported on 08/17/2019 04/25/19   Jean Rosenthal, MD  PRESCRIPTION MEDICATION Inject 6 Units into the skin daily. Medication:Insulin Vial     [provider]   No Known Allergies  FAMILY HISTORY:  family history is not on file. SOCIAL HISTORY:  reports that she has never smoked. She has never used smokeless tobacco. She reports previous drug use.  REVIEW OF SYSTEMS: Positives in BOLD  Constitutional: fever, chills, weight loss, malaise/fatigue and diaphoresis.  HENT: Negative for hearing loss, ear pain, nosebleeds, congestion, sore throat, neck pain, tinnitus and ear discharge.   Eyes: Negative for blurred vision, double vision, photophobia, pain, discharge and redness.  Respiratory: cough, hemoptysis, sputum production, shortness of breath, wheezing and stridor.   Cardiovascular: Negative for chest pain, palpitations, orthopnea, claudication, leg swelling and PND.  Gastrointestinal: heartburn, nausea, vomiting, abdominal pain, diarrhea, constipation, blood in stool and melena.  Genitourinary: Negative for dysuria, urgency, frequency, hematuria and flank pain.  Musculoskeletal: Negative for myalgias, back pain, joint pain and falls.  Skin: Negative for itching and rash.  Neurological: Negative for dizziness, tingling, tremors, sensory change, speech change, focal weakness, seizures, loss of consciousness, weakness and headaches.  Endo/Heme/Allergies: Negative for environmental allergies and polydipsia. Does not bruise/bleed easily.  SUBJECTIVE:  Pt resting in bed   VITAL SIGNS: Temp:  [97.8 F (36.6 C)-99.3 F (37.4 C)] 98.2 F (36.8 C) (05/10 2330) Pulse Rate:  [81-100] 88 (05/10 2330) Resp:  [20-30] 23 (05/10 2320) BP: (159-179)/(66-80) 168/74 (05/10 2330) SpO2:  [73 %-100 %] 96 % (05/10 2330) FiO2 (%):  [80 %] 80 % (05/11 0034) Weight:  [37.2 kg] 37.2 kg (05/10 0359)  PHYSICAL EXAMINATION: General:chronically ill appearing frail female, resting in bed NAD Neuro: lethargic, follows commands, PERRL HEENT: supple, mild JVD present  Cardiovascular: nrs, rrr, no R/G  Lungs: rhonchi throughout, even, non  labored on HFNC  Abdomen: +BS x4, mild distension, tenderness, soft  Musculoskeletal: frail, normal tone  Skin: intact no rashes or lesions present   Recent Labs  Lab 08/26/19 0935 08/29/19 0602 09/01/19 0210  NA 137 136 132*  K 3.9 4.7 4.7  CL 97* 94* 89*  CO2 31 33* 30  BUN 46* 33* 46*  CREATININE 3.51* 2.80* 3.24*  GLUCOSE 169* 104* 149*   Recent Labs  Lab 08/31/19 0543 08/31/19 0543 08/31/19 1729 09/01/19 0210 09/01/19 0501  HGB 6.8*   < > 9.0* 8.5* 8.3*  HCT 21.5*   < > 27.7* 25.8* 24.4*  WBC 8.0  --   --  15.8* 15.0*  PLT 288  --   --  284 268   < > = values in this interval not displayed.   DG Chest 1 View  Result Date: 08/31/2019 CLINICAL DATA:  68 year old female with chest and abdominal pain. EXAM: CHEST  1 VIEW; ABDOMEN - 1 VIEW COMPARISON:  Radiograph dated 08/19/2019. FINDINGS: Right-sided dialysis catheter with tip over right atrium. Bilateral confluent airspace opacities, new since the prior radiograph may represent edema but concerning for pneumonia. Clinical correlation recommended. No pleural effusion pneumothorax. Stable cardiomegaly. There is no bowel dilatation or evidence of obstruction. No free air or radiopaque calculi. Osteopenia. No acute osseous pathology. Partially visualized right femoral fixation hardware. IMPRESSION: 1. Bilateral confluent airspace opacities may represent edema but concerning for pneumonia. Clinical correlation is recommended. 2. No bowel obstruction. Electronically Signed   By: Anner Crete M.D.   On: 08/31/2019 23:31   DG Abd 1 View  Result Date: 08/31/2019 CLINICAL  DATA:  69 year old female with chest and abdominal pain. EXAM: CHEST  1 VIEW; ABDOMEN - 1 VIEW COMPARISON:  Radiograph dated 08/19/2019. FINDINGS: Right-sided dialysis catheter with tip over right atrium. Bilateral confluent airspace opacities, new since the prior radiograph may represent edema but concerning for pneumonia. Clinical correlation recommended. No  pleural effusion pneumothorax. Stable cardiomegaly. There is no bowel dilatation or evidence of obstruction. No free air or radiopaque calculi. Osteopenia. No acute osseous pathology. Partially visualized right femoral fixation hardware. IMPRESSION: 1. Bilateral confluent airspace opacities may represent edema but concerning for pneumonia. Clinical correlation is recommended. 2. No bowel obstruction. Electronically Signed   By: Anner Crete M.D.   On: 08/31/2019 23:31   DG Chest Port 1 View  Result Date: 09/01/2019 CLINICAL DATA:  Hypoxia EXAM: PORTABLE CHEST 1 VIEW COMPARISON:  08/31/2019, 08/29/2019 FINDINGS: Right-sided central venous catheter tip over the right atrium. Cardiomegaly with extensive bilateral airspace opacities progressed compared to prior radiograph. Increased right pleural effusion. No pneumothorax. IMPRESSION: Cardiomegaly with interval progression of extensive bilateral airspace opacities, edema versus diffuse pneumonia. Increased right-sided pleural effusion. Electronically Signed   By: Donavan Foil M.D.   On: 09/01/2019 21:23   DG Abd Portable 1V  Result Date: 09/01/2019 CLINICAL DATA:  Abdominal pain, possible constipation EXAM: PORTABLE ABDOMEN - 1 VIEW COMPARISON:  08/31/2019 FINDINGS: Scattered large and small bowel gas is noted similar to that seen on the previous day. No obstructive changes are noted. No findings of constipation are seen. No free air is noted. Stable changes in the lung bases are again seen. No acute bony abnormality is noted. Postsurgical changes in the right hip are seen. IMPRESSION: No acute abnormality in the abdomen. The overall appearance is similar to that seen on the previous day. Electronically Signed   By: Inez Catalina M.D.   On: 09/01/2019 18:37    ASSESSMENT / PLAN:  Acute hypoxic respiratory failure secondary to pneumonia and pulmonary edema vs. ARDS Uncontrolled HTN  ESRD on hemodialysis  Anemia secondary to CKD no obvious signs of  active bleeding Abdominal pain/constipation  Supplemental O2 or prn Bipap for tachypnea and/or hypoxia  IV and nebulized steroids  Prn bronchodilator therapy  Trend WBC and monitor fever curve Follow cultures  Continue zosyn and discontinue flagyl; MRSA PCR negative will discontinue vancomycin  Continue outpatient antihypertensives  Trend BMP  Replace electrolytes as indicated  Avoid nephrotoxic medications  Nephrology consulted appreciate input-hemodialysis per recommendations  VTE px: subq heparin  Trend CBC  Monitor for s/sx bleeding, transfuse for hgb <7 CBG's q4hrs and SSI  Prn norco and/or morphine for pain management  Aspiration precautions   Marda Stalker, [place] Pager [phone] (please enter 7 [phone]) PCCM Consult Pager [phone] (please enter 7 digits)

## 2019-09-02 NOTE — Progress Notes (Signed)
[facility]  ROUNDING NOTE   Subjective:  Patient with worsening since yesterday. Respiratory status degraded. Currently on high flow nasal cannula. Due for dialysis treatment today. Hyperkalemia noted and serum potassium high at 6.5.   Objective:  Vital signs in last 24 hours:  Temp:  [97.8 F (36.6 C)-98.7 F (37.1 C)] 98.7 F (37.1 C) (05/11 0730) Pulse Rate:  [73-90] 83 (05/11 1000) Resp:  [7-27] 22 (05/11 1000) BP: (121-179)/(65-90) 121/90 (05/11 1000) SpO2:  [73 %-100 %] 97 % (05/11 1000) FiO2 (%):  [45 %-80 %] 45 % (05/11 1145) Weight:  [38.9 kg] 38.9 kg (05/11 0510)  Weight change: 1.705 kg Filed Weights   08/31/19 0500 09/01/19 0359 09/02/19 0510  Weight: 35 kg 37.2 kg 38.9 kg    Intake/Output: I/O last 3 completed shifts: In: 441.1 [P.O.:300; IV Piggyback:141.1] Out: 100 [Urine:100]   Intake/Output this shift:  Total I/O In: 153.5 [IV Piggyback:153.5] Out: 0   Physical Exam: General: NAD, laying in bed  Head: Normocephalic, atraumatic. Moist oral mucosal membranes  Eyes: Anicteric  Lungs:  Bilateral rales and rhonchi  Heart: Regular rate and rhythm  Abdomen:  Soft, distention noted.  Extremities: no peripheral edema.  Neurologic: Lethargic  Skin: No lesions  Access: Right IJ PermCath    Basic Metabolic Panel: Recent Labs  Lab 08/29/19 0602 09/01/19 0210 09/02/19 0520  NA 136 132* 127*  K 4.7 4.7 6.5*  CL 94* 89* 86*  CO2 33* 30 28  GLUCOSE 104* 149* 155*  BUN 33* 46* 63*  CREATININE 2.80* 3.24* 4.70*  CALCIUM 7.8* 7.8* 7.7*    Liver Function Tests: Recent Labs  Lab 09/01/19 0210  AST 36  ALT 31  ALKPHOS 99  BILITOT 0.4  PROT 6.1*  ALBUMIN 2.7*   Recent Labs  Lab 09/01/19 0210  LIPASE 40   No results for input(s): AMMONIA in the last 168 hours.  CBC: Recent Labs  Lab 08/30/19 0449 08/30/19 1434 08/31/19 0543 08/31/19 1729 09/01/19 0210 09/01/19 0501 09/02/19 0520  WBC 7.9  --  8.0  --  15.8* 15.0*  12.7*  NEUTROABS 5.3  --   --   --   --   --  12.3*  HGB 7.1*   < > 6.8* 9.0* 8.5* 8.3* 9.5*  HCT 22.3*   < > 21.5* 27.7* 25.8* 24.4* 27.7*  MCV 84.8  --  85.3  --  84.9 83.0 82.2  PLT 303  --  288  --  284 268 260   < > = values in this interval not displayed.    Cardiac Enzymes: No results for input(s): CKTOTAL, CKMB, CKMBINDEX, TROPONINI in the last 168 hours.  BNP: Invalid input(s): POCBNP  CBG: Recent Labs  Lab 09/01/19 2310 09/01/19 2342 09/02/19 0426 09/02/19 0753 09/02/19 1122  GLUCAP 140* 146* 157* 144* 229*    Microbiology: Results for orders placed or performed during the hospital encounter of 08/16/19  Urine culture     Status: None   Collection Time: 08/16/19  8:23 PM   Specimen: In/Out Cath Urine  Result Value Ref Range Status   Specimen Description   Final    IN/OUT CATH URINE Performed at [facility], [address], [place], [place] [address]    Special Requests   Final    NONE Performed at [facility], [address], [place], [place] [address]    Culture   Final    NO GROWTH Performed at [facility], [address] [place], [place] [address]    Report Status 08/18/2019 FINAL  Final  Blood Culture (routine x 2)     Status: None   Collection Time: 08/16/19  8:29 PM   Specimen: BLOOD  Result Value Ref Range Status   Specimen Description BLOOD BLOOD LEFT FOREARM  Final   Special Requests   Final    BOTTLES DRAWN AEROBIC AND ANAEROBIC Blood Culture adequate volume   Culture   Final    NO GROWTH 5 DAYS Performed at [facility], [address] [place], [place] [address]    Report Status 08/21/2019 FINAL  Final  Blood Culture (routine x 2)     Status: None   Collection Time: 08/16/19  8:29 PM   Specimen: BLOOD  Result Value Ref Range Status   Specimen Description BLOOD BLOOD LEFT ARM  Final   Special Requests   Final    BOTTLES DRAWN AEROBIC AND ANAEROBIC Blood Culture results may not be optimal due to  an inadequate volume of blood received in culture bottles   Culture   Final    NO GROWTH 5 DAYS Performed at [facility], [address] [place], [place] [address]    Report Status 08/21/2019 FINAL  Final  Respiratory Panel by RT PCR (Flu A&B, Covid) - Urine, Clean Catch     Status: None   Collection Time: 08/16/19  8:47 PM   Specimen: Urine, Clean Catch  Result Value Ref Range Status   SARS Coronavirus 2 by RT PCR NEGATIVE NEGATIVE Final    Comment: (NOTE) SARS-CoV-2 target nucleic acids are NOT DETECTED. The SARS-CoV-2 RNA is generally detectable in upper respiratoy specimens during the acute phase of infection. The lowest concentration of SARS-CoV-2 viral copies this assay can detect is 131 copies/mL. A negative result does not preclude SARS-Cov-2 infection and should not be used as the sole basis for treatment or other patient management decisions. A negative result may occur with  improper specimen collection/handling, submission of specimen other than nasopharyngeal swab, presence of viral mutation(s) within the areas targeted by this assay, and inadequate number of viral copies (<131 copies/mL). A negative result must be combined with clinical observations, patient history, and epidemiological information. The expected result is Negative. Fact Sheet for Patients:  PinkCheek.be Fact Sheet for Healthcare Providers:  GravelBags.it This test is not yet ap proved or cleared by the [place] FDA and  has been authorized for detection and/or diagnosis of SARS-CoV-2 by FDA under an Emergency Use Authorization (EUA). This EUA will remain  in effect (meaning this test can be used) for the duration of the COVID-19 declaration under Section 564(b)(1) of the Act, 21 U.S.C. section 360bbb-3(b)(1), unless the authorization is terminated or revoked sooner.    Influenza A by PCR NEGATIVE NEGATIVE Final   Influenza B  by PCR NEGATIVE NEGATIVE Final    Comment: (NOTE) The Xpert Xpress SARS-CoV-2/FLU/RSV assay is intended as an aid in  the diagnosis of influenza from Nasopharyngeal swab specimens and  should not be used as a sole basis for treatment. Nasal washings and  aspirates are unacceptable for Xpert Xpress SARS-CoV-2/FLU/RSV  testing. Fact Sheet for Patients: PinkCheek.be Fact Sheet for Healthcare Providers: GravelBags.it This test is not yet approved or cleared by the [place] FDA and  has been authorized for detection and/or diagnosis of SARS-CoV-2 by  FDA under an Emergency Use Authorization (EUA). This EUA will remain  in effect (meaning this test can be used) for the  duration of the  Covid-19 declaration under Section 564(b)(1) of the Act, 21  U.S.C. section 360bbb-3(b)(1), unless the authorization is  terminated or revoked. Performed at [facility], [address] [place], [place] [address]   MRSA PCR Screening     Status: None   Collection Time: 08/26/19  4:00 AM   Specimen: Nasal Mucosa; Nasopharyngeal  Result Value Ref Range Status   MRSA by PCR NEGATIVE NEGATIVE Final    Comment:        The GeneXpert MRSA Assay (FDA approved for NASAL specimens only), is one component of a comprehensive MRSA colonization surveillance program. It is not intended to diagnose MRSA infection nor to guide or monitor treatment for MRSA infections. Performed at [facility], [address] [place], [place] [address]   CULTURE, BLOOD (ROUTINE X 2) w Reflex to ID Panel     Status: None (Preliminary result)   Collection Time: 08/29/19  8:37 AM   Specimen: BLOOD  Result Value Ref Range Status   Specimen Description BLOOD LEFT ANTECUBITAL  Final   Special Requests   Final    BOTTLES DRAWN AEROBIC AND ANAEROBIC Blood Culture adequate volume   Culture   Final    NO GROWTH 4 DAYS Performed at [facility],  [address], [place], [place] [address]    Report Status PENDING  Incomplete  CULTURE, BLOOD (ROUTINE X 2) w Reflex to ID Panel     Status: None (Preliminary result)   Collection Time: 08/29/19  8:37 AM   Specimen: BLOOD  Result Value Ref Range Status   Specimen Description BLOOD BLOOD LEFT HAND  Final   Special Requests   Final    BOTTLES DRAWN AEROBIC AND ANAEROBIC Blood Culture adequate volume   Culture   Final    NO GROWTH 4 DAYS Performed at [facility], [address] [place], [place] [address]    Report Status PENDING  Incomplete  CULTURE, BLOOD (ROUTINE X 2) w Reflex to ID Panel     Status: None (Preliminary result)   Collection Time: 09/01/19  5:01 AM   Specimen: BLOOD  Result Value Ref Range Status   Specimen Description BLOOD LEFT AC  Final   Special Requests   Final    BOTTLES DRAWN AEROBIC AND ANAEROBIC Blood Culture adequate volume   Culture   Final    NO GROWTH 1 DAY Performed at [facility], [address] [place], [place] [address]    Report Status PENDING  Incomplete  CULTURE, BLOOD (ROUTINE X 2) w Reflex to ID Panel     Status: None (Preliminary result)   Collection Time: 09/01/19  5:01 AM   Specimen: BLOOD  Result Value Ref Range Status   Specimen Description BLOOD LEFT HAN  Final   Special Requests   Final    BOTTLES DRAWN AEROBIC AND ANAEROBIC Blood Culture adequate volume   Culture   Final    NO GROWTH 1 DAY Performed at [facility], [address] [place], [place] [address]    Report Status PENDING  Incomplete  MRSA PCR Screening     Status: None   Collection Time: 09/01/19  1:46 PM   Specimen: Nasal Mucosa; Nasopharyngeal  Result Value Ref Range Status   MRSA by PCR NEGATIVE NEGATIVE Final    Comment:        The GeneXpert MRSA Assay (FDA approved for NASAL specimens only), is one component of a comprehensive MRSA colonization surveillance program.  It is not intended to diagnose MRSA infection nor to  guide or monitor treatment for MRSA infections. Performed at [facility] [address] [place], [place] [address]     Coagulation Studies: No results for input(s): LABPROT, INR in the last 72 hours.  Urinalysis: Recent Labs    09/01/19 0442  COLORURINE YELLOW*  LABSPEC 1.015  PHURINE 9.0*  GLUCOSEU >=500*  HGBUR NEGATIVE  BILIRUBINUR NEGATIVE  KETONESUR NEGATIVE  PROTEINUR >=300*  NITRITE NEGATIVE  LEUKOCYTESUR NEGATIVE      Imaging: DG Chest 1 View  Result Date: 08/31/2019 CLINICAL DATA:  69 year old female with chest and abdominal pain. EXAM: CHEST  1 VIEW; ABDOMEN - 1 VIEW COMPARISON:  Radiograph dated 08/19/2019. FINDINGS: Right-sided dialysis catheter with tip over right atrium. Bilateral confluent airspace opacities, new since the prior radiograph may represent edema but concerning for pneumonia. Clinical correlation recommended. No pleural effusion pneumothorax. Stable cardiomegaly. There is no bowel dilatation or evidence of obstruction. No free air or radiopaque calculi. Osteopenia. No acute osseous pathology. Partially visualized right femoral fixation hardware. IMPRESSION: 1. Bilateral confluent airspace opacities may represent edema but concerning for pneumonia. Clinical correlation is recommended. 2. No bowel obstruction. Electronically Signed   By: Anner Crete M.D.   On: 08/31/2019 23:31   DG Abd 1 View  Result Date: 08/31/2019 CLINICAL DATA:  69 year old female with chest and abdominal pain. EXAM: CHEST  1 VIEW; ABDOMEN - 1 VIEW COMPARISON:  Radiograph dated 08/19/2019. FINDINGS: Right-sided dialysis catheter with tip over right atrium. Bilateral confluent airspace opacities, new since the prior radiograph may represent edema but concerning for pneumonia. Clinical correlation recommended. No pleural effusion pneumothorax. Stable cardiomegaly. There is no bowel dilatation or evidence of obstruction. No free air or radiopaque calculi. Osteopenia. No  acute osseous pathology. Partially visualized right femoral fixation hardware. IMPRESSION: 1. Bilateral confluent airspace opacities may represent edema but concerning for pneumonia. Clinical correlation is recommended. 2. No bowel obstruction. Electronically Signed   By: Anner Crete M.D.   On: 08/31/2019 23:31   DG Chest Port 1 View  Result Date: 09/01/2019 CLINICAL DATA:  Hypoxia EXAM: PORTABLE CHEST 1 VIEW COMPARISON:  08/31/2019, 08/29/2019 FINDINGS: Right-sided central venous catheter tip over the right atrium. Cardiomegaly with extensive bilateral airspace opacities progressed compared to prior radiograph. Increased right pleural effusion. No pneumothorax. IMPRESSION: Cardiomegaly with interval progression of extensive bilateral airspace opacities, edema versus diffuse pneumonia. Increased right-sided pleural effusion. Electronically Signed   By: Donavan Foil M.D.   On: 09/01/2019 21:23   DG Abd Portable 1V  Result Date: 09/01/2019 CLINICAL DATA:  Abdominal pain, possible constipation EXAM: PORTABLE ABDOMEN - 1 VIEW COMPARISON:  08/31/2019 FINDINGS: Scattered large and small bowel gas is noted similar to that seen on the previous day. No obstructive changes are noted. No findings of constipation are seen. No free air is noted. Stable changes in the lung bases are again seen. No acute bony abnormality is noted. Postsurgical changes in the right hip are seen. IMPRESSION: No acute abnormality in the abdomen. The overall appearance is similar to that seen on the previous day. Electronically Signed   By: Inez Catalina M.D.   On: 09/01/2019 18:37     Medications:   . piperacillin-tazobactam (ZOSYN)  IV 10 mL/hr at 09/02/19 0752   . amLODipine  5 mg Oral Daily  . vitamin C  500 mg Oral BID  . budesonide (PULMICORT) nebulizer solution  0.5 mg Nebulization BID  . calcitRIOL  0.25 mcg Oral  Daily  . Chlorhexidine Gluconate Cloth  6 each Topical Daily  . cloNIDine  0.2 mg Transdermal Weekly  .  epoetin (EPOGEN/PROCRIT) injection  4,000 Units Intravenous Q T,Th,Sa-HD  . feeding supplement (NEPRO CARB STEADY)  237 mL Oral BID BM  . furosemide  80 mg Oral QODAY  . heparin  5,000 Units Subcutaneous Q12H  . insulin aspart  0-4 Units Subcutaneous Q4H  . ipratropium-albuterol  3 mL Nebulization Q4H  . losartan  50 mg Oral Daily  . methylPREDNISolone (SOLU-MEDROL) injection  40 mg Intravenous Q12H  . multivitamin  1 tablet Oral QHS  . pantoprazole (PROTONIX) IV  40 mg Intravenous Q24H   acetaminophen **OR** acetaminophen, bisacodyl, hydrALAZINE, HYDROcodone-acetaminophen, ipratropium-albuterol, labetalol, loperamide, magnesium hydroxide, morphine injection, ondansetron **OR** ondansetron (ZOFRAN) IV, simethicone  Assessment/ Plan:  [name] is a 69 y.o. Hispanic female with end stage renal disease on hemodialysis, diabetes mellitus, hypertension, who was admitted 08/16/2019 for Encephalopathy [G93.40] Sepsis ([place]) [A41.9] Fever, unspecified fever cause [R50.9] Altered mental status, unspecified altered mental status type [R41.82]  [facility] ([place]) [facility] Right AVF  1. End stage renal disease: TTS schedule.  Patient due for dialysis treatment today.  Potassium noted to be high at 6.5.  2. Hypertension: Home regimen of amlodipine and furosemide. PRN hydralazine BP Readings from Last 3 Encounters:  09/02/19 121/90  08/14/19 (!) 161/100  07/30/19 (!) 185/79  Blood pressure improved after addition of losartan.  3. Anemia of chronic kidney disease:   -Hemoglobin 9.5.  Received blood transfusion earlier in the admission. Lab Results  Component Value Date   HGB 9.5 (L) 09/02/2019    4. Secondary Hyperparathyroidism of renal origin:    Lab Results  Component Value Date   PTH 269 (H) 03/22/2019   PTH Comment 03/22/2019   CALCIUM 7.7 (L) 09/02/2019   CAION 0.90 (L) 06/09/2019   PHOS 3.7 08/26/2019  Repeat serum phosphorus  today.   5. Encephalopathy:  Evaluated by neurologist 4/30 Depressed mental status is felt to be secondary to metabolic encephalopathy MRI brain no acute changes EEG consistent with severe metabolic encephalopathy Patient a bit more lethargic today.    LOS: 17 Amber Dixon 5/11/202112:30 PM

## 2019-09-02 NOTE — Progress Notes (Signed)
PT Cancellation Note  Patient Details Name: Amber Dixon MRN: [id number] DOB: 1950-09-29   Cancelled Treatment:    Rapid response called yesterday, pt now in CCU; on HFNC also with critically high K+ levels.  Given change in medical status and need for higher level of care will complete PT orders at this time.  Pt has struggled to participate much with PT thus far on this admittance.  Pt will need new PT orders when appropriate.   Kreg Shropshire, DPT 09/02/2019, 8:47 AM

## 2019-09-02 NOTE — Progress Notes (Signed)
Rapid Response Event Note  Overview:pt noted to be on NRB sats in 90's increase  WOB . NP at bedside assessing patient and providing orders.      Initial Focused Assessment:Increase WOB on NRB   Interventions:transferto SD  Plan of Care (if not transferred):  Event Summary:   at  [phone]    at          [facility]

## 2019-09-03 LAB — CBC WITH DIFFERENTIAL/PLATELET
Abs Immature Granulocytes: 0.05 10*3/uL (ref 0.00–0.07)
Basophils Absolute: 0 10*3/uL (ref 0.0–0.1)
Basophils Relative: 0 %
Eosinophils Absolute: 0 10*3/uL (ref 0.0–0.5)
Eosinophils Relative: 0 %
HCT: 24.5 % — ABNORMAL LOW (ref 36.0–46.0)
Hemoglobin: 8.5 g/dL — ABNORMAL LOW (ref 12.0–15.0)
Immature Granulocytes: 0 %
Lymphocytes Relative: 1 %
Lymphs Abs: 0.1 10*3/uL — ABNORMAL LOW (ref 0.7–4.0)
MCH: 28.3 pg (ref 26.0–34.0)
MCHC: 34.7 g/dL (ref 30.0–36.0)
MCV: 81.7 fL (ref 80.0–100.0)
Monocytes Absolute: 0.2 10*3/uL (ref 0.1–1.0)
Monocytes Relative: 2 %
Neutro Abs: 11.8 10*3/uL — ABNORMAL HIGH (ref 1.7–7.7)
Neutrophils Relative %: 97 %
Platelets: 254 10*3/uL (ref 150–400)
RBC: 3 MIL/uL — ABNORMAL LOW (ref 3.87–5.11)
RDW: 16.4 % — ABNORMAL HIGH (ref 11.5–15.5)
WBC: 12.1 10*3/uL — ABNORMAL HIGH (ref 4.0–10.5)
nRBC: 0 % (ref 0.0–0.2)

## 2019-09-03 LAB — BASIC METABOLIC PANEL
Anion gap: 13 (ref 5–15)
BUN: 48 mg/dL — ABNORMAL HIGH (ref 8–23)
CO2: 27 mmol/L (ref 22–32)
Calcium: 7.8 mg/dL — ABNORMAL LOW (ref 8.9–10.3)
Chloride: 90 mmol/L — ABNORMAL LOW (ref 98–111)
Creatinine, Ser: 3.67 mg/dL — ABNORMAL HIGH (ref 0.44–1.00)
GFR calc Af Amer: 14 mL/min — ABNORMAL LOW (ref >60)
GFR calc non Af Amer: 12 mL/min — ABNORMAL LOW (ref >60)
Glucose, Bld: 258 mg/dL — ABNORMAL HIGH (ref 70–99)
Potassium: 4.7 mmol/L (ref 3.5–5.1)
Sodium: 130 mmol/L — ABNORMAL LOW (ref 135–145)

## 2019-09-03 LAB — CULTURE, BLOOD (ROUTINE X 2)
Culture: NO GROWTH
Culture: NO GROWTH
Special Requests: ADEQUATE
Special Requests: ADEQUATE

## 2019-09-03 LAB — GLUCOSE, CAPILLARY
Glucose-Capillary: 258 mg/dL — ABNORMAL HIGH (ref 70–99)
Glucose-Capillary: 208 mg/dL — ABNORMAL HIGH (ref 70–99)
Glucose-Capillary: 337 mg/dL — ABNORMAL HIGH (ref 70–99)
Glucose-Capillary: 246 mg/dL — ABNORMAL HIGH (ref 70–99)
Glucose-Capillary: 179 mg/dL — ABNORMAL HIGH (ref 70–99)
Glucose-Capillary: 135 mg/dL — ABNORMAL HIGH (ref 70–99)

## 2019-09-03 MED ORDER — INSULIN ASPART 100 UNIT/ML ~~LOC~~ SOLN: 0.0000 [IU] | Freq: Every day | SUBCUTANEOUS | Status: DC

## 2019-09-03 MED ORDER — INSULIN ASPART 100 UNIT/ML ~~LOC~~ SOLN
0.0000 [IU] | Freq: Three times a day (TID) | SUBCUTANEOUS | Status: DC
  Administered 2019-09-03: 7 [IU] via SUBCUTANEOUS
  Administered 2019-09-03: 15 [IU] via SUBCUTANEOUS
  Administered 2019-09-04: 3 [IU] via SUBCUTANEOUS
  Administered 2019-09-04: 4 [IU] via SUBCUTANEOUS
  Administered 2019-09-05: 3 [IU] via SUBCUTANEOUS
  Filled 2019-09-03 (×5): qty 1

## 2019-09-03 NOTE — Progress Notes (Signed)
Inpatient Diabetes Program Recommendations  AACE/ADA: New Consensus Statement on Inpatient Glycemic Control (2015)  Target Ranges:  Prepandial:   less than 140 mg/dL      Peak postprandial:   less than 180 mg/dL (1-2 hours)      Critically ill patients:  140 - 180 mg/dL   Lab Results  Component Value Date   GLUCAP 337 (H) 09/03/2019   HGBA1C 5.1 08/17/2019    Review of Glycemic Control Results for Amber Dixon (MRN [id number]) as of 09/03/2019 12:37  Ref. Range 09/02/2019 20:29 09/02/2019 23:40 09/03/2019 04:34 09/03/2019 07:30 09/03/2019 11:28  Glucose-Capillary Latest Ref Range: 70 - 99 mg/dL 179 (H) 293 (H) 258 (H) 208 (H) 337 (H)     Inpatient Diabetes Program Recommendations:    If steroids continue please consider adding meal coverage Novolog 3 units if eats at least 50% and cbg is >80 mg/dl  Thank you, [name], RN, BSN Diabetes Coordinator Inpatient Diabetes Program [phone] (team pager from 8a-5p)

## 2019-09-03 NOTE — Progress Notes (Signed)
Attempted to wean patient to room air. Patient's oxygen saturations 82% on RA, patient placed back on 1L of oxygen, sats increased to low 90's. Amber Dixon

## 2019-09-03 NOTE — Progress Notes (Signed)
Vital signs reviewed, ICU needs resolved  Will sign off at this time. No further recommendations at this time.  Please call [phone] for further questions. Thank you.    Grantley Savage David Aziya Arena, M.D.  [facility]  Medical Director ICU-[facility] [place] Medical Director [facility] Cardio-Pulmonary Department   

## 2019-09-03 NOTE — Progress Notes (Signed)
OT Cancellation Note  Patient Details Name: Amber Dixon MRN: [id number] DOB: [birth year]   Cancelled Treatment:    Reason Eval/Treat Not Completed: Medical issues which prohibited therapy  Rapid response called on 09/01/19 with pt t/f'ed to CCU with low sats, found to have critically high K+ levels on 09/02/19.  Given change in medical status and need for higher level of care will complete OT orders at this time.  Pt with limited tolerance for OT in general t/o this admission. Will require new OT orders should pt become appropriate to resume services. Thank you.  Gerrianne Scale, [place], OTR/L ascom [phone] 09/03/19, 2:58 PM

## 2019-09-03 NOTE — Progress Notes (Signed)
[facility]  ROUNDING NOTE   Subjective:  Patient no longer requiring high flow nasal cannula. Awake and alert this a.m. Serum potassium down to 4.7 at the moment. Underwent dialysis treatment yesterday.   Objective:  Vital signs in last 24 hours:  Temp:  [97.6 F (36.4 C)-98.5 F (36.9 C)] 98.3 F (36.8 C) (05/12 1300) Pulse Rate:  [77-100] 96 (05/12 1300) Resp:  [0-32] 20 (05/12 1300) BP: (105-160)/(61-86) 157/74 (05/12 1300) SpO2:  [89 %-99 %] 96 % (05/12 1300) FiO2 (%):  [33 %-40 %] 33 % (05/11 1553) Weight:  [38.1 kg-38.9 kg] 38.1 kg (05/12 0500)  Weight change: 0 kg Filed Weights   09/02/19 0510 09/02/19 1637 09/03/19 0500  Weight: 38.9 kg 38.9 kg 38.1 kg    Intake/Output: I/O last 3 completed shifts: In: 487.8 [P.O.:240; IV Piggyback:247.8] Out: 2550 [Urine:50; Other:2500]   Intake/Output this shift:  Total I/O In: 140 [P.O.:140] Out: -   Physical Exam: General: NAD, laying in bed  Head: Normocephalic, atraumatic. Moist oral mucosal membranes  Eyes: Anicteric  Lungs:  Basilar rales, normal effort  Heart: Regular rate and rhythm  Abdomen:  Soft, distention noted.  Extremities: no peripheral edema.  Neurologic: Lethargic  Skin: No lesions  Access: Right IJ PermCath    Basic Metabolic Panel: Recent Labs  Lab 08/29/19 0602 08/29/19 0602 09/01/19 0210 09/02/19 0520 09/03/19 0600  NA 136  --  132* 127* 130*  K 4.7  --  4.7 6.5* 4.7  CL 94*  --  89* 86* 90*  CO2 33*  --  30 28 27   GLUCOSE 104*  --  149* 155* 258*  BUN 33*  --  46* 63* 48*  CREATININE 2.80*  --  3.24* 4.70* 3.67*  CALCIUM 7.8*   < > 7.8* 7.7* 7.8*   < > = values in this interval not displayed.    Liver Function Tests: Recent Labs  Lab 09/01/19 0210  AST 36  ALT 31  ALKPHOS 99  BILITOT 0.4  PROT 6.1*  ALBUMIN 2.7*   Recent Labs  Lab 09/01/19 0210  LIPASE 40   No results for input(s): AMMONIA in the last 168 hours.  CBC: Recent Labs  Lab  08/30/19 0449 08/30/19 1434 08/31/19 0543 08/31/19 0543 08/31/19 1729 09/01/19 0210 09/01/19 0501 09/02/19 0520 09/03/19 0600  WBC 7.9  --  8.0  --   --  15.8* 15.0* 12.7* 12.1*  NEUTROABS 5.3  --   --   --   --   --   --  12.3* 11.8*  HGB 7.1*   < > 6.8*   < > 9.0* 8.5* 8.3* 9.5* 8.5*  HCT 22.3*   < > 21.5*   < > 27.7* 25.8* 24.4* 27.7* 24.5*  MCV 84.8  --  85.3  --   --  84.9 83.0 82.2 81.7  PLT 303  --  288  --   --  284 268 260 254   < > = values in this interval not displayed.    Cardiac Enzymes: No results for input(s): CKTOTAL, CKMB, CKMBINDEX, TROPONINI in the last 168 hours.  BNP: Invalid input(s): POCBNP  CBG: Recent Labs  Lab 09/02/19 2029 09/02/19 2340 09/03/19 0434 09/03/19 0730 09/03/19 1128  GLUCAP 179* 293* 258* 208* 37*    Microbiology: Results for orders placed or performed during the hospital encounter of 08/16/19  Urine culture     Status: None   Collection Time: 08/16/19  8:23 PM   Specimen:  In/Out Cath Urine  Result Value Ref Range Status   Specimen Description   Final    IN/OUT CATH URINE Performed at [facility], [address] [place], [place] [address]    Special Requests   Final    NONE Performed at [facility], [address], [place], [place] [address]    Culture   Final    NO GROWTH Performed at [facility], [place] [address] [place], [place] [address]    Report Status 08/18/2019 FINAL  Final  Blood Culture (routine x 2)     Status: None   Collection Time: 08/16/19  8:29 PM   Specimen: BLOOD  Result Value Ref Range Status   Specimen Description BLOOD BLOOD LEFT FOREARM  Final   Special Requests   Final    BOTTLES DRAWN AEROBIC AND ANAEROBIC Blood Culture adequate volume   Culture   Final    NO GROWTH 5 DAYS Performed at [facility], [address] [place], [place] [address]    Report Status 08/21/2019 FINAL  Final  Blood Culture (routine x 2)     Status: None   Collection Time:  08/16/19  8:29 PM   Specimen: BLOOD  Result Value Ref Range Status   Specimen Description BLOOD BLOOD LEFT ARM  Final   Special Requests   Final    BOTTLES DRAWN AEROBIC AND ANAEROBIC Blood Culture results may not be optimal due to an inadequate volume of blood received in culture bottles   Culture   Final    NO GROWTH 5 DAYS Performed at [facility], [address] [place], [place] [address]    Report Status 08/21/2019 FINAL  Final  Respiratory Panel by RT PCR (Flu A&B, Covid) - Urine, Clean Catch     Status: None   Collection Time: 08/16/19  8:47 PM   Specimen: Urine, Clean Catch  Result Value Ref Range Status   SARS Coronavirus 2 by RT PCR NEGATIVE NEGATIVE Final    Comment: (NOTE) SARS-CoV-2 target nucleic acids are NOT DETECTED. The SARS-CoV-2 RNA is generally detectable in upper respiratoy specimens during the acute phase of infection. The lowest concentration of SARS-CoV-2 viral copies this assay can detect is 131 copies/mL. A negative result does not preclude SARS-Cov-2 infection and should not be used as the sole basis for treatment or other patient management decisions. A negative result may occur with  improper specimen collection/handling, submission of specimen other than nasopharyngeal swab, presence of viral mutation(s) within the areas targeted by this assay, and inadequate number of viral copies (<131 copies/mL). A negative result must be combined with clinical observations, patient history, and epidemiological information. The expected result is Negative. Fact Sheet for Patients:  PinkCheek.be Fact Sheet for Healthcare Providers:  GravelBags.it This test is not yet ap proved or cleared by the [place] FDA and  has been authorized for detection and/or diagnosis of SARS-CoV-2 by FDA under an Emergency Use Authorization (EUA). This EUA will remain  in effect (meaning this test can be used)  for the duration of the COVID-19 declaration under Section 564(b)(1) of the Act, 21 U.S.C. section 360bbb-3(b)(1), unless the authorization is terminated or revoked sooner.    Influenza A by PCR NEGATIVE NEGATIVE Final   Influenza B by PCR NEGATIVE NEGATIVE Final    Comment: (NOTE) The Xpert Xpress SARS-CoV-2/FLU/RSV assay is intended as an aid in  the diagnosis of influenza from Nasopharyngeal swab specimens and  should not be used as  a sole basis for treatment. Nasal washings and  aspirates are unacceptable for Xpert Xpress SARS-CoV-2/FLU/RSV  testing. Fact Sheet for Patients: PinkCheek.be Fact Sheet for Healthcare Providers: GravelBags.it This test is not yet approved or cleared by the [place] FDA and  has been authorized for detection and/or diagnosis of SARS-CoV-2 by  FDA under an Emergency Use Authorization (EUA). This EUA will remain  in effect (meaning this test can be used) for the duration of the  Covid-19 declaration under Section 564(b)(1) of the Act, 21  U.S.C. section 360bbb-3(b)(1), unless the authorization is  terminated or revoked. Performed at [facility], [address] [place], [place] [address]   MRSA PCR Screening     Status: None   Collection Time: 08/26/19  4:00 AM   Specimen: Nasal Mucosa; Nasopharyngeal  Result Value Ref Range Status   MRSA by PCR NEGATIVE NEGATIVE Final    Comment:        The GeneXpert MRSA Assay (FDA approved for NASAL specimens only), is one component of a comprehensive MRSA colonization surveillance program. It is not intended to diagnose MRSA infection nor to guide or monitor treatment for MRSA infections. Performed at [facility], [address] [place], [place] [address]   CULTURE, BLOOD (ROUTINE X 2) w Reflex to ID Panel     Status: None   Collection Time: 08/29/19  8:37 AM   Specimen: BLOOD  Result Value Ref Range Status   Specimen  Description BLOOD LEFT ANTECUBITAL  Final   Special Requests   Final    BOTTLES DRAWN AEROBIC AND ANAEROBIC Blood Culture adequate volume   Culture   Final    NO GROWTH 5 DAYS Performed at [facility], [address] [place], [place] [address]    Report Status 09/03/2019 FINAL  Final  CULTURE, BLOOD (ROUTINE X 2) w Reflex to ID Panel     Status: None   Collection Time: 08/29/19  8:37 AM   Specimen: BLOOD  Result Value Ref Range Status   Specimen Description BLOOD BLOOD LEFT HAND  Final   Special Requests   Final    BOTTLES DRAWN AEROBIC AND ANAEROBIC Blood Culture adequate volume   Culture   Final    NO GROWTH 5 DAYS Performed at [facility], [address] [place], [place] [address]    Report Status 09/03/2019 FINAL  Final  CULTURE, BLOOD (ROUTINE X 2) w Reflex to ID Panel     Status: None (Preliminary result)   Collection Time: 09/01/19  5:01 AM   Specimen: BLOOD  Result Value Ref Range Status   Specimen Description BLOOD LEFT [facility]  Final   Special Requests   Final    BOTTLES DRAWN AEROBIC AND ANAEROBIC Blood Culture adequate volume   Culture   Final    NO GROWTH 2 DAYS Performed at [facility], [address], [place], [place] [address]    Report Status PENDING  Incomplete  CULTURE, BLOOD (ROUTINE X 2) w Reflex to ID Panel     Status: None (Preliminary result)   Collection Time: 09/01/19  5:01 AM   Specimen: BLOOD  Result Value Ref Range Status   Specimen Description BLOOD LEFT HAN  Final   Special Requests   Final    BOTTLES DRAWN AEROBIC AND ANAEROBIC Blood Culture adequate volume   Culture   Final    NO GROWTH 2 DAYS Performed at [facility], [address], [place], [place] [address]  Report Status PENDING  Incomplete  MRSA PCR Screening     Status: None   Collection Time: 09/01/19  1:46 PM   Specimen: Nasal Mucosa; Nasopharyngeal  Result Value Ref Range Status   MRSA by PCR NEGATIVE NEGATIVE Final    Comment:         The GeneXpert MRSA Assay (FDA approved for NASAL specimens only), is one component of a comprehensive MRSA colonization surveillance program. It is not intended to diagnose MRSA infection nor to guide or monitor treatment for MRSA infections. Performed at [facility], [address] [place], [place] [address]     Coagulation Studies: No results for input(s): LABPROT, INR in the last 72 hours.  Urinalysis: Recent Labs    09/01/19 0442  COLORURINE YELLOW*  LABSPEC 1.015  PHURINE 9.0*  GLUCOSEU >=500*  HGBUR NEGATIVE  BILIRUBINUR NEGATIVE  KETONESUR NEGATIVE  PROTEINUR >=300*  NITRITE NEGATIVE  LEUKOCYTESUR NEGATIVE      Imaging: DG Chest Port 1 View  Result Date: 09/01/2019 CLINICAL DATA:  Hypoxia EXAM: PORTABLE CHEST 1 VIEW COMPARISON:  08/31/2019, 08/29/2019 FINDINGS: Right-sided central venous catheter tip over the right atrium. Cardiomegaly with extensive bilateral airspace opacities progressed compared to prior radiograph. Increased right pleural effusion. No pneumothorax. IMPRESSION: Cardiomegaly with interval progression of extensive bilateral airspace opacities, edema versus diffuse pneumonia. Increased right-sided pleural effusion. Electronically Signed   By: Donavan Foil M.D.   On: 09/01/2019 21:23   DG Abd Portable 1V  Result Date: 09/01/2019 CLINICAL DATA:  Abdominal pain, possible constipation EXAM: PORTABLE ABDOMEN - 1 VIEW COMPARISON:  08/31/2019 FINDINGS: Scattered large and small bowel gas is noted similar to that seen on the previous day. No obstructive changes are noted. No findings of constipation are seen. No free air is noted. Stable changes in the lung bases are again seen. No acute bony abnormality is noted. Postsurgical changes in the right hip are seen. IMPRESSION: No acute abnormality in the abdomen. The overall appearance is similar to that seen on the previous day. Electronically Signed   By: Inez Catalina M.D.   On: 09/01/2019 18:37      Medications:   . piperacillin-tazobactam (ZOSYN)  IV 2.25 g (09/03/19 0520)   . amLODipine  5 mg Oral Daily  . vitamin C  500 mg Oral BID  . budesonide (PULMICORT) nebulizer solution  0.5 mg Nebulization BID  . calcitRIOL  0.25 mcg Oral Daily  . Chlorhexidine Gluconate Cloth  6 each Topical Daily  . cloNIDine  0.2 mg Transdermal Weekly  . epoetin (EPOGEN/PROCRIT) injection  4,000 Units Intravenous Q T,Th,Sa-HD  . feeding supplement (NEPRO CARB STEADY)  237 mL Oral BID BM  . furosemide  80 mg Oral QODAY  . heparin  5,000 Units Subcutaneous Q12H  . insulin aspart  0-20 Units Subcutaneous TID WC  . insulin aspart  0-5 Units Subcutaneous QHS  . ipratropium-albuterol  3 mL Nebulization Q4H  . losartan  50 mg Oral Daily  . multivitamin  1 tablet Oral QHS   acetaminophen **OR** acetaminophen, bisacodyl, hydrALAZINE, HYDROcodone-acetaminophen, ipratropium-albuterol, labetalol, loperamide, magnesium hydroxide, morphine injection, ondansetron **OR** ondansetron (ZOFRAN) IV, simethicone  Assessment/ Plan:  Amber Dixon is a 69 y.o. Hispanic female with end stage renal disease on hemodialysis, diabetes mellitus, hypertension, who was admitted 08/16/2019 for Encephalopathy [G93.40] Sepsis ([place]) [A41.9] Fever, unspecified fever cause [R50.9] Altered mental status, unspecified altered mental status type [R41.82]  [facility] ([place]) [facility] Right AVF  1. End  stage renal disease: TTS schedule.  Patient underwent dialysis treatment yesterday.  No acute indication for dialysis today as potassium has improved.  2. Hypertension: Home regimen of amlodipine and furosemide. PRN hydralazine BP Readings from Last 3 Encounters:  09/03/19 (!) 157/74  08/14/19 (!) 161/100  07/30/19 (!) 185/79  Maintain the patient amlodipine, clonidine, losartan.  3. Anemia of chronic kidney disease:   -Maintain the patient on Epogen 4000 units IV with dialysis  treatments. Lab Results  Component Value Date   HGB 8.5 (L) 09/03/2019    4. Secondary Hyperparathyroidism of renal origin:    Lab Results  Component Value Date   PTH 269 (H) 03/22/2019   PTH Comment 03/22/2019   CALCIUM 7.8 (L) 09/03/2019   CAION 0.90 (L) 06/09/2019   PHOS 3.7 08/26/2019  Serum phosphorus under good control.   5. Encephalopathy:  Evaluated by neurologist 4/30 Depressed mental status is felt to be secondary to metabolic encephalopathy MRI brain no acute changes EEG consistent with severe metabolic encephalopathy Patient a bit more lethargic today.    LOS: 18 Amber Dixon 5/12/20212:03 PM

## 2019-09-03 NOTE — Progress Notes (Addendum)
PROGRESS NOTE    Amber Dixon  JIR:[id number] DOB: [birth year] DOA: 08/16/2019 PCP: Patient, No Pcp Per      Brief Narrative:  Mrs. Amber Dixon is a 69 y.o. F with HTN, DM, visiting from [place] and developed ESRD last fall who presented with AMS and decreased responsiveness over several days.  In the ER, BP 216/111 and febrile to 102.28F, HR 120, confused, lactate 2.  Started on antibiotics and admitted.  Intracranial imaging normal, encephalopathy resolved with BP control.  No source of infection found, sepsis ruled out.  5/10: Patient eating strawberries from family, aspirated, required transfer to ICU and HFNC at 40lpm.           Assessment & Plan:  Acute hypoxic respiratory failure due to aspiration This developed 5/10 after aspiration of food.  It is improving. -Dysphagia 1 diet -SLP consult -Continue Zosyn, day 3 of 5    Acute metabolic encephalopathy This was the chief presenting complaint, this is now resolved with blood pressure control, dialysis.  End-stage renal disease -Consult nephrology for routine dialysis  Diabetes Glucose is elevated -Start sliding scale correction insulin with meals  Hypertensive emergency Blood pressure controlled mostly -Continue amlodipine, clonidine, furosemide, losartan -Stop steroids  Anemia of chronic kidney disease Hemoglobin roughly stable, no clinical bleeding.  Severe protein calorie malnutrition  -Nutrition consult         Disposition: Status is: Inpatient  Remains inpatient appropriate because:requires ongoing supplemental O2, ongoing Iv antibiotics due to severity of illness (aspiration pneumonia in setting of ESRD requiring high flow O2)   Dispo: The patient is from: Home              Anticipated d/c is to: TBD              Anticipated d/c date is: 2 days              Patient currently is not medically stable to d/c.    We will need to wean off O2 and arrange for [facility].   Likely home in 1-2 days.           MDM: The below labs and imaging reports were reviewed and summarized above.  Medication management as above. This is a severe illness with threat to life or bodily function.   DVT prophylaxis: Heparin Code Status: FULL Family Communication: None present    Consultants:   Nephrology  Neurology  Palliative care  Critical care  Procedures:   4/28 EEG: Slowing, consistent with severe encephalopathic state, etiology nonspecific  Antimicrobials:   Vancomycin/Zosyn x1 4/24  Cefepime/Flagyl 4/25>>4/28  Vancomycin x1 5/9  Zosyn 5/9>>  Culture data:   4/24 blood culture x2-no growth to date  4/24 urine culture-no growth          Subjective: All history collected throug in person interpreter.  Patient feels fine.  She is hungry.  She wants to go home.  No dyspnea, cough, malaise, nausea.  Abdominal pain/cramps are gone.   She is oriented to "hospital", "Amber Dixon", self.  She is not oriented to year, but mostly oriented to situation.      Objective: Vitals:   09/03/19 0500 09/03/19 0800 09/03/19 0900 09/03/19 1000  BP:  (!) 154/73 (!) 147/67 (!) 146/72  Pulse: 92 82 96 93  Resp: (!) 27 (!) 24 16 17   Temp:  97.6 F (36.4 C)    TempSrc:  Oral    SpO2: 95% 98% 99% 98%  Weight: 38.1 kg  Height:        Intake/Output Summary (Last 24 hours) at 09/03/2019 1206 Last data filed at 09/03/2019 0935 Gross per 24 hour  Intake 474.37 ml  Output 2550 ml  Net -2075.63 ml   Filed Weights   09/02/19 0510 09/02/19 1637 09/03/19 0500  Weight: 38.9 kg 38.9 kg 38.1 kg    Examination: General appearance: diminutive adult female, alert and in no acute distress.   HEENT: Anicteric, conjunctiva pink, lids and lashes normal. No nasal deformity, discharge, epistaxis.  Lips moist, edentulous, no oral lesions, OP moist, hearing seems normal.   Skin: Warm and dry.   No suspicious rashes or lesions. Cardiac: RRR, nl S1-S2,  no murmurs appreciated.  Capillary refill is brisk.  JVP not visible.  No LE edema.  Radial pulses 2+ and symmetric. Respiratory: Normal respiratory rate and rhythm.  CTAB without rales or wheezes. Abdomen: Abdomen soft.  No TTP or guarding. No ascites, distension, hepatosplenomegaly.   MSK: No deformities or effusions. Diffuse loss of subcutaneous muscle mass and fat. Neuro: Awake and alert.  EOMI, moves all extremities. Speech fluent.    Psych: Sensorium intact and responding to questions, attention normal. Affect normal.  Judgment and insight appear normal, maybe limited by literacy.    Data Reviewed: I have personally reviewed following labs and imaging studies:  CBC: Recent Labs  Lab 08/30/19 0449 08/30/19 1434 08/31/19 0543 08/31/19 0543 08/31/19 1729 09/01/19 0210 09/01/19 0501 09/02/19 0520 09/03/19 0600  WBC 7.9  --  8.0  --   --  15.8* 15.0* 12.7* 12.1*  NEUTROABS 5.3  --   --   --   --   --   --  12.3* 11.8*  HGB 7.1*   < > 6.8*   < > 9.0* 8.5* 8.3* 9.5* 8.5*  HCT 22.3*   < > 21.5*   < > 27.7* 25.8* 24.4* 27.7* 24.5*  MCV 84.8  --  85.3  --   --  84.9 83.0 82.2 81.7  PLT 303  --  288  --   --  284 268 260 254   < > = values in this interval not displayed.   Basic Metabolic Panel: Recent Labs  Lab 08/29/19 0602 09/01/19 0210 09/02/19 0520 09/03/19 0600  NA 136 132* 127* 130*  K 4.7 4.7 6.5* 4.7  CL 94* 89* 86* 90*  CO2 33* 30 28 27   GLUCOSE 104* 149* 155* 258*  BUN 33* 46* 63* 48*  CREATININE 2.80* 3.24* 4.70* 3.67*  CALCIUM 7.8* 7.8* 7.7* 7.8*   GFR: Estimated Creatinine Clearance: 8.6 mL/min (A) (by C-G formula based on SCr of 3.67 mg/dL (H)). Liver Function Tests: Recent Labs  Lab 09/01/19 0210  AST 36  ALT 31  ALKPHOS 99  BILITOT 0.4  PROT 6.1*  ALBUMIN 2.7*   Recent Labs  Lab 09/01/19 0210  LIPASE 40   No results for input(s): AMMONIA in the last 168 hours. Coagulation Profile: No results for input(s): INR, PROTIME in the last 168  hours. Cardiac Enzymes: No results for input(s): CKTOTAL, CKMB, CKMBINDEX, TROPONINI in the last 168 hours. BNP (last 3 results) No results for input(s): PROBNP in the last 8760 hours. HbA1C: No results for input(s): HGBA1C in the last 72 hours. CBG: Recent Labs  Lab 09/02/19 2029 09/02/19 2340 09/03/19 0434 09/03/19 0730 09/03/19 1128  GLUCAP 179* 293* 258* 208* 337*   Lipid Profile: No results for input(s): CHOL, HDL, LDLCALC, TRIG, CHOLHDL, LDLDIRECT in the last 72 hours.  Thyroid Function Tests: No results for input(s): TSH, T4TOTAL, FREET4, T3FREE, THYROIDAB in the last 72 hours. Anemia Panel: No results for input(s): VITAMINB12, FOLATE, FERRITIN, TIBC, IRON, RETICCTPCT in the last 72 hours. Urine analysis:    Component Value Date/Time   COLORURINE YELLOW (A) 09/01/2019 0442   APPEARANCEUR HAZY (A) 09/01/2019 0442   LABSPEC 1.015 09/01/2019 0442   PHURINE 9.0 (H) 09/01/2019 0442   GLUCOSEU >=500 (A) 09/01/2019 0442   HGBUR NEGATIVE 09/01/2019 0442   BILIRUBINUR NEGATIVE 09/01/2019 0442   KETONESUR NEGATIVE 09/01/2019 0442   PROTEINUR >=300 (A) 09/01/2019 0442   NITRITE NEGATIVE 09/01/2019 0442   LEUKOCYTESUR NEGATIVE 09/01/2019 0442   Sepsis Labs: @LABRCNTIP (procalcitonin:4,lacticacidven:4)  ) Recent Results (from the past 240 hour(s))  MRSA PCR Screening     Status: None   Collection Time: 08/26/19  4:00 AM   Specimen: Nasal Mucosa; Nasopharyngeal  Result Value Ref Range Status   MRSA by PCR NEGATIVE NEGATIVE Final    Comment:        The GeneXpert MRSA Assay (FDA approved for NASAL specimens only), is one component of a comprehensive MRSA colonization surveillance program. It is not intended to diagnose MRSA infection nor to guide or monitor treatment for MRSA infections. Performed at [facility], [address] [place], [place] [address]   CULTURE, BLOOD (ROUTINE X 2) w Reflex to ID Panel     Status: None   Collection Time: 08/29/19  8:37  AM   Specimen: BLOOD  Result Value Ref Range Status   Specimen Description BLOOD LEFT ANTECUBITAL  Final   Special Requests   Final    BOTTLES DRAWN AEROBIC AND ANAEROBIC Blood Culture adequate volume   Culture   Final    NO GROWTH 5 DAYS Performed at [facility], [address] [place], [place] [address]    Report Status 09/03/2019 FINAL  Final  CULTURE, BLOOD (ROUTINE X 2) w Reflex to ID Panel     Status: None   Collection Time: 08/29/19  8:37 AM   Specimen: BLOOD  Result Value Ref Range Status   Specimen Description BLOOD BLOOD LEFT HAND  Final   Special Requests   Final    BOTTLES DRAWN AEROBIC AND ANAEROBIC Blood Culture adequate volume   Culture   Final    NO GROWTH 5 DAYS Performed at [facility], [address] [place], [place] [address]    Report Status 09/03/2019 FINAL  Final  CULTURE, BLOOD (ROUTINE X 2) w Reflex to ID Panel     Status: None (Preliminary result)   Collection Time: 09/01/19  5:01 AM   Specimen: BLOOD  Result Value Ref Range Status   Specimen Description BLOOD LEFT [facility]  Final   Special Requests   Final    BOTTLES DRAWN AEROBIC AND ANAEROBIC Blood Culture adequate volume   Culture   Final    NO GROWTH 2 DAYS Performed at [facility], [address], [place], [place] [address]    Report Status PENDING  Incomplete  CULTURE, BLOOD (ROUTINE X 2) w Reflex to ID Panel     Status: None (Preliminary result)   Collection Time: 09/01/19  5:01 AM   Specimen: BLOOD  Result Value Ref Range Status   Specimen Description BLOOD LEFT HAN  Final   Special Requests   Final    BOTTLES DRAWN AEROBIC AND ANAEROBIC Blood Culture adequate volume   Culture   Final    NO GROWTH 2 DAYS Performed at  [facility], [address], [place], [place] [address]    Report Status PENDING  Incomplete  MRSA PCR Screening     Status: None   Collection Time: 09/01/19  1:46 PM   Specimen: Nasal Mucosa; Nasopharyngeal  Result Value Ref  Range Status   MRSA by PCR NEGATIVE NEGATIVE Final    Comment:        The GeneXpert MRSA Assay (FDA approved for NASAL specimens only), is one component of a comprehensive MRSA colonization surveillance program. It is not intended to diagnose MRSA infection nor to guide or monitor treatment for MRSA infections. Performed at [facility], [address] [place], [place] [address]          Radiology Studies: DG Chest Marks 1 View  Result Date: 09/01/2019 CLINICAL DATA:  Hypoxia EXAM: PORTABLE CHEST 1 VIEW COMPARISON:  08/31/2019, 08/29/2019 FINDINGS: Right-sided central venous catheter tip over the right atrium. Cardiomegaly with extensive bilateral airspace opacities progressed compared to prior radiograph. Increased right pleural effusion. No pneumothorax. IMPRESSION: Cardiomegaly with interval progression of extensive bilateral airspace opacities, edema versus diffuse pneumonia. Increased right-sided pleural effusion. Electronically Signed   By: Donavan Foil M.D.   On: 09/01/2019 21:23   DG Abd Portable 1V  Result Date: 09/01/2019 CLINICAL DATA:  Abdominal pain, possible constipation EXAM: PORTABLE ABDOMEN - 1 VIEW COMPARISON:  08/31/2019 FINDINGS: Scattered large and small bowel gas is noted similar to that seen on the previous day. No obstructive changes are noted. No findings of constipation are seen. No free air is noted. Stable changes in the lung bases are again seen. No acute bony abnormality is noted. Postsurgical changes in the right hip are seen. IMPRESSION: No acute abnormality in the abdomen. The overall appearance is similar to that seen on the previous day. Electronically Signed   By: Inez Catalina M.D.   On: 09/01/2019 18:37        Scheduled Meds: . amLODipine  5 mg Oral Daily  . vitamin C  500 mg Oral BID  . budesonide (PULMICORT) nebulizer solution  0.5 mg Nebulization BID  . calcitRIOL  0.25 mcg Oral Daily  . Chlorhexidine Gluconate Cloth  6 each  Topical Daily  . cloNIDine  0.2 mg Transdermal Weekly  . epoetin (EPOGEN/PROCRIT) injection  4,000 Units Intravenous Q T,Th,Sa-HD  . feeding supplement (NEPRO CARB STEADY)  237 mL Oral BID BM  . furosemide  80 mg Oral QODAY  . heparin  5,000 Units Subcutaneous Q12H  . insulin aspart  0-20 Units Subcutaneous TID WC  . insulin aspart  0-5 Units Subcutaneous QHS  . ipratropium-albuterol  3 mL Nebulization Q4H  . losartan  50 mg Oral Daily  . methylPREDNISolone (SOLU-MEDROL) injection  40 mg Intravenous Q12H  . multivitamin  1 tablet Oral QHS  . pantoprazole (PROTONIX) IV  40 mg Intravenous Q24H   Continuous Infusions: . piperacillin-tazobactam (ZOSYN)  IV 2.25 g (09/03/19 0520)     LOS: 18 days    Time spent: 35 minutes    Edwin Dada, MD [facility] 09/03/2019, 12:06 PM     Please page though [place] or Epic secure chat:  For [employer], Adult nurse

## 2019-09-04 ENCOUNTER — Inpatient Hospital Stay: Payer: Medicaid Other

## 2019-09-04 LAB — GLUCOSE, CAPILLARY
Glucose-Capillary: 126 mg/dL — ABNORMAL HIGH (ref 70–99)
Glucose-Capillary: 143 mg/dL — ABNORMAL HIGH (ref 70–99)
Glucose-Capillary: 145 mg/dL — ABNORMAL HIGH (ref 70–99)
Glucose-Capillary: 192 mg/dL — ABNORMAL HIGH (ref 70–99)
Glucose-Capillary: 98 mg/dL (ref 70–99)

## 2019-09-04 LAB — CBC
HCT: 24.2 % — ABNORMAL LOW (ref 36.0–46.0)
Hemoglobin: 8 g/dL — ABNORMAL LOW (ref 12.0–15.0)
MCH: 27.8 pg (ref 26.0–34.0)
MCHC: 33.1 g/dL (ref 30.0–36.0)
MCV: 84 fL (ref 80.0–100.0)
Platelets: 275 10*3/uL (ref 150–400)
RBC: 2.88 MIL/uL — ABNORMAL LOW (ref 3.87–5.11)
RDW: 16.4 % — ABNORMAL HIGH (ref 11.5–15.5)
WBC: 12.2 10*3/uL — ABNORMAL HIGH (ref 4.0–10.5)
nRBC: 0 % (ref 0.0–0.2)

## 2019-09-04 LAB — RENAL FUNCTION PANEL
Albumin: 2.7 g/dL — ABNORMAL LOW (ref 3.5–5.0)
Anion gap: 14 (ref 5–15)
BUN: 72 mg/dL — ABNORMAL HIGH (ref 8–23)
CO2: 27 mmol/L (ref 22–32)
Calcium: 7.5 mg/dL — ABNORMAL LOW (ref 8.9–10.3)
Chloride: 90 mmol/L — ABNORMAL LOW (ref 98–111)
Creatinine, Ser: 4.51 mg/dL — ABNORMAL HIGH (ref 0.44–1.00)
GFR calc Af Amer: 11 mL/min — ABNORMAL LOW (ref >60)
GFR calc non Af Amer: 9 mL/min — ABNORMAL LOW (ref >60)
Glucose, Bld: 138 mg/dL — ABNORMAL HIGH (ref 70–99)
Phosphorus: 6.7 mg/dL — ABNORMAL HIGH (ref 2.5–4.6)
Potassium: 5.6 mmol/L — ABNORMAL HIGH (ref 3.5–5.1)
Sodium: 131 mmol/L — ABNORMAL LOW (ref 135–145)

## 2019-09-04 MED ORDER — POLYETHYLENE GLYCOL 3350 17 G PO PACK: 17.0000 g | PACK | Freq: Two times a day (BID) | ORAL | Status: DC | PRN

## 2019-09-04 MED ORDER — IPRATROPIUM-ALBUTEROL 0.5-2.5 (3) MG/3ML IN SOLN
3.0000 mL | Freq: Four times a day (QID) | RESPIRATORY_TRACT | Status: DC
  Administered 2019-09-04: 3 mL via RESPIRATORY_TRACT
  Filled 2019-09-04: qty 3

## 2019-09-04 NOTE — Progress Notes (Signed)
Amber Dixon  MRN: [id number]  DOB/AGE: 1950-04-30 69 y.o.  Primary Care Physician:Patient, No Pcp Per  Admit date: 08/16/2019  Chief Complaint:  Chief Complaint  Patient presents with  . Blood Infection  . Altered Mental Status    S-Pt presented on  08/16/2019 with  Chief Complaint  Patient presents with  . Blood Infection  . Altered Mental Status  . Patient seen on dialysis. Patient tolerating treatment well  Medications none   . amLODipine  5 mg Oral Daily  . vitamin C  500 mg Oral BID  . budesonide (PULMICORT) nebulizer solution  0.5 mg Nebulization BID  . calcitRIOL  0.25 mcg Oral Daily  . Chlorhexidine Gluconate Cloth  6 each Topical Daily  . cloNIDine  0.2 mg Transdermal Weekly  . epoetin (EPOGEN/PROCRIT) injection  4,000 Units Intravenous Q T,Th,Sa-HD  . feeding supplement (NEPRO CARB STEADY)  237 mL Oral BID BM  . furosemide  80 mg Oral QODAY  . heparin  5,000 Units Subcutaneous Q12H  . insulin aspart  0-20 Units Subcutaneous TID WC  . insulin aspart  0-5 Units Subcutaneous QHS  . ipratropium-albuterol  3 mL Nebulization QID  . losartan  50 mg Oral Daily  . multivitamin  1 tablet Oral QHS         QMG:QQPYP from the symptoms mentioned above,there are no other symptoms referable to all systems reviewed.  Physical Exam: Vital signs in last 24 hours: Temp:  [97.8 F (36.6 C)-99 F (37.2 C)] 97.9 F (36.6 C) (05/13 0830) Pulse Rate:  [73-97] 92 (05/13 0830) Resp:  [13-28] 16 (05/13 0718) BP: (105-163)/(47-108) 128/89 (05/13 0830) SpO2:  [82 %-100 %] 93 % (05/13 0837) Weight:  [37.9 kg] 37.9 kg (05/13 0535) Weight change: -1 kg Last BM Date: 09/03/19  Intake/Output from previous day: 05/12 0701 - 05/13 0700 In: 140 [P.O.:140] Out: 0  No intake/output data recorded.   Physical Exam: General- pt is awake,alert, oriented to time place and person Resp- No acute REsp distress, CTA B/L NO Rhonchi CVS- S1S2 regular in rate and  rhythm GIT- BS+, soft, NT, ND EXT- NO LE Edema, Cyanosis Access patient has right AV fistula  Lab Results: CBC Recent Labs    09/03/19 0600 09/04/19 0519  WBC 12.1* 12.2*  HGB 8.5* 8.0*  HCT 24.5* 24.2*  PLT 254 275    BMET Recent Labs    09/03/19 0600 09/04/19 0519  NA 130* 131*  K 4.7 5.6*  CL 90* 90*  CO2 27 27  GLUCOSE 258* 138*  BUN 48* 72*  CREATININE 3.67* 4.51*  CALCIUM 7.8* 7.5*    MICRO Recent Results (from the past 240 hour(s))  MRSA PCR Screening     Status: None   Collection Time: 08/26/19  4:00 AM   Specimen: Nasal Mucosa; Nasopharyngeal  Result Value Ref Range Status   MRSA by PCR NEGATIVE NEGATIVE Final    Comment:        The GeneXpert MRSA Assay (FDA approved for NASAL specimens only), is one component of a comprehensive MRSA colonization surveillance program. It is not intended to diagnose MRSA infection nor to guide or monitor treatment for MRSA infections. Performed at [facility], [address] [place], [place] [address]   CULTURE, BLOOD (ROUTINE X 2) w Reflex to ID Panel     Status: None   Collection Time: 08/29/19  8:37 AM   Specimen: BLOOD  Result Value Ref Range Status   Specimen Description BLOOD LEFT  ANTECUBITAL  Final   Special Requests   Final    BOTTLES DRAWN AEROBIC AND ANAEROBIC Blood Culture adequate volume   Culture   Final    NO GROWTH 5 DAYS Performed at [facility], [address] [place], [place] [address]    Report Status 09/03/2019 FINAL  Final  CULTURE, BLOOD (ROUTINE X 2) w Reflex to ID Panel     Status: None   Collection Time: 08/29/19  8:37 AM   Specimen: BLOOD  Result Value Ref Range Status   Specimen Description BLOOD BLOOD LEFT HAND  Final   Special Requests   Final    BOTTLES DRAWN AEROBIC AND ANAEROBIC Blood Culture adequate volume   Culture   Final    NO GROWTH 5 DAYS Performed at [facility], [address] [place], [place] [address]    Report Status  09/03/2019 FINAL  Final  CULTURE, BLOOD (ROUTINE X 2) w Reflex to ID Panel     Status: None (Preliminary result)   Collection Time: 09/01/19  5:01 AM   Specimen: BLOOD  Result Value Ref Range Status   Specimen Description BLOOD LEFT [facility]  Final   Special Requests   Final    BOTTLES DRAWN AEROBIC AND ANAEROBIC Blood Culture adequate volume   Culture   Final    NO GROWTH 3 DAYS Performed at [facility], [address] [place], [place] [address]    Report Status PENDING  Incomplete  CULTURE, BLOOD (ROUTINE X 2) w Reflex to ID Panel     Status: None (Preliminary result)   Collection Time: 09/01/19  5:01 AM   Specimen: BLOOD  Result Value Ref Range Status   Specimen Description BLOOD LEFT HAN  Final   Special Requests   Final    BOTTLES DRAWN AEROBIC AND ANAEROBIC Blood Culture adequate volume   Culture   Final    NO GROWTH 3 DAYS Performed at [facility], [address] [place], [place] [address]    Report Status PENDING  Incomplete  MRSA PCR Screening     Status: None   Collection Time: 09/01/19  1:46 PM   Specimen: Nasal Mucosa; Nasopharyngeal  Result Value Ref Range Status   MRSA by PCR NEGATIVE NEGATIVE Final    Comment:        The GeneXpert MRSA Assay (FDA approved for NASAL specimens only), is one component of a comprehensive MRSA colonization surveillance program. It is not intended to diagnose MRSA infection nor to guide or monitor treatment for MRSA infections. Performed at [facility], [address] [place],  [address]       Lab Results  Component Value Date   PTH 269 (H) 03/22/2019   PTH Comment 03/22/2019   CALCIUM 7.5 (L) 09/04/2019   CAION 0.90 (L) 06/09/2019   PHOS 6.7 (H) 09/04/2019               Impression:  Patient is a 69 year old Hispanic female with a past medical history of ESRD on hemodialysis, diabetes mellitus, hypertension who was admitted on April 24 for chief complaint of  encephalopathy & sepsis  1)Renal ESRD Patient is on hemodialysis on Tuesday Thursday Saturday schedule. 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 Phosphorus at goal.   5) metabolic encephalopathy  patient has had MRI/EEG done Patient is being closely followed by the neurologist and the hospitalist team  6) electrolytes   sodium Hyponatremia-secondary to ESRD Inability to get rid of free water   potassium Hyperkalemia-secondary to ESRD We will dialyze patient today with 2K bath that should help    7)Acid base Co2 at goal     Plan:   Patient is currently being dialyzed at [facility] to help with hyperkalemia Patient is on Epogen protocol for anemia of chronic disease We will continue to follow    Mylani Gentry s [facility] 09/04/2019, 9:02 AM

## 2019-09-04 NOTE — Progress Notes (Signed)
The patient could not be wean off oxygen this AM. Oxygen drops to 87% on room air and oxygen sustains at 93% on 1L of oxygen via nasal cannula

## 2019-09-04 NOTE — Progress Notes (Signed)
PROGRESS NOTE    Namita Yearwood  VVO:[id number] DOB: 01/03/1951 DOA: 08/16/2019 PCP: Patient, No Pcp Per      Brief Narrative:  [name] is a 69 y.o. F with HTN, DM, visiting from [place] and developed ESRD last fall who presented with AMS and decreased responsiveness over several days.  In the ER, BP 216/111 and febrile to 102.56F, HR 120, confused, lactate 2.  Started on antibiotics and admitted.  Intracranial imaging normal, encephalopathy resolved with BP control.  No source of infection found, sepsis ruled out.  5/10: Patient eating strawberries from family, aspirated, required transfer to ICU and HFNC at 40lpm.           Assessment & Plan:  Acute hypoxic respiratory failure due to aspiration This developed 5/10 after aspiration of food.  It is improving, now weaned to 1L. -Dysphagia 1 diet -SLP consult -Continue Zosyn, day 4 of 5    Acute metabolic encephalopathy This was the chief presenting complaint, this is now resolved with blood pressure control, dialysis.  End-stage renal disease -Consult nephrology for routine dialysis  Diabetes Glucose is controlled -Continue sliding scale correction insulin with meals  Hypertensive emergency Blood pressure controlled -Continue amlodipine, clonidine, furosemide, losartan  Anemia of chronic kidney disease Hemoglobin stable, no clinical bleeding.  Severe protein calorie malnutrition  -Nutrition consult         Disposition: Status is: Inpatient  Remains inpatient appropriate because:requires ongoing supplemental O2, ongoing Iv antibiotics due to severity of illness (aspiration pneumonia in setting of ESRD requiring high flow O2)   Dispo: The patient is from: Home              Anticipated d/c is to: TBD              Anticipated d/c date is: 2 days              Patient currently is not medically stable to d/c.    We will need to wean off O2 and arrange for [facility].  Likely  home in 1-2 days.           MDM: The below labs and imaging reports reviewed and summarized above.  Medication management as above.    DVT prophylaxis: Heparin Code Status: FULL Family Communication: None present    Consultants:   Nephrology  Neurology  Palliative care  Critical care  Procedures:   4/28 EEG: Slowing, consistent with severe encephalopathic state, etiology nonspecific  Antimicrobials:   Vancomycin/Zosyn x1 4/24  Cefepime/Flagyl 4/25>>4/28  Vancomycin x1 5/9  Zosyn 5/9>>  Culture data:   4/24 blood culture x2-no growth to date  4/24 urine culture-no growth          Subjective: No respiratory distress, fever, malaise.  She is still weak and tired.         Objective: Vitals:   09/04/19 1235 09/04/19 1320 09/04/19 1321 09/04/19 1330  BP: 112/62   108/61  Pulse: 92   92  Resp: 15   16  Temp:    98.8 F (37.1 C)  TempSrc:    Oral  SpO2: 100% (!) 88% 94% 96%  Weight:      Height:        Intake/Output Summary (Last 24 hours) at 09/04/2019 1333 Last data filed at 09/04/2019 1235 Gross per 24 hour  Intake 0 ml  Output 1283 ml  Net -1283 ml   Filed Weights   09/02/19 1637 09/03/19 0500 09/04/19 0535  Weight:  38.9 kg 38.1 kg 37.9 kg    Examination: General appearance: Elderly adult female, on dialysis, no acute distress, sleeping, arouses slowly     HEENT:    Skin:  Cardiac: Regular rate and rhythm, no murmurs appreciated, JVP normal, no lower extremity edema Respiratory: Respiratory effort normal, lung sounds diminished, no rales or wheezes appreciated Abdomen: Abdomen soft no tenderness palpation, no ascites or distention MSK:  Neuro:    Psych:       Data Reviewed: I have personally reviewed following labs and imaging studies:  CBC: Recent Labs  Lab 08/30/19 0449 08/30/19 1434 09/01/19 0210 09/01/19 0501 09/02/19 0520 09/03/19 0600 09/04/19 0519  WBC 7.9   < > 15.8* 15.0* 12.7* 12.1* 12.2*   NEUTROABS 5.3  --   --   --  12.3* 11.8*  --   HGB 7.1*   < > 8.5* 8.3* 9.5* 8.5* 8.0*  HCT 22.3*   < > 25.8* 24.4* 27.7* 24.5* 24.2*  MCV 84.8   < > 84.9 83.0 82.2 81.7 84.0  PLT 303   < > 284 268 260 254 275   < > = values in this interval not displayed.   Basic Metabolic Panel: Recent Labs  Lab 08/29/19 0602 09/01/19 0210 09/02/19 0520 09/03/19 0600 09/04/19 0519  NA 136 132* 127* 130* 131*  K 4.7 4.7 6.5* 4.7 5.6*  CL 94* 89* 86* 90* 90*  CO2 33* 30 28 27 27   GLUCOSE 104* 149* 155* 258* 138*  BUN 33* 46* 63* 48* 72*  CREATININE 2.80* 3.24* 4.70* 3.67* 4.51*  CALCIUM 7.8* 7.8* 7.7* 7.8* 7.5*  PHOS  --   --   --   --  6.7*   GFR: Estimated Creatinine Clearance: 6.9 mL/min (A) (by C-G formula based on SCr of 4.51 mg/dL (H)). Liver Function Tests: Recent Labs  Lab 09/01/19 0210 09/04/19 0519  AST 36  --   ALT 31  --   ALKPHOS 99  --   BILITOT 0.4  --   PROT 6.1*  --   ALBUMIN 2.7* 2.7*   Recent Labs  Lab 09/01/19 0210  LIPASE 40   No results for input(s): AMMONIA in the last 168 hours. Coagulation Profile: No results for input(s): INR, PROTIME in the last 168 hours. Cardiac Enzymes: No results for input(s): CKTOTAL, CKMB, CKMBINDEX, TROPONINI in the last 168 hours. BNP (last 3 results) No results for input(s): PROBNP in the last 8760 hours. HbA1C: No results for input(s): HGBA1C in the last 72 hours. CBG: Recent Labs  Lab 09/03/19 1959 09/03/19 2201 09/04/19 0009 09/04/19 0734 09/04/19 1306  GLUCAP 179* 135* 145* 143* 98   Lipid Profile: No results for input(s): CHOL, HDL, LDLCALC, TRIG, CHOLHDL, LDLDIRECT in the last 72 hours. Thyroid Function Tests: No results for input(s): TSH, T4TOTAL, FREET4, T3FREE, THYROIDAB in the last 72 hours. Anemia Panel: No results for input(s): VITAMINB12, FOLATE, FERRITIN, TIBC, IRON, RETICCTPCT in the last 72 hours. Urine analysis:    Component Value Date/Time   COLORURINE YELLOW (A) 09/01/2019 0442    APPEARANCEUR HAZY (A) 09/01/2019 0442   LABSPEC 1.015 09/01/2019 0442   PHURINE 9.0 (H) 09/01/2019 0442   GLUCOSEU >=500 (A) 09/01/2019 0442   HGBUR NEGATIVE 09/01/2019 0442   BILIRUBINUR NEGATIVE 09/01/2019 0442   KETONESUR NEGATIVE 09/01/2019 0442   PROTEINUR >=300 (A) 09/01/2019 0442   NITRITE NEGATIVE 09/01/2019 0442   LEUKOCYTESUR NEGATIVE 09/01/2019 0442   Sepsis Labs: @LABRCNTIP (procalcitonin:4,lacticacidven:4)  ) Recent Results (from the past 240  hour(s))  MRSA PCR Screening     Status: None   Collection Time: 08/26/19  4:00 AM   Specimen: Nasal Mucosa; Nasopharyngeal  Result Value Ref Range Status   MRSA by PCR NEGATIVE NEGATIVE Final    Comment:        The GeneXpert MRSA Assay (FDA approved for NASAL specimens only), is one component of a comprehensive MRSA colonization surveillance program. It is not intended to diagnose MRSA infection nor to guide or monitor treatment for MRSA infections. Performed at [facility], [address] [place], [place] [address]   CULTURE, BLOOD (ROUTINE X 2) w Reflex to ID Panel     Status: None   Collection Time: 08/29/19  8:37 AM   Specimen: BLOOD  Result Value Ref Range Status   Specimen Description BLOOD LEFT ANTECUBITAL  Final   Special Requests   Final    BOTTLES DRAWN AEROBIC AND ANAEROBIC Blood Culture adequate volume   Culture   Final    NO GROWTH 5 DAYS Performed at [facility], [address] [place], [place] [address]    Report Status 09/03/2019 FINAL  Final  CULTURE, BLOOD (ROUTINE X 2) w Reflex to ID Panel     Status: None   Collection Time: 08/29/19  8:37 AM   Specimen: BLOOD  Result Value Ref Range Status   Specimen Description BLOOD BLOOD LEFT HAND  Final   Special Requests   Final    BOTTLES DRAWN AEROBIC AND ANAEROBIC Blood Culture adequate volume   Culture   Final    NO GROWTH 5 DAYS Performed at [facility], [address] [place], [place] [address]    Report Status  09/03/2019 FINAL  Final  CULTURE, BLOOD (ROUTINE X 2) w Reflex to ID Panel     Status: None (Preliminary result)   Collection Time: 09/01/19  5:01 AM   Specimen: BLOOD  Result Value Ref Range Status   Specimen Description BLOOD LEFT [facility]  Final   Special Requests   Final    BOTTLES DRAWN AEROBIC AND ANAEROBIC Blood Culture adequate volume   Culture   Final    NO GROWTH 3 DAYS Performed at [facility], [address], [place], [place] [address]    Report Status PENDING  Incomplete  CULTURE, BLOOD (ROUTINE X 2) w Reflex to ID Panel     Status: None (Preliminary result)   Collection Time: 09/01/19  5:01 AM   Specimen: BLOOD  Result Value Ref Range Status   Specimen Description BLOOD LEFT HAN  Final   Special Requests   Final    BOTTLES DRAWN AEROBIC AND ANAEROBIC Blood Culture adequate volume   Culture   Final    NO GROWTH 3 DAYS Performed at [facility], [address] [place], [place] [address]    Report Status PENDING  Incomplete  MRSA PCR Screening     Status: None   Collection Time: 09/01/19  1:46 PM   Specimen: Nasal Mucosa; Nasopharyngeal  Result Value Ref Range Status   MRSA by PCR NEGATIVE NEGATIVE Final    Comment:        The GeneXpert MRSA Assay (FDA approved for NASAL specimens only), is one component of a comprehensive MRSA colonization surveillance program. It is not intended to diagnose MRSA infection nor to guide or monitor treatment for MRSA infections. Performed at [facility], [address] [place], [place] [address]          Radiology  Studies: No results found.      Scheduled Meds: . amLODipine  5 mg Oral Daily  . vitamin C  500 mg Oral BID  . budesonide (PULMICORT) nebulizer solution  0.5 mg Nebulization BID  . calcitRIOL  0.25 mcg Oral Daily  . Chlorhexidine Gluconate Cloth  6 each Topical Daily  . cloNIDine  0.2 mg Transdermal Weekly  . epoetin (EPOGEN/PROCRIT) injection  4,000 Units Intravenous Q  T,Th,Sa-HD  . feeding supplement (NEPRO CARB STEADY)  237 mL Oral BID BM  . furosemide  80 mg Oral QODAY  . heparin  5,000 Units Subcutaneous Q12H  . insulin aspart  0-20 Units Subcutaneous TID WC  . insulin aspart  0-5 Units Subcutaneous QHS  . losartan  50 mg Oral Daily  . multivitamin  1 tablet Oral QHS   Continuous Infusions: . piperacillin-tazobactam (ZOSYN)  IV 2.25 g (09/04/19 0524)     LOS: 19 days    Time spent: 25 minutes    Edwin Dada, MD [facility] 09/04/2019, 1:33 PM     Please page though [place] or Epic secure chat:  For [employer], Adult nurse

## 2019-09-04 NOTE — Progress Notes (Signed)
A second attempt was done to  [facility] the patient off oxygen when she was up in the chair. Her oxygen level drops to 87-88% on room air and stays at 93% on 1L of oxygen. The patient ambulated in the room with the assistance of the nurse while using the walker.

## 2019-09-05 LAB — CBC
HCT: 27.2 % — ABNORMAL LOW (ref 36.0–46.0)
Hemoglobin: 9.1 g/dL — ABNORMAL LOW (ref 12.0–15.0)
MCH: 28 pg (ref 26.0–34.0)
MCHC: 33.5 g/dL (ref 30.0–36.0)
MCV: 83.7 fL (ref 80.0–100.0)
Platelets: 325 10*3/uL (ref 150–400)
RBC: 3.25 MIL/uL — ABNORMAL LOW (ref 3.87–5.11)
RDW: 16.4 % — ABNORMAL HIGH (ref 11.5–15.5)
WBC: 10.8 10*3/uL — ABNORMAL HIGH (ref 4.0–10.5)
nRBC: 0.8 % — ABNORMAL HIGH (ref 0.0–0.2)

## 2019-09-05 LAB — GLUCOSE, CAPILLARY
Glucose-Capillary: 39 mg/dL — CL (ref 70–99)
Glucose-Capillary: 108 mg/dL — ABNORMAL HIGH (ref 70–99)
Glucose-Capillary: 145 mg/dL — ABNORMAL HIGH (ref 70–99)

## 2019-09-05 LAB — BASIC METABOLIC PANEL
Anion gap: 13 (ref 5–15)
BUN: 32 mg/dL — ABNORMAL HIGH (ref 8–23)
CO2: 30 mmol/L (ref 22–32)
Calcium: 7.3 mg/dL — ABNORMAL LOW (ref 8.9–10.3)
Chloride: 94 mmol/L — ABNORMAL LOW (ref 98–111)
Creatinine, Ser: 2.26 mg/dL — ABNORMAL HIGH (ref 0.44–1.00)
GFR calc Af Amer: 25 mL/min — ABNORMAL LOW (ref >60)
GFR calc non Af Amer: 21 mL/min — ABNORMAL LOW (ref >60)
Glucose, Bld: 136 mg/dL — ABNORMAL HIGH (ref 70–99)
Potassium: 3.7 mmol/L (ref 3.5–5.1)
Sodium: 137 mmol/L (ref 135–145)

## 2019-09-05 MED ORDER — AMLODIPINE BESYLATE 5 MG PO TABS: 5.0000 mg | ORAL_TABLET | Freq: Every day | ORAL | 3 refills | Status: AC

## 2019-09-05 MED ORDER — AMOXICILLIN-POT CLAVULANATE 875-125 MG PO TABS
1.0000 | ORAL_TABLET | Freq: Two times a day (BID) | ORAL | 0 refills | Status: DC
Start: 2019-09-05 — End: 2019-09-09

## 2019-09-05 MED ORDER — FUROSEMIDE 80 MG PO TABS: 80.0000 mg | ORAL_TABLET | ORAL | 3 refills | Status: AC

## 2019-09-05 MED ORDER — BLOOD GLUCOSE MONITOR KIT: PACK | 0 refills | Status: DC

## 2019-09-05 MED ORDER — LOPERAMIDE HCL 2 MG PO CAPS
2.0000 mg | ORAL_CAPSULE | ORAL | Status: AC | PRN
  Administered 2019-09-05: 2 mg via ORAL
  Filled 2019-09-05: qty 1

## 2019-09-05 NOTE — Discharge Summary (Addendum)
Physician Discharge Summary  Marybelle Giraldo NWG:[id number] DOB: 09-Jan-1951 DOA: 08/16/2019  PCP: Patient, No Pcp Per  Admit date: 08/16/2019 Discharge date: 09/05/2019  Admitted From: Home  Disposition:  Home with [facility]   Recommendations for Outpatient Follow-up:  1. Follow up with Nephrology per routine 2. Nephrology: Please refer to palliative care as an outpatient if possible      Home Health: PT/OT  Equipment/Devices: Wheelchair, walker, bedside commode  Discharge Condition: Poor, declining  CODE STATUS: FULL Diet recommendation: Renal  Brief/Interim Summary: [name] is a 69 y.o. F with HTN, DM, visiting from [place] and developed ESRD last fall who presented with AMS and decreased responsiveness over several days.  In the ER, BP 216/111 and febrile to 102.90F, HR 120, confused, lactate 2.  Started on antibiotics and admitted.  Intracranial imaging normal, encephalopathy resolved with BP control, dialysis and antibiotics.  No source of infection found, antibiotics stopped, sepsis ruled out.             PRINCIPAL HOSPITAL DIAGNOSIS: Acute metabolic encephalopathy    Discharge Diagnoses:   Acute metabolic encephalopathy This was the chief presenting complaint, this is now resolved with blood pressure control, dialysis and antibiotics.  There was no source every found for her initial fever, and so it may have been viral.  Sepsis was doubted.   Acute hypoxic respiratory failure due to aspiration After recovering from her initial encephalopathy, the patient developed respiratory distress 5/10 after aspiration of food.  She was transferred to ICU on high flow nasal cannula, started on antibiotics and chest PT and rapidly improved to room air.     She completed 5 days of Zosyn and was discharged to complete 3 more days Augmentin.    End-stage renal disease  Diabetes  Hypertensive emergency Initial BP very elevated.  This improved and  stabilized with amlodipine and furosemide daily.     Anemia of chronic kidney disease Hemoglobin stable, no clinical bleeding.  Severe protein calorie malnutrition   Antibiotic associated diarrhea Soft BM after starting antibiotics.  Doubt intra-abdominal infection, doubt Cdiff.                 Discharge Instructions  Discharge Instructions    Discharge instructions   Complete by: As directed    You were admitted to the hospital with confusion. This was partly from infection (we believe it was a pneumonia, or lung infection) and partly from high blood pressure.  While you were here, you were treated with antibiotics for the infection, and your blood pressure was fixed with dialysis and blood pressure medicine.  For your blood pressure: Take amlodipine 5 mg once daily every day, even on dialysis days Take furosemide 80 mg every other day (take on every NOT dialysis day)    While you were here, you developed another infection due to accidentally swallowing some food down into your lungs. For this you were treated again with antibiotics. You should take 3 more days of antibiotics (Augmentin) and then stop. You should also be careful to eat soft foods, avoid meat and bread.  If you have diarrhea, take loperamide/Imodium, the over the counter anti-diarrhea medicine    Your blood pressure medicines: Amlodipine 5 mg every day Furosemide 80 mg every not dialysis day   Your antibiotic for the next few days: Augmentin 875-125 mg twice daily   Increase activity slowly   Complete by: As directed      Allergies as of 09/05/2019  No Known Allergies     Medication List    STOP taking these medications   calcium carbonate 500 MG chewable tablet Commonly known as: Tums   ondansetron 4 MG disintegrating tablet Commonly known as: Zofran ODT   PRESCRIPTION MEDICATION   PRESCRIPTION MEDICATION     TAKE these medications   amLODipine 5 MG tablet Commonly known  as: NORVASC Take 1 tablet (5 mg total) by mouth daily. Start taking on: Sep 06, 2019 What changed:   medication strength  how much to take   amoxicillin-clavulanate 875-125 MG tablet Commonly known as: Augmentin Take 1 tablet by mouth 2 (two) times daily for 3 days.   blood glucose meter kit and supplies Kit Dispense based on patient and insurance preference. Use up to four times daily as directed. (FOR ICD-9 250.00, 250.01).   calcium-vitamin D 500-200 MG-UNIT tablet Commonly known as: OSCAL WITH D Take 1 tablet by mouth daily with breakfast.   furosemide 80 MG tablet Commonly known as: LASIX Take 1 tablet (80 mg total) by mouth every other day. Start taking on: Sep 06, 2019            Durable Medical Equipment  (From admission, onward)         Start     Ordered   09/04/19 7096  DME 3-in-1  (Discharge Planning)  Once     09/04/19 0941   09/04/19 0942  DME standard manual wheelchair with seat cushion  (Discharge Planning)  Once    Comments: Patient suffers from renal failure which impairs their ability to perform daily activities like bathing, dressing, feeding, grooming and toileting in the home.  A crutch or walker will not resolve issue with performing activities of daily living. A wheelchair will allow patient to safely perform daily activities. Patient can safely propel the wheelchair in the home or has a caregiver who can provide assistance. Length of need Lifetime. Accessories: elevating leg rests (ELRs), wheel locks, extensions and anti-tippers.   09/04/19 0941   09/04/19 0942  For home use only DME Hospital bed  Once    Question Answer Comment  Length of Need Lifetime   Bed type Semi-electric      09/04/19 0941          No Known Allergies  Consultations:  Critical care  Palliative care  Neurology  Nephrology   Procedures/Studies: EEG  Result Date: 08/20/2019 Alexis Goodell, MD     08/20/2019  4:59 PM ELECTROENCEPHALOGRAM REPORT Patient:  Angelia Hazell       Room #: GE36O-QH EEG No. ID: [id number] Age: 69 y.o.        Sex: female Requesting Physician: Mal Misty Report Date:  08/20/2019       Interpreting Physician: Alexis Goodell History: Lillianah Swartzentruber is an 69 y.o. female with altered mental status Medications: Norvasc, Rocephin, Catapres, Lasix, Insulin Conditions of Recording:  This is a 21 channel routine scalp EEG performed with bipolar and monopolar montages arranged in accordance to the international 10/20 system of electrode placement. One channel was dedicated to EKG recording. The patient is in the poorly responsive state. Description:  The background activity is slow and poorly organized diffusely with frequent, high voltage, generalized, intermittent periodic discharges of triphasic morphology.  These are continuous throughout the recording and occur at a frequency of approximately 2-3 Hz.  There is no change in the background rhythm with stimulation.  Hyperventilation and intermittent photic stimulation were not performed. IMPRESSION: This is a  markedly abnormal electroencephalogram due to general background slowing and frequent, generalized triphasic waves.  This finding is consistent with a severe encephalopathic state, etiology nonspecific. Alexis Goodell, MD Neurology [phone] 08/20/2019, 4:02 PM   DG Chest 1 View  Result Date: 08/31/2019 CLINICAL DATA:  70 year old female with chest and abdominal pain. EXAM: CHEST  1 VIEW; ABDOMEN - 1 VIEW COMPARISON:  Radiograph dated 08/19/2019. FINDINGS: Right-sided dialysis catheter with tip over right atrium. Bilateral confluent airspace opacities, new since the prior radiograph may represent edema but concerning for pneumonia. Clinical correlation recommended. No pleural effusion pneumothorax. Stable cardiomegaly. There is no bowel dilatation or evidence of obstruction. No free air or radiopaque calculi. Osteopenia. No acute osseous pathology. Partially  visualized right femoral fixation hardware. IMPRESSION: 1. Bilateral confluent airspace opacities may represent edema but concerning for pneumonia. Clinical correlation is recommended. 2. No bowel obstruction. Electronically Signed   By: Anner Crete M.D.   On: 08/31/2019 23:31   DG Abd 1 View  Result Date: 09/04/2019 CLINICAL DATA:  Abdominal distension. EXAM: ABDOMEN - 1 VIEW COMPARISON:  Sep 01, 2019. FINDINGS: The bowel gas pattern is normal. No radio-opaque calculi or other significant radiographic abnormality are seen. IMPRESSION: Negative. Electronically Signed   By: Marijo Conception M.D.   On: 09/04/2019 18:43   DG Abd 1 View  Result Date: 08/31/2019 CLINICAL DATA:  69 year old female with chest and abdominal pain. EXAM: CHEST  1 VIEW; ABDOMEN - 1 VIEW COMPARISON:  Radiograph dated 08/19/2019. FINDINGS: Right-sided dialysis catheter with tip over right atrium. Bilateral confluent airspace opacities, new since the prior radiograph may represent edema but concerning for pneumonia. Clinical correlation recommended. No pleural effusion pneumothorax. Stable cardiomegaly. There is no bowel dilatation or evidence of obstruction. No free air or radiopaque calculi. Osteopenia. No acute osseous pathology. Partially visualized right femoral fixation hardware. IMPRESSION: 1. Bilateral confluent airspace opacities may represent edema but concerning for pneumonia. Clinical correlation is recommended. 2. No bowel obstruction. Electronically Signed   By: Anner Crete M.D.   On: 08/31/2019 23:31   CT Head Wo Contrast  Result Date: 08/16/2019 CLINICAL DATA:  Sepsis, nausea, vomiting, diarrhea, dialysis dependent EXAM: CT HEAD WITHOUT CONTRAST TECHNIQUE: Contiguous axial images were obtained from the base of the skull through the vertex without intravenous contrast. COMPARISON:  01/15/2018 FINDINGS: Brain: No acute infarct or hemorrhage. Chronic small vessel ischemic changes are seen within the bilateral  basal ganglia and periventricular white matter. The lateral ventricles and remaining midline structures are unremarkable. No acute extra-axial fluid collections. No mass effect. Vascular: No hyperdense vessel or unexpected calcification. Skull: Normal. Negative for fracture or focal lesion. Sinuses/Orbits: Minimal secretions within the right sphenoid sinus. Remaining sinuses are clear. Other: None. IMPRESSION: 1. Stable chronic small vessel ischemic changes.  No acute process. Electronically Signed   By: Randa Ngo M.D.   On: 08/16/2019 21:53   MR BRAIN WO CONTRAST  Result Date: 08/19/2019 CLINICAL DATA:  In stage renal disease, diabetes and hypertension. Altered mental status. EXAM: MRI HEAD WITHOUT CONTRAST TECHNIQUE: Multiplanar, multiecho pulse sequences of the brain and surrounding structures were obtained without intravenous contrast. COMPARISON:  Head CT 3 days ago FINDINGS: Brain: Diffusion imaging does not show any acute or subacute infarction. No focal abnormality affects the brainstem or cerebellum. Cerebral hemispheres show old small vessel infarction in the left basal ganglia and mild to moderate chronic small-vessel ischemic changes of the cerebral hemispheric white matter. There is an old right parietal cortical and subcortical infarction. No  mass lesion, hemorrhage, hydrocephalus or extra-axial collection. Vascular: Major vessels at the base of the brain show flow. Skull and upper cervical spine: Negative Sinuses/Orbits: Mild mucosal inflammatory changes of the sinuses. Bilateral mastoid effusions. Orbits negative. Other: None IMPRESSION: No acute finding. 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 Moderate chronic small-vessel ischemic changes of the hemispheric white matter. Electronically Signed   By: Nelson Chimes M.D.   On: 08/19/2019 17:08   DG Chest Port 1 View  Result Date: 09/01/2019 CLINICAL DATA:  Hypoxia EXAM: PORTABLE CHEST 1  VIEW COMPARISON:  08/31/2019, 08/29/2019 FINDINGS: Right-sided central venous catheter tip over the right atrium. Cardiomegaly with extensive bilateral airspace opacities progressed compared to prior radiograph. Increased right pleural effusion. No pneumothorax. IMPRESSION: Cardiomegaly with interval progression of extensive bilateral airspace opacities, edema versus diffuse pneumonia. Increased right-sided pleural effusion. Electronically Signed   By: Donavan Foil M.D.   On: 09/01/2019 21:23   DG Chest Port 1 View  Result Date: 08/29/2019 CLINICAL DATA:  Fever. EXAM: PORTABLE CHEST 1 VIEW COMPARISON:  08/16/2019. FINDINGS: Right-sided dual-lumen catheter in unchanged position. Stable cardiomegaly. Mild bilateral interstitial prominence again noted. Component these changes may be chronic however an active interstitial process including pneumonitis and or interstitial edema cannot be excluded. No pleural effusion or pneumothorax. IMPRESSION: 1.  Right-sided dual-lumen catheter in unchanged position. 2. Stable cardiomegaly. 3 mild bilateral interstitial prominence again noted. A component these changes may be chronic however an active interstitial process including pneumonitis and or interstitial edema cannot be excluded. Similar finding noted on prior exam. Electronically Signed   By: McGrath   On: 08/29/2019 09:58   DG Chest Port 1 View  Result Date: 08/16/2019 CLINICAL DATA:  Fever. EXAM: PORTABLE CHEST 1 VIEW COMPARISON:  August 14, 2019 FINDINGS: There is stable right-sided venous catheter positioning. Mild, chronic appearing increased lung markings are seen without evidence of acute infiltrate, pleural effusion or pneumothorax. There is stable moderate to marked severity enlargement of the cardiac silhouette. The visualized skeletal structures are unremarkable. IMPRESSION: Stable cardiomegaly without evidence of acute or active cardiopulmonary disease. Electronically Signed   By: Virgina Norfolk M.D.   On: 08/16/2019 20:50   DG Chest Port 1 View  Result Date: 08/14/2019 CLINICAL DATA:  Altered mental status. EXAM: PORTABLE CHEST 1 VIEW COMPARISON:  Radiograph 04/05/2019 FINDINGS: Right-sided dialysis catheter tip in the atrial caval junction. Unchanged cardiomegaly. Unchanged mediastinal contours. Improved heterogeneous opacities from prior exam, likely improving pulmonary edema. Mild residual peribronchial thickening. Suspected small pleural effusions. No pneumothorax. No acute osseous abnormalities are seen. IMPRESSION: 1. Improved heterogeneous opacities from prior exam, likely improving pulmonary edema. Mild peribronchial thickening may be persistent or recurrent edema or bronchitic inflammation. 2. Suspected small pleural effusions. 3. Unchanged cardiomegaly. Electronically Signed   By: Keith Rake M.D.   On: 08/14/2019 19:39   DG Abd Portable 1V  Result Date: 09/01/2019 CLINICAL DATA:  Abdominal pain, possible constipation EXAM: PORTABLE ABDOMEN - 1 VIEW COMPARISON:  08/31/2019 FINDINGS: Scattered large and small bowel gas is noted similar to that seen on the previous day. No obstructive changes are noted. No findings of constipation are seen. No free air is noted. Stable changes in the lung bases are again seen. No acute bony abnormality is noted. Postsurgical changes in the right hip are seen. IMPRESSION: No acute abnormality in the abdomen. The overall appearance is similar to that seen on the previous day. Electronically Signed   By: Elta Guadeloupe  Lukens M.D.   On: 09/01/2019 18:37   DG Abd Portable 1V  Result Date: 08/19/2019 CLINICAL DATA:  NG tube placement. EXAM: PORTABLE ABDOMEN - 1 VIEW COMPARISON:  None. FINDINGS: Tip and side port of the enteric tube below the diaphragm in the stomach. Right-sided dialysis catheter is partially included. Heart is enlarged. Normal bowel gas pattern in the upper abdomen. IMPRESSION: Tip and side port of the enteric tube below the diaphragm  in the stomach. Electronically Signed   By: Keith Rake M.D.   On: 08/19/2019 18:45      Subjective: The patient has some loose bowel movements, some abdominal discomfort at times, however no chest pain, no respiratory distress, no confusion. She is fatigued and very weak, and she feels generally bad she hopes that God will take away her suffering.  Discharge Exam: Vitals:   09/05/19 1112 09/05/19 1114  BP:  (!) 155/71  Pulse: 84 82  Resp:    Temp:    SpO2: 92% 90%   Vitals:   09/05/19 0500 09/05/19 0543 09/05/19 1112 09/05/19 1114  BP:  (!) 136/55  (!) 155/71  Pulse:  82 84 82  Resp:  15    Temp:  (!) 97.4 F (36.3 C)    TempSrc:  Oral    SpO2:  95% 92% 90%  Weight: 36.8 kg     Height:        General: Pt is alert, awake, not in acute distress, all history collected through video phonic interpreter Cardiovascular: RRR, nl S1-S2, no murmurs appreciated.   No LE edema.   Respiratory: Normal respiratory rate and rhythm.  CTAB without rales or wheezes. Abdominal: Abdomen soft and mild nonfocal tenderness without guarding, rebound, or rigidity.  No distension or HSM.   Neuro/Psych: Strength symmetric in upper and lower extremities but generally weak.  Judgment and insight appear intact although she is mostly not able to engage in conversation due to poor health literacy.   The results of significant diagnostics from this hospitalization (including imaging, microbiology, ancillary and laboratory) are listed below for reference.     Microbiology: Recent Results (from the past 240 hour(s))  CULTURE, BLOOD (ROUTINE X 2) w Reflex to ID Panel     Status: None   Collection Time: 08/29/19  8:37 AM   Specimen: BLOOD  Result Value Ref Range Status   Specimen Description BLOOD LEFT ANTECUBITAL  Final   Special Requests   Final    BOTTLES DRAWN AEROBIC AND ANAEROBIC Blood Culture adequate volume   Culture   Final    NO GROWTH 5 DAYS Performed at [facility], [address] [place], [place] [address]    Report Status 09/03/2019 FINAL  Final  CULTURE, BLOOD (ROUTINE X 2) w Reflex to ID Panel     Status: None   Collection Time: 08/29/19  8:37 AM   Specimen: BLOOD  Result Value Ref Range Status   Specimen Description BLOOD BLOOD LEFT HAND  Final   Special Requests   Final    BOTTLES DRAWN AEROBIC AND ANAEROBIC Blood Culture adequate volume   Culture   Final    NO GROWTH 5 DAYS Performed at [facility], [address] [place], [place] [address]    Report Status 09/03/2019 FINAL  Final  CULTURE, BLOOD (ROUTINE X 2) w Reflex to ID Panel     Status: None (Preliminary result)   Collection Time: 09/01/19  5:01 AM   Specimen: BLOOD  Result Value Ref  Range Status   Specimen Description BLOOD LEFT AC  Final   Special Requests   Final    BOTTLES DRAWN AEROBIC AND ANAEROBIC Blood Culture adequate volume   Culture   Final    NO GROWTH 4 DAYS Performed at [facility], [address] [place], [place] [address]    Report Status PENDING  Incomplete  CULTURE, BLOOD (ROUTINE X 2) w Reflex to ID Panel     Status: None (Preliminary result)   Collection Time: 09/01/19  5:01 AM   Specimen: BLOOD  Result Value Ref Range Status   Specimen Description BLOOD LEFT HAN  Final   Special Requests   Final    BOTTLES DRAWN AEROBIC AND ANAEROBIC Blood Culture adequate volume   Culture   Final    NO GROWTH 4 DAYS Performed at [facility], [address] [place], [place] [address]    Report Status PENDING  Incomplete  MRSA PCR Screening     Status: None   Collection Time: 09/01/19  1:46 PM   Specimen: Nasal Mucosa; Nasopharyngeal  Result Value Ref Range Status   MRSA by PCR NEGATIVE NEGATIVE Final    Comment:        The GeneXpert MRSA Assay (FDA approved for NASAL specimens only), is one component of a comprehensive MRSA colonization surveillance program. It is not intended to diagnose MRSA infection nor to guide or monitor  treatment for MRSA infections. Performed at [facility], [address] [place], [place] [address]      Labs: BNP (last 3 results) Recent Labs    03/31/19 0903 08/17/19 0656 08/19/19 0406  BNP 327.7* 3,273.0* 1,224.4*   Basic Metabolic Panel: Recent Labs  Lab 09/01/19 0210 09/02/19 0520 09/03/19 0600 09/04/19 0519 09/05/19 0414  NA 132* 127* 130* 131* 137  K 4.7 6.5* 4.7 5.6* 3.7  CL 89* 86* 90* 90* 94*  CO2 _0 GLUCOSE 149* 155* 258* 138* 136*  BUN 46* 63* 48* 72* 32*  CREATININE 3.24* 4.70* 3.67* 4.51* 2.26*  CALCIUM 7.8* 7.7* 7.8* 7.5* 7.3*  PHOS  --   --   --  6.7*  --    Liver Function Tests: Recent Labs  Lab 09/01/19 0210 09/04/19 0519  AST 36  --   ALT 31  --   ALKPHOS 99  --   BILITOT 0.4  --   PROT 6.1*  --   ALBUMIN 2.7* 2.7*   Recent Labs  Lab 09/01/19 0210  LIPASE 40   No results for input(s): AMMONIA in the last 168 hours. CBC: Recent Labs  Lab 08/30/19 0449 08/30/19 1434 09/01/19 0501 09/02/19 0520 09/03/19 0600 09/04/19 0519 09/05/19 0414  WBC 7.9   < > 15.0* 12.7* 12.1* 12.2* 10.8*  NEUTROABS 5.3  --   --  12.3* 11.8*  --   --   HGB 7.1*   < > 8.3* 9.5* 8.5* 8.0* 9.1*  HCT 22.3*   < > 24.4* 27.7* 24.5* 24.2* 27.2*  MCV 84.8   < > 83.0 82.2 81.7 84.0 83.7  PLT 303   < > 268 260 254 275 325   < > = values in this interval not displayed.   Cardiac Enzymes: No results for input(s): CKTOTAL, CKMB, CKMBINDEX, TROPONINI in the last 168 hours. BNP: Invalid input(s): POCBNP CBG: Recent Labs  Lab 09/04/19 1306 09/04/19 1656 09/04/19 2218 09/05/19 0731 09/05/19 1115  GLUCAP 98 192* 126* 108* 145*   D-Dimer No  results for input(s): DDIMER in the last 72 hours. Hgb A1c No results for input(s): HGBA1C in the last 72 hours. Lipid Profile No results for input(s): CHOL, HDL, LDLCALC, TRIG, CHOLHDL, LDLDIRECT in the last 72 hours. Thyroid function studies No results for input(s): TSH, T4TOTAL, T3FREE,  THYROIDAB in the last 72 hours.  Invalid input(s): FREET3 Anemia work up No results for input(s): VITAMINB12, FOLATE, FERRITIN, TIBC, IRON, RETICCTPCT in the last 72 hours. Urinalysis    Component Value Date/Time   COLORURINE YELLOW (A) 09/01/2019 0442   APPEARANCEUR HAZY (A) 09/01/2019 0442   LABSPEC 1.015 09/01/2019 0442   PHURINE 9.0 (H) 09/01/2019 0442   GLUCOSEU >=500 (A) 09/01/2019 0442   HGBUR NEGATIVE 09/01/2019 0442   BILIRUBINUR NEGATIVE 09/01/2019 0442   KETONESUR NEGATIVE 09/01/2019 0442   PROTEINUR >=300 (A) 09/01/2019 0442   NITRITE NEGATIVE 09/01/2019 0442   LEUKOCYTESUR NEGATIVE 09/01/2019 0442   Sepsis Labs Invalid input(s): PROCALCITONIN,  WBC,  LACTICIDVEN Microbiology Recent Results (from the past 240 hour(s))  CULTURE, BLOOD (ROUTINE X 2) w Reflex to ID Panel     Status: None   Collection Time: 08/29/19  8:37 AM   Specimen: BLOOD  Result Value Ref Range Status   Specimen Description BLOOD LEFT ANTECUBITAL  Final   Special Requests   Final    BOTTLES DRAWN AEROBIC AND ANAEROBIC Blood Culture adequate volume   Culture   Final    NO GROWTH 5 DAYS Performed at [facility], [address] [place], [place] [address]    Report Status 09/03/2019 FINAL  Final  CULTURE, BLOOD (ROUTINE X 2) w Reflex to ID Panel     Status: None   Collection Time: 08/29/19  8:37 AM   Specimen: BLOOD  Result Value Ref Range Status   Specimen Description BLOOD BLOOD LEFT HAND  Final   Special Requests   Final    BOTTLES DRAWN AEROBIC AND ANAEROBIC Blood Culture adequate volume   Culture   Final    NO GROWTH 5 DAYS Performed at [facility], [address] [place], [place] [address]    Report Status 09/03/2019 FINAL  Final  CULTURE, BLOOD (ROUTINE X 2) w Reflex to ID Panel     Status: None (Preliminary result)   Collection Time: 09/01/19  5:01 AM   Specimen: BLOOD  Result Value Ref Range Status   Specimen Description BLOOD LEFT [facility]  Final   Special  Requests   Final    BOTTLES DRAWN AEROBIC AND ANAEROBIC Blood Culture adequate volume   Culture   Final    NO GROWTH 4 DAYS Performed at [facility], [address] [place], [place] [address]    Report Status PENDING  Incomplete  CULTURE, BLOOD (ROUTINE X 2) w Reflex to ID Panel     Status: None (Preliminary result)   Collection Time: 09/01/19  5:01 AM   Specimen: BLOOD  Result Value Ref Range Status   Specimen Description BLOOD LEFT HAN  Final   Special Requests   Final    BOTTLES DRAWN AEROBIC AND ANAEROBIC Blood Culture adequate volume   Culture   Final    NO GROWTH 4 DAYS Performed at [facility], [address] [place], [place] [address]    Report Status PENDING  Incomplete  MRSA PCR Screening     Status: None   Collection Time: 09/01/19  1:46 PM   Specimen: Nasal Mucosa; Nasopharyngeal  Result Value Ref Range Status   MRSA by  PCR NEGATIVE NEGATIVE Final    Comment:        The GeneXpert MRSA Assay (FDA approved for NASAL specimens only), is one component of a comprehensive MRSA colonization surveillance program. It is not intended to diagnose MRSA infection nor to guide or monitor treatment for MRSA infections. Performed at [facility], [address] [place], [place] [address]      Time coordinating discharge: 35 minutes The [place] controlled substances registry was reviewed for this patient      SIGNED:   Edwin Dada, MD  [facility] 09/05/2019, 5:19 PM

## 2019-09-05 NOTE — Progress Notes (Signed)
Pt discharged per MD order. IVs removed. Discharge instructions reviewed with pts son. Son verbalized understanding with all questions answered to his satisfaction. Pt given script for glucometer and a coupon for her augmentin. Pt wheeled downstairs in wheelchair by staff.

## 2019-09-05 NOTE — TOC Transition Note (Signed)
Transition of Care [facility]) - CM/SW Discharge Note   Patient Details  Name: Amber Dixon MRN: [id number] Date of Birth: [birth year]  [facility]) CM/SW Contact:  Beverly Sessions, RN Phone Number: 09/05/2019, 2:49 PM   Clinical Narrative:     Patient to discharge home today Patient O2 saturations were checked on RA and with exertion.  Patient does not qualify for home O2 at discharge.  MD notified  Due to patient being and undocumented citizen she does not qualify for charity equipment from [place].  She would have to private pay  Case discussed with [facility] leadership.  Patient was provided with donated charity wheelchair, and bedside commode.  This equipment was delivered to bedside but this RN.  Melissa patient's daughter in law was updated.  Family to transport at discharge  Patient to have charity home health services through [facility] [place].  Cindy with [facility] notified of discharge.    Patient to discharge on oral Augmentin.  RNCM printed goodrx coupon.  Out of pocket cost $13.07  Family confirms they can purchase it at discharge   Final next level of care: [place] Barriers to Discharge: No Barriers Identified   Patient Goals and CMS Choice     Choice offered to / list presented to : Adult Children  Discharge Placement                       Discharge Plan and Services                DME Arranged: Wheelchair manual, Bedside commode         HH Arranged: PT, OT HH Agency: [facility] Agency Contacted: 09/05/19   Representative spoke with at [place]: [place] ([place]) Interventions     Readmission Risk Interventions Readmission Risk Prevention Plan 09/05/2019 08/18/2019  Transportation Screening Complete Complete  Medication Review Press photographer) Complete Complete  PCP or Specialist appointment within 3-5 days of discharge - Complete  HRI or Home Care  Consult Complete Complete  Palliative Care Screening Complete -  [place] Not Applicable Complete

## 2019-09-05 NOTE — Care Management (Signed)
SATURATION QUALIFICATIONS: (This note is used to comply with regulatory documentation for home oxygen)  Patient Saturations on Room Air at Rest = 92%  Patient Saturations on Room Air with exertion= 90%

## 2019-09-06 LAB — CULTURE, BLOOD (ROUTINE X 2)
Culture: NO GROWTH
Culture: NO GROWTH
Special Requests: ADEQUATE
Special Requests: ADEQUATE

## 2019-09-07 ENCOUNTER — Encounter: Payer: Self-pay | Admitting: Internal Medicine

## 2019-09-07 ENCOUNTER — Inpatient Hospital Stay
Admission: EM | Admit: 2019-09-07 | Discharge: 2019-09-09 | DRG: 291 | Disposition: A | Discharge status: Home Health | Payer: Medicaid Other | Attending: Hospitalist | Admitting: Hospitalist

## 2019-09-07 ENCOUNTER — Other Ambulatory Visit: Payer: Self-pay

## 2019-09-07 ENCOUNTER — Emergency Department: Payer: Medicaid Other

## 2019-09-07 DIAGNOSIS — I5033 Acute on chronic diastolic (congestive) heart failure: Secondary | ICD-10-CM | POA: Diagnosis present

## 2019-09-07 DIAGNOSIS — E43 Unspecified severe protein-calorie malnutrition: Secondary | ICD-10-CM | POA: Diagnosis present

## 2019-09-07 DIAGNOSIS — E119 Type 2 diabetes mellitus without complications: Secondary | ICD-10-CM

## 2019-09-07 DIAGNOSIS — I132 Hypertensive heart and chronic kidney disease with heart failure and with stage 5 chronic kidney disease, or end stage renal disease: Principal | ICD-10-CM | POA: Diagnosis present

## 2019-09-07 DIAGNOSIS — I10 Essential (primary) hypertension: Secondary | ICD-10-CM | POA: Diagnosis present

## 2019-09-07 DIAGNOSIS — N186 End stage renal disease: Secondary | ICD-10-CM | POA: Diagnosis present

## 2019-09-07 DIAGNOSIS — N189 Chronic kidney disease, unspecified: Secondary | ICD-10-CM

## 2019-09-07 DIAGNOSIS — E1122 Type 2 diabetes mellitus with diabetic chronic kidney disease: Secondary | ICD-10-CM | POA: Diagnosis present

## 2019-09-07 DIAGNOSIS — Z992 Dependence on renal dialysis: Secondary | ICD-10-CM

## 2019-09-07 DIAGNOSIS — Z79899 Other long term (current) drug therapy: Secondary | ICD-10-CM

## 2019-09-07 DIAGNOSIS — Z681 Body mass index (BMI) 19 or less, adult: Secondary | ICD-10-CM

## 2019-09-07 DIAGNOSIS — J690 Pneumonitis due to inhalation of food and vomit: Secondary | ICD-10-CM | POA: Diagnosis present

## 2019-09-07 DIAGNOSIS — Z20822 Contact with and (suspected) exposure to covid-19: Secondary | ICD-10-CM | POA: Diagnosis present

## 2019-09-07 DIAGNOSIS — J9601 Acute respiratory failure with hypoxia: Secondary | ICD-10-CM | POA: Diagnosis present

## 2019-09-07 DIAGNOSIS — D631 Anemia in chronic kidney disease: Secondary | ICD-10-CM | POA: Diagnosis present

## 2019-09-07 DIAGNOSIS — I160 Hypertensive urgency: Secondary | ICD-10-CM | POA: Diagnosis present

## 2019-09-07 DIAGNOSIS — Z9115 Patient's noncompliance with renal dialysis: Secondary | ICD-10-CM

## 2019-09-07 LAB — GLUCOSE, CAPILLARY
Glucose-Capillary: 81 mg/dL (ref 70–99)
Glucose-Capillary: 119 mg/dL — ABNORMAL HIGH (ref 70–99)

## 2019-09-07 LAB — COMPREHENSIVE METABOLIC PANEL
ALT: 26 U/L (ref 0–44)
AST: 20 U/L (ref 15–41)
Albumin: 3 g/dL — ABNORMAL LOW (ref 3.5–5.0)
Alkaline Phosphatase: 95 U/L (ref 38–126)
Anion gap: 18 — ABNORMAL HIGH (ref 5–15)
BUN: 57 mg/dL — ABNORMAL HIGH (ref 8–23)
CO2: 25 mmol/L (ref 22–32)
Calcium: 8 mg/dL — ABNORMAL LOW (ref 8.9–10.3)
Chloride: 88 mmol/L — ABNORMAL LOW (ref 98–111)
Creatinine, Ser: 4.5 mg/dL — ABNORMAL HIGH (ref 0.44–1.00)
GFR calc Af Amer: 11 mL/min — ABNORMAL LOW (ref >60)
GFR calc non Af Amer: 9 mL/min — ABNORMAL LOW (ref >60)
Glucose, Bld: 116 mg/dL — ABNORMAL HIGH (ref 70–99)
Potassium: 4.5 mmol/L (ref 3.5–5.1)
Sodium: 131 mmol/L — ABNORMAL LOW (ref 135–145)
Total Bilirubin: 0.9 mg/dL (ref 0.3–1.2)
Total Protein: 6.9 g/dL (ref 6.5–8.1)

## 2019-09-07 LAB — CBC WITH DIFFERENTIAL/PLATELET
Abs Immature Granulocytes: 0.04 10*3/uL (ref 0.00–0.07)
Basophils Absolute: 0 10*3/uL (ref 0.0–0.1)
Basophils Relative: 0 %
Eosinophils Absolute: 0.2 10*3/uL (ref 0.0–0.5)
Eosinophils Relative: 3 %
HCT: 26.9 % — ABNORMAL LOW (ref 36.0–46.0)
Hemoglobin: 8.9 g/dL — ABNORMAL LOW (ref 12.0–15.0)
Immature Granulocytes: 1 %
Lymphocytes Relative: 6 %
Lymphs Abs: 0.5 10*3/uL — ABNORMAL LOW (ref 0.7–4.0)
MCH: 27.6 pg (ref 26.0–34.0)
MCHC: 33.1 g/dL (ref 30.0–36.0)
MCV: 83.3 fL (ref 80.0–100.0)
Monocytes Absolute: 0.6 10*3/uL (ref 0.1–1.0)
Monocytes Relative: 7 %
Neutro Abs: 7.2 10*3/uL (ref 1.7–7.7)
Neutrophils Relative %: 83 %
Platelets: 390 10*3/uL (ref 150–400)
RBC: 3.23 MIL/uL — ABNORMAL LOW (ref 3.87–5.11)
RDW: 16.9 % — ABNORMAL HIGH (ref 11.5–15.5)
WBC: 8.6 10*3/uL (ref 4.0–10.5)
nRBC: 0 % (ref 0.0–0.2)

## 2019-09-07 LAB — SARS CORONAVIRUS 2 BY RT PCR (HOSPITAL ORDER, PERFORMED IN ~~LOC~~ HOSPITAL LAB): SARS Coronavirus 2: NEGATIVE

## 2019-09-07 LAB — LACTIC ACID, PLASMA: Lactic Acid, Venous: 0.7 mmol/L (ref 0.5–1.9)

## 2019-09-07 LAB — BLOOD GAS, VENOUS
Acid-Base Excess: 4.1 mmol/L — ABNORMAL HIGH (ref 0.0–2.0)
Bicarbonate: 28.5 mmol/L — ABNORMAL HIGH (ref 20.0–28.0)
O2 Saturation: 93.6 %
Patient temperature: 37
pCO2, Ven: 41 mmHg — ABNORMAL LOW (ref 44.0–60.0)
pH, Ven: 7.45 — ABNORMAL HIGH (ref 7.250–7.430)
pO2, Ven: 66 mmHg — ABNORMAL HIGH (ref 32.0–45.0)

## 2019-09-07 LAB — TROPONIN I (HIGH SENSITIVITY)
Troponin I (High Sensitivity): 15 ng/L (ref <18)
Troponin I (High Sensitivity): 15 ng/L (ref <18)

## 2019-09-07 LAB — BRAIN NATRIURETIC PEPTIDE: B Natriuretic Peptide: 1353 pg/mL — ABNORMAL HIGH (ref 0.0–100.0)

## 2019-09-07 LAB — PROCALCITONIN: Procalcitonin: 3.11 ng/mL

## 2019-09-07 MED ORDER — AMOXICILLIN-POT CLAVULANATE 875-125 MG PO TABS: 1.0000 | ORAL_TABLET | Freq: Two times a day (BID) | ORAL | Status: DC

## 2019-09-07 MED ORDER — VANCOMYCIN HCL IN DEXTROSE 1-5 GM/200ML-% IV SOLN
1000.0000 mg | Freq: Once | INTRAVENOUS | Status: AC
  Administered 2019-09-07: 1000 mg via INTRAVENOUS
  Filled 2019-09-07: qty 200

## 2019-09-07 MED ORDER — SODIUM CHLORIDE 0.9 % IV SOLN: 250.0000 mL | INTRAVENOUS | Status: DC | PRN

## 2019-09-07 MED ORDER — ACETAMINOPHEN 325 MG PO TABS
650.0000 mg | ORAL_TABLET | Freq: Four times a day (QID) | ORAL | Status: DC | PRN
  Administered 2019-09-08: 650 mg via ORAL
  Filled 2019-09-07: qty 2

## 2019-09-07 MED ORDER — AMOXICILLIN-POT CLAVULANATE 500-125 MG PO TABS: 1.0000 | ORAL_TABLET | ORAL | Status: DC

## 2019-09-07 MED ORDER — VANCOMYCIN HCL IN DEXTROSE 1-5 GM/200ML-% IV SOLN
1000.0000 mg | Freq: Once | INTRAVENOUS | Status: DC
  Filled 2019-09-07: qty 200

## 2019-09-07 MED ORDER — HEPARIN SODIUM (PORCINE) 5000 UNIT/ML IJ SOLN
5000.0000 [IU] | Freq: Three times a day (TID) | INTRAMUSCULAR | Status: DC
  Administered 2019-09-07 – 2019-09-09 (×5): 5000 [IU] via SUBCUTANEOUS
  Filled 2019-09-07 (×6): qty 1

## 2019-09-07 MED ORDER — FUROSEMIDE 40 MG PO TABS
80.0000 mg | ORAL_TABLET | ORAL | Status: DC
  Administered 2019-09-09: 80 mg via ORAL
  Filled 2019-09-07 (×2): qty 2

## 2019-09-07 MED ORDER — SODIUM CHLORIDE 0.9% FLUSH: 3.0000 mL | INTRAVENOUS | Status: DC | PRN

## 2019-09-07 MED ORDER — SODIUM CHLORIDE 0.9 % IV SOLN
2.0000 g | Freq: Once | INTRAVENOUS | Status: AC
  Administered 2019-09-07: 2 g via INTRAVENOUS
  Filled 2019-09-07: qty 2

## 2019-09-07 MED ORDER — AMOXICILLIN-POT CLAVULANATE 500-125 MG PO TABS
1.0000 | ORAL_TABLET | ORAL | Status: DC
  Filled 2019-09-07: qty 1

## 2019-09-07 MED ORDER — AMLODIPINE BESYLATE 5 MG PO TABS
5.0000 mg | ORAL_TABLET | Freq: Every day | ORAL | Status: DC
  Administered 2019-09-07 – 2019-09-09 (×2): 5 mg via ORAL
  Filled 2019-09-07 (×2): qty 1

## 2019-09-07 MED ORDER — SODIUM CHLORIDE 0.9% FLUSH
3.0000 mL | Freq: Two times a day (BID) | INTRAVENOUS | Status: DC
  Administered 2019-09-07 – 2019-09-09 (×3): 3 mL via INTRAVENOUS

## 2019-09-07 MED ORDER — ONDANSETRON HCL 4 MG/2ML IJ SOLN: 4.0000 mg | Freq: Four times a day (QID) | INTRAMUSCULAR | Status: DC | PRN

## 2019-09-07 MED ORDER — GLUCERNA SHAKE PO LIQD
237.0000 mL | Freq: Three times a day (TID) | ORAL | Status: DC
  Administered 2019-09-07 – 2019-09-09 (×4): 237 mL via ORAL

## 2019-09-07 MED ORDER — INSULIN ASPART 100 UNIT/ML ~~LOC~~ SOLN: 0.0000 [IU] | Freq: Three times a day (TID) | SUBCUTANEOUS | Status: DC

## 2019-09-07 NOTE — ED Notes (Signed)
Vancomycin provided to dialysis RN. Pt in dialysis treatment at this time.

## 2019-09-07 NOTE — ED Notes (Signed)
X-ray at bedside

## 2019-09-07 NOTE — ED Notes (Signed)
Pt/ daughter-in-law offered refreshments. Pt provided milk per her request. Pt AOX4, resting and reports she is comfortable.

## 2019-09-07 NOTE — ED Provider Notes (Signed)
[facility] Emergency Department Provider Note  ____________________________________________   First MD Initiated Contact with Patient 09/07/19 [phone]     (approximate)  I have reviewed the triage vital signs and the nursing notes.   HISTORY  Chief Complaint Shortness of Breath and Chest Pain    HPI Amber Dixon is a 69 y.o. female with diabetes, hypertension, ESRD on dialysis who comes in for shortness of breath and chest pain.  On review of records patient had a recent admission in April and just discharged on 5/14.  Patient came in with acute metabolic encephalopathy with fever and started on broad-spectrum antibiotics.  Thought to be viral in nature.  Was also concerned that patient had aspiration during this event and was requiring high flow nasal cannula patient completed 5 days of Zosyn and discharged on 3 more days of Augmentin.  Patient was last dialyzed on Thursday.  She typically goes to dialysis Tuesdays Thursdays Saturdays.  They attempted to get dialysis yesterday, Saturday when they were told that they did not have a active prescription for dialysis.  They started to note that she seemed a little short of breath on Friday but gradually was getting worse and became more severe today, constant, nothing was making it better, nothing makes it worse.  She also has some chest pain associated with it.  No history of blood clots.  Not any blood thinners.  Family declined Spanish interpreted.  History was obtained by the daughter-in-law.      Past Medical History:  Diagnosis Date  . Diabetes mellitus without complication ([place])   . Hypertension   . Renal disorder     Patient Active Problem List   Diagnosis Date Noted  . Protein-calorie malnutrition, severe 08/22/2019  . Altered mental status   . DNR (do not resuscitate) discussion   . Palliative care by specialist   . Encephalopathy 08/16/2019  . ESRD (end stage renal disease) ([place])    . Fluid overload 06/09/2019  . Chest pain 06/09/2019  . Elevated troponin 06/09/2019  . Diabetes mellitus type 2 in nonobese ([place]) 06/09/2019  . Anemia of chronic disease 06/09/2019  . Nausea and vomiting 06/09/2019  . Pulmonary edema 06/09/2019  . ESRD (end stage renal disease) on dialysis ([place]) 04/10/2019  . Acute respiratory failure with hypoxemia ([place]) 03/31/2019  . Acute on chronic diastolic (congestive) heart failure ([place]) 03/24/2019  . Hypertension 03/22/2019  . Anemia of renal disease 03/22/2019  . Proteinuria 03/22/2019  . Chest pain 03/22/2019  . Acute on chronic renal failure ([place]) 03/21/2019    Past Surgical History:  Procedure Laterality Date  . AV FISTULA PLACEMENT Right 04/24/2019   Procedure: ARTERIOVENOUS (AV) FISTULA CREATION RIGHT ARM;  Surgeon: Waynetta Sandy, MD;  Location: [facility];  Service: Vascular;  Laterality: Right;  . AV FISTULA PLACEMENT Right   . HIP FRACTURE SURGERY Right    in [place]  . IR FLUORO GUIDE CV LINE RIGHT  04/02/2019  . IR FLUORO GUIDE CV LINE RIGHT  04/13/2019  . IR US GUIDE VASC ACCESS RIGHT  04/02/2019  . LEG SURGERY      Prior to Admission medications   Medication Sig Start Date End Date Taking? Authorizing Provider  amLODipine (NORVASC) 5 MG tablet Take 1 tablet (5 mg total) by mouth daily. 09/06/19   Danford, Suann Larry, MD  amoxicillin-clavulanate (AUGMENTIN) 875-125 MG tablet Take 1 tablet by mouth 2 (two) times daily for 3 days. 09/05/19 09/08/19  Danford, Suann Larry,  MD  blood glucose meter kit and supplies KIT Dispense based on patient and insurance preference. Use up to four times daily as directed. (FOR ICD-9 250.00, 250.01). 09/05/19   Danford, Suann Larry, MD  calcium-vitamin D (OSCAL WITH D) 500-200 MG-UNIT tablet Take 1 tablet by mouth daily with breakfast.    [provider]  furosemide (LASIX) 80 MG tablet Take 1 tablet (80 mg total) by mouth every other day. 09/06/19   Danford, Suann Larry, MD      Allergies Patient has no known allergies.  No family history on file.  Social History Social History   Tobacco Use  . Smoking status: Never Smoker  . Smokeless tobacco: Never Used  Substance Use Topics  . Alcohol use: Not on file  . Drug use: Not Currently      Review of Systems Constitutional: No fever/chills Eyes: No visual changes. ENT: No sore throat. Cardiovascular: Positive for chest pain Respiratory: Positive for SOB Gastrointestinal: No abdominal pain.  No nausea, no vomiting.  No diarrhea.  No constipation. Genitourinary: Negative for dysuria. Musculoskeletal: Negative for back pain. Skin: Negative for rash. Neurological: Negative for headaches, focal weakness or numbness. All other ROS negative ____________________________________________   PHYSICAL EXAM:  VITAL SIGNS: ED Triage Vitals  Enc Vitals Group     BP 09/07/19 0916 (!) 187/82     Pulse Rate 09/07/19 0916 96     Resp 09/07/19 0916 (!) 48     Temp --      Temp src --      SpO2 09/07/19 0916 (!) 81 %     Weight 09/07/19 0917 79 lb 12.8 oz (36.2 kg)     Height 09/07/19 0917 _0  (1.499 m)     Head Circumference --      Peak Flow --      Pain Score 09/07/19 0917 7     Pain Loc --      Pain Edu? --      Excl. in [place]? --     Constitutional: Alert and oriented. Well appearing and in no acute distress. Eyes: Conjunctivae are normal. EOMI. Head: Atraumatic. Nose: No congestion/rhinnorhea. Mouth/Throat: Mucous membranes are moist.   Neck: No stridor. Trachea Midline. FROM Cardiovascular: Normal rate, regular rhythm. Grossly normal heart sounds.  Good peripheral circulation.  Chest wall catheter noted in the right chest wall without any erythema Respiratory: Increased work of breathing, no wheezing noted placed on a nonrebreather Gastrointestinal: Soft and nontender. No distention. No abdominal bruits.  Musculoskeletal: No lower extremity tenderness nor edema.  No joint effusions.  Fistula  noted to the right upper arm with good thrill Neurologic:  Normal speech and language. No gross focal neurologic deficits are appreciated.  Skin:  Skin is warm, dry and intact. No rash noted. Psychiatric: Mood and affect are normal. Speech and behavior are normal. GU: Deferred   ____________________________________________   LABS (all labs ordered are listed, but only abnormal results are displayed)  Labs Reviewed  SARS CORONAVIRUS 2 BY RT PCR (HOSPITAL ORDER, [facility])  CBC WITH DIFFERENTIAL/PLATELET  COMPREHENSIVE METABOLIC PANEL  BRAIN NATRIURETIC PEPTIDE  PROCALCITONIN  LACTIC ACID, PLASMA  LACTIC ACID, PLASMA  BLOOD GAS, VENOUS  TROPONIN I (HIGH SENSITIVITY)   ____________________________________________   ED ECG REPORT I, Vanessa Wanette, the attending physician, personally viewed and interpreted this ECG.  Normal sinus rhythm 97, no ST elevation, no T wave inversions, normal intervals ____________________________________________  RADIOLOGY Robert Bellow,  personally viewed and evaluated these images (plain radiographs) as part of my medical decision making, as well as reviewing the written report by the radiologist.  ED MD interpretation: Fluid overload with bilateral congestion.  Official radiology report(s): DG Chest Portable 1 View  Result Date: 09/07/2019 CLINICAL DATA:  Pt missed her dialysis yesterday, was discharged from the hospital Friday, daughter reports she has been labored to breathe since Friday, pt is breathing 48 times a minute with a room air sat of 81%, pt also c/o left sided chest pain. PT placed on BIPAP. Pt had been admitted to [facility] for sepsis but discharged Friday. Hx - DM, ESRD, HTN, AV fistula placement 04/24/2019, non-smoker. COVID test pending but was negative 08/16/2019 upon last admission. EXAM: PORTABLE CHEST 1 VIEW COMPARISON:  09/01/2019 and earlier exams. FINDINGS: Bilateral airspace opacities, that are most  confluent in the lower lungs. Hemidiaphragms are obscured consistent with moderate bilateral effusions. Cardiac silhouette is enlarged. No pneumothorax. Right internal jugular tunneled dual lumen central venous catheter is stable. IMPRESSION: 1. Findings consistent with congestive heart failure/fluid overload with pulmonary edema and moderate bilateral effusions. Electronically Signed   By: Lajean Manes M.D.   On: 09/07/2019 09:59    ____________________________________________   PROCEDURES  Procedure(s) performed (including Critical Care):  .Critical Care Performed by: Vanessa Kincaid, MD Authorized by: Vanessa Cumings, MD   Critical care provider statement:    Critical care time (minutes):  45   Critical care was necessary to treat or prevent imminent or life-threatening deterioration of the following conditions:  Respiratory failure   Critical care was time spent personally by me on the following activities:  Discussions with consultants, evaluation of patient's response to treatment, examination of patient, ordering and performing treatments and interventions, ordering and review of laboratory studies, ordering and review of radiographic studies, pulse oximetry, re-evaluation of patient's condition, obtaining history from patient or surrogate and review of old charts     ____________________________________________   INITIAL IMPRESSION / [place] / ED COURSE   Amber Dixon was evaluated in Emergency Department on 09/07/2019 for the symptoms described in the history of present illness. She was evaluated in the context of the global COVID-19 pandemic, which necessitated consideration that the patient might be at risk for infection with the SARS-CoV-2 virus that causes COVID-19. Institutional protocols and algorithms that pertain to the evaluation of patients at risk for COVID-19 are in a state of rapid change based on information released by regulatory bodies  including the CDC and federal and state organizations. These policies and algorithms were followed during the patient's care in the ED.     Pt presents with SOB.  Most likely fluid overload in the setting of missing dialysis.  Patient placed on nonrebreather immediately given work of breathing and saturations in the 80s.  Respiratory therapy was immediately called to place patient on BiPAP due to my concern is secondary to fluid overload secondary to missing dialysis.  PNA-will get xray to evaluation Anemia-CBC to evaluate ACS- will get trops Arrhythmia-Will get EKG and keep on monitor.  COVID- will get testing per algorithm. PE-lower suspicion given no risk factors and other cause more likely  Placed on BiPAP and appears more comfortable.  Chest x-ray with bilateral infiltrates concerning for edema.  Dr. Juleen China know that pt was going to require dialysis.   Patient's K came back at 4.5.  Will need to move patient to room 17 to get dialysis while in the ER  given patient is on BiPAP and no stepdown or ICU beds immediately available.  Patient admitted to the hospitalist team for further management  Patient procalcitonin did come back elevated patient was currently getting treatment for aspiration pneumonia.  Given chest x-ray difficult to tell if any signs of continued pneumonia given the edema will just cover with some broad-spectrum antibiotics.  Otherwise she does not meet sepsis criteria   ____________________________________________   FINAL CLINICAL IMPRESSION(S) / ED DIAGNOSES   Final diagnoses:  Acute respiratory failure with hypoxia (HCC)  ESRD (end stage renal disease) ([place])     MEDICATIONS GIVEN DURING THIS VISIT:  Medications  ceFEPIme (MAXIPIME) 2 g in sodium chloride 0.9 % 100 mL IVPB (2 g Intravenous New Bag/Given 09/07/19 1148)  acetaminophen (TYLENOL) tablet 650 mg (has no administration in time range)  amLODipine (NORVASC) tablet 5 mg (has no administration in time  range)  furosemide (LASIX) tablet 80 mg (has no administration in time range)  sodium chloride flush (NS) 0.9 % injection 3 mL ( Intravenous Canceled Entry 09/07/19 1148)  sodium chloride flush (NS) 0.9 % injection 3 mL (has no administration in time range)  0.9 %  sodium chloride infusion (has no administration in time range)  ondansetron (ZOFRAN) injection 4 mg (has no administration in time range)  heparin injection 5,000 Units (has no administration in time range)  amoxicillin-clavulanate (AUGMENTIN) 875-125 MG per tablet 1 tablet (has no administration in time range)  vancomycin (VANCOCIN) IVPB 1000 mg/200 mL premix (has no administration in time range)     ED Discharge Orders    None       Note:  This document was prepared using Dragon voice recognition software and may include unintentional dictation errors.   Vanessa Tippecanoe, MD 09/07/19 [phone]

## 2019-09-07 NOTE — Progress Notes (Signed)
CODE SEPSIS - PHARMACY COMMUNICATION  **Broad Spectrum Antibiotics should be administered within 1 hour of Sepsis diagnosis**  Time Code Sepsis Called/Page Received: 1128  Antibiotics Ordered: Cefepime + vancomycin  Time of 1st antibiotic administration: 1148  Additional action taken by pharmacy: Patient is to go for emergent HD- re-timed vancomycin to be given with last hour of dialysis. Cefepime already administered (68%) removed by hemodialysis. Augmentin ordered to start tomorrow.   [place] Resident 09/07/2019  12:02 PM

## 2019-09-07 NOTE — ED Notes (Signed)
Medications held pt to get dialysis

## 2019-09-07 NOTE — ED Notes (Signed)
Report from dialysis RN recieved

## 2019-09-07 NOTE — ED Notes (Signed)
RT at bedside to place pt on BIPAP

## 2019-09-07 NOTE — ED Triage Notes (Signed)
Pt missed her dialysis yesterday, was discharged from the hospital Friday, daughter reports she has been labored to breathe since Friday, pt is breathing 48 times a minute with a room air sat of 81%, pt also c/o left sided chest pain

## 2019-09-07 NOTE — Consult Note (Signed)
PHARMACY -  BRIEF ANTIBIOTIC NOTE   Pharmacy has received consult(s) for cefepime + vancomycin from an ED provider.  The patient's profile has been reviewed for ht/wt/allergies/indication/available labs. NKDA  Patient is a 69 y/o F with history of ESRD on HD T/Th/Sa, diabetes, hypertension who presents with tachypnea , hypoxia , chest pain. Missed dialysis yesterday. She was recently hospitalized 4/24-5/14 with encephalopathy. She received treatment with broad spectrum antibiotics during last admission. Today she is hypothermic, hypertensive, no leukocytosis, lactate normal, BNP 1353. CXR impression of pulmonary edema.  One time order(s) placed for -Cefepime 2 g (already ordered) -Vancomycin 1 g (already ordered)  Further antibiotics/pharmacy consults should be ordered by admitting physician if indicated.                       Thank you, [place] Resident 09/07/2019  11:33 AM

## 2019-09-07 NOTE — ED Notes (Addendum)
Pt removed [place] and oxygen dropped to 60%, pt appears in respiratory distress and is pulling down brief. Placed oxygen back on pt at 4 liter's via . Pt requesting BGL. Blood sugar checked for pt, pt assisted with bedpan, and education provided on oxygenation. Pt verbalizes understanding. Translator utilized.

## 2019-09-07 NOTE — H&P (Addendum)
History and Physical    Amber Dixon CHY:[id number] DOB: 11-May-1950 DOA: 09/07/2019  PCP: Patient, No Pcp Per   Patient coming from: Home  I have personally briefly reviewed patient's old medical records in [place]  Chief Complaint: Chest pain and shortness of breath  HPI: Amber Dixon is a 69 y.o. female with medical history significant for end-stage renal disease on hemodialysis (dialysis days T/TH/S), diabetes mellitus with complications of end-stage renal disease, recent hospitalization for metabolic encephalopathy and severe protein calorie malnutrition.  Patient was recently discharged from the hospital after dialysis on Thursday following treatment for aspiration pneumonia.  She had gone to dialysis on 05/15 and was unable to get dialyzed.  She presents to the emergency room via EMS for evaluation of shortness of breath and was found to have increased work of breathing and room air pulse oximetry of 81%. She also complained of left-sided chest pain.  She was placed on BiPAP to reduce work of breathing. She denies having any nausea, vomiting, palpitations or diaphoresis. Chest x-ray showed findings consistent with congestive heart failure/fluid overload with pulmonary edema and moderate bilateral effusions. 12 Lead EKG shows normal sinus rhythm with no acute findings Initial troponin is within normal limits.   ED Course: Patient is a 69 year old female who presents to the emergency room with increased work of breathing and hypoxia requiring noninvasive mechanical ventilation.  Chest x-ray is consistent with fluid overload.  Nephrology has been consulted and patient will need dialysis.  Review of Systems: As per HPI otherwise 10 point review of systems negative.    Past Medical History:  Diagnosis Date  . Diabetes mellitus without complication ([place])   . Hypertension   . Renal disorder     Past Surgical History:  Procedure Laterality Date    . AV FISTULA PLACEMENT Right 04/24/2019   Procedure: ARTERIOVENOUS (AV) FISTULA CREATION RIGHT ARM;  Surgeon: Waynetta Sandy, MD;  Location: [place];  Service: Vascular;  Laterality: Right;  . AV FISTULA PLACEMENT Right   . HIP FRACTURE SURGERY Right    in [place]  . IR FLUORO GUIDE CV LINE RIGHT  04/02/2019  . IR FLUORO GUIDE CV LINE RIGHT  04/13/2019  . IR US GUIDE VASC ACCESS RIGHT  04/02/2019  . LEG SURGERY       reports that she has never smoked. She has never used smokeless tobacco. She reports previous drug use. No history on file for alcohol.  No Known Allergies  No family history on file.   Prior to Admission medications   Medication Sig Start Date End Date Taking? Authorizing Provider  amLODipine (NORVASC) 5 MG tablet Take 1 tablet (5 mg total) by mouth daily. 09/06/19   Danford, Suann Larry, MD  amoxicillin-clavulanate (AUGMENTIN) 875-125 MG tablet Take 1 tablet by mouth 2 (two) times daily for 3 days. 09/05/19 09/08/19  Danford, Suann Larry, MD  blood glucose meter kit and supplies KIT Dispense based on patient and insurance preference. Use up to four times daily as directed. (FOR ICD-9 250.00, 250.01). 09/05/19   Danford, Suann Larry, MD  calcium-vitamin D (OSCAL WITH D) 500-200 MG-UNIT tablet Take 1 tablet by mouth daily with breakfast.    [provider]  furosemide (LASIX) 80 MG tablet Take 1 tablet (80 mg total) by mouth every other day. 09/06/19   Edwin Dada, MD    Physical Exam: Vitals:   09/07/19 0945 09/07/19 1000 09/07/19 1015 09/07/19 1030  BP: (!) 172/85 Marland Kitchen)  175/86  (!) 146/129  Pulse: 93 (!) 31 88 89  Resp: (!) 23 20 (!) 24 20  Temp:      TempSrc:      SpO2: 99% 99% 100% 100%  Weight:      Height:         Vitals:   09/07/19 0945 09/07/19 1000 09/07/19 1015 09/07/19 1030  BP: (!) 172/85 (!) 175/86  (!) 146/129  Pulse: 93 (!) 31 88 89  Resp: (!) 23 20 (!) 24 20  Temp:      TempSrc:      SpO2: 99% 99% 100% 100%   Weight:      Height:        Constitutional: NAD, alert and oriented x 3.  Chronically ill-appearing I would like real food Eyes: PERRL, lids and conjunctivae pale ENMT: Mucous membranes are moist.  Neck: normal, supple, no masses, no thyromegaly Respiratory: clear to auscultation bilaterally, no wheezing, no crackles. Normal respiratory effort. No accessory muscle use.  Cardiovascular: Regular rate and rhythm, no murmurs / rubs / gallops. No extremity edema. 2+ pedal pulses. No carotid bruits.  Abdomen: no tenderness, no masses palpated. No hepatosplenomegaly. Bowel sounds positive.  Musculoskeletal: no clubbing / cyanosis. No joint deformity upper and lower extremities.  Skin: no rashes, lesions, ulcers.  Neurologic: No gross focal neurologic deficit. Psychiatric: Normal mood and affect.   Labs on Admission: I have personally reviewed following labs and imaging studies  CBC: Recent Labs  Lab 09/02/19 0520 09/03/19 0600 09/04/19 0519 09/05/19 0414 09/07/19 0926  WBC 12.7* 12.1* 12.2* 10.8* 8.6  NEUTROABS 12.3* 11.8*  --   --  7.2  HGB 9.5* 8.5* 8.0* 9.1* 8.9*  HCT 27.7* 24.5* 24.2* 27.2* 26.9*  MCV 82.2 81.7 84.0 83.7 83.3  PLT 260 254 275 325 093   Basic Metabolic Panel: Recent Labs  Lab 09/02/19 0520 09/03/19 0600 09/04/19 0519 09/05/19 0414 09/07/19 0926  NA 127* 130* 131* 137 131*  K 6.5* 4.7 5.6* 3.7 4.5  CL 86* 90* 90* 94* 88*  CO2 28 27 27 30 25   GLUCOSE 155* 258* 138* 136* 116*  BUN 63* 48* 72* 32* 57*  CREATININE 4.70* 3.67* 4.51* 2.26* 4.50*  CALCIUM 7.7* 7.8* 7.5* 7.3* 8.0*  PHOS  --   --  6.7*  --   --    GFR: Estimated Creatinine Clearance: 6.6 mL/min (A) (by C-G formula based on SCr of 4.5 mg/dL (H)). Liver Function Tests: Recent Labs  Lab 09/01/19 0210 09/04/19 0519 09/07/19 0926  AST 36  --  20  ALT 31  --  26  ALKPHOS 99  --  95  BILITOT 0.4  --  0.9  PROT 6.1*  --  6.9  ALBUMIN 2.7* 2.7* 3.0*   Recent Labs  Lab 09/01/19 0210   LIPASE 40   No results for input(s): AMMONIA in the last 168 hours. Coagulation Profile: No results for input(s): INR, PROTIME in the last 168 hours. Cardiac Enzymes: No results for input(s): CKTOTAL, CKMB, CKMBINDEX, TROPONINI in the last 168 hours. BNP (last 3 results) No results for input(s): PROBNP in the last 8760 hours. HbA1C: No results for input(s): HGBA1C in the last 72 hours. CBG: Recent Labs  Lab 09/04/19 1306 09/04/19 1656 09/04/19 2218 09/05/19 0731 09/05/19 1115  GLUCAP 98 192* 126* 108* 145*   Lipid Profile: No results for input(s): CHOL, HDL, LDLCALC, TRIG, CHOLHDL, LDLDIRECT in the last 72 hours. Thyroid Function Tests: No results for input(s): TSH,  T4TOTAL, FREET4, T3FREE, THYROIDAB in the last 72 hours. Anemia Panel: No results for input(s): VITAMINB12, FOLATE, FERRITIN, TIBC, IRON, RETICCTPCT in the last 72 hours. Urine analysis:    Component Value Date/Time   COLORURINE YELLOW (A) 09/01/2019 0442   APPEARANCEUR HAZY (A) 09/01/2019 0442   LABSPEC 1.015 09/01/2019 0442   PHURINE 9.0 (H) 09/01/2019 0442   GLUCOSEU >=500 (A) 09/01/2019 0442   HGBUR NEGATIVE 09/01/2019 0442   BILIRUBINUR NEGATIVE 09/01/2019 0442   KETONESUR NEGATIVE 09/01/2019 0442   PROTEINUR >=300 (A) 09/01/2019 0442   NITRITE NEGATIVE 09/01/2019 0442   LEUKOCYTESUR NEGATIVE 09/01/2019 0442    Radiological Exams on Admission: DG Chest Portable 1 View  Result Date: 09/07/2019 CLINICAL DATA:  Pt missed her dialysis yesterday, was discharged from the hospital Friday, daughter reports she has been labored to breathe since Friday, pt is breathing 48 times a minute with a room air sat of 81%, pt also c/o left sided chest pain. PT placed on BIPAP. Pt had been admitted to [facility] for sepsis but discharged Friday. Hx - DM, ESRD, HTN, AV fistula placement 04/24/2019, non-smoker. COVID test pending but was negative 08/16/2019 upon last admission. EXAM: PORTABLE CHEST 1 VIEW COMPARISON:  09/01/2019  and earlier exams. FINDINGS: Bilateral airspace opacities, that are most confluent in the lower lungs. Hemidiaphragms are obscured consistent with moderate bilateral effusions. Cardiac silhouette is enlarged. No pneumothorax. Right internal jugular tunneled dual lumen central venous catheter is stable. IMPRESSION: 1. Findings consistent with congestive heart failure/fluid overload with pulmonary edema and moderate bilateral effusions. Electronically Signed   By: Lajean Manes M.D.   On: 09/07/2019 09:59    EKG: Independently reviewed.   Sinus rhythm  Assessment/Plan Principal Problem:   Acute on chronic diastolic (congestive) heart failure (HCC) Active Problems:   Hypertension   Anemia of renal disease   Acute respiratory failure with hypoxemia (HCC)   ESRD (end stage renal disease) on dialysis (HCC)   Diabetes mellitus type 2 in nonobese (HCC)   Protein-calorie malnutrition, severe     Acute on chronic diastolic CHF Secondary to hypertensive urgency and missed renal replacement therapy Chest x ray shows findings consistent with congestive heart failure/fluid overload with pulmonary edema and moderate bilateral effusions. Optimize blood pressure control Consult nephrology for urgent renal replacement therapy   Acute respiratory failure with hypoxemia Secondary to chronic diastolic dysfunction CHF and missed dialysis Patient presented with increased work of breathing, tachypnea and use of accessory muscles and is currently on BiPAP We will attempt to wean off oxygen as tolerated   End-stage renal disease Dialysis days are T/TH/S Will request nephrology consult   Diabetes mellitus with complications of chronic kidney disease Place patient on consistent carbohydrate diet Sliding scale coverage for glycemic control   Severe protein calorie malnutrition (BMI 16.12) Continue nutritional supplement Dietitian consult  Patient was recently hospitalized for aspiration pneumonia  and was discharged home on Augmentin.  She does have an elevated procalcitonin level but has a normal white count and is afebrile.  Will complete oral antibiotic therapy during this hospitalization   DVT prophylaxis:  Heparin Code Status: Full code Family Communication: Greater than 50% of time was spent discussing patient's condition and plan of care with her son over the phone and his wife at the bedside.  All questions and concerns have been addressed.  She is a full code. Disposition Plan: Back to previous home environment Consults called: Nephrology    Doroteo Nickolson MD [facility]  09/07/2019, 11:25 AM

## 2019-09-07 NOTE — Progress Notes (Signed)
[facility]  ROUNDING NOTE   Subjective:   Paitent was discharged from [facility] from 4/24 to 5/00 for complicated admission. Patient went to her new dialysis unit in [place] but apparently there was no paperwork so patient was asked to come back on Monday. Patient presents now with shortness of breath and chest pain. She was placed on bipap. Nephrology called for emergent dialysis.   Objective:  Vital signs in last 24 hours:  Temp:  [97.5 F (36.4 C)] 97.5 F (36.4 C) (05/16 0916) Pulse Rate:  [31-96] 91 (05/16 1300) Resp:  [11-48] 26 (05/16 1300) BP: (133-187)/(82-129) 155/86 (05/16 1300) SpO2:  [81 %-100 %] 94 % (05/16 1300) Weight:  [36.2 kg] 36.2 kg (05/16 0917)  Weight change:  Filed Weights   09/07/19 0917  Weight: 36.2 kg    Intake/Output: No intake/output data recorded.   Intake/Output this shift:  No intake/output data recorded.  Physical Exam: General: Critically ill  Head: Normocephalic, atraumatic. Moist oral mucosal membranes  Eyes: Anicteric, PERRL  Neck: Supple, trachea midline  Lungs:  Bilateral crackles, +BIPAP  Heart: Regular rate and rhythm  Abdomen:  Soft, nontender,   Extremities: trace peripheral edema.  Neurologic: Nonfocal, moving all four extremities  Skin: No lesions  Access: Right arm AVF + bruit and +thrill RIJ permcath    Basic Metabolic Panel: Recent Labs  Lab 09/02/19 0520 09/02/19 0520 09/03/19 0600 09/03/19 0600 09/04/19 0519 09/05/19 0414 09/07/19 0926  NA 127*  --  130*  --  131* 137 131*  K 6.5*  --  4.7  --  5.6* 3.7 4.5  CL 86*  --  90*  --  90* 94* 88*  CO2 28  --  27  --  27 30 25   GLUCOSE 155*  --  258*  --  138* 136* 116*  BUN 63*  --  48*  --  72* 32* 57*  CREATININE 4.70*  --  3.67*  --  4.51* 2.26* 4.50*  CALCIUM 7.7*   < > 7.8*   < > 7.5* 7.3* 8.0*  PHOS  --   --   --   --  6.7*  --   --    < > = values in this interval not displayed.    Liver Function Tests: Recent Labs  Lab 09/01/19 0210  09/04/19 0519 09/07/19 0926  AST 36  --  20  ALT 31  --  26  ALKPHOS 99  --  95  BILITOT 0.4  --  0.9  PROT 6.1*  --  6.9  ALBUMIN 2.7* 2.7* 3.0*   Recent Labs  Lab 09/01/19 0210  LIPASE 40   No results for input(s): AMMONIA in the last 168 hours.  CBC: Recent Labs  Lab 09/02/19 0520 09/03/19 0600 09/04/19 0519 09/05/19 0414 09/07/19 0926  WBC 12.7* 12.1* 12.2* 10.8* 8.6  NEUTROABS 12.3* 11.8*  --   --  7.2  HGB 9.5* 8.5* 8.0* 9.1* 8.9*  HCT 27.7* 24.5* 24.2* 27.2* 26.9*  MCV 82.2 81.7 84.0 83.7 83.3  PLT 260 254 275 325 390    Cardiac Enzymes: No results for input(s): CKTOTAL, CKMB, CKMBINDEX, TROPONINI in the last 168 hours.  BNP: Invalid input(s): POCBNP  CBG: Recent Labs  Lab 09/04/19 1306 09/04/19 1656 09/04/19 2218 09/05/19 0731 09/05/19 1115  GLUCAP 98 192* 126* 108* 145*    Microbiology: Results for orders placed or performed during the hospital encounter of 09/07/19  SARS Coronavirus 2 by RT PCR (hospital order,  performed in [facility] hospital lab) Nasopharyngeal Nasopharyngeal Swab     Status: None   Collection Time: 09/07/19  9:26 AM   Specimen: Nasopharyngeal Swab  Result Value Ref Range Status   SARS Coronavirus 2 NEGATIVE NEGATIVE Final    Comment: (NOTE) SARS-CoV-2 target nucleic acids are NOT DETECTED. The SARS-CoV-2 RNA is generally detectable in upper and lower respiratory specimens during the acute phase of infection. The lowest concentration of SARS-CoV-2 viral copies this assay can detect is 250 copies / mL. A negative result does not preclude SARS-CoV-2 infection and should not be used as the sole basis for treatment or other patient management decisions.  A negative result may occur with improper specimen collection / handling, submission of specimen other than nasopharyngeal swab, presence of viral mutation(s) within the areas targeted by this assay, and inadequate number of viral copies (<250 copies / mL). A negative  result must be combined with clinical observations, patient history, and epidemiological information. Fact Sheet for Patients:   StrictlyIdeas.no Fact Sheet for Healthcare Providers: BankingDealers.co.za This test is not yet approved or cleared  by the [place] FDA and has been authorized for detection and/or diagnosis of SARS-CoV-2 by FDA under an Emergency Use Authorization (EUA).  This EUA will remain in effect (meaning this test can be used) for the duration of the COVID-19 declaration under Section 564(b)(1) of the Act, 21 U.S.C. section 360bbb-3(b)(1), unless the authorization is terminated or revoked sooner. Performed at [facility], [address] [place], [place] [address]     Coagulation Studies: No results for input(s): LABPROT, INR in the last 72 hours.  Urinalysis: No results for input(s): COLORURINE, LABSPEC, PHURINE, GLUCOSEU, HGBUR, BILIRUBINUR, KETONESUR, PROTEINUR, UROBILINOGEN, NITRITE, LEUKOCYTESUR in the last 72 hours.  Invalid input(s): APPERANCEUR    Imaging: DG Chest Portable 1 View  Result Date: 09/07/2019 CLINICAL DATA:  Pt missed her dialysis yesterday, was discharged from the hospital Friday, daughter reports she has been labored to breathe since Friday, pt is breathing 48 times a minute with a room air sat of 81%, pt also c/o left sided chest pain. PT placed on BIPAP. Pt had been admitted to [facility] for sepsis but discharged Friday. Hx - DM, ESRD, HTN, AV fistula placement 04/24/2019, non-smoker. COVID test pending but was negative 08/16/2019 upon last admission. EXAM: PORTABLE CHEST 1 VIEW COMPARISON:  09/01/2019 and earlier exams. FINDINGS: Bilateral airspace opacities, that are most confluent in the lower lungs. Hemidiaphragms are obscured consistent with moderate bilateral effusions. Cardiac silhouette is enlarged. No pneumothorax. Right internal jugular tunneled dual lumen central venous  catheter is stable. IMPRESSION: 1. Findings consistent with congestive heart failure/fluid overload with pulmonary edema and moderate bilateral effusions. Electronically Signed   By: Lajean Manes M.D.   On: 09/07/2019 09:59     Medications:   . sodium chloride    . vancomycin     . amLODipine  5 mg Oral Daily  . [START ON 09/08/2019] amoxicillin-clavulanate  1 tablet Oral BID  . furosemide  80 mg Oral QODAY  . heparin  5,000 Units Subcutaneous Q8H  . sodium chloride flush  3 mL Intravenous Q12H   sodium chloride, acetaminophen, ondansetron (ZOFRAN) IV, sodium chloride flush  Assessment/ Plan:  Ms. Carnelia Oscar is a 69 y.o. Hispanic female with end stage renal disease on hemodialysis, diabetes mellitus, hypertension who was admitted today, 09/07/2019 , for Acute on chronic diastolic CHF (congestive heart failure) ([place]) [I50.33]   [facility] R[facility]  permcath  1. End Stage renal disease: emergent dialysis for today. Use CVC  2. Hypertension: elevated. 155/86  3. Acute respiratory failure requiring noninvasive mechanical ventilation.  Secondary to pulmonary edema and pneumonia.  - Emergent dialysis as above.  - Empiric antibiotics.   4. Anemia of chronic kidney disease: hemoglobin 8.9. EPO on her scheduled dialysis days.     LOS: 0 Sarath Kolluru 5/16/20211:23 PM

## 2019-09-07 NOTE — ED Notes (Signed)
Report given to to receiving stepdown RN, RN transportation requested.

## 2019-09-07 NOTE — ED Notes (Signed)
Dialysis contacted and should be able to get patient started for treatment in about 2 hours.

## 2019-09-07 NOTE — ED Notes (Signed)
Report given to Lexie, RN 

## 2019-09-08 DIAGNOSIS — N186 End stage renal disease: Secondary | ICD-10-CM | POA: Diagnosis present

## 2019-09-08 DIAGNOSIS — Z9115 Patient's noncompliance with renal dialysis: Secondary | ICD-10-CM | POA: Diagnosis not present

## 2019-09-08 DIAGNOSIS — I160 Hypertensive urgency: Secondary | ICD-10-CM | POA: Diagnosis present

## 2019-09-08 DIAGNOSIS — Z79899 Other long term (current) drug therapy: Secondary | ICD-10-CM | POA: Diagnosis not present

## 2019-09-08 DIAGNOSIS — J9601 Acute respiratory failure with hypoxia: Secondary | ICD-10-CM | POA: Diagnosis present

## 2019-09-08 DIAGNOSIS — I5033 Acute on chronic diastolic (congestive) heart failure: Secondary | ICD-10-CM | POA: Diagnosis present

## 2019-09-08 DIAGNOSIS — D631 Anemia in chronic kidney disease: Secondary | ICD-10-CM | POA: Diagnosis present

## 2019-09-08 DIAGNOSIS — Z992 Dependence on renal dialysis: Secondary | ICD-10-CM | POA: Diagnosis not present

## 2019-09-08 DIAGNOSIS — Z681 Body mass index (BMI) 19 or less, adult: Secondary | ICD-10-CM | POA: Diagnosis not present

## 2019-09-08 DIAGNOSIS — E1122 Type 2 diabetes mellitus with diabetic chronic kidney disease: Secondary | ICD-10-CM | POA: Diagnosis present

## 2019-09-08 DIAGNOSIS — Z20822 Contact with and (suspected) exposure to covid-19: Secondary | ICD-10-CM | POA: Diagnosis present

## 2019-09-08 DIAGNOSIS — J690 Pneumonitis due to inhalation of food and vomit: Secondary | ICD-10-CM | POA: Diagnosis present

## 2019-09-08 DIAGNOSIS — E43 Unspecified severe protein-calorie malnutrition: Secondary | ICD-10-CM | POA: Diagnosis present

## 2019-09-08 DIAGNOSIS — I132 Hypertensive heart and chronic kidney disease with heart failure and with stage 5 chronic kidney disease, or end stage renal disease: Secondary | ICD-10-CM | POA: Diagnosis not present

## 2019-09-08 LAB — GLUCOSE, CAPILLARY
Glucose-Capillary: 116 mg/dL — ABNORMAL HIGH (ref 70–99)
Glucose-Capillary: 69 mg/dL — ABNORMAL LOW (ref 70–99)
Glucose-Capillary: 120 mg/dL — ABNORMAL HIGH (ref 70–99)
Glucose-Capillary: 97 mg/dL (ref 70–99)
Glucose-Capillary: 107 mg/dL — ABNORMAL HIGH (ref 70–99)

## 2019-09-08 LAB — BASIC METABOLIC PANEL
Anion gap: 13 (ref 5–15)
BUN: 25 mg/dL — ABNORMAL HIGH (ref 8–23)
CO2: 29 mmol/L (ref 22–32)
Calcium: 7.7 mg/dL — ABNORMAL LOW (ref 8.9–10.3)
Chloride: 93 mmol/L — ABNORMAL LOW (ref 98–111)
Creatinine, Ser: 2.83 mg/dL — ABNORMAL HIGH (ref 0.44–1.00)
GFR calc Af Amer: 19 mL/min — ABNORMAL LOW (ref >60)
GFR calc non Af Amer: 16 mL/min — ABNORMAL LOW (ref >60)
Glucose, Bld: 73 mg/dL (ref 70–99)
Potassium: 4.3 mmol/L (ref 3.5–5.1)
Sodium: 135 mmol/L (ref 135–145)

## 2019-09-08 LAB — PROCALCITONIN: Procalcitonin: 2.29 ng/mL

## 2019-09-08 MED ORDER — CHLORHEXIDINE GLUCONATE CLOTH 2 % EX PADS
6.0000 | MEDICATED_PAD | Freq: Every day | CUTANEOUS | Status: DC
  Administered 2019-09-08 – 2019-09-09 (×2): 6 via TOPICAL

## 2019-09-08 MED ORDER — MORPHINE SULFATE (PF) 2 MG/ML IV SOLN
2.0000 mg | INTRAVENOUS | Status: DC | PRN
  Administered 2019-09-08: 2 mg via INTRAVENOUS
  Filled 2019-09-08: qty 1

## 2019-09-08 MED ORDER — CARVEDILOL 6.25 MG PO TABS
6.2500 mg | ORAL_TABLET | Freq: Two times a day (BID) | ORAL | Status: DC
  Administered 2019-09-08 – 2019-09-09 (×2): 6.25 mg via ORAL
  Filled 2019-09-08 (×2): qty 1

## 2019-09-08 MED ORDER — AMOXICILLIN-POT CLAVULANATE 500-125 MG PO TABS
1.0000 | ORAL_TABLET | Freq: Two times a day (BID) | ORAL | Status: DC
  Administered 2019-09-08 – 2019-09-09 (×3): 500 mg via ORAL
  Filled 2019-09-08 (×5): qty 1

## 2019-09-08 NOTE — Progress Notes (Signed)
[facility]  ROUNDING NOTE   Subjective:   Emergent hemodialysis treatment yesterday in the ED. UF of 2.3 liters. Patient was weaned off BIPAP and is now on 4 liters [place].   Continues to complain of shortness of breath and peripheral edema  Objective:  Vital signs in last 24 hours:  Temp:  [97.5 F (36.4 C)-98.6 F (37 C)] 98.5 F (36.9 C) (05/17 0400) Pulse Rate:  [31-107] 86 (05/17 0719) Resp:  [11-48] 25 (05/17 0600) BP: (107-192)/(65-129) 165/70 (05/17 0600) SpO2:  [81 %-100 %] 100 % (05/17 0719) FiO2 (%):  [36 %] 36 % (05/17 0312) Weight:  [35.6 kg-36.2 kg] 35.6 kg (05/17 0130)  Weight change:  Filed Weights   09/07/19 0917 09/08/19 0130  Weight: 36.2 kg 35.6 kg    Intake/Output: I/O last 3 completed shifts: In: -  Out: 2300 [Other:2300]   Intake/Output this shift:  No intake/output data recorded.  Physical Exam: General: Critically ill  Head: Normocephalic, atraumatic. Moist oral mucosal membranes  Eyes: Anicteric, PERRL  Neck: Supple, trachea midline  Lungs:  Bilateral crackles   Heart: Regular rate and rhythm  Abdomen:  Soft, nontender,   Extremities: trace peripheral edema.  Neurologic: Nonfocal, moving all four extremities  Skin: No lesions  Access: Right arm AVF + bruit and +thrill RIJ permcath    Basic Metabolic Panel: Recent Labs  Lab 09/03/19 0600 09/03/19 0600 09/04/19 0519 09/04/19 0519 09/05/19 0414 09/07/19 0926 09/08/19 0505  NA 130*  --  131*  --  137 131* 135  K 4.7  --  5.6*  --  3.7 4.5 4.3  CL 90*  --  90*  --  94* 88* 93*  CO2 27  --  27  --  30 25 29   GLUCOSE 258*  --  138*  --  136* 116* 73  BUN 48*  --  72*  --  32* 57* 25*  CREATININE 3.67*  --  4.51*  --  2.26* 4.50* 2.83*  CALCIUM 7.8*   < > 7.5*   < > 7.3* 8.0* 7.7*  PHOS  --   --  6.7*  --   --   --   --    < > = values in this interval not displayed.    Liver Function Tests: Recent Labs  Lab 09/04/19 0519 09/07/19 0926  AST  --  20  ALT  --   26  ALKPHOS  --  95  BILITOT  --  0.9  PROT  --  6.9  ALBUMIN 2.7* 3.0*   No results for input(s): LIPASE, AMYLASE in the last 168 hours. No results for input(s): AMMONIA in the last 168 hours.  CBC: Recent Labs  Lab 09/02/19 0520 09/03/19 0600 09/04/19 0519 09/05/19 0414 09/07/19 0926  WBC 12.7* 12.1* 12.2* 10.8* 8.6  NEUTROABS 12.3* 11.8*  --   --  7.2  HGB 9.5* 8.5* 8.0* 9.1* 8.9*  HCT 27.7* 24.5* 24.2* 27.2* 26.9*  MCV 82.2 81.7 84.0 83.7 83.3  PLT 260 254 275 325 390    Cardiac Enzymes: No results for input(s): CKTOTAL, CKMB, CKMBINDEX, TROPONINI in the last 168 hours.  BNP: Invalid input(s): POCBNP  CBG: Recent Labs  Lab 09/05/19 0731 09/05/19 1115 09/07/19 1801 09/07/19 2033 09/08/19 0727  GLUCAP 108* 145* 81 119* 54*    Microbiology: Results for orders placed or performed during the hospital encounter of 09/07/19  SARS Coronavirus 2 by RT PCR (hospital order, performed in [facility] hospital lab)  Nasopharyngeal Nasopharyngeal Swab     Status: None   Collection Time: 09/07/19  9:26 AM   Specimen: Nasopharyngeal Swab  Result Value Ref Range Status   SARS Coronavirus 2 NEGATIVE NEGATIVE Final    Comment: (NOTE) SARS-CoV-2 target nucleic acids are NOT DETECTED. The SARS-CoV-2 RNA is generally detectable in upper and lower respiratory specimens during the acute phase of infection. The lowest concentration of SARS-CoV-2 viral copies this assay can detect is 250 copies / mL. A negative result does not preclude SARS-CoV-2 infection and should not be used as the sole basis for treatment or other patient management decisions.  A negative result may occur with improper specimen collection / handling, submission of specimen other than nasopharyngeal swab, presence of viral mutation(s) within the areas targeted by this assay, and inadequate number of viral copies (<250 copies / mL). A negative result must be combined with clinical observations, patient  history, and epidemiological information. Fact Sheet for Patients:   StrictlyIdeas.no Fact Sheet for Healthcare Providers: BankingDealers.co.za This test is not yet approved or cleared  by the [place] FDA and has been authorized for detection and/or diagnosis of SARS-CoV-2 by FDA under an Emergency Use Authorization (EUA).  This EUA will remain in effect (meaning this test can be used) for the duration of the COVID-19 declaration under Section 564(b)(1) of the Act, 21 U.S.C. section 360bbb-3(b)(1), unless the authorization is terminated or revoked sooner. Performed at [facility], [address] [place], [place] [address]     Coagulation Studies: No results for input(s): LABPROT, INR in the last 72 hours.  Urinalysis: No results for input(s): COLORURINE, LABSPEC, PHURINE, GLUCOSEU, HGBUR, BILIRUBINUR, KETONESUR, PROTEINUR, UROBILINOGEN, NITRITE, LEUKOCYTESUR in the last 72 hours.  Invalid input(s): APPERANCEUR    Imaging: DG Chest Portable 1 View  Result Date: 09/07/2019 CLINICAL DATA:  Pt missed her dialysis yesterday, was discharged from the hospital Friday, daughter reports she has been labored to breathe since Friday, pt is breathing 48 times a minute with a room air sat of 81%, pt also c/o left sided chest pain. PT placed on BIPAP. Pt had been admitted to [facility] for sepsis but discharged Friday. Hx - DM, ESRD, HTN, AV fistula placement 04/24/2019, non-smoker. COVID test pending but was negative 08/16/2019 upon last admission. EXAM: PORTABLE CHEST 1 VIEW COMPARISON:  09/01/2019 and earlier exams. FINDINGS: Bilateral airspace opacities, that are most confluent in the lower lungs. Hemidiaphragms are obscured consistent with moderate bilateral effusions. Cardiac silhouette is enlarged. No pneumothorax. Right internal jugular tunneled dual lumen central venous catheter is stable. IMPRESSION: 1. Findings consistent with  congestive heart failure/fluid overload with pulmonary edema and moderate bilateral effusions. Electronically Signed   By: Lajean Manes M.D.   On: 09/07/2019 09:59     Medications:   . sodium chloride     . amLODipine  5 mg Oral Daily  . amoxicillin-clavulanate  1 tablet Oral Q24H  . [START ON 09/09/2019] amoxicillin-clavulanate  1 tablet Oral Q T,Th,Sa-HD  . Chlorhexidine Gluconate Cloth  6 each Topical Daily  . feeding supplement (GLUCERNA SHAKE)  237 mL Oral TID BM  . furosemide  80 mg Oral QODAY  . heparin  5,000 Units Subcutaneous Q8H  . insulin aspart  0-6 Units Subcutaneous TID WC  . sodium chloride flush  3 mL Intravenous Q12H   sodium chloride, acetaminophen, ondansetron (ZOFRAN) IV, sodium chloride flush  Assessment/ Plan:  Amber Dixon is a 69 y.o. Hispanic female with end stage renal  disease on hemodialysis, diabetes mellitus, hypertension who was admitted today, 09/07/2019 , for ESRD (end stage renal disease) ([place]) [N18.6] Acute respiratory failure with hypoxia ([place]) [J96.01] Acute on chronic diastolic CHF (congestive heart failure) ([place]) [I50.33]   [facility] TTS [facility] RIJ permcath  1. End Stage renal disease: emergent dialysis yesterday.  - Extra dialysis treatment for today due to continued volume overload, oxygen requirements and hypertension. Orders prepared - Use CVC  2. Hypertension: elevated 174/77. Current regimen of furosemide and amlodipine  3. Acute respiratory failure required noninvasive mechanical ventilation.  Secondary to pulmonary edema and pneumonia.  - Emergent dialysis on admission - Empiric antibiotics: Augmentin - Extra dialysis treatment today due to continued volume overload  4. Anemia of chronic kidney disease: hemoglobin 8.9. EPO on her scheduled dialysis days. TTS    LOS: 0 Almina Schul 5/17/20219:12 AM

## 2019-09-08 NOTE — Progress Notes (Signed)
CBG 69 mg/dL, pt given 15G carbs via orange juice. Will reassess.

## 2019-09-08 NOTE — Progress Notes (Signed)
Via Spanish interpreter Antony Haste [phone] updated pt she is transferring to [facility] room 201.  All questions answered and pt requested I update her daughter, Lenna Sciara.  Request fulfilled.  Report called to Annie Main and pt transferred via w/c w/her belongings to room 201.

## 2019-09-08 NOTE — Progress Notes (Signed)
Patient recently set up at [facility] last admission. Patient does have a chair time which is TTS 12:15, however clinic does not like to start unfamiliar patients on a weekend due to being short staffed on the weekends. Please contact me when this patient will discharge this admission so that there will not be any confusion about clinic start date.  Elvera Bicker Dialysis Coordinator [phone]

## 2019-09-08 NOTE — TOC Initial Note (Signed)
Transition of Care [facility]) - Initial/Assessment Note    Patient Details  Name: Amber Dixon MRN: [id number] Date of Birth: February 17, 1951  Transition of Care [facility]) CM/SW Contact:    Shelbie Ammons, RN Phone Number: 09/08/2019, 11:58 AM  Clinical Narrative:    RNCM contacted patient's DIL Melissa to discuss case. Patient was just discharged on Friday from the hospital but began having breathing problems through the night. They took her to dialysis on Saturday but the center told her they were unable to dialyze patient due to orders being for them to start on Monday. DIL reports at that point patient was having so much trouble they decided to bring her back. DIL reports that aside from this nothing has changed with living arrangements and she hasn't been contacted by [facility] agency yet.                 Expected Discharge Plan: [place] Barriers to Discharge: Continued Medical Work up   Patient Goals and CMS Choice        Expected Discharge Plan and Services Expected Discharge Plan: [place]       Living arrangements for the past 2 months: Single Family Home Expected Discharge Date: 09/10/19                           [facility] Agency: [place]        Prior Living Arrangements/Services Living arrangements for the past 2 months: Single Family Home Lives with:: Adult Children, Relatives Patient language and need for interpreter reviewed:: Yes        Need for Family Participation in Patient Care: Yes (Comment) Care giver support system in place?: Yes (comment)   Criminal Activity/Legal Involvement Pertinent to Current Situation/Hospitalization: No - Comment as needed  Activities of Daily Living Home Assistive Devices/Equipment: Walker (specify type) ADL Screening (condition at time of admission) Patient's cognitive ability adequate to safely complete daily activities?: Yes Is the patient deaf or have difficulty  hearing?: No Does the patient have difficulty seeing, even when wearing glasses/contacts?: No Does the patient have difficulty concentrating, remembering, or making decisions?: Yes Patient able to express need for assistance with ADLs?: Yes Does the patient have difficulty dressing or bathing?: Yes Independently performs ADLs?: No Communication: Independent Dressing (OT): Needs assistance Is this a change from baseline?: Pre-admission baseline Grooming: Needs assistance Is this a change from baseline?: Pre-admission baseline Feeding: Needs assistance Is this a change from baseline?: Pre-admission baseline Bathing: Needs assistance Is this a change from baseline?: Pre-admission baseline Toileting: Needs assistance Is this a change from baseline?: Pre-admission baseline In/Out Bed: Needs assistance Is this a change from baseline?: Pre-admission baseline Walks in Home: Needs assistance Is this a change from baseline?: Pre-admission baseline Does the patient have difficulty walking or climbing stairs?: Yes Weakness of Legs: Both Weakness of Arms/Hands: Right  Permission Sought/Granted                  Emotional Assessment         Alcohol / Substance Use: Not Applicable Psych Involvement: No (comment)  Admission diagnosis:  ESRD (end stage renal disease) ([place]) [N18.6] Acute respiratory failure with hypoxia ([place]) [J96.01] Acute on chronic diastolic CHF (congestive heart failure) ([place]) [I50.33] Patient Active Problem List   Diagnosis Date Noted  . Acute on chronic diastolic CHF (congestive heart failure) ([place]) 09/07/2019  . Protein-calorie malnutrition, severe 08/22/2019  .  Altered mental status   . DNR (do not resuscitate) discussion   . Palliative care by specialist   . Encephalopathy 08/16/2019  . ESRD (end stage renal disease) ([place])   . Fluid overload 06/09/2019  . Chest pain 06/09/2019  . Elevated troponin 06/09/2019  . Diabetes mellitus type 2 in nonobese ([place])  06/09/2019  . Anemia of chronic disease 06/09/2019  . Nausea and vomiting 06/09/2019  . Pulmonary edema 06/09/2019  . ESRD (end stage renal disease) on dialysis ([place]) 04/10/2019  . Acute respiratory failure with hypoxemia ([place]) 03/31/2019  . Acute on chronic diastolic (congestive) heart failure ([place]) 03/24/2019  . Hypertension 03/22/2019  . Anemia of renal disease 03/22/2019  . Proteinuria 03/22/2019  . Chest pain 03/22/2019  . Acute on chronic renal failure ([place]) 03/21/2019   PCP:  Patient, No Pcp Per Pharmacy:   [facility] [place], [address] [place] [address] Phone: [phone] Fax: [phone]  [facility] [place], [place] - [address] [place] [address] Phone: [phone] Fax: [phone]  [facility] [place], [place] [address] Phone: [phone] Fax: [phone]  [id number] [place], [place] - [address] [place] [address] Phone: [phone] Fax: [phone]     Social Determinants of Health (Industry) Interventions    Readmission Risk Interventions Readmission Risk Prevention Plan 09/05/2019 08/18/2019  Transportation Screening Complete Complete  Medication Review ([place]) Complete Complete  PCP or Specialist appointment within 3-5 days of discharge - Complete  HRI or Home Care Consult Complete Complete  Palliative Care Screening Complete -  [place] Not Applicable Complete

## 2019-09-08 NOTE — Progress Notes (Signed)
PROGRESS NOTE    Nyhla Dixon  IOX:[id number] DOB: Jan 06, 1951 DOA: 09/07/2019 PCP: Patient, No Pcp Per    Assessment & Plan:   Principal Problem:   Acute on chronic diastolic (congestive) heart failure (HCC) Active Problems:   Hypertension   Anemia of renal disease   Acute respiratory failure with hypoxemia (HCC)   ESRD (end stage renal disease) on dialysis ([place])   Diabetes mellitus type 2 in nonobese ([place])   Protein-calorie malnutrition, severe   Acute on chronic diastolic CHF (congestive heart failure) ([place])   Acute respiratory failure with hypoxia ([place])    Amber Dixon is a 69 y.o. female with medical history significant for end-stage renal disease on hemodialysis (dialysis days T/TH/S), diabetes mellitus with complications of end-stage renal disease, recent hospitalization for metabolic encephalopathy and severe protein calorie malnutrition.  Patient was recently discharged from the hospital after dialysis on Thursday following treatment for aspiration pneumonia.  She had gone to dialysis on 05/15 and was unable to get dialyzed.  She presents to the emergency room via EMS for evaluation of shortness of breath and was found to have increased work of breathing and room air pulse oximetry of 81%. She also complained of left-sided chest pain.  She was placed on BiPAP to reduce work of breathing. She denies having any nausea, vomiting, palpitations or diaphoresis. Chest x-ray showed findings consistent with congestive heart failure/fluid overload with pulmonary edema and moderate bilateral effusions.  Acute respiratory failure with hypoxemia Secondary to chronic diastolic dysfunction CHF and missed dialysis --Chest x ray shows findings consistent with congestive heart failure/fluid overload with pulmonary edema and moderate bilateral effusions. --was on BiPAP on presentation, weaned to 2L today PLAN: --extra dialysis to remove fluid --wean off oxygen  as tolerated  End-stage renal disease Dialysis days are T/TH/S --Need to ensure pt will be received for tx at the dialysis center prior to discharge.  Diabetes mellitus with complications of chronic kidney disease Place patient on consistent carbohydrate diet Sliding scale coverage for glycemic control  Severe protein calorie malnutrition (BMI 16.12) Continue nutritional supplement Dietitian consult  Patient was recently hospitalized for aspiration pneumonia and was discharged home on Augmentin.  She does have an elevated procalcitonin level but has a normal white count and is afebrile.  Will complete oral antibiotic therapy during this hospitalization   DVT prophylaxis: Heparin SQ Code Status: Full code  Family Communication:  Status is: changed to inpatient today Dispo:   The patient is from: home Anticipated d/c is to: home Anticipated d/c date is: tomorrow Patient currently is not medically stable to d/c due to: need extra dialysis to remove fluids and wean down to RA.   Subjective and Interval History:  Since admission, pt has been weaned off BiPAP and now on 2L [place].  Received dialysis yesterday.  No noted fever, N/V/D.    Request another dialysis session today.   Objective: Vitals:   09/08/19 1500 09/08/19 1600 09/08/19 1700 09/08/19 1800  BP: (!) 169/110 (!) 186/80 (!) 183/81 (!) 178/73  Pulse: 97 97 96 95  Resp: (!) 25 (!) 26 16 (!) 21  Temp:  98.1 F (36.7 C)    TempSrc:  Oral    SpO2: 94% 95% 97% 94%  Weight:      Height:        Intake/Output Summary (Last 24 hours) at 09/08/2019 1952 Last data filed at 09/08/2019 1858 Gross per 24 hour  Intake 240 ml  Output 2000 ml  Net -1760 ml   Filed Weights   09/07/19 0917 09/08/19 0130 09/08/19 1125  Weight: 36.2 kg 35.6 kg 36.5 kg    Examination:   Constitutional: NAD, alert HEENT: conjunctivae and lids normal, EOMI CV: RRR no M,R,G. Distal pulses +2.  No cyanosis.   RESP: CTA B/L, normal respiratory  effort, on 2L GI: +BS, NTND Extremities: No effusions, edema, or tenderness in BLE SKIN: warm, dry and intact Neuro: II - XII grossly intact.     Data Reviewed: I have personally reviewed following labs and imaging studies  CBC: Recent Labs  Lab 09/02/19 0520 09/03/19 0600 09/04/19 0519 09/05/19 0414 09/07/19 0926  WBC 12.7* 12.1* 12.2* 10.8* 8.6  NEUTROABS 12.3* 11.8*  --   --  7.2  HGB 9.5* 8.5* 8.0* 9.1* 8.9*  HCT 27.7* 24.5* 24.2* 27.2* 26.9*  MCV 82.2 81.7 84.0 83.7 83.3  PLT 260 254 275 325 948   Basic Metabolic Panel: Recent Labs  Lab 09/03/19 0600 09/04/19 0519 09/05/19 0414 09/07/19 0926 09/08/19 0505  NA 130* 131* 137 131* 135  K 4.7 5.6* 3.7 4.5 4.3  CL 90* 90* 94* 88* 93*  CO2 27 27 30 25 29   GLUCOSE 258* 138* 136* 116* 73  BUN 48* 72* 32* 57* 25*  CREATININE 3.67* 4.51* 2.26* 4.50* 2.83*  CALCIUM 7.8* 7.5* 7.3* 8.0* 7.7*  PHOS  --  6.7*  --   --   --    GFR: Estimated Creatinine Clearance: 10.7 mL/min (A) (by C-G formula based on SCr of 2.83 mg/dL (H)). Liver Function Tests: Recent Labs  Lab 09/04/19 0519 09/07/19 0926  AST  --  20  ALT  --  26  ALKPHOS  --  95  BILITOT  --  0.9  PROT  --  6.9  ALBUMIN 2.7* 3.0*   No results for input(s): LIPASE, AMYLASE in the last 168 hours. No results for input(s): AMMONIA in the last 168 hours. Coagulation Profile: No results for input(s): INR, PROTIME in the last 168 hours. Cardiac Enzymes: No results for input(s): CKTOTAL, CKMB, CKMBINDEX, TROPONINI in the last 168 hours. BNP (last 3 results) No results for input(s): PROBNP in the last 8760 hours. HbA1C: No results for input(s): HGBA1C in the last 72 hours. CBG: Recent Labs  Lab 09/07/19 2033 09/07/19 2228 09/08/19 0727 09/08/19 1130 09/08/19 1627  GLUCAP 119* 116* 69* 120* 97   Lipid Profile: No results for input(s): CHOL, HDL, LDLCALC, TRIG, CHOLHDL, LDLDIRECT in the last 72 hours. Thyroid Function Tests: No results for input(s): TSH,  T4TOTAL, FREET4, T3FREE, THYROIDAB in the last 72 hours. Anemia Panel: No results for input(s): VITAMINB12, FOLATE, FERRITIN, TIBC, IRON, RETICCTPCT in the last 72 hours. Sepsis Labs: Recent Labs  Lab 09/07/19 0926 09/08/19 0505  PROCALCITON 3.11 2.29  LATICACIDVEN 0.7  --     Recent Results (from the past 240 hour(s))  CULTURE, BLOOD (ROUTINE X 2) w Reflex to ID Panel     Status: None   Collection Time: 09/01/19  5:01 AM   Specimen: BLOOD  Result Value Ref Range Status   Specimen Description BLOOD LEFT [facility]  Final   Special Requests   Final    BOTTLES DRAWN AEROBIC AND ANAEROBIC Blood Culture adequate volume   Culture   Final    NO GROWTH 5 DAYS Performed at [facility], [address] [place], [place] [address]    Report Status 09/06/2019 FINAL  Final  CULTURE, BLOOD (ROUTINE X 2) w Reflex to  ID Panel     Status: None   Collection Time: 09/01/19  5:01 AM   Specimen: BLOOD  Result Value Ref Range Status   Specimen Description BLOOD LEFT [facility]AN  Final   Special Requests   Final    BOTTLES DRAWN AEROBIC AND ANAEROBIC Blood Culture adequate volume   Culture   Final    NO GROWTH 5 DAYS Performed at [facility], [address] [place], [place] [address]    Report Status 09/06/2019 FINAL  Final  MRSA PCR Screening     Status: None   Collection Time: 09/01/19  1:46 PM   Specimen: Nasal Mucosa; Nasopharyngeal  Result Value Ref Range Status   MRSA by PCR NEGATIVE NEGATIVE Final    Comment:        The GeneXpert MRSA Assay (FDA approved for NASAL specimens only), is one component of a comprehensive MRSA colonization surveillance program. It is not intended to diagnose MRSA infection nor to guide or monitor treatment for MRSA infections. Performed at [facility], [address] [place], [place] [address]   SARS Coronavirus 2 by RT PCR (hospital order, performed in [facility] hospital lab) Nasopharyngeal Nasopharyngeal Swab     Status: None    Collection Time: 09/07/19  9:26 AM   Specimen: Nasopharyngeal Swab  Result Value Ref Range Status   SARS Coronavirus 2 NEGATIVE NEGATIVE Final    Comment: (NOTE) SARS-CoV-2 target nucleic acids are NOT DETECTED. The SARS-CoV-2 RNA is generally detectable in upper and lower respiratory specimens during the acute phase of infection. The lowest concentration of SARS-CoV-2 viral copies this assay can detect is 250 copies / mL. A negative result does not preclude SARS-CoV-2 infection and should not be used as the sole basis for treatment or other patient management decisions.  A negative result may occur with improper specimen collection / handling, submission of specimen other than nasopharyngeal swab, presence of viral mutation(s) within the areas targeted by this assay, and inadequate number of viral copies (<250 copies / mL). A negative result must be combined with clinical observations, patient history, and epidemiological information. Fact Sheet for Patients:   StrictlyIdeas.no Fact Sheet for Healthcare Providers: BankingDealers.co.za This test is not yet approved or cleared  by the [place] FDA and has been authorized for detection and/or diagnosis of SARS-CoV-2 by FDA under an Emergency Use Authorization (EUA).  This EUA will remain in effect (meaning this test can be used) for the duration of the COVID-19 declaration under Section 564(b)(1) of the Act, 21 U.S.C. section 360bbb-3(b)(1), unless the authorization is terminated or revoked sooner. Performed at [facility], [address], [place], [place] [address]       Radiology Studies: DG Chest Portable 1 View  Result Date: 09/07/2019 CLINICAL DATA:  Pt missed her dialysis yesterday, was discharged from the hospital Friday, daughter reports she has been labored to breathe since Friday, pt is breathing 48 times a minute with a room air sat of 81%, pt also c/o left  sided chest pain. PT placed on BIPAP. Pt had been admitted to [facility] for sepsis but discharged Friday. Hx - DM, ESRD, HTN, AV fistula placement 04/24/2019, non-smoker. COVID test pending but was negative 08/16/2019 upon last admission. EXAM: PORTABLE CHEST 1 VIEW COMPARISON:  09/01/2019 and earlier exams. FINDINGS: Bilateral airspace opacities, that are most confluent in the lower lungs. Hemidiaphragms are obscured consistent with moderate bilateral effusions. Cardiac silhouette is enlarged. No pneumothorax. Right internal jugular tunneled dual lumen central  venous catheter is stable. IMPRESSION: 1. Findings consistent with congestive heart failure/fluid overload with pulmonary edema and moderate bilateral effusions. Electronically Signed   By: Lajean Manes M.D.   On: 09/07/2019 09:59     Scheduled Meds: . amLODipine  5 mg Oral Daily  . amoxicillin-clavulanate  1 tablet Oral BID  . carvedilol  6.25 mg Oral BID WC  . Chlorhexidine Gluconate Cloth  6 each Topical Daily  . feeding supplement (GLUCERNA SHAKE)  237 mL Oral TID BM  . furosemide  80 mg Oral QODAY  . heparin  5,000 Units Subcutaneous Q8H  . insulin aspart  0-6 Units Subcutaneous TID WC  . sodium chloride flush  3 mL Intravenous Q12H   Continuous Infusions: . sodium chloride       LOS: 0 days     Enzo Bi, MD [facility] If 7PM-7AM, please contact night-coverage 09/08/2019, 7:52 PM

## 2019-09-09 LAB — MAGNESIUM: Magnesium: 2.2 mg/dL (ref 1.7–2.4)

## 2019-09-09 LAB — BASIC METABOLIC PANEL
Anion gap: 10 (ref 5–15)
BUN: 16 mg/dL (ref 8–23)
CO2: 30 mmol/L (ref 22–32)
Calcium: 7.8 mg/dL — ABNORMAL LOW (ref 8.9–10.3)
Chloride: 96 mmol/L — ABNORMAL LOW (ref 98–111)
Creatinine, Ser: 2.22 mg/dL — ABNORMAL HIGH (ref 0.44–1.00)
GFR calc Af Amer: 25 mL/min — ABNORMAL LOW (ref >60)
GFR calc non Af Amer: 22 mL/min — ABNORMAL LOW (ref >60)
Glucose, Bld: 98 mg/dL (ref 70–99)
Potassium: 4.3 mmol/L (ref 3.5–5.1)
Sodium: 136 mmol/L (ref 135–145)

## 2019-09-09 LAB — CBC
HCT: 25.1 % — ABNORMAL LOW (ref 36.0–46.0)
Hemoglobin: 8 g/dL — ABNORMAL LOW (ref 12.0–15.0)
MCH: 27.4 pg (ref 26.0–34.0)
MCHC: 31.9 g/dL (ref 30.0–36.0)
MCV: 86 fL (ref 80.0–100.0)
Platelets: 350 10*3/uL (ref 150–400)
RBC: 2.92 MIL/uL — ABNORMAL LOW (ref 3.87–5.11)
RDW: 16.7 % — ABNORMAL HIGH (ref 11.5–15.5)
WBC: 5.3 10*3/uL (ref 4.0–10.5)
nRBC: 0 % (ref 0.0–0.2)

## 2019-09-09 LAB — GLUCOSE, CAPILLARY
Glucose-Capillary: 81 mg/dL (ref 70–99)
Glucose-Capillary: 100 mg/dL — ABNORMAL HIGH (ref 70–99)

## 2019-09-09 LAB — PROCALCITONIN: Procalcitonin: 1.82 ng/mL

## 2019-09-09 MED ORDER — GLUCERNA SHAKE PO LIQD: 237.0000 mL | Freq: Three times a day (TID) | ORAL | 0 refills | Status: AC

## 2019-09-09 MED ORDER — CARVEDILOL 6.25 MG PO TABS: 6.2500 mg | ORAL_TABLET | Freq: Two times a day (BID) | ORAL | 2 refills | Status: AC

## 2019-09-09 NOTE — Discharge Summary (Signed)
Physician Discharge Summary   Amber Dixon  female DOB: [birth year]  TMH:[id number]  PCP: Patient, No Pcp Per  Admit date: 09/07/2019 Discharge date: 09/09/2019  Admitted From: home Disposition:  Home Daughter updated on the phone prior to discharge.  Home Health: Yes CODE STATUS: Full code   Hospital Course:  For full details, please see H&P, progress notes, consult notes and ancillary notes.  Briefly,  Amber Dixon a 69 y.o.Hispanic femalewith medical history significant forend-stage renal disease on hemodialysis(T/TH/S), diabetes mellitus with complications of end-stage renal disease, recent hospitalization for metabolic encephalopathy and aspiration pneumonia.   Pt had gone to dialysis on 05/15(Saturday) and was not received for dialysis due to dialysis center not wanting to start a new pt on a weekend day.Pt presented to the emergency room via EMS for evaluation of shortness of breath and was found to have increased work of breathing and room air pulse oximetry of 81%. Pt was placed on BiPAP to reduce work of breathing and admitted to stepdown.  Chest x-ray showedfindings consistent with congestive heart failure/fluid overload with pulmonary edema and moderate bilateral effusions.  Acute respiratory failure with hypoxemia Secondary missed dialysis (no fault of her own) Pt received urgent dialysis for fluid removal, and was able to be weaned down from BiPAP to 2L the next day.  Pt received inpatient dialysis daily for 3 days prior to discharge.  On the day of discharge, pt was sating well on RA at rest, however, dropped O2 sat with ambulation, so pt was discharged with 2L O2 for home use.    End-stage renal disease Dialysis days are T/TH/S Pt received inpatient dialysis daily for 3 days prior to discharge.  Pt will present to outpatient dialysis center on 5/20 (Thursday).  Diabetes mellituswith complications of chronic kidney  disease Pt discharged on home diabetic regimen.  Severe protein calorie malnutrition(BMI 16.12) Continued nutritional supplement  Hypertension BP elevated.  Per nephrology, pt is to continue home regimen of coreg, furosemide and amlodipine for now, and BP will be monitored during dialysis, and BP meds further adjusted as outpatient.  Anemia of chronic kidney disease EPO per nephrology.  Hx of aspiration pneumonia, treated Pt was discharged home on Augmentin which was continued during this admission and finished the course prior to discharge.   Discharge Diagnoses:  Principal Problem:   Acute on chronic diastolic (congestive) heart failure (HCC) Active Problems:   Hypertension   Anemia of renal disease   Acute respiratory failure with hypoxemia (HCC)   ESRD (end stage renal disease) on dialysis (HCC)   Diabetes mellitus type 2 in nonobese (HCC)   Protein-calorie malnutrition, severe   Acute on chronic diastolic CHF (congestive heart failure) ([place])   Acute respiratory failure with hypoxia [facility])    Discharge Instructions:  Allergies as of 09/09/2019   No Known Allergies     Medication List    STOP taking these medications   amoxicillin-clavulanate 875-125 MG tablet Commonly known as: Augmentin     TAKE these medications   acetaminophen 325 MG tablet Commonly known as: TYLENOL Take 650 mg by mouth every 6 (six) hours as needed.   amLODipine 5 MG tablet Commonly known as: NORVASC Take 1 tablet (5 mg total) by mouth daily.   carvedilol 6.25 MG tablet Commonly known as: COREG Take 1 tablet (6.25 mg total) by mouth 2 (two) times daily with a meal.   feeding supplement (GLUCERNA SHAKE) Liqd Take 237 mLs by mouth 3 (three) times  daily between meals.   furosemide 80 MG tablet Commonly known as: LASIX Take 1 tablet (80 mg total) by mouth every other day.   INSULIN NPH ISOPHANE & REGULAR [place] Inject 5 Units into the skin every evening.         No Known  Allergies   The results of significant diagnostics from this hospitalization (including imaging, microbiology, ancillary and laboratory) are listed below for reference.   Consultations:   Procedures/Studies: EEG  Result Date: 08/20/2019 Alexis Goodell, MD     08/20/2019  4:59 PM ELECTROENCEPHALOGRAM REPORT Patient: Amber Dixon       Room #: FI43P-IR EEG No. ID: [id number] Age: 69 y.o.        Sex: female Requesting Physician: Mal Misty Report Date:  08/20/2019       Interpreting Physician: Alexis Goodell History: Amber Dixon is an 69 y.o. female with altered mental status Medications: Norvasc, Rocephin, Catapres, Lasix, Insulin Conditions of Recording:  This is a 21 channel routine scalp EEG performed with bipolar and monopolar montages arranged in accordance to the international 10/20 system of electrode placement. One channel was dedicated to EKG recording. The patient is in the poorly responsive state. Description:  The background activity is slow and poorly organized diffusely with frequent, high voltage, generalized, intermittent periodic discharges of triphasic morphology.  These are continuous throughout the recording and occur at a frequency of approximately 2-3 Hz.  There is no change in the background rhythm with stimulation.  Hyperventilation and intermittent photic stimulation were not performed. IMPRESSION: This is a markedly abnormal electroencephalogram due to general background slowing and frequent, generalized triphasic waves.  This finding is consistent with a severe encephalopathic state, etiology nonspecific. Alexis Goodell, MD Neurology [phone] 08/20/2019, 4:02 PM   DG Chest 1 View  Result Date: 08/31/2019 CLINICAL DATA:  69 year old female with chest and abdominal pain. EXAM: CHEST  1 VIEW; ABDOMEN - 1 VIEW COMPARISON:  Radiograph dated 08/19/2019. FINDINGS: Right-sided dialysis catheter with tip over right atrium. Bilateral confluent airspace  opacities, new since the prior radiograph may represent edema but concerning for pneumonia. Clinical correlation recommended. No pleural effusion pneumothorax. Stable cardiomegaly. There is no bowel dilatation or evidence of obstruction. No free air or radiopaque calculi. Osteopenia. No acute osseous pathology. Partially visualized right femoral fixation hardware. IMPRESSION: 1. Bilateral confluent airspace opacities may represent edema but concerning for pneumonia. Clinical correlation is recommended. 2. No bowel obstruction. Electronically Signed   By: Anner Crete M.D.   On: 08/31/2019 23:31   DG Abd 1 View  Result Date: 09/04/2019 CLINICAL DATA:  Abdominal distension. EXAM: ABDOMEN - 1 VIEW COMPARISON:  Sep 01, 2019. FINDINGS: The bowel gas pattern is normal. No radio-opaque calculi or other significant radiographic abnormality are seen. IMPRESSION: Negative. Electronically Signed   By: Marijo Conception M.D.   On: 09/04/2019 18:43   DG Abd 1 View  Result Date: 08/31/2019 CLINICAL DATA:  69 year old female with chest and abdominal pain. EXAM: CHEST  1 VIEW; ABDOMEN - 1 VIEW COMPARISON:  Radiograph dated 08/19/2019. FINDINGS: Right-sided dialysis catheter with tip over right atrium. Bilateral confluent airspace opacities, new since the prior radiograph may represent edema but concerning for pneumonia. Clinical correlation recommended. No pleural effusion pneumothorax. Stable cardiomegaly. There is no bowel dilatation or evidence of obstruction. No free air or radiopaque calculi. Osteopenia. No acute osseous pathology. Partially visualized right femoral fixation hardware. IMPRESSION: 1. Bilateral confluent airspace opacities may represent edema but concerning for pneumonia. Clinical  correlation is recommended. 2. No bowel obstruction. Electronically Signed   By: Anner Crete M.D.   On: 08/31/2019 23:31   CT Head Wo Contrast  Result Date: 08/16/2019 CLINICAL DATA:  Sepsis, nausea, vomiting,  diarrhea, dialysis dependent EXAM: CT HEAD WITHOUT CONTRAST TECHNIQUE: Contiguous axial images were obtained from the base of the skull through the vertex without intravenous contrast. COMPARISON:  01/15/2018 FINDINGS: Brain: No acute infarct or hemorrhage. Chronic small vessel ischemic changes are seen within the bilateral basal ganglia and periventricular white matter. The lateral ventricles and remaining midline structures are unremarkable. No acute extra-axial fluid collections. No mass effect. Vascular: No hyperdense vessel or unexpected calcification. Skull: Normal. Negative for fracture or focal lesion. Sinuses/Orbits: Minimal secretions within the right sphenoid sinus. Remaining sinuses are clear. Other: None. IMPRESSION: 1. Stable chronic small vessel ischemic changes.  No acute process. Electronically Signed   By: Randa Ngo M.D.   On: 08/16/2019 21:53   MR BRAIN WO CONTRAST  Result Date: 08/19/2019 CLINICAL DATA:  In stage renal disease, diabetes and hypertension. Altered mental status. EXAM: MRI HEAD WITHOUT CONTRAST TECHNIQUE: Multiplanar, multiecho pulse sequences of the brain and surrounding structures were obtained without intravenous contrast. COMPARISON:  Head CT 3 days ago FINDINGS: Brain: Diffusion imaging does not show any acute or subacute infarction. No focal abnormality affects the brainstem or cerebellum. Cerebral hemispheres show old small vessel infarction in the left basal ganglia and mild to moderate chronic small-vessel ischemic changes of the cerebral hemispheric white matter. There is an old right parietal cortical and subcortical infarction. No mass lesion, hemorrhage, hydrocephalus or extra-axial collection. Vascular: Major vessels at the base of the brain show flow. Skull and upper cervical spine: Negative Sinuses/Orbits: Mild mucosal inflammatory changes of the sinuses. Bilateral mastoid effusions. Orbits negative. Other: None IMPRESSION: No acute finding. Old left basal  ganglia infarction. 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 Moderate chronic small-vessel ischemic changes of the hemispheric white matter. Electronically Signed   By: Nelson Chimes M.D.   On: 08/19/2019 17:08   DG Chest Portable 1 View  Result Date: 09/07/2019 CLINICAL DATA:  Pt missed her dialysis yesterday, was discharged from the hospital Friday, daughter reports she has been labored to breathe since Friday, pt is breathing 48 times a minute with a room air sat of 81%, pt also c/o left sided chest pain. PT placed on BIPAP. Pt had been admitted to [facility] for sepsis but discharged Friday. Hx - DM, ESRD, HTN, AV fistula placement 04/24/2019, non-smoker. COVID test pending but was negative 08/16/2019 upon last admission. EXAM: PORTABLE CHEST 1 VIEW COMPARISON:  09/01/2019 and earlier exams. FINDINGS: Bilateral airspace opacities, that are most confluent in the lower lungs. Hemidiaphragms are obscured consistent with moderate bilateral effusions. Cardiac silhouette is enlarged. No pneumothorax. Right internal jugular tunneled dual lumen central venous catheter is stable. IMPRESSION: 1. Findings consistent with congestive heart failure/fluid overload with pulmonary edema and moderate bilateral effusions. Electronically Signed   By: Lajean Manes M.D.   On: 09/07/2019 09:59   DG Chest Port 1 View  Result Date: 09/01/2019 CLINICAL DATA:  Hypoxia EXAM: PORTABLE CHEST 1 VIEW COMPARISON:  08/31/2019, 08/29/2019 FINDINGS: Right-sided central venous catheter tip over the right atrium. Cardiomegaly with extensive bilateral airspace opacities progressed compared to prior radiograph. Increased right pleural effusion. No pneumothorax. IMPRESSION: Cardiomegaly with interval progression of extensive bilateral airspace opacities, edema versus diffuse pneumonia. Increased right-sided pleural effusion. Electronically Signed   By: Donavan Foil M.D.   On:  09/01/2019 21:23   DG Chest Port 1 View  Result Date:  08/29/2019 CLINICAL DATA:  Fever. EXAM: PORTABLE CHEST 1 VIEW COMPARISON:  08/16/2019. FINDINGS: Right-sided dual-lumen catheter in unchanged position. Stable cardiomegaly. Mild bilateral interstitial prominence again noted. Component these changes may be chronic however an active interstitial process including pneumonitis and or interstitial edema cannot be excluded. No pleural effusion or pneumothorax. IMPRESSION: 1.  Right-sided dual-lumen catheter in unchanged position. 2. Stable cardiomegaly. 3 mild bilateral interstitial prominence again noted. A component these changes may be chronic however an active interstitial process including pneumonitis and or interstitial edema cannot be excluded. Similar finding noted on prior exam. Electronically Signed   By: [place]   On: 08/29/2019 09:58   DG Chest Port 1 View  Result Date: 08/16/2019 CLINICAL DATA:  Fever. EXAM: PORTABLE CHEST 1 VIEW COMPARISON:  August 14, 2019 FINDINGS: There is stable right-sided venous catheter positioning. Mild, chronic appearing increased lung markings are seen without evidence of acute infiltrate, pleural effusion or pneumothorax. There is stable moderate to marked severity enlargement of the cardiac silhouette. The visualized skeletal structures are unremarkable. IMPRESSION: Stable cardiomegaly without evidence of acute or active cardiopulmonary disease. Electronically Signed   By: Virgina Norfolk M.D.   On: 08/16/2019 20:50   DG Chest Port 1 View  Result Date: 08/14/2019 CLINICAL DATA:  Altered mental status. EXAM: PORTABLE CHEST 1 VIEW COMPARISON:  Radiograph 04/05/2019 FINDINGS: Right-sided dialysis catheter tip in the atrial caval junction. Unchanged cardiomegaly. Unchanged mediastinal contours. Improved heterogeneous opacities from prior exam, likely improving pulmonary edema. Mild residual peribronchial thickening. Suspected small pleural effusions. No pneumothorax. No acute osseous abnormalities are seen.  IMPRESSION: 1. Improved heterogeneous opacities from prior exam, likely improving pulmonary edema. Mild peribronchial thickening may be persistent or recurrent edema or bronchitic inflammation. 2. Suspected small pleural effusions. 3. Unchanged cardiomegaly. Electronically Signed   By: Keith Rake M.D.   On: 08/14/2019 19:39   DG Abd Portable 1V  Result Date: 09/01/2019 CLINICAL DATA:  Abdominal pain, possible constipation EXAM: PORTABLE ABDOMEN - 1 VIEW COMPARISON:  08/31/2019 FINDINGS: Scattered large and small bowel gas is noted similar to that seen on the previous day. No obstructive changes are noted. No findings of constipation are seen. No free air is noted. Stable changes in the lung bases are again seen. No acute bony abnormality is noted. Postsurgical changes in the right hip are seen. IMPRESSION: No acute abnormality in the abdomen. The overall appearance is similar to that seen on the previous day. Electronically Signed   By: Inez Catalina M.D.   On: 09/01/2019 18:37   DG Abd Portable 1V  Result Date: 08/19/2019 CLINICAL DATA:  NG tube placement. EXAM: PORTABLE ABDOMEN - 1 VIEW COMPARISON:  None. FINDINGS: Tip and side port of the enteric tube below the diaphragm in the stomach. Right-sided dialysis catheter is partially included. Heart is enlarged. Normal bowel gas pattern in the upper abdomen. IMPRESSION: Tip and side port of the enteric tube below the diaphragm in the stomach. Electronically Signed   By: Keith Rake M.D.   On: 08/19/2019 18:45      Labs: BNP (last 3 results) Recent Labs    08/17/19 0656 08/19/19 0406 09/07/19 0926  BNP 3,273.0* 2,752.0* 1,751.0*   Basic Metabolic Panel: Recent Labs  Lab 09/04/19 0519 09/05/19 0414 09/07/19 0926 09/08/19 0505 09/09/19 0410  NA 131* 137 131* 135 136  K 5.6* 3.7 4.5 4.3 4.3  CL 90* 94* 88* 93* 96*  CO2 27 30 25 29 30   GLUCOSE 138* 136* 116* 73 98  BUN 72* 32* 57* 25* 16  CREATININE 4.51* 2.26* 4.50* 2.83*  2.22*  CALCIUM 7.5* 7.3* 8.0* 7.7* 7.8*  MG  --   --   --   --  2.2  PHOS 6.7*  --   --   --   --    Liver Function Tests: Recent Labs  Lab 09/04/19 0519 09/07/19 0926  AST  --  20  ALT  --  26  ALKPHOS  --  95  BILITOT  --  0.9  PROT  --  6.9  ALBUMIN 2.7* 3.0*   No results for input(s): LIPASE, AMYLASE in the last 168 hours. No results for input(s): AMMONIA in the last 168 hours. CBC: Recent Labs  Lab 09/03/19 0600 09/04/19 0519 09/05/19 0414 09/07/19 0926 09/09/19 0410  WBC 12.1* 12.2* 10.8* 8.6 5.3  NEUTROABS 11.8*  --   --  7.2  --   HGB 8.5* 8.0* 9.1* 8.9* 8.0*  HCT 24.5* 24.2* 27.2* 26.9* 25.1*  MCV 81.7 84.0 83.7 83.3 86.0  PLT 254 275 325 390 350   Cardiac Enzymes: No results for input(s): CKTOTAL, CKMB, CKMBINDEX, TROPONINI in the last 168 hours. BNP: Invalid input(s): POCBNP CBG: Recent Labs  Lab 09/08/19 0727 09/08/19 1130 09/08/19 1627 09/08/19 2053 09/09/19 0734  GLUCAP 69* 120* 97 107* 81   D-Dimer No results for input(s): DDIMER in the last 72 hours. Hgb A1c No results for input(s): HGBA1C in the last 72 hours. Lipid Profile No results for input(s): CHOL, HDL, LDLCALC, TRIG, CHOLHDL, LDLDIRECT in the last 72 hours. Thyroid function studies No results for input(s): TSH, T4TOTAL, T3FREE, THYROIDAB in the last 72 hours.  Invalid input(s): FREET3 Anemia work up No results for input(s): VITAMINB12, FOLATE, FERRITIN, TIBC, IRON, RETICCTPCT in the last 72 hours. Urinalysis    Component Value Date/Time   COLORURINE YELLOW (A) 09/01/2019 0442   APPEARANCEUR HAZY (A) 09/01/2019 0442   LABSPEC 1.015 09/01/2019 0442   PHURINE 9.0 (H) 09/01/2019 0442   GLUCOSEU >=500 (A) 09/01/2019 0442   HGBUR NEGATIVE 09/01/2019 0442   BILIRUBINUR NEGATIVE 09/01/2019 0442   KETONESUR NEGATIVE 09/01/2019 0442   PROTEINUR >=300 (A) 09/01/2019 0442   NITRITE NEGATIVE 09/01/2019 0442   LEUKOCYTESUR NEGATIVE 09/01/2019 0442   Sepsis Labs Invalid input(s):  PROCALCITONIN,  WBC,  LACTICIDVEN Microbiology Recent Results (from the past 240 hour(s))  CULTURE, BLOOD (ROUTINE X 2) w Reflex to ID Panel     Status: None   Collection Time: 09/01/19  5:01 AM   Specimen: BLOOD  Result Value Ref Range Status   Specimen Description BLOOD LEFT [facility]  Final   Special Requests   Final    BOTTLES DRAWN AEROBIC AND ANAEROBIC Blood Culture adequate volume   Culture   Final    NO GROWTH 5 DAYS Performed at [facility], [address] [place], [place] [address]    Report Status 09/06/2019 FINAL  Final  CULTURE, BLOOD (ROUTINE X 2) w Reflex to ID Panel     Status: None   Collection Time: 09/01/19  5:01 AM   Specimen: BLOOD  Result Value Ref Range Status   Specimen Description BLOOD LEFT HAN  Final   Special Requests   Final    BOTTLES DRAWN AEROBIC AND ANAEROBIC Blood Culture adequate volume   Culture   Final    NO GROWTH 5 DAYS Performed at [facility], [address] [place], [place] [address]    Report Status 09/06/2019 FINAL  Final  MRSA PCR Screening     Status: None   Collection Time: 09/01/19  1:46 PM   Specimen: Nasal Mucosa; Nasopharyngeal  Result Value Ref Range Status   MRSA by PCR NEGATIVE NEGATIVE Final    Comment:        The GeneXpert MRSA Assay (FDA approved for NASAL specimens only), is one component of a comprehensive MRSA colonization surveillance program. It is not intended to diagnose MRSA infection nor to guide or monitor treatment for MRSA infections. Performed at [facility], [address] [place], [place] [address]   SARS Coronavirus 2 by RT PCR (hospital order, performed in [facility] hospital lab) Nasopharyngeal Nasopharyngeal Swab     Status: None   Collection Time: 09/07/19  9:26 AM   Specimen: Nasopharyngeal Swab  Result Value Ref Range Status   SARS Coronavirus 2 NEGATIVE NEGATIVE Final    Comment: (NOTE) SARS-CoV-2 target nucleic acids are NOT DETECTED. The SARS-CoV-2 RNA is  generally detectable in upper and lower respiratory specimens during the acute phase of infection. The lowest concentration of SARS-CoV-2 viral copies this assay can detect is 250 copies / mL. A negative result does not preclude SARS-CoV-2 infection and should not be used as the sole basis for treatment or other patient management decisions.  A negative result may occur with improper specimen collection / handling, submission of specimen other than nasopharyngeal swab, presence of viral mutation(s) within the areas targeted by this assay, and inadequate number of viral copies (<250 copies / mL). A negative result must be combined with clinical observations, patient history, and epidemiological information. Fact Sheet for Patients:   StrictlyIdeas.no Fact Sheet for Healthcare Providers: BankingDealers.co.za This test is not yet approved or cleared  by the [place] FDA and has been authorized for detection and/or diagnosis of SARS-CoV-2 by FDA under an Emergency Use Authorization (EUA).  This EUA will remain in effect (meaning this test can be used) for the duration of the COVID-19 declaration under Section 564(b)(1) of the Act, 21 U.S.C. section 360bbb-3(b)(1), unless the authorization is terminated or revoked sooner. Performed at [facility], [address] [place], [place] [address]      Total time spend on discharging this patient, including the last patient exam, discussing the hospital stay, instructions for ongoing care as it relates to all pertinent caregivers, as well as preparing the medical discharge records, prescriptions, and/or referrals as applicable, is 45 minutes.    Enzo Bi, MD  [facility] 09/09/2019, 12:03 PM  If 7PM-7AM, please contact night-coverage

## 2019-09-09 NOTE — Progress Notes (Signed)
[facility]  ROUNDING NOTE   Subjective:   Patient seen and examined on hemodialysis treatment. Tolerating treatment well. UF goal of 2-3 liters. Patient has had three consecutive days of hemodialysis due to pulmonary edema and shortness of breath.   History taken with assistance of Spanish Interpreter.     HEMODIALYSIS FLOWSHEET:  Blood Flow Rate (mL/min): 400 mL/min Arterial Pressure (mmHg): -210 mmHg Venous Pressure (mmHg): 130 mmHg Transmembrane Pressure (mmHg): 60 mmHg Ultrafiltration Rate (mL/min): 1210 mL/min Dialysate Flow Rate (mL/min): 600 ml/min Conductivity: Machine : 14.2 Conductivity: Machine : 14.2 Dialysis Fluid Bolus: Normal Saline Bolus Amount (mL): 250 mL    Objective:  Vital signs in last 24 hours:  Temp:  [97.8 F (36.6 C)-98.7 F (37.1 C)] 97.8 F (36.6 C) (05/18 1035) Pulse Rate:  [77-98] 80 (05/18 1300) Resp:  [12-48] 14 (05/18 1300) BP: (115-186)/(66-110) 165/77 (05/18 1300) SpO2:  [94 %-100 %] 98 % (05/18 1035) FiO2 (%):  [98 %] 98 % (05/18 1035)  Weight change: 0.303 kg Filed Weights   09/07/19 0917 09/08/19 0130 09/08/19 1125  Weight: 36.2 kg 35.6 kg 36.5 kg    Intake/Output: I/O last 3 completed shifts: In: 240 [P.O.:240] Out: 2000 [Other:2000]   Intake/Output this shift:  No intake/output data recorded.  Physical Exam: General: Critically ill  Head: Normocephalic, atraumatic. Moist oral mucosal membranes  Eyes: Anicteric, PERRL  Neck: Supple, trachea midline  Lungs:  Bilateral crackles   Heart: Regular rate and rhythm  Abdomen:  Soft, nontender,   Extremities: trace peripheral edema.  Neurologic: Nonfocal, moving all four extremities  Skin: No lesions  Access: Right arm AVF + bruit and +thrill RIJ permcath    Basic Metabolic Panel: Recent Labs  Lab 09/04/19 0519 09/04/19 0519 09/05/19 0414 09/05/19 0414 09/07/19 0926 09/08/19 0505 09/09/19 0410  NA 131*  --  137  --  131* 135 136  K 5.6*  --  3.7   --  4.5 4.3 4.3  CL 90*  --  94*  --  88* 93* 96*  CO2 27  --  30  --  25 29 30   GLUCOSE 138*  --  136*  --  116* 73 98  BUN 72*  --  32*  --  57* 25* 16  CREATININE 4.51*  --  2.26*  --  4.50* 2.83* 2.22*  CALCIUM 7.5*   < > 7.3*   < > 8.0* 7.7* 7.8*  MG  --   --   --   --   --   --  2.2  PHOS 6.7*  --   --   --   --   --   --    < > = values in this interval not displayed.    Liver Function Tests: Recent Labs  Lab 09/04/19 0519 09/07/19 0926  AST  --  20  ALT  --  26  ALKPHOS  --  95  BILITOT  --  0.9  PROT  --  6.9  ALBUMIN 2.7* 3.0*   No results for input(s): LIPASE, AMYLASE in the last 168 hours. No results for input(s): AMMONIA in the last 168 hours.  CBC: Recent Labs  Lab 09/03/19 0600 09/04/19 0519 09/05/19 0414 09/07/19 0926 09/09/19 0410  WBC 12.1* 12.2* 10.8* 8.6 5.3  NEUTROABS 11.8*  --   --  7.2  --   HGB 8.5* 8.0* 9.1* 8.9* 8.0*  HCT 24.5* 24.2* 27.2* 26.9* 25.1*  MCV 81.7 84.0 83.7 83.3 86.0  PLT 254 275 325 390 350    Cardiac Enzymes: No results for input(s): CKTOTAL, CKMB, CKMBINDEX, TROPONINI in the last 168 hours.  BNP: Invalid input(s): POCBNP  CBG: Recent Labs  Lab 09/08/19 0727 09/08/19 1130 09/08/19 1627 09/08/19 2053 09/09/19 0734  GLUCAP 69* 120* 97 107* 81    Microbiology: Results for orders placed or performed during the hospital encounter of 09/07/19  SARS Coronavirus 2 by RT PCR (hospital order, performed in [facility] hospital lab) Nasopharyngeal Nasopharyngeal Swab     Status: None   Collection Time: 09/07/19  9:26 AM   Specimen: Nasopharyngeal Swab  Result Value Ref Range Status   SARS Coronavirus 2 NEGATIVE NEGATIVE Final    Comment: (NOTE) SARS-CoV-2 target nucleic acids are NOT DETECTED. The SARS-CoV-2 RNA is generally detectable in upper and lower respiratory specimens during the acute phase of infection. The lowest concentration of SARS-CoV-2 viral copies this assay can detect is 250 copies / mL. A negative  result does not preclude SARS-CoV-2 infection and should not be used as the sole basis for treatment or other patient management decisions.  A negative result may occur with improper specimen collection / handling, submission of specimen other than nasopharyngeal swab, presence of viral mutation(s) within the areas targeted by this assay, and inadequate number of viral copies (<250 copies / mL). A negative result must be combined with clinical observations, patient history, and epidemiological information. Fact Sheet for Patients:   StrictlyIdeas.no Fact Sheet for Healthcare Providers: BankingDealers.co.za This test is not yet approved or cleared  by the [place] FDA and has been authorized for detection and/or diagnosis of SARS-CoV-2 by FDA under an Emergency Use Authorization (EUA).  This EUA will remain in effect (meaning this test can be used) for the duration of the COVID-19 declaration under Section 564(b)(1) of the Act, 21 U.S.C. section 360bbb-3(b)(1), unless the authorization is terminated or revoked sooner. Performed at [facility], [address] [place], [place] [address]     Coagulation Studies: No results for input(s): LABPROT, INR in the last 72 hours.  Urinalysis: No results for input(s): COLORURINE, LABSPEC, PHURINE, GLUCOSEU, HGBUR, BILIRUBINUR, KETONESUR, PROTEINUR, UROBILINOGEN, NITRITE, LEUKOCYTESUR in the last 72 hours.  Invalid input(s): APPERANCEUR    Imaging: No results found.   Medications:   . sodium chloride     . amLODipine  5 mg Oral Daily  . amoxicillin-clavulanate  1 tablet Oral BID  . carvedilol  6.25 mg Oral BID WC  . Chlorhexidine Gluconate Cloth  6 each Topical Daily  . feeding supplement (GLUCERNA SHAKE)  237 mL Oral TID BM  . furosemide  80 mg Oral QODAY  . heparin  5,000 Units Subcutaneous Q8H  . insulin aspart  0-6 Units Subcutaneous TID WC  . sodium chloride flush   3 mL Intravenous Q12H   sodium chloride, acetaminophen, ondansetron (ZOFRAN) IV, sodium chloride flush  Assessment/ Plan:  Ms. Nollie Shiflett is a 69 y.o. Hispanic female with end stage renal disease on hemodialysis, diabetes mellitus, hypertension who was admitted today, 09/07/2019 , for ESRD (end stage renal disease) ([place]) [N18.6] Acute respiratory failure with hypoxia (HCC) [J96.01] Acute on chronic diastolic CHF (congestive heart failure) ([place]) [I50.33]   [facility] TTS [facility] RIJ permcath  1. End Stage renal disease: extra dialysis treatment yesterday. Seen and examined on regularly scheduled dialysis treatment. Tolerating treatment well.   2. Hypertension: elevated 165/77. Current regimen of furosemide and amlodipine  3. Acute respiratory failure required noninvasive mechanical  ventilation.  Secondary to pulmonary edema and pneumonia.  - Emergent dialysis on admission - Empiric antibiotics: Augmentin  4. Anemia of chronic kidney disease: hemoglobin 8.  Holding EPO due to hypertension. Mircera as outpatient.    LOS: 1 Clem Wisenbaker 5/18/20211:49 PM

## 2019-09-09 NOTE — Progress Notes (Signed)
Pt discharged per MD order. IV removed. Discharge instructions reviewed with pts daughter in law. Pt wheeled downstairs in wheelchair by staff. RN demonstrated how to turn on O2 compressor.

## 2019-09-09 NOTE — TOC Progression Note (Addendum)
Transition of Care [facility]) - Progression Note    Patient Details  Name: Amber Dixon MRN: [id number] Date of Birth: [birth year]  [facility] CM/SW [place], LCSW Phone Number: 09/09/2019, 12:46 PM  Clinical Narrative: Patient in HD. CSW called patient's daughter-in-law and let her know of plan for discharge home today. Let her know that nurse will check to see if she needs home oxygen when she returns from HD. Daughter-in-law said they can still private pay if needed. [employer] is aware. Patient will discharge on Coreg which was sent to [facility]. With GoodRx coupon, it costs $36.84 which daughter-in-law says they can afford. Daughter confirm she is not established with a PCP yet but has the information for local clinics so she can call and make an appt. CSW went ahead and called to make appt. Next available is 6/15 at 1:20 pm. Sent secure chat to Dr. Rockne Menghini to see if she would still be willing to cover her home health orders until then. Alvis Lemmings is aware of discharge for today. Home health orders are in. Either daughter-in-law or patient's son, [name], will pick her up today.    2:27 pm: Dr. Rockne Menghini has agreed to cover patient's home health orders until her new patient appt.  Expected Discharge Plan: [place] Barriers to Discharge: Continued Medical Work up  Expected Discharge Plan and Services Expected Discharge Plan: [place] arrangements for the past 2 months: Single Family Home Expected Discharge Date: 09/09/19                           [facility] Agency: [place]         Social Determinants of Health (SDOH) Interventions    Readmission Risk Interventions Readmission Risk Prevention Plan 09/05/2019 08/18/2019  Transportation Screening Complete Complete  Medication Review Press photographer) Complete Complete  PCP or Specialist appointment within 3-5 days of discharge -  Complete  HRI or Home Care Consult Complete Complete  Palliative Care Screening Complete -  [place] Not Applicable Complete

## 2019-09-09 NOTE — Progress Notes (Signed)
SATURATION QUALIFICATIONS: (This note is used to comply with regulatory documentation for home oxygen)  Patient Saturations on Room Air at Rest 96%  Patient Saturations on Room Air while Ambulating = 86%  Patient Saturations on 2 Liters of oxygen while Ambulating = 94%  Please briefly explain why patient needs home oxygen: 

## 2019-09-09 NOTE — TOC Transition Note (Addendum)
Transition of Care [facility]) - CM/SW Discharge Note   Patient Details  Name: Amber Dixon MRN: [id number] Date of Birth: 1950-04-25  [facility]) CM/SW Contact:  Candie Chroman, LCSW Phone Number: 09/09/2019, 3:23 PM   Clinical Narrative: Patient qualifies for home oxygen. Asked MD for DME order. Daughter-in-law has been notified. [employer] representative is aware and will call daughter-in-law about payment prior to delivery. Notified daughter-in-law of new PCP appt on 6/15. Sent a message to nurse letting her know that patient should be ready for discharge once oxygen is delivered to the room. No further concerns. CSW signing off.   Final next level of care: [place] Barriers to Discharge: Barriers Resolved   Patient Goals and CMS Choice     Choice offered to / list presented to : NA  Discharge Placement                Patient to be transferred to facility by: Family will take her home. Name of family member notified: [name] Patient and family notified of of transfer: 09/09/19  Discharge Plan and Services                DME Arranged: Oxygen DME Agency: [employer] Date DME Agency Contacted: 09/09/19   Representative spoke with at DME Agency: [place]: PT, OT [facility] Agency: [place] Date [place]: 09/09/19   Representative spoke with at [place]: Adela Lank  Social Determinants of Health ([place]) Interventions     Readmission Risk Interventions Readmission Risk Prevention Plan 09/05/2019 08/18/2019  Transportation Screening Complete Complete  Medication Review (RN Care Manager) Complete Complete  PCP or Specialist appointment within 3-5 days of discharge - Complete  HRI or Home Care Consult Complete Complete  Palliative Care Screening Complete -  [place] Not Applicable Complete

## 2019-09-16 ENCOUNTER — Observation Stay: Payer: Medicaid Other

## 2019-09-16 ENCOUNTER — Observation Stay
Admission: EM | Admit: 2019-09-16 | Discharge: 2019-09-17 | Disposition: A | Discharge status: Home / Self Care | Payer: Medicaid Other | Attending: Hospitalist | Admitting: Hospitalist

## 2019-09-16 ENCOUNTER — Emergency Department: Payer: Medicaid Other

## 2019-09-16 ENCOUNTER — Encounter: Payer: Self-pay | Admitting: Emergency Medicine

## 2019-09-16 ENCOUNTER — Other Ambulatory Visit: Payer: Self-pay

## 2019-09-16 DIAGNOSIS — Z992 Dependence on renal dialysis: Secondary | ICD-10-CM | POA: Diagnosis not present

## 2019-09-16 DIAGNOSIS — R519 Headache, unspecified: Secondary | ICD-10-CM | POA: Diagnosis present

## 2019-09-16 DIAGNOSIS — Z79899 Other long term (current) drug therapy: Secondary | ICD-10-CM | POA: Diagnosis not present

## 2019-09-16 DIAGNOSIS — Z794 Long term (current) use of insulin: Secondary | ICD-10-CM | POA: Diagnosis not present

## 2019-09-16 DIAGNOSIS — J9610 Chronic respiratory failure, unspecified whether with hypoxia or hypercapnia: Secondary | ICD-10-CM | POA: Insufficient documentation

## 2019-09-16 DIAGNOSIS — Z20822 Contact with and (suspected) exposure to covid-19: Secondary | ICD-10-CM | POA: Insufficient documentation

## 2019-09-16 DIAGNOSIS — R040 Epistaxis: Secondary | ICD-10-CM | POA: Diagnosis not present

## 2019-09-16 DIAGNOSIS — I5032 Chronic diastolic (congestive) heart failure: Secondary | ICD-10-CM | POA: Diagnosis present

## 2019-09-16 DIAGNOSIS — E1129 Type 2 diabetes mellitus with other diabetic kidney complication: Secondary | ICD-10-CM | POA: Diagnosis present

## 2019-09-16 DIAGNOSIS — J9611 Chronic respiratory failure with hypoxia: Secondary | ICD-10-CM

## 2019-09-16 DIAGNOSIS — E875 Hyperkalemia: Secondary | ICD-10-CM

## 2019-09-16 DIAGNOSIS — N186 End stage renal disease: Secondary | ICD-10-CM

## 2019-09-16 DIAGNOSIS — D631 Anemia in chronic kidney disease: Secondary | ICD-10-CM | POA: Insufficient documentation

## 2019-09-16 DIAGNOSIS — E871 Hypo-osmolality and hyponatremia: Secondary | ICD-10-CM | POA: Diagnosis not present

## 2019-09-16 DIAGNOSIS — I132 Hypertensive heart and chronic kidney disease with heart failure and with stage 5 chronic kidney disease, or end stage renal disease: Secondary | ICD-10-CM | POA: Diagnosis not present

## 2019-09-16 DIAGNOSIS — Z9981 Dependence on supplemental oxygen: Secondary | ICD-10-CM | POA: Diagnosis not present

## 2019-09-16 DIAGNOSIS — Z66 Do not resuscitate: Secondary | ICD-10-CM | POA: Insufficient documentation

## 2019-09-16 DIAGNOSIS — E1122 Type 2 diabetes mellitus with diabetic chronic kidney disease: Secondary | ICD-10-CM | POA: Insufficient documentation

## 2019-09-16 DIAGNOSIS — I5033 Acute on chronic diastolic (congestive) heart failure: Secondary | ICD-10-CM | POA: Diagnosis not present

## 2019-09-16 DIAGNOSIS — D500 Iron deficiency anemia secondary to blood loss (chronic): Secondary | ICD-10-CM

## 2019-09-16 LAB — CBC WITH DIFFERENTIAL/PLATELET
Abs Immature Granulocytes: 0.03 10*3/uL (ref 0.00–0.07)
Basophils Absolute: 0.1 10*3/uL (ref 0.0–0.1)
Basophils Relative: 1 %
Eosinophils Absolute: 0.1 10*3/uL (ref 0.0–0.5)
Eosinophils Relative: 1 %
HCT: 18.3 % — ABNORMAL LOW (ref 36.0–46.0)
Hemoglobin: 5.9 g/dL — ABNORMAL LOW (ref 12.0–15.0)
Immature Granulocytes: 0 %
Lymphocytes Relative: 8 %
Lymphs Abs: 0.7 10*3/uL (ref 0.7–4.0)
MCH: 27.3 pg (ref 26.0–34.0)
MCHC: 32.2 g/dL (ref 30.0–36.0)
MCV: 84.7 fL (ref 80.0–100.0)
Monocytes Absolute: 0.7 10*3/uL (ref 0.1–1.0)
Monocytes Relative: 8 %
Neutro Abs: 7.4 10*3/uL (ref 1.7–7.7)
Neutrophils Relative %: 82 %
Platelets: 269 10*3/uL (ref 150–400)
RBC: 2.16 MIL/uL — ABNORMAL LOW (ref 3.87–5.11)
RDW: 16.5 % — ABNORMAL HIGH (ref 11.5–15.5)
WBC: 9 10*3/uL (ref 4.0–10.5)
nRBC: 0 % (ref 0.0–0.2)

## 2019-09-16 LAB — HEMOGLOBIN AND HEMATOCRIT, BLOOD
HCT: 23.5 % — ABNORMAL LOW (ref 36.0–46.0)
Hemoglobin: 8.2 g/dL — ABNORMAL LOW (ref 12.0–15.0)

## 2019-09-16 LAB — COMPREHENSIVE METABOLIC PANEL WITH GFR
ALT: 12 U/L (ref 0–44)
AST: 24 U/L (ref 15–41)
Albumin: 2.5 g/dL — ABNORMAL LOW (ref 3.5–5.0)
Alkaline Phosphatase: 78 U/L (ref 38–126)
Anion gap: 10 (ref 5–15)
BUN: 44 mg/dL — ABNORMAL HIGH (ref 8–23)
CO2: 26 mmol/L (ref 22–32)
Calcium: 7.5 mg/dL — ABNORMAL LOW (ref 8.9–10.3)
Chloride: 90 mmol/L — ABNORMAL LOW (ref 98–111)
Creatinine, Ser: 4.33 mg/dL — ABNORMAL HIGH (ref 0.44–1.00)
GFR calc Af Amer: 11 mL/min — ABNORMAL LOW
GFR calc non Af Amer: 10 mL/min — ABNORMAL LOW
Glucose, Bld: 127 mg/dL — ABNORMAL HIGH (ref 70–99)
Potassium: 5.3 mmol/L — ABNORMAL HIGH (ref 3.5–5.1)
Sodium: 126 mmol/L — ABNORMAL LOW (ref 135–145)
Total Bilirubin: 0.9 mg/dL (ref 0.3–1.2)
Total Protein: 5.5 g/dL — ABNORMAL LOW (ref 6.5–8.1)

## 2019-09-16 LAB — APTT: aPTT: 40 seconds — ABNORMAL HIGH (ref 24–36)

## 2019-09-16 LAB — PREPARE RBC (CROSSMATCH)

## 2019-09-16 LAB — GLUCOSE, CAPILLARY
Glucose-Capillary: 72 mg/dL (ref 70–99)
Glucose-Capillary: 118 mg/dL — ABNORMAL HIGH (ref 70–99)

## 2019-09-16 LAB — BRAIN NATRIURETIC PEPTIDE: B Natriuretic Peptide: 3073.7 pg/mL — ABNORMAL HIGH (ref 0.0–100.0)

## 2019-09-16 LAB — PROCALCITONIN: Procalcitonin: 0.19 ng/mL

## 2019-09-16 LAB — SARS CORONAVIRUS 2 BY RT PCR (HOSPITAL ORDER, PERFORMED IN ~~LOC~~ HOSPITAL LAB): SARS Coronavirus 2: NEGATIVE

## 2019-09-16 LAB — PROTIME-INR
INR: 1.1 (ref 0.8–1.2)
Prothrombin Time: 14.2 s (ref 11.4–15.2)

## 2019-09-16 MED ORDER — ACETAMINOPHEN 325 MG PO TABS
650.0000 mg | ORAL_TABLET | Freq: Four times a day (QID) | ORAL | Status: DC | PRN
  Administered 2019-09-17: 650 mg via ORAL
  Filled 2019-09-16: qty 2

## 2019-09-16 MED ORDER — INSULIN ASPART 100 UNIT/ML ~~LOC~~ SOLN: 0.0000 [IU] | Freq: Three times a day (TID) | SUBCUTANEOUS | Status: DC

## 2019-09-16 MED ORDER — INSULIN ASPART 100 UNIT/ML ~~LOC~~ SOLN: 0.0000 [IU] | SUBCUTANEOUS | Status: DC

## 2019-09-16 MED ORDER — GLUCERNA SHAKE PO LIQD
237.0000 mL | Freq: Three times a day (TID) | ORAL | Status: DC
  Administered 2019-09-17: 237 mL via ORAL

## 2019-09-16 MED ORDER — LABETALOL HCL 5 MG/ML IV SOLN: 10.0000 mg | Freq: Once | INTRAVENOUS | Status: DC

## 2019-09-16 MED ORDER — MORPHINE SULFATE (PF) 2 MG/ML IV SOLN
1.0000 mg | Freq: Once | INTRAVENOUS | Status: AC
  Administered 2019-09-16: 1 mg via INTRAVENOUS
  Filled 2019-09-16: qty 1

## 2019-09-16 MED ORDER — CHLORHEXIDINE GLUCONATE CLOTH 2 % EX PADS
6.0000 | MEDICATED_PAD | Freq: Every day | CUTANEOUS | Status: DC
  Administered 2019-09-16 – 2019-09-17 (×2): 6 via TOPICAL
  Filled 2019-09-16 (×2): qty 6

## 2019-09-16 MED ORDER — FUROSEMIDE 40 MG PO TABS
80.0000 mg | ORAL_TABLET | ORAL | Status: DC
  Administered 2019-09-16: 80 mg via ORAL
  Filled 2019-09-16 (×3): qty 2

## 2019-09-16 MED ORDER — INSULIN ASPART PROT & ASPART (70-30 MIX) 100 UNIT/ML ~~LOC~~ SUSP: 4.0000 [IU] | Freq: Every day | SUBCUTANEOUS | Status: DC

## 2019-09-16 MED ORDER — AMLODIPINE BESYLATE 5 MG PO TABS
5.0000 mg | ORAL_TABLET | Freq: Every day | ORAL | Status: DC
  Administered 2019-09-16 – 2019-09-17 (×2): 5 mg via ORAL
  Filled 2019-09-16 (×3): qty 1

## 2019-09-16 MED ORDER — IPRATROPIUM-ALBUTEROL 0.5-2.5 (3) MG/3ML IN SOLN: 3.0000 mL | Freq: Four times a day (QID) | RESPIRATORY_TRACT | Status: DC

## 2019-09-16 MED ORDER — CARVEDILOL 6.25 MG PO TABS
6.2500 mg | ORAL_TABLET | Freq: Two times a day (BID) | ORAL | Status: DC
  Administered 2019-09-16 – 2019-09-17 (×3): 6.25 mg via ORAL
  Filled 2019-09-16 (×4): qty 1

## 2019-09-16 MED ORDER — ALBUTEROL SULFATE HFA 108 (90 BASE) MCG/ACT IN AERS
2.0000 | INHALATION_SPRAY | RESPIRATORY_TRACT | Status: DC | PRN
  Filled 2019-09-16: qty 6.7

## 2019-09-16 MED ORDER — PROMETHAZINE HCL 25 MG/ML IJ SOLN
12.5000 mg | Freq: Once | INTRAMUSCULAR | Status: AC
  Administered 2019-09-16: 12.5 mg via INTRAVENOUS
  Filled 2019-09-16: qty 1

## 2019-09-16 MED ORDER — HYDRALAZINE HCL 20 MG/ML IJ SOLN
5.0000 mg | INTRAMUSCULAR | Status: DC | PRN
  Administered 2019-09-16 (×2): 5 mg via INTRAVENOUS
  Filled 2019-09-16 (×2): qty 1

## 2019-09-16 MED ORDER — SODIUM CHLORIDE 0.9% IV SOLUTION
Freq: Once | INTRAVENOUS | Status: DC
  Filled 2019-09-16: qty 250

## 2019-09-16 MED ORDER — DM-GUAIFENESIN ER 30-600 MG PO TB12: 1.0000 | ORAL_TABLET | Freq: Two times a day (BID) | ORAL | Status: DC | PRN

## 2019-09-16 MED ORDER — LABETALOL HCL 5 MG/ML IV SOLN
10.0000 mg | Freq: Once | INTRAVENOUS | Status: AC
  Administered 2019-09-16: 10 mg via INTRAVENOUS
  Filled 2019-09-16: qty 4

## 2019-09-16 MED ORDER — EPOETIN ALFA 10000 UNIT/ML IJ SOLN: 4000.0000 [IU] | INTRAMUSCULAR | Status: DC

## 2019-09-16 MED ORDER — CEPHALEXIN 500 MG PO CAPS
500.0000 mg | ORAL_CAPSULE | Freq: Once | ORAL | Status: AC
  Administered 2019-09-16: 500 mg via ORAL
  Filled 2019-09-16: qty 1

## 2019-09-16 MED ORDER — ONDANSETRON HCL 4 MG/2ML IJ SOLN
4.0000 mg | Freq: Three times a day (TID) | INTRAMUSCULAR | Status: DC | PRN
  Administered 2019-09-16: 4 mg via INTRAVENOUS
  Filled 2019-09-16: qty 2

## 2019-09-16 NOTE — Progress Notes (Signed)
[facility]  ROUNDING NOTE   Subjective:  Patient presents to the hospital with epistaxis from right nares Conversation via video Spanish translator Patient and her son report that she had significant nosebleed from right nares since early morning.  She may have swallowed some blood and then had nausea and vomited blood with clots She also has mild cough and some shortness of breath Continued on oxygen supplementation by nasal cannula   In the ER patient underwent nasal packing which has helped with the bleeding.  However, her hemoglobin dropped to 5.9. Nephrology consult has been requested to arrange hemodialysis  Objective:  Vital signs in last 24 hours:  Temp:  [98 F (36.7 C)] 98 F (36.7 C) (05/25 0728) Pulse Rate:  [76-80] 76 (05/25 1100) Resp:  [14-24] 14 (05/25 1100) BP: (170-187)/(54-76) 179/72 (05/25 1100) SpO2:  [83 %-99 %] 94 % (05/25 1100) Weight:  [36.5 kg] 36.5 kg (05/25 0630)  Weight change:  Filed Weights   09/16/19 0630  Weight: 36.5 kg    Intake/Output: No intake/output data recorded.   Intake/Output this shift:  No intake/output data recorded.  Physical Exam: General: Critically ill, thin, cachectic  Head: Normocephalic, atraumatic. Moist oral mucosal membranes, right nasal packing  Eyes: Anicteric,   Neck: Supple, trachea midline  Lungs:   Mild bilateral crackles, normal effort  Heart: Regular rate and rhythm  Abdomen:  Soft, nontender,   Extremities: trace peripheral edema.  Neurologic: Nonfocal, able to follow commands  Skin: No lesions  Access: Right arm AVF RIJ permcath    Basic Metabolic Panel: Recent Labs  Lab 09/16/19 0632  NA 126*  K 5.3*  CL 90*  CO2 26  GLUCOSE 127*  BUN 44*  CREATININE 4.33*  CALCIUM 7.5*    Liver Function Tests: Recent Labs  Lab 09/16/19 0632  AST 24  ALT 12  ALKPHOS 78  BILITOT 0.9  PROT 5.5*  ALBUMIN 2.5*   No results for input(s): LIPASE, AMYLASE in the last 168 hours. No  results for input(s): AMMONIA in the last 168 hours.  CBC: Recent Labs  Lab 09/16/19 0703  WBC 9.0  NEUTROABS 7.4  HGB 5.9*  HCT 18.3*  MCV 84.7  PLT 269    Cardiac Enzymes: No results for input(s): CKTOTAL, CKMB, CKMBINDEX, TROPONINI in the last 168 hours.  BNP: Invalid input(s): POCBNP  CBG: Recent Labs  Lab 09/09/19 1409  GLUCAP 100*    Microbiology: Results for orders placed or performed during the hospital encounter of 09/07/19  SARS Coronavirus 2 by RT PCR (hospital order, performed in [facility] hospital lab) Nasopharyngeal Nasopharyngeal Swab     Status: None   Collection Time: 09/07/19  9:26 AM   Specimen: Nasopharyngeal Swab  Result Value Ref Range Status   SARS Coronavirus 2 NEGATIVE NEGATIVE Final    Comment: (NOTE) SARS-CoV-2 target nucleic acids are NOT DETECTED. The SARS-CoV-2 RNA is generally detectable in upper and lower respiratory specimens during the acute phase of infection. The lowest concentration of SARS-CoV-2 viral copies this assay can detect is 250 copies / mL. A negative result does not preclude SARS-CoV-2 infection and should not be used as the sole basis for treatment or other patient management decisions.  A negative result may occur with improper specimen collection / handling, submission of specimen other than nasopharyngeal swab, presence of viral mutation(s) within the areas targeted by this assay, and inadequate number of viral copies (<250 copies / mL). A negative result must be combined with clinical  observations, patient history, and epidemiological information. Fact Sheet for Patients:   StrictlyIdeas.no Fact Sheet for Healthcare Providers: BankingDealers.co.za This test is not yet approved or cleared  by the [place] FDA and has been authorized for detection and/or diagnosis of SARS-CoV-2 by FDA under an Emergency Use Authorization (EUA).  This EUA will remain in effect  (meaning this test can be used) for the duration of the COVID-19 declaration under Section 564(b)(1) of the Act, 21 U.S.C. section 360bbb-3(b)(1), unless the authorization is terminated or revoked sooner. Performed at [facility], [address] [place], [place] [address]     Coagulation Studies: Recent Labs    09/16/19 0626  LABPROT 14.2  INR 1.1    Urinalysis: No results for input(s): COLORURINE, LABSPEC, PHURINE, GLUCOSEU, HGBUR, BILIRUBINUR, KETONESUR, PROTEINUR, UROBILINOGEN, NITRITE, LEUKOCYTESUR in the last 72 hours.  Invalid input(s): APPERANCEUR    Imaging: CT HEAD WO CONTRAST  Result Date: 09/16/2019 CLINICAL DATA:  Headaches and epistaxis EXAM: CT HEAD WITHOUT CONTRAST TECHNIQUE: Contiguous axial images were obtained from the base of the skull through the vertex without intravenous contrast. COMPARISON:  08/16/2019 FINDINGS: Brain: Scattered atrophic changes and chronic white matter ischemic change is seen. The overall appearance is stable from the prior study. No findings to suggest acute hemorrhage, acute infarction or space-occupying mass lesion are seen. Vascular: No hyperdense vessel or unexpected calcification. Skull: Normal. Negative for fracture or focal lesion. Sinuses/Orbits: No acute finding. Other: None. IMPRESSION: Chronic atrophic and ischemic changes without acute abnormality. Electronically Signed   By: Inez Catalina M.D.   On: 09/16/2019 10:29   DG Chest Port 1 View  Result Date: 09/16/2019 CLINICAL DATA:  Epistaxis, respiratory distress EXAM: PORTABLE CHEST 1 VIEW COMPARISON:  Radiograph 09/07/2019 FINDINGS: Tunneled right IJ approach dual lumen dialysis catheter tip terminates in similar position near the expected level of the right atrium. There is increasing bilateral pleural effusions, right greater than left with adjacent areas of mixed heterogeneous opacity likely reflecting a combination of volume loss and airspace disease, possibly  alveolar edema and/or infectious consolidation. No visible pneumothorax. Suspect cardiomegaly though portions of the heart borders are obscured by opacity. Calcified tortuous aorta is similar to prior. No acute osseous or soft tissue abnormality. Degenerative changes are present in the imaged spine and shoulders. Telemetry leads overlie the chest. IMPRESSION: Increasing bilateral pleural effusions, right greater than left with adjacent areas of mixed heterogeneous opacity likely reflecting a combination of volume loss and airspace disease, possibly alveolar edema and/or infectious consolidation. Electronically Signed   By: Lovena Le M.D.   On: 09/16/2019 06:56     Medications:    . sodium chloride   Intravenous Once   acetaminophen, albuterol, dextromethorphan-guaiFENesin, hydrALAZINE, ondansetron (ZOFRAN) IV  Assessment/ Plan:  [name] is a 69 y.o. Hispanic female with end stage renal disease on hemodialysis, diabetes mellitus, hypertension who was admitted today, 09/16/2019 , for Epistaxis [R04.0]   [facility] T[facility] RIJ permcath  #Severe anemia caused by blood loss from epistaxis Patient received blood transfusion during dialysis today Schedule Epogen with hemodialysis  #End Stage renal disease with mild hyperkalemia We will arrange for hemodialysis on her usual schedule while she is in the hospital 2K bath to be used today     LOS: 0 Atticus Wedin 5/25/202111:15 AM

## 2019-09-16 NOTE — ED Triage Notes (Signed)
Pt to STAT via w/c, actively vomiting bright red blood with clots; accomp by family member--both nonenglish speaking; pt taken to room 17; assisted into hosp gown; MD called to room; bleeding noted from rt nare; dialysis shunt noted to rt upper chest

## 2019-09-16 NOTE — ED Notes (Addendum)
Dr Beather Arbour inserted packing to rt nare; pt tolerated well; son reports pt normally on O2 @2l /min via [place] at home

## 2019-09-16 NOTE — Progress Notes (Signed)
Transfusion started

## 2019-09-16 NOTE — Progress Notes (Signed)
Transfusion complete

## 2019-09-16 NOTE — Progress Notes (Signed)
Hemodialysis patient known at [place] 12:15, Family transports. Please contact me with any dialysis placement concerns.   Elvera Bicker Dialysis Coordinator [phone]

## 2019-09-16 NOTE — ED Provider Notes (Addendum)
Patient now has a hemoglobin hematocrit of 5 and 18 which is down from previously.  Her sodium was 128 correction 126.  Chest x-ray looks worse and she is hypoxic although she is chronically on oxygen.  Patient apparently has worsening CHF and needs dialysis.  She also needs a blood transfusion.  I believe she would benefit from admission to accomplish shoulders.   Nena Polio, MD 09/16/19 4695    Nena Polio, MD 09/16/19 [phone]

## 2019-09-16 NOTE — H&P (Signed)
History and Physical    [name] MGQ:[id number] DOB: 09-Jul-1950 DOA: 09/16/2019  Referring MD/NP/PA:   PCP: Patient, No Pcp Per   Patient coming from:  The patient is coming from home.  At baseline, pt is independent for most of ADL.        Chief Complaint: Nosebleeding  HPI: Amber Dixon is a 69 y.o. female with medical history significant of ESRD-HD (TTS), hypertension, diabetes mellitus, chronic respiratory failure on 2 L nasal cannula oxygen, CHF, anemia, who presents with nose bleeding.  Both patient and her son who is at bedside do not speak [place]. History is obtained with help of iPad Spanish translator. Patient states that she started having right-sided nosebleed since 3 AM.  Son states patient swallowed a lot of blood, then had nausea and vomited bright red blood with clot. Patient has chronic mild cough, mild shortness of breath, on 2 L nasal cannula oxygen, which has not changed. No chest pain. Denies nausea, vomiting, diarrhea, abdominal pain, symptoms of UTI or unilateral weakness. Patient reports throbbing headache which is 10 out of 10 in severity, involving her whole head, nonradiating. No facial droop or slurred speech. No vision change or hearing loss. Denies using anticoagulation.   ED physician did posterior Rhino rocket packing and her nose bleeding stopped in ED. patient had oxygen desaturation to 86% at arrival, which improved to 98% on home level 2 L nasal cannula oxygen currently.  ED Course: pt was found to have hemoglobin dropped from 8.0 on 09/09/2019 to 5.9, INR 1.1, pending COVID-19 PCR, WBC 9.0, sodium 126, potassium 5.3, bicarbonate 26, creatinine 4.33, BUN 44, temperature normal, blood pressure 170/54, heart rate 79, RR 19. Chest x-ray showed bilateral pleural effusion. Patient is placed on MedSurg for observation. Renal, Dr. Candiss Norse is consulted.  Review of Systems:   General: no fevers, chills, no body weight gain, has poor  appetite, has fatigue and headache HEENT: no blurry vision, hearing changes or sore throat. Right sided note bleeding. Respiratory: has chronic dyspnea, coughing, no wheezing. CV: no chest pain, no palpitations GI: No abdominal pain, diarrhea, constipation. Has nausea vomiting. GU: no dysuria, burning on urination, increased urinary frequency, hematuria  Ext: no leg edema Neuro: no unilateral weakness, numbness, or tingling, no vision change or hearing loss Skin: no rash, no skin tear. MSK: No muscle spasm, no deformity, no limitation of range of movement in spin Heme: No easy bruising.  Travel history: No recent long distant travel.  Allergy: No Known Allergies  Past Medical History:  Diagnosis Date  . Diabetes mellitus without complication ([place])   . Hypertension   . Renal disorder     Past Surgical History:  Procedure Laterality Date  . AV FISTULA PLACEMENT Right 04/24/2019   Procedure: ARTERIOVENOUS (AV) FISTULA CREATION RIGHT ARM;  Surgeon: Waynetta Sandy, MD;  Location: [facility];  Service: Vascular;  Laterality: Right;  . AV FISTULA PLACEMENT Right   . HIP FRACTURE SURGERY Right    in [place]  . IR FLUORO GUIDE CV LINE RIGHT  04/02/2019  . IR FLUORO GUIDE CV LINE RIGHT  04/13/2019  . IR US GUIDE VASC ACCESS RIGHT  04/02/2019  . LEG SURGERY      Social History:  reports that she has never smoked. She has never used smokeless tobacco. She reports previous drug use. No history on file for alcohol.  Family History: No family history on file.   Prior to Admission medications   Medication Sig  Start Date End Date Taking? Authorizing Provider  acetaminophen (TYLENOL) 325 MG tablet Take 650 mg by mouth every 6 (six) hours as needed.    [provider]  amLODipine (NORVASC) 5 MG tablet Take 1 tablet (5 mg total) by mouth daily. 09/06/19   Danford, Suann Larry, MD  carvedilol (COREG) 6.25 MG tablet Take 1 tablet (6.25 mg total) by mouth 2 (two) times daily with a  meal. 09/09/19 12/08/19  Enzo Bi, MD  feeding supplement, GLUCERNA SHAKE, (GLUCERNA SHAKE) LIQD Take 237 mLs by mouth 3 (three) times daily between meals. 09/09/19   Enzo Bi, MD  furosemide (LASIX) 80 MG tablet Take 1 tablet (80 mg total) by mouth every other day. 09/06/19   Danford, Suann Larry, MD  INSULIN NPH ISOPHANE & REGULAR [place] Inject 5 Units into the skin every evening.    [provider]    Physical Exam: Vitals:   09/16/19 0800 09/16/19 0915 09/16/19 1000 09/16/19 1100  BP: (!) 170/54 (!) 184/75 (!) 184/70 (!) 179/72  Pulse:   77 76  Resp: 19 15 (!) 24 14  Temp:      TempSrc:      SpO2:   99% 94%  Weight:      Height:       General: Not in acute distress HEENT: s/p of right sided nose packing with Rhino [place], no active bleeding currently.        Eyes: PERRL, EOMI, no scleral icterus.       ENT: no pharynx injection, no tonsillar enlargement.        Neck: No JVD, no bruit, no mass felt. Heme: No neck lymph node enlargement. Cardiac: S1/S2, RRR, No murmurs, No gallops or rubs. Respiratory: No rales, wheezing, rhonchi or rubs. GI: Soft, nondistended, nontender, no rebound pain, no organomegaly, BS present. GU: No hematuria Ext: No pitting leg edema bilaterally. 1+DP/PT pulse bilaterally. Musculoskeletal: No joint deformities, No joint redness or warmth, no limitation of ROM in spin. Skin: No rashes.  Neuro: Alert, oriented X3, cranial nerves II-XII grossly intact, moves all extremities normally.  Psych: Patient is not psychotic, no suicidal or hemocidal ideation.  Labs on Admission: I have personally reviewed following labs and imaging studies  CBC: Recent Labs  Lab 09/16/19 0703  WBC 9.0  NEUTROABS 7.4  HGB 5.9*  HCT 18.3*  MCV 84.7  PLT 270   Basic Metabolic Panel: Recent Labs  Lab 09/16/19 0632  NA 126*  K 5.3*  CL 90*  CO2 26  GLUCOSE 127*  BUN 44*  CREATININE 4.33*  CALCIUM 7.5*   GFR: Estimated Creatinine Clearance: 7 mL/min (A)  (by C-G formula based on SCr of 4.33 mg/dL (H)). Liver Function Tests: Recent Labs  Lab 09/16/19 0632  AST 24  ALT 12  ALKPHOS 78  BILITOT 0.9  PROT 5.5*  ALBUMIN 2.5*   No results for input(s): LIPASE, AMYLASE in the last 168 hours. No results for input(s): AMMONIA in the last 168 hours. Coagulation Profile: Recent Labs  Lab 09/16/19 0632  INR 1.1   Cardiac Enzymes: No results for input(s): CKTOTAL, CKMB, CKMBINDEX, TROPONINI in the last 168 hours. BNP (last 3 results) No results for input(s): PROBNP in the last 8760 hours. HbA1C: No results for input(s): HGBA1C in the last 72 hours. CBG: Recent Labs  Lab 09/09/19 1409  GLUCAP 100*   Lipid Profile: No results for input(s): CHOL, HDL, LDLCALC, TRIG, CHOLHDL, LDLDIRECT in the last 72 hours. Thyroid Function Tests: No results  for input(s): TSH, T4TOTAL, FREET4, T3FREE, THYROIDAB in the last 72 hours. Anemia Panel: No results for input(s): VITAMINB12, FOLATE, FERRITIN, TIBC, IRON, RETICCTPCT in the last 72 hours. Urine analysis:    Component Value Date/Time   COLORURINE YELLOW (A) 09/01/2019 0442   APPEARANCEUR HAZY (A) 09/01/2019 0442   LABSPEC 1.015 09/01/2019 0442   PHURINE 9.0 (H) 09/01/2019 0442   GLUCOSEU >=500 (A) 09/01/2019 0442   HGBUR NEGATIVE 09/01/2019 0442   BILIRUBINUR NEGATIVE 09/01/2019 0442   KETONESUR NEGATIVE 09/01/2019 0442   PROTEINUR >=300 (A) 09/01/2019 0442   NITRITE NEGATIVE 09/01/2019 0442   LEUKOCYTESUR NEGATIVE 09/01/2019 0442   Sepsis Labs: @LABRCNTIP (procalcitonin:4,lacticidven:4) ) Recent Results (from the past 240 hour(s))  SARS Coronavirus 2 by RT PCR (hospital order, performed in [facility] hospital lab) Nasopharyngeal Nasopharyngeal Swab     Status: None   Collection Time: 09/07/19  9:26 AM   Specimen: Nasopharyngeal Swab  Result Value Ref Range Status   SARS Coronavirus 2 NEGATIVE NEGATIVE Final    Comment: (NOTE) SARS-CoV-2 target nucleic acids are NOT DETECTED. The  SARS-CoV-2 RNA is generally detectable in upper and lower respiratory specimens during the acute phase of infection. The lowest concentration of SARS-CoV-2 viral copies this assay can detect is 250 copies / mL. A negative result does not preclude SARS-CoV-2 infection and should not be used as the sole basis for treatment or other patient management decisions.  A negative result may occur with improper specimen collection / handling, submission of specimen other than nasopharyngeal swab, presence of viral mutation(s) within the areas targeted by this assay, and inadequate number of viral copies (<250 copies / mL). A negative result must be combined with clinical observations, patient history, and epidemiological information. Fact Sheet for Patients:   StrictlyIdeas.no Fact Sheet for Healthcare Providers: BankingDealers.co.za This test is not yet approved or cleared  by the [place] FDA and has been authorized for detection and/or diagnosis of SARS-CoV-2 by FDA under an Emergency Use Authorization (EUA).  This EUA will remain in effect (meaning this test can be used) for the duration of the COVID-19 declaration under Section 564(b)(1) of the Act, 21 U.S.C. section 360bbb-3(b)(1), unless the authorization is terminated or revoked sooner. Performed at [facility], [address] [place], [place] [address]      Radiological Exams on Admission: CT HEAD WO CONTRAST  Result Date: 09/16/2019 CLINICAL DATA:  Headaches and epistaxis EXAM: CT HEAD WITHOUT CONTRAST TECHNIQUE: Contiguous axial images were obtained from the base of the skull through the vertex without intravenous contrast. COMPARISON:  08/16/2019 FINDINGS: Brain: Scattered atrophic changes and chronic white matter ischemic change is seen. The overall appearance is stable from the prior study. No findings to suggest acute hemorrhage, acute infarction or space-occupying mass  lesion are seen. Vascular: No hyperdense vessel or unexpected calcification. Skull: Normal. Negative for fracture or focal lesion. Sinuses/Orbits: No acute finding. Other: None. IMPRESSION: Chronic atrophic and ischemic changes without acute abnormality. Electronically Signed   By: Inez Catalina M.D.   On: 09/16/2019 10:29   DG Chest Port 1 View  Result Date: 09/16/2019 CLINICAL DATA:  Epistaxis, respiratory distress EXAM: PORTABLE CHEST 1 VIEW COMPARISON:  Radiograph 09/07/2019 FINDINGS: Tunneled right IJ approach dual lumen dialysis catheter tip terminates in similar position near the expected level of the right atrium. There is increasing bilateral pleural effusions, right greater than left with adjacent areas of mixed heterogeneous opacity likely reflecting a combination of volume loss and airspace disease, possibly alveolar edema  and/or infectious consolidation. No visible pneumothorax. Suspect cardiomegaly though portions of the heart borders are obscured by opacity. Calcified tortuous aorta is similar to prior. No acute osseous or soft tissue abnormality. Degenerative changes are present in the imaged spine and shoulders. Telemetry leads overlie the chest. IMPRESSION: Increasing bilateral pleural effusions, right greater than left with adjacent areas of mixed heterogeneous opacity likely reflecting a combination of volume loss and airspace disease, possibly alveolar edema and/or infectious consolidation. Electronically Signed   By: Lovena Le M.D.   On: 09/16/2019 06:56     EKG: Independently reviewed. Sinus rhythm, QTC 484, LAE, low voltage, poor R wave progression  Assessment/Plan Principal Problem:   Epistaxis Active Problems:   Anemia in ESRD (end-stage renal disease) (HCC)   ESRD (end stage renal disease) on dialysis (HCC)   Type II diabetes mellitus with renal manifestations (HCC)   Chronic respiratory failure with hypoxia (HCC)   Anemia due to blood loss   Chronic diastolic CHF  (congestive heart failure) (HCC)   Hyperkalemia   Hyponatremia   Headache   Epistaxis and anemia due to blood loss in the setting of anemia in ESRD: nose bleeding has stopped after packing in ED. Hgb dropped from 8.0 -->5.9  -will place on MedSurg bed for observation -Transfuse 1 unit of blood during dialysis -Observe closely for new bleeding  ESRD (end stage renal disease) on dialysis (TTS) -Dr. Candiss Norse of renal is consulted  Type II diabetes mellitus with renal manifestations [facility]): Most recent A1c 5.1, poorly controled. Patient is taking NPH 70/30 insulin at home -will decrease NPH-70/30 dose from  5 to 4 units daily -SSI  Chronic respiratory failure with hypoxia [facility]): Patient had one episode of oxygen desaturation to 83% on room air, which is likely induced by nosebleeding.  Currently patient is on 2 L home level oxygen with oxygen saturation 98% -As needed albuterol and Mucinex -Continue nasal cannula oxygen to maintain oxygen saturation above 93% on room air  Chronic diastolic CHF (congestive heart failure) ([place]): 2D echo on 03/17/2019 showed EF of 55-60%.  Patient does not have leg edema.  CHF seem to be compensated. -Volume management per renal by dialysis  Hyperkalemia and Hyponatremia: K 5.3 and Na 126. -expecting correction after HD  Headache: pt has severe throbbing whole headache.  No focal neuro deficit on physical examination -As needed Tylenol -Follow-up CT head to rule out intracranial abnormalities --> negative for acute intracranial abnormalities.        DVT ppx: SCD Code Status: Full code Family Communication:  Yes, patient's son at bed side Disposition Plan:  Anticipate discharge back to previous environment Consults called:  Dr. Candiss Norse of renal Admission status: Med-surg bed for obs   Status is: Observation  The patient remains OBS appropriate and will d/c before 2 midnights.  Dispo: The patient is from: Home              Anticipated d/c is to:  Home              Anticipated d/c date is: 1 day              Patient currently is not medically stable to d/c.          Date of Service 09/16/2019    Ivor Costa [facility]   If 7PM-7AM, please contact night-coverage [email or web address] 09/16/2019, 11:23 AM

## 2019-09-16 NOTE — ED Provider Notes (Signed)
[facility] Emergency Department Provider Note   ____________________________________________   First MD Initiated Contact with Patient 09/16/19 0631     (approximate)  I have reviewed the triage vital signs and the nursing notes.   HISTORY  Chief Complaint Epistaxis  History obtained via [place] interpreter  HPI Amber Dixon is a 69 y.o. female who presents to the ED from home with a chief complaint of nosebleed.  Patient has a history of ESRD on HD T/TH/SAT who was recently discharged from the hospital, admitted for pulmonary edema.  Reports right-sided nosebleed since 3 AM.  Son states patient swallowed a lot of blood and is now actively vomiting bright red blood with clots.  No history of nosebleed.  Denies anticoagulation.  Patient is chronically on 2 L nasal cannula oxygen and presents to the treatment room with room air saturation 83% with brisk right epistaxis and spitting up blood clots.      Past Medical History:  Diagnosis Date  . Diabetes mellitus without complication ([place])   . Hypertension   . Renal disorder     Patient Active Problem List   Diagnosis Date Noted  . Acute respiratory failure with hypoxia ([place]) 09/08/2019  . Acute on chronic diastolic CHF (congestive heart failure) ([place]) 09/07/2019  . Protein-calorie malnutrition, severe 08/22/2019  . Altered mental status   . DNR (do not resuscitate) discussion   . Palliative care by specialist   . Encephalopathy 08/16/2019  . ESRD (end stage renal disease) ([place])   . Fluid overload 06/09/2019  . Chest pain 06/09/2019  . Elevated troponin 06/09/2019  . Diabetes mellitus type 2 in nonobese ([place]) 06/09/2019  . Anemia of chronic disease 06/09/2019  . Nausea and vomiting 06/09/2019  . Pulmonary edema 06/09/2019  . ESRD (end stage renal disease) on dialysis ([place]) 04/10/2019  . Acute respiratory failure with hypoxemia ([place]) 03/31/2019  . Acute on chronic diastolic  (congestive) heart failure ([place]) 03/24/2019  . Hypertension 03/22/2019  . Anemia of renal disease 03/22/2019  . Proteinuria 03/22/2019  . Chest pain 03/22/2019  . Acute on chronic renal failure ([place]) 03/21/2019    Past Surgical History:  Procedure Laterality Date  . AV FISTULA PLACEMENT Right 04/24/2019   Procedure: ARTERIOVENOUS (AV) FISTULA CREATION RIGHT ARM;  Surgeon: Waynetta Sandy, MD;  Location: [facility];  Service: Vascular;  Laterality: Right;  . AV FISTULA PLACEMENT Right   . HIP FRACTURE SURGERY Right    in [place]  . IR FLUORO GUIDE CV LINE RIGHT  04/02/2019  . IR FLUORO GUIDE CV LINE RIGHT  04/13/2019  . IR US GUIDE VASC ACCESS RIGHT  04/02/2019  . LEG SURGERY      Prior to Admission medications   Medication Sig Start Date End Date Taking? Authorizing Provider  acetaminophen (TYLENOL) 325 MG tablet Take 650 mg by mouth every 6 (six) hours as needed.    [provider]  amLODipine (NORVASC) 5 MG tablet Take 1 tablet (5 mg total) by mouth daily. 09/06/19   Danford, Suann Larry, MD  carvedilol (COREG) 6.25 MG tablet Take 1 tablet (6.25 mg total) by mouth 2 (two) times daily with a meal. 09/09/19 12/08/19  Enzo Bi, MD  feeding supplement, GLUCERNA SHAKE, (GLUCERNA SHAKE) LIQD Take 237 mLs by mouth 3 (three) times daily between meals. 09/09/19   Enzo Bi, MD  furosemide (LASIX) 80 MG tablet Take 1 tablet (80 mg total) by mouth every other day. 09/06/19   Danford, Suann Larry,  MD  INSULIN NPH ISOPHANE & REGULAR [place] Inject 5 Units into the skin every evening.    [provider]    Allergies Patient has no known allergies.  No family history on file.  Social History Social History   Tobacco Use  . Smoking status: Never Smoker  . Smokeless tobacco: Never Used  Substance Use Topics  . Alcohol use: Not on file  . Drug use: Not Currently    Review of Systems  Constitutional: No fever/chills Eyes: No visual changes. ENT: Positive for  right-sided nosebleed.  No sore throat. Cardiovascular: Denies chest pain. Respiratory: Denies shortness of breath. Gastrointestinal: No abdominal pain.  Positive for spitting/vomiting blood clots.  No diarrhea.  No constipation. Genitourinary: Negative for dysuria. Musculoskeletal: Negative for back pain. Skin: Negative for rash. Neurological: Negative for headaches, focal weakness or numbness.   ____________________________________________   PHYSICAL EXAM:  VITAL SIGNS: ED Triage Vitals  Enc Vitals Group     BP 09/16/19 0615 (!) 172/71     Pulse Rate 09/16/19 0615 80     Resp 09/16/19 0615 20     Temp --      Temp src --      SpO2 09/16/19 0615 (!) 83 %     Weight 09/16/19 0630 80 lb 7.5 oz (36.5 kg)     Height 09/16/19 0630 4\' 11"  (1.499 m)     Head Circumference --      Peak Flow --      Pain Score 09/16/19 0630 0     Pain Loc --      Pain Edu? --      Excl. in [place]? --     Constitutional: Alert and oriented. Cachetic-appearing and in moderate acute distress. Eyes: Conjunctivae are normal. PERRL. EOMI. Head: Atraumatic. Nose: Brisk right epistaxis. Mouth/Throat: Mucous membranes are moist.  Spitting bright red blood clots. Neck: No stridor.   Cardiovascular: Normal rate, regular rhythm. Grossly normal heart sounds.  Good peripheral circulation. Respiratory: Normal respiratory effort.  No retractions. Lungs with bibasilar rales. Gastrointestinal: Soft and nontender. No distention. No abdominal bruits. No CVA tenderness. Musculoskeletal: No lower extremity tenderness nor edema.  No joint effusions. Neurologic:  Normal speech and language. No gross focal neurologic deficits are appreciated.  Skin:  Skin is warm, dry and intact. No rash noted. Psychiatric: Mood and affect are normal. Speech and behavior are normal.  ____________________________________________   LABS (all labs ordered are listed, but only abnormal results are displayed)  Labs Reviewed    COMPREHENSIVE METABOLIC PANEL - Abnormal; Notable for the following components:      Result Value   Sodium 126 (*)    Potassium 5.3 (*)    Chloride 90 (*)    Glucose, Bld 127 (*)    BUN 44 (*)    Creatinine, Ser 4.33 (*)    Calcium 7.5 (*)    Total Protein 5.5 (*)    Albumin 2.5 (*)    GFR calc non Af Amer 10 (*)    GFR calc Af Amer 11 (*)    All other components within normal limits  PROTIME-INR  CBC WITH DIFFERENTIAL/PLATELET  CBC WITH DIFFERENTIAL/PLATELET  TYPE AND SCREEN  TYPE AND SCREEN   ____________________________________________  EKG  ED ECG REPORT I, Insiya Oshea J, the attending physician, personally viewed and interpreted this ECG.   Date: 09/16/2019  EKG Time: 0644  Rate: 79  Rhythm: normal EKG, normal sinus rhythm  Axis: Normal  Intervals:none  ST&T Change: Nonspecific  ____________________________________________  RADIOLOGY  ED MD interpretation:  Pending  Official radiology report(s): DG Chest Port 1 View  Result Date: 09/16/2019 CLINICAL DATA:  Epistaxis, respiratory distress EXAM: PORTABLE CHEST 1 VIEW COMPARISON:  Radiograph 09/07/2019 FINDINGS: Tunneled right IJ approach dual lumen dialysis catheter tip terminates in similar position near the expected level of the right atrium. There is increasing bilateral pleural effusions, right greater than left with adjacent areas of mixed heterogeneous opacity likely reflecting a combination of volume loss and airspace disease, possibly alveolar edema and/or infectious consolidation. No visible pneumothorax. Suspect cardiomegaly though portions of the heart borders are obscured by opacity. Calcified tortuous aorta is similar to prior. No acute osseous or soft tissue abnormality. Degenerative changes are present in the imaged spine and shoulders. Telemetry leads overlie the chest. IMPRESSION: Increasing bilateral pleural effusions, right greater than left with adjacent areas of mixed heterogeneous opacity likely  reflecting a combination of volume loss and airspace disease, possibly alveolar edema and/or infectious consolidation. Electronically Signed   By: Lovena Le M.D.   On: 09/16/2019 06:56    ____________________________________________   PROCEDURES  Procedure(s) performed (including Critical Care):  .1-3 Lead EKG Interpretation Performed by: Paulette Blanch, MD Authorized by: Paulette Blanch, MD     Interpretation: normal     ECG rate:  79   ECG rate assessment: normal     Rhythm: sinus rhythm     Ectopy: none     Conduction: normal   Comments:     Patient placed on cardiac monitor to monitor for arrhythmias .Epistaxis Management  Date/Time: 09/16/2019 6:37 AM Performed by: Paulette Blanch, MD Authorized by: Paulette Blanch, MD   Consent:    Consent obtained:  Verbal   Consent given by:  Patient   Risks discussed:  Bleeding, infection, nasal injury and pain Anesthesia (see MAR for exact dosages):    Anesthesia method:  Topical application   Topical anesthetic:  Lidocaine gel Procedure details:    Treatment site:  Unable to specify   Treatment method:  Nasal balloon Post-procedure details:    Assessment:  Bleeding stopped   Patient tolerance of procedure:  Tolerated well, no immediate complications Comments:     Brisk bleeding from right nare; unable to specify anterior versus posterior.  Posterior Rhino Rocket placed with good effect.     ____________________________________________   INITIAL IMPRESSION / ASSESSMENT AND PLAN / ED COURSE  As part of my medical decision making, I reviewed the following data within the [place] History obtained from family, Nursing notes reviewed and incorporated, Labs reviewed, EKG interpreted, Old chart reviewed, Radiograph reviewed and Notes from prior ED visits     Arianis Bowditch was evaluated in Emergency Department on 09/16/2019 for the symptoms described in the history of present illness. She was  evaluated in the context of the global COVID-19 pandemic, which necessitated consideration that the patient might be at risk for infection with the SARS-CoV-2 virus that causes COVID-19. Institutional protocols and algorithms that pertain to the evaluation of patients at risk for COVID-19 are in a state of rapid change based on information released by regulatory bodies including the CDC and federal and state organizations. These policies and algorithms were followed during the patient's care in the ED.    69 year old female presenting with right epistaxis, history of ESRD due for dialysis today.  Recent hospitalization for respiratory distress, on home oxygen.  Differential diagnosis includes but is not limited to bleeding secondary  to hypertension, dry nose secondary to oxygen use, sinus congestion, pulmonary edema, etc.  Patient tolerated posterior Rhino Rocket well.  Bleeding stopped.  Nasal cannula oxygen placed in patient's mouth which increases her oxygenation to 96%.  Will obtain lab work, chest x-ray and monitor in the ED.  Start Keflex for nosebleed requiring nasal packing.  Clinical Course as of Sep 16 710  Tue Sep 16, 2019  0639 Care transferred to Dr. Cinda Quest at change of shift pending labs, cxr, reassessment. Disposition pending results.   [JS]  7253 No bleeding through [employer].  No bleeding and posterior oropharynx.   [JS]    Clinical Course User Index [JS] Paulette Blanch, MD     ____________________________________________   FINAL CLINICAL IMPRESSION(S) / ED DIAGNOSES  Final diagnoses:  Right-sided epistaxis     ED Discharge Orders    None       Note:  This document was prepared using Dragon voice recognition software and may include unintentional dictation errors.   Paulette Blanch, MD 09/16/19 [phone]

## 2019-09-16 NOTE — Progress Notes (Signed)
PRE HD   

## 2019-09-16 NOTE — Progress Notes (Signed)
HD started. 

## 2019-09-16 NOTE — Progress Notes (Signed)
HD TX ended

## 2019-09-17 LAB — TYPE AND SCREEN
ABO/RH(D): O POS
Antibody Screen: NEGATIVE
Unit division: 0

## 2019-09-17 LAB — BASIC METABOLIC PANEL
Anion gap: 10 (ref 5–15)
BUN: 15 mg/dL (ref 8–23)
CO2: 29 mmol/L (ref 22–32)
Calcium: 7.7 mg/dL — ABNORMAL LOW (ref 8.9–10.3)
Chloride: 98 mmol/L (ref 98–111)
Creatinine, Ser: 1.91 mg/dL — ABNORMAL HIGH (ref 0.44–1.00)
GFR calc Af Amer: 30 mL/min — ABNORMAL LOW (ref >60)
GFR calc non Af Amer: 26 mL/min — ABNORMAL LOW (ref >60)
Glucose, Bld: 96 mg/dL (ref 70–99)
Potassium: 3.7 mmol/L (ref 3.5–5.1)
Sodium: 137 mmol/L (ref 135–145)

## 2019-09-17 LAB — BPAM RBC
Blood Product Expiration Date: 202106292359
ISSUE DATE / TIME: 202105251357
Unit Type and Rh: 5100

## 2019-09-17 LAB — GLUCOSE, CAPILLARY
Glucose-Capillary: 122 mg/dL — ABNORMAL HIGH (ref 70–99)
Glucose-Capillary: 164 mg/dL — ABNORMAL HIGH (ref 70–99)
Glucose-Capillary: 267 mg/dL — ABNORMAL HIGH (ref 70–99)
Glucose-Capillary: 73 mg/dL (ref 70–99)
Glucose-Capillary: 86 mg/dL (ref 70–99)

## 2019-09-17 LAB — CBC
HCT: 22.6 % — ABNORMAL LOW (ref 36.0–46.0)
Hemoglobin: 7.8 g/dL — ABNORMAL LOW (ref 12.0–15.0)
MCH: 28.4 pg (ref 26.0–34.0)
MCHC: 34.5 g/dL (ref 30.0–36.0)
MCV: 82.2 fL (ref 80.0–100.0)
Platelets: 203 10*3/uL (ref 150–400)
RBC: 2.75 MIL/uL — ABNORMAL LOW (ref 3.87–5.11)
RDW: 15.8 % — ABNORMAL HIGH (ref 11.5–15.5)
WBC: 8 10*3/uL (ref 4.0–10.5)
nRBC: 0 % (ref 0.0–0.2)

## 2019-09-17 MED ORDER — LACTULOSE 10 GM/15ML PO SOLN
10.0000 g | Freq: Once | ORAL | Status: AC
  Administered 2019-09-17: 10 g via ORAL
  Filled 2019-09-17: qty 30

## 2019-09-17 MED ORDER — BISACODYL 10 MG RE SUPP
10.0000 mg | Freq: Once | RECTAL | Status: AC
  Administered 2019-09-17: 10 mg via RECTAL
  Filled 2019-09-17: qty 1

## 2019-09-17 MED ORDER — NEPRO/CARBSTEADY PO LIQD
237.0000 mL | Freq: Three times a day (TID) | ORAL | Status: DC
  Administered 2019-09-17: 237 mL via ORAL

## 2019-09-17 MED ORDER — POLYETHYLENE GLYCOL 3350 17 G PO PACK
17.0000 g | PACK | Freq: Two times a day (BID) | ORAL | Status: DC
  Administered 2019-09-17: 17 g via ORAL
  Filled 2019-09-17: qty 1

## 2019-09-17 NOTE — TOC Initial Note (Signed)
Transition of Care [facility]) - Initial/Assessment Note    Patient Details  Name: Amber Dixon MRN: [id number] Date of Birth: [birth year]  [facility]) CM/SW Contact:    Beverly Sessions, RN Phone Number: 09/17/2019, 11:39 AM  Clinical Narrative:                 Patient admitted from home with nose bleed.  Patient lives at home with son and daughter in law Patient will discharge home at discharge.   Previous admission patient was set up [place] for home health through [place].  Due to admissions patient has not been opened outpatient.  [place] notified of discharge today.  Will not require resumption orders as she is under observation status.   Previous admission patient received WC and BSC.  Family is private paying for home O2 through [employer]  Family to bring portable O2 for discharge.   RNCM provided Tommi Rumps with Alvis Lemmings contact information for patient's daughter in law Expected Discharge Plan: [place] Barriers to Discharge: No Barriers Identified   Patient Goals and CMS Choice        Expected Discharge Plan and Services Expected Discharge Plan: [place]   Discharge Planning Services: CM Consult   Living arrangements for the past 2 months: Single Family Home Expected Discharge Date: 09/17/19                           [facility] Agency: [place]        Prior Living Arrangements/Services Living arrangements for the past 2 months: Single Family Home Lives with:: Adult Children   Do you feel safe going back to the place where you live?: Yes          Current home services: DME, Home OT, Home PT    Activities of Daily Living Home Assistive Devices/Equipment: Environmental consultant (specify type) ADL Screening (condition at time of admission) Patient's cognitive ability adequate to safely complete daily activities?: Yes Is the patient deaf or have difficulty hearing?: No Does the patient have difficulty seeing, even  when wearing glasses/contacts?: No Does the patient have difficulty concentrating, remembering, or making decisions?: Yes Patient able to express need for assistance with ADLs?: Yes Does the patient have difficulty dressing or bathing?: Yes Independently performs ADLs?: No Communication: Independent Dressing (OT): Needs assistance Is this a change from baseline?: Pre-admission baseline Grooming: Needs assistance Is this a change from baseline?: Pre-admission baseline Feeding: Needs assistance Is this a change from baseline?: Pre-admission baseline Bathing: Needs assistance Is this a change from baseline?: Pre-admission baseline Toileting: Needs assistance Is this a change from baseline?: Pre-admission baseline In/Out Bed: Needs assistance Is this a change from baseline?: Pre-admission baseline Walks in Home: Needs assistance Is this a change from baseline?: Pre-admission baseline Does the patient have difficulty walking or climbing stairs?: Yes Weakness of Legs: Both Weakness of Arms/Hands: Right  Permission Sought/Granted                  Emotional Assessment              Admission diagnosis:  Epistaxis [R04.0] Right-sided epistaxis [R04.0] Patient Active Problem List   Diagnosis Date Noted  . Type II diabetes mellitus with renal manifestations ([place]) 09/16/2019  . Chronic respiratory failure with hypoxia ([place]) 09/16/2019  . Epistaxis 09/16/2019  . Anemia due to blood loss 09/16/2019  . Chronic diastolic CHF (congestive heart failure) ([place])  09/16/2019  . Hyperkalemia 09/16/2019  . Hyponatremia 09/16/2019  . Headache 09/16/2019  . Acute respiratory failure with hypoxia ([place]) 09/08/2019  . Acute on chronic diastolic CHF (congestive heart failure) ([place]) 09/07/2019  . Protein-calorie malnutrition, severe 08/22/2019  . Altered mental status   . DNR (do not resuscitate) discussion   . Palliative care by specialist   . Encephalopathy 08/16/2019  . ESRD (end stage  renal disease) ([place])   . Fluid overload 06/09/2019  . Chest pain 06/09/2019  . Elevated troponin 06/09/2019  . Diabetes mellitus type 2 in nonobese ([place]) 06/09/2019  . Anemia of chronic disease 06/09/2019  . Nausea and vomiting 06/09/2019  . Pulmonary edema 06/09/2019  . ESRD (end stage renal disease) on dialysis ([place]) 04/10/2019  . Acute respiratory failure with hypoxemia ([place]) 03/31/2019  . Acute on chronic diastolic (congestive) heart failure ([place]) 03/24/2019  . Hypertension 03/22/2019  . Anemia in ESRD (end-stage renal disease) ([place]) 03/22/2019  . Proteinuria 03/22/2019  . Chest pain 03/22/2019  . Acute on chronic renal failure ([place]) 03/21/2019   PCP:  Patient, No Pcp Per Pharmacy:   [facility] [place], [address] [place] [address] Phone: [phone] Fax: [place] #[id number] [place], [place] - [place] [address] [place] [address] Phone: [phone] Fax: [phone]     Social Determinants of Health ([place]) Interventions    Readmission Risk Interventions Readmission Risk Prevention Plan 09/05/2019 08/18/2019  Transportation Screening Complete Complete  Medication Review (RN Care Manager) Complete Complete  PCP or Specialist appointment within 3-5 days of discharge - Complete  HRI or Home Care Consult Complete Complete  Palliative Care Screening Complete -  [place] Not Applicable Complete

## 2019-09-17 NOTE — Progress Notes (Signed)
[facility]  ROUNDING NOTE   Subjective:  Patient presents to the hospital with epistaxis from right nares  Doing fair No acute events reported by nursing Patient ate half of her lunch  Objective:  Vital signs in last 24 hours:  Temp:  [98 F (36.7 C)-99.3 F (37.4 C)] 98.2 F (36.8 C) (05/26 1019) Pulse Rate:  [79-101] 83 (05/26 1019) Resp:  [14-24] 20 (05/26 1019) BP: (150-226)/(61-90) 150/64 (05/26 1019) SpO2:  [90 %-100 %] 96 % (05/26 1019) Weight:  [36.6 kg] 36.6 kg (05/25 1241)  Weight change: 0.1 kg Filed Weights   09/16/19 0630 09/16/19 1241  Weight: 36.5 kg 36.6 kg    Intake/Output: I/O last 3 completed shifts: In: 500 [Blood:500] Out: 0    Intake/Output this shift:  No intake/output data recorded.  Physical Exam: General:  Chronically ill-appearing, thin, cachectic  Head: Normocephalic, atraumatic. Moist oral mucosal membranes, right nasal packing  Eyes: Anicteric,   Neck: Supple,   Lungs:   Normal breathing effort, mild crackles  Heart: Regular rate and rhythm  Abdomen:  Soft, nontender,   Extremities: trace peripheral edema.  Neurologic: Nonfocal, able to follow commands  Skin: No lesions  Access: Right arm AVF RIJ permcath    Basic Metabolic Panel: Recent Labs  Lab 09/16/19 0632 09/17/19 0455  NA 126* 137  K 5.3* 3.7  CL 90* 98  CO2 26 29  GLUCOSE 127* 96  BUN 44* 15  CREATININE 4.33* 1.91*  CALCIUM 7.5* 7.7*    Liver Function Tests: Recent Labs  Lab 09/16/19 0632  AST 24  ALT 12  ALKPHOS 78  BILITOT 0.9  PROT 5.5*  ALBUMIN 2.5*   No results for input(s): LIPASE, AMYLASE in the last 168 hours. No results for input(s): AMMONIA in the last 168 hours.  CBC: Recent Labs  Lab 09/16/19 0703 09/16/19 2007 09/17/19 0455  WBC 9.0  --  8.0  NEUTROABS 7.4  --   --   HGB 5.9* 8.2* 7.8*  HCT 18.3* 23.5* 22.6*  MCV 84.7  --  82.2  PLT 269  --  203    Cardiac Enzymes: No results for input(s): CKTOTAL, CKMB,  CKMBINDEX, TROPONINI in the last 168 hours.  BNP: Invalid input(s): POCBNP  CBG: Recent Labs  Lab 09/16/19 1811 09/16/19 2048 09/17/19 0025 09/17/19 0444 09/17/19 0744  GLUCAP 72 118* 122* 86 70    Microbiology: Results for orders placed or performed during the hospital encounter of 09/16/19  SARS Coronavirus 2 by RT PCR (hospital order, performed in [facility] hospital lab) Nasopharyngeal Nasopharyngeal Swab     Status: None   Collection Time: 09/16/19  9:58 AM   Specimen: Nasopharyngeal Swab  Result Value Ref Range Status   SARS Coronavirus 2 NEGATIVE NEGATIVE Final    Comment: (NOTE) SARS-CoV-2 target nucleic acids are NOT DETECTED. The SARS-CoV-2 RNA is generally detectable in upper and lower respiratory specimens during the acute phase of infection. The lowest concentration of SARS-CoV-2 viral copies this assay can detect is 250 copies / mL. A negative result does not preclude SARS-CoV-2 infection and should not be used as the sole basis for treatment or other patient management decisions.  A negative result may occur with improper specimen collection / handling, submission of specimen other than nasopharyngeal swab, presence of viral mutation(s) within the areas targeted by this assay, and inadequate number of viral copies (<250 copies / mL). A negative result must be combined with clinical observations, patient history, and epidemiological information.  Fact Sheet for Patients:   StrictlyIdeas.no Fact Sheet for Healthcare Providers: BankingDealers.co.za This test is not yet approved or cleared  by the [place] FDA and has been authorized for detection and/or diagnosis of SARS-CoV-2 by FDA under an Emergency Use Authorization (EUA).  This EUA will remain in effect (meaning this test can be used) for the duration of the COVID-19 declaration under Section 564(b)(1) of the Act, 21 U.S.C. section 360bbb-3(b)(1), unless  the authorization is terminated or revoked sooner. Performed at [facility], [address] [place],  [address]     Coagulation Studies: Recent Labs    09/16/19 6073  LABPROT 14.2  INR 1.1    Urinalysis: No results for input(s): COLORURINE, LABSPEC, PHURINE, GLUCOSEU, HGBUR, BILIRUBINUR, KETONESUR, PROTEINUR, UROBILINOGEN, NITRITE, LEUKOCYTESUR in the last 72 hours.  Invalid input(s): APPERANCEUR    Imaging: CT HEAD WO CONTRAST  Result Date: 09/16/2019 CLINICAL DATA:  Headaches and epistaxis EXAM: CT HEAD WITHOUT CONTRAST TECHNIQUE: Contiguous axial images were obtained from the base of the skull through the vertex without intravenous contrast. COMPARISON:  08/16/2019 FINDINGS: Brain: Scattered atrophic changes and chronic white matter ischemic change is seen. The overall appearance is stable from the prior study. No findings to suggest acute hemorrhage, acute infarction or space-occupying mass lesion are seen. Vascular: No hyperdense vessel or unexpected calcification. Skull: Normal. Negative for fracture or focal lesion. Sinuses/Orbits: No acute finding. Other: None. IMPRESSION: Chronic atrophic and ischemic changes without acute abnormality. Electronically Signed   By: Inez Catalina M.D.   On: 09/16/2019 10:29   DG Chest Port 1 View  Result Date: 09/16/2019 CLINICAL DATA:  Epistaxis, respiratory distress EXAM: PORTABLE CHEST 1 VIEW COMPARISON:  Radiograph 09/07/2019 FINDINGS: Tunneled right IJ approach dual lumen dialysis catheter tip terminates in similar position near the expected level of the right atrium. There is increasing bilateral pleural effusions, right greater than left with adjacent areas of mixed heterogeneous opacity likely reflecting a combination of volume loss and airspace disease, possibly alveolar edema and/or infectious consolidation. No visible pneumothorax. Suspect cardiomegaly though portions of the heart borders are obscured by opacity.  Calcified tortuous aorta is similar to prior. No acute osseous or soft tissue abnormality. Degenerative changes are present in the imaged spine and shoulders. Telemetry leads overlie the chest. IMPRESSION: Increasing bilateral pleural effusions, right greater than left with adjacent areas of mixed heterogeneous opacity likely reflecting a combination of volume loss and airspace disease, possibly alveolar edema and/or infectious consolidation. Electronically Signed   By: Lovena Le M.D.   On: 09/16/2019 06:56     Medications:    . sodium chloride   Intravenous Once  . amLODipine  5 mg Oral Daily  . carvedilol  6.25 mg Oral BID WC  . Chlorhexidine Gluconate Cloth  6 each Topical Q0600  . [START ON 09/16/2019] epoetin (EPOGEN/PROCRIT) injection  4,000 Units Intravenous Q T,Th,Sa-HD  . feeding supplement (GLUCERNA SHAKE)  237 mL Oral TID BM  . feeding supplement (NEPRO CARB STEADY)  237 mL Oral TID BM  . furosemide  80 mg Oral QODAY  . labetalol  10 mg Intravenous Once   acetaminophen, albuterol, dextromethorphan-guaiFENesin, hydrALAZINE, ondansetron (ZOFRAN) IV  Assessment/ Plan:  [name] is a 69 y.o. Hispanic female with end stage renal disease on hemodialysis, diabetes mellitus, hypertension who was admitted today, 09/16/2019 , for Epistaxis [R04.0] Right-sided epistaxis [R04.0]   [facility] TTS [facility] RIJ permcath  #Severe anemia caused by blood loss from epistaxis and  anemia of chronic kidney disease Patient received blood transfusion during dialysis yesterday Schedule Epogen with hemodialysis  #End Stage renal disease with mild hyperkalemia We will arrange for hemodialysis on her usual schedule while she is in the hospital Potassium in normal range today Electrolytes and volume status are acceptable.  No acute indication for dialysis today     LOS: 0 Amber Dixon 5/26/202111:14 AM

## 2019-09-17 NOTE — Plan of Care (Signed)
The patient has been discharged. IV removed. No falls. Daughter in law has brought portable oxygen tank for this patient as she uses 2L of oxygen chronic. Discharged education has been completed with the patients daughter in law.  Problem: Education: Goal: Knowledge of General Education information will improve Description: Including pain rating scale, medication(s)/side effects and non-pharmacologic comfort measures Outcome: Completed/Met   Problem: Health Behavior/Discharge Planning: Goal: Ability to manage health-related needs will improve Outcome: Completed/Met   Problem: Clinical Measurements: Goal: Ability to maintain clinical measurements within normal limits will improve Outcome: Completed/Met Goal: Will remain free from infection Outcome: Completed/Met Goal: Diagnostic test results will improve Outcome: Completed/Met Goal: Respiratory complications will improve Outcome: Completed/Met Goal: Cardiovascular complication will be avoided Outcome: Completed/Met   Problem: Activity: Goal: Risk for activity intolerance will decrease Outcome: Completed/Met   Problem: Nutrition: Goal: Adequate nutrition will be maintained Outcome: Completed/Met   Problem: Coping: Goal: Level of anxiety will decrease Outcome: Completed/Met   Problem: Elimination: Goal: Will not experience complications related to bowel motility Outcome: Completed/Met Goal: Will not experience complications related to urinary retention Outcome: Completed/Met   Problem: Pain Managment: Goal: General experience of comfort will improve Outcome: Completed/Met   Problem: Safety: Goal: Ability to remain free from injury will improve Outcome: Completed/Met   Problem: Skin Integrity: Goal: Risk for impaired skin integrity will decrease Outcome: Completed/Met

## 2019-09-17 NOTE — Progress Notes (Signed)
   09/16/19 1751  Vitals  Temp 98.7 F (37.1 C)  Temp Source Oral  BP (!) 212/90  MAP (mmHg) 114  BP Location Left Arm  BP Method Automatic  Patient Position (if appropriate) Lying  Pulse Rate 92  Pulse Rate Source Monitor  Resp 16  Oxygen Therapy  SpO2 93 %  O2 Device Nasal Cannula  O2 Flow Rate (L/min) 2 L/min  MEWS Score  MEWS Temp 0  MEWS Systolic 2  MEWS Pulse 0  MEWS RR 0  MEWS LOC 0  MEWS Score 2  MEWS Score Color Yellow

## 2019-09-17 NOTE — Discharge Summary (Signed)
Physician Discharge Summary   Amber Dixon  female DOB: 09/10/1950  EHU:[id number]  PCP: Patient, No Pcp Per  Admit date: 09/16/2019 Discharge date: 09/17/2019  Admitted From: home Disposition:  Home Daughter updated on the phone prior to discharge.  Home Health: Yes CODE STATUS: DNR   Hospital Course:  For full details, please see H&P, progress notes, consult notes and ancillary notes.  Briefly,  Amber Dixon is a 69 y.o. Hispanic female with medical history significant of ESRD-HD (TTS), hypertension, diabetes mellitus, chronic respiratory failure on 2 L nasal cannula oxygen, CHF, anemia, who presented with nose bleeding.  Epistaxis and anemia due to blood loss in the setting of anemia in ESRD Nose bleeding from the right nostril stopped after packing in ED. Hgb dropped from 8.0 -->5.9.  Pt was transfused 1u pRBC with Hgb rise to 8.2.  Nose packing was removed by nursing prior to discharge, and no further bleeding was seen.  ESRD (end stage renal disease) on dialysis (TTS) iHD per nephrology.  Type II diabetes mellitus with renal manifestations ([place]):  Most recent A1c 5.1.  Patient was taking NPH 70/30 insulin at home.  Pt's BG had been normal or in low 100's during hospitalization without any insulin, so home NPH 70/30 insulin was discontinued at discharge to avoid hypoglycemia.  Chronic respiratory failure with hypoxia (HCC) on 2L at baseline, stable  Chronic diastolic CHF (congestive heart failure) ([place]): 2D echo on 03/17/2019 showed EF of 55-60%.  Patient did not have leg edema.  CHF seem to be compensated.  Volume management per renal by dialysis.  Hyperkalemia and Hyponatremia:  Corrected by dialysis.  Headache, POA, resolved Pt had severe throbbing whole headache.  No focal neuro deficit on physical examination.  CT head negative for acute intracranial abnormalities.     Discharge Diagnoses:  Principal Problem:    Epistaxis Active Problems:   Anemia in ESRD (end-stage renal disease) (HCC)   ESRD (end stage renal disease) on dialysis (HCC)   Type II diabetes mellitus with renal manifestations (HCC)   Chronic respiratory failure with hypoxia (HCC)   Anemia due to blood loss   Chronic diastolic CHF (congestive heart failure) (HCC)   Hyperkalemia   Hyponatremia   Headache    Discharge Instructions:  Allergies as of 09/17/2019   No Known Allergies     Medication List    STOP taking these medications   insulin NPH-regular Human (70-30) 100 UNIT/ML injection     TAKE these medications   acetaminophen 325 MG tablet Commonly known as: TYLENOL Take 650 mg by mouth every 6 (six) hours as needed for mild pain or fever.   amLODipine 5 MG tablet Commonly known as: NORVASC Take 1 tablet (5 mg total) by mouth daily.   carvedilol 6.25 MG tablet Commonly known as: COREG Take 1 tablet (6.25 mg total) by mouth 2 (two) times daily with a meal.   feeding supplement (GLUCERNA SHAKE) Liqd Take 237 mLs by mouth 3 (three) times daily between meals.   furosemide 80 MG tablet Commonly known as: LASIX Take 1 tablet (80 mg total) by mouth every other day.       Follow-up Information    [facility] on 09/24/2019.   Specialty: Cardiology Why: at 10:00am. Enter through the [place] entrance Contact information: [place] [phone]          No Known Allergies  The results of significant diagnostics from this hospitalization (including imaging, microbiology, ancillary and laboratory) are listed below for reference.   Consultations:   Procedures/Studies: EEG  Result Date: 08/20/2019 Alexis Goodell, MD     08/20/2019  4:59 PM ELECTROENCEPHALOGRAM REPORT Patient: Amber Dixon       Room #: KG40N-UU EEG No. ID: [id number] Age: 69 y.o.        Sex: female Requesting  Physician: Mal Misty Report Date:  08/20/2019       Interpreting Physician: Alexis Goodell History: Amber Dixon is an 69 y.o. female with altered mental status Medications: Norvasc, Rocephin, Catapres, Lasix, Insulin Conditions of Recording:  This is a 21 channel routine scalp EEG performed with bipolar and monopolar montages arranged in accordance to the international 10/20 system of electrode placement. One channel was dedicated to EKG recording. The patient is in the poorly responsive state. Description:  The background activity is slow and poorly organized diffusely with frequent, high voltage, generalized, intermittent periodic discharges of triphasic morphology.  These are continuous throughout the recording and occur at a frequency of approximately 2-3 Hz.  There is no change in the background rhythm with stimulation.  Hyperventilation and intermittent photic stimulation were not performed. IMPRESSION: This is a markedly abnormal electroencephalogram due to general background slowing and frequent, generalized triphasic waves.  This finding is consistent with a severe encephalopathic state, etiology nonspecific. Alexis Goodell, MD Neurology [phone] 08/20/2019, 4:02 PM   DG Chest 1 View  Result Date: 08/31/2019 CLINICAL DATA:  69 year old female with chest and abdominal pain. EXAM: CHEST  1 VIEW; ABDOMEN - 1 VIEW COMPARISON:  Radiograph dated 08/19/2019. FINDINGS: Right-sided dialysis catheter with tip over right atrium. Bilateral confluent airspace opacities, new since the prior radiograph may represent edema but concerning for pneumonia. Clinical correlation recommended. No pleural effusion pneumothorax. Stable cardiomegaly. There is no bowel dilatation or evidence of obstruction. No free air or radiopaque calculi. Osteopenia. No acute osseous pathology. Partially visualized right femoral fixation hardware. IMPRESSION: 1. Bilateral confluent airspace opacities may represent edema but  concerning for pneumonia. Clinical correlation is recommended. 2. No bowel obstruction. Electronically Signed   By: Anner Crete M.D.   On: 08/31/2019 23:31   DG Abd 1 View  Result Date: 09/04/2019 CLINICAL DATA:  Abdominal distension. EXAM: ABDOMEN - 1 VIEW COMPARISON:  Sep 01, 2019. FINDINGS: The bowel gas pattern is normal. No radio-opaque calculi or other significant radiographic abnormality are seen. IMPRESSION: Negative. Electronically Signed   By: Marijo Conception M.D.   On: 09/04/2019 18:43   DG Abd 1 View  Result Date: 08/31/2019 CLINICAL DATA:  69 year old female with chest and abdominal pain. EXAM: CHEST  1 VIEW; ABDOMEN - 1 VIEW COMPARISON:  Radiograph dated 08/19/2019. FINDINGS: Right-sided dialysis catheter with tip over right atrium. Bilateral confluent airspace opacities, new since the prior radiograph may represent edema but concerning for pneumonia. Clinical correlation recommended. No pleural effusion pneumothorax. Stable cardiomegaly. There is no bowel dilatation or evidence of obstruction. No free air or radiopaque calculi. Osteopenia. No acute osseous pathology. Partially visualized right femoral fixation hardware. IMPRESSION: 1. Bilateral confluent airspace opacities may represent edema but concerning for pneumonia. Clinical correlation is recommended. 2. No bowel obstruction. Electronically Signed   By: Anner Crete M.D.   On: 08/31/2019 23:31   CT HEAD WO CONTRAST  Result Date: 09/16/2019 CLINICAL DATA:  Headaches and epistaxis EXAM: CT HEAD WITHOUT CONTRAST TECHNIQUE: Contiguous axial images were obtained from the base of the  skull through the vertex without intravenous contrast. COMPARISON:  08/16/2019 FINDINGS: Brain: Scattered atrophic changes and chronic white matter ischemic change is seen. The overall appearance is stable from the prior study. No findings to suggest acute hemorrhage, acute infarction or space-occupying mass lesion are seen. Vascular: No hyperdense  vessel or unexpected calcification. Skull: Normal. Negative for fracture or focal lesion. Sinuses/Orbits: No acute finding. Other: None. IMPRESSION: Chronic atrophic and ischemic changes without acute abnormality. Electronically Signed   By: Inez Catalina M.D.   On: 09/16/2019 10:29   MR BRAIN WO CONTRAST  Result Date: 08/19/2019 CLINICAL DATA:  In stage renal disease, diabetes and hypertension. Altered mental status. EXAM: MRI HEAD WITHOUT CONTRAST TECHNIQUE: Multiplanar, multiecho pulse sequences of the brain and surrounding structures were obtained without intravenous contrast. COMPARISON:  Head CT 3 days ago FINDINGS: Brain: Diffusion imaging does not show any acute or subacute infarction. No focal abnormality affects the brainstem or cerebellum. Cerebral hemispheres show old small vessel infarction in the left basal ganglia and mild to moderate chronic small-vessel ischemic changes of the cerebral hemispheric white matter. There is an old right parietal cortical and subcortical infarction. No mass lesion, hemorrhage, hydrocephalus or extra-axial collection. Vascular: Major vessels at the base of the brain show flow. Skull and upper cervical spine: Negative Sinuses/Orbits: Mild mucosal inflammatory changes of the sinuses. Bilateral mastoid effusions. Orbits negative. Other: None IMPRESSION: No acute finding. 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 Moderate chronic small-vessel ischemic changes of the hemispheric white matter. Electronically Signed   By: Nelson Chimes M.D.   On: 08/19/2019 17:08   DG Chest Port 1 View  Result Date: 09/16/2019 CLINICAL DATA:  Epistaxis, respiratory distress EXAM: PORTABLE CHEST 1 VIEW COMPARISON:  Radiograph 09/07/2019 FINDINGS: Tunneled right IJ approach dual lumen dialysis catheter tip terminates in similar position near the expected level of the right atrium. There is increasing bilateral pleural effusions, right greater  than left with adjacent areas of mixed heterogeneous opacity likely reflecting a combination of volume loss and airspace disease, possibly alveolar edema and/or infectious consolidation. No visible pneumothorax. Suspect cardiomegaly though portions of the heart borders are obscured by opacity. Calcified tortuous aorta is similar to prior. No acute osseous or soft tissue abnormality. Degenerative changes are present in the imaged spine and shoulders. Telemetry leads overlie the chest. IMPRESSION: Increasing bilateral pleural effusions, right greater than left with adjacent areas of mixed heterogeneous opacity likely reflecting a combination of volume loss and airspace disease, possibly alveolar edema and/or infectious consolidation. Electronically Signed   By: Lovena Le M.D.   On: 09/16/2019 06:56   DG Chest Portable 1 View  Result Date: 09/07/2019 CLINICAL DATA:  Pt missed her dialysis yesterday, was discharged from the hospital Friday, daughter reports she has been labored to breathe since Friday, pt is breathing 48 times a minute with a room air sat of 81%, pt also c/o left sided chest pain. PT placed on BIPAP. [facility] but discharged Friday. Hx - DM, ESRD, HTN, AV fistula placement 04/24/2019, non-smoker. COVID test pending but was negative 08/16/2019 upon last admission. EXAM: PORTABLE CHEST 1 VIEW COMPARISON:  09/01/2019 and earlier exams. FINDINGS: Bilateral airspace opacities, that are most confluent in the lower lungs. Hemidiaphragms are obscured consistent with moderate bilateral effusions. Cardiac silhouette is enlarged. No pneumothorax. Right internal jugular tunneled dual lumen central venous catheter is stable. IMPRESSION: 1. Findings consistent with congestive heart failure/fluid overload with pulmonary  edema and moderate bilateral effusions. Electronically Signed   By: Lajean Manes M.D.   On: 09/07/2019 09:59   DG Chest Port 1 View  Result Date:  09/01/2019 CLINICAL DATA:  Hypoxia EXAM: PORTABLE CHEST 1 VIEW COMPARISON:  08/31/2019, 08/29/2019 FINDINGS: Right-sided central venous catheter tip over the right atrium. Cardiomegaly with extensive bilateral airspace opacities progressed compared to prior radiograph. Increased right pleural effusion. No pneumothorax. IMPRESSION: Cardiomegaly with interval progression of extensive bilateral airspace opacities, edema versus diffuse pneumonia. Increased right-sided pleural effusion. Electronically Signed   By: Donavan Foil M.D.   On: 09/01/2019 21:23   DG Chest Port 1 View  Result Date: 08/29/2019 CLINICAL DATA:  Fever. EXAM: PORTABLE CHEST 1 VIEW COMPARISON:  08/16/2019. FINDINGS: Right-sided dual-lumen catheter in unchanged position. Stable cardiomegaly. Mild bilateral interstitial prominence again noted. Component these changes may be chronic however an active interstitial process including pneumonitis and or interstitial edema cannot be excluded. No pleural effusion or pneumothorax. IMPRESSION: 1.  Right-sided dual-lumen catheter in unchanged position. 2. Stable cardiomegaly. 3 mild bilateral interstitial prominence again noted. A component these changes may be chronic however an active interstitial process including pneumonitis and or interstitial edema cannot be excluded. Similar finding noted on prior exam. Electronically Signed   By: Marcello Moores  Register   On: 08/29/2019 09:58   DG Abd Portable 1V  Result Date: 09/01/2019 CLINICAL DATA:  Abdominal pain, possible constipation EXAM: PORTABLE ABDOMEN - 1 VIEW COMPARISON:  08/31/2019 FINDINGS: Scattered large and small bowel gas is noted similar to that seen on the previous day. No obstructive changes are noted. No findings of constipation are seen. No free air is noted. Stable changes in the lung bases are again seen. No acute bony abnormality is noted. Postsurgical changes in the right hip are seen. IMPRESSION: No acute abnormality in the abdomen. The  overall appearance is similar to that seen on the previous day. Electronically Signed   By: Inez Catalina M.D.   On: 09/01/2019 18:37   DG Abd Portable 1V  Result Date: 08/19/2019 CLINICAL DATA:  NG tube placement. EXAM: PORTABLE ABDOMEN - 1 VIEW COMPARISON:  None. FINDINGS: Tip and side port of the enteric tube below the diaphragm in the stomach. Right-sided dialysis catheter is partially included. Heart is enlarged. Normal bowel gas pattern in the upper abdomen. IMPRESSION: Tip and side port of the enteric tube below the diaphragm in the stomach. Electronically Signed   By: Keith Rake M.D.   On: 08/19/2019 18:45      Labs: BNP (last 3 results) Recent Labs    08/19/19 0406 09/07/19 0926 09/16/19 0703  BNP 2,752.0* 1,353.0* 4,010.2*   Basic Metabolic Panel: Recent Labs  Lab 09/16/19 0632 09/17/19 0455  NA 126* 137  K 5.3* 3.7  CL 90* 98  CO2 26 29  GLUCOSE 127* 96  BUN 44* 15  CREATININE 4.33* 1.91*  CALCIUM 7.5* 7.7*   Liver Function Tests: Recent Labs  Lab 09/16/19 0632  AST 24  ALT 12  ALKPHOS 78  BILITOT 0.9  PROT 5.5*  ALBUMIN 2.5*   No results for input(s): LIPASE, AMYLASE in the last 168 hours. No results for input(s): AMMONIA in the last 168 hours. CBC: Recent Labs  Lab 09/16/19 0703 09/16/19 2007 09/17/19 0455  WBC 9.0  --  8.0  NEUTROABS 7.4  --   --   HGB 5.9* 8.2* 7.8*  HCT 18.3* 23.5* 22.6*  MCV 84.7  --  82.2  PLT 269  --  203   Cardiac Enzymes: No results for input(s): CKTOTAL, CKMB, CKMBINDEX, TROPONINI in the last 168 hours. BNP: Invalid input(s): POCBNP CBG: Recent Labs  Lab 09/16/19 1811 09/16/19 2048 09/17/19 0025 09/17/19 0444 09/17/19 0744  GLUCAP 72 118* 122* 86 73   D-Dimer No results for input(s): DDIMER in the last 72 hours. Hgb A1c No results for input(s): HGBA1C in the last 72 hours. Lipid Profile No results for input(s): CHOL, HDL, LDLCALC, TRIG, CHOLHDL, LDLDIRECT in the last 72 hours. Thyroid function  studies No results for input(s): TSH, T4TOTAL, T3FREE, THYROIDAB in the last 72 hours.  Invalid input(s): FREET3 Anemia work up No results for input(s): VITAMINB12, FOLATE, FERRITIN, TIBC, IRON, RETICCTPCT in the last 72 hours. Urinalysis    Component Value Date/Time   COLORURINE YELLOW (A) 09/01/2019 0442   APPEARANCEUR HAZY (A) 09/01/2019 0442   LABSPEC 1.015 09/01/2019 0442   PHURINE 9.0 (H) 09/01/2019 0442   GLUCOSEU >=500 (A) 09/01/2019 0442   HGBUR NEGATIVE 09/01/2019 0442   BILIRUBINUR NEGATIVE 09/01/2019 0442   KETONESUR NEGATIVE 09/01/2019 0442   PROTEINUR >=300 (A) 09/01/2019 0442   NITRITE NEGATIVE 09/01/2019 0442   LEUKOCYTESUR NEGATIVE 09/01/2019 0442   Sepsis Labs Invalid input(s): PROCALCITONIN,  WBC,  LACTICIDVEN Microbiology Recent Results (from the past 240 hour(s))  SARS Coronavirus 2 by RT PCR (hospital order, performed in [place] hospital lab) Nasopharyngeal Nasopharyngeal Swab     Status: None   Collection Time: 09/16/19  9:58 AM   Specimen: Nasopharyngeal Swab  Result Value Ref Range Status   SARS Coronavirus 2 NEGATIVE NEGATIVE Final    Comment: (NOTE) SARS-CoV-2 target nucleic acids are NOT DETECTED. The SARS-CoV-2 RNA is generally detectable in upper and lower respiratory specimens during the acute phase of infection. The lowest concentration of SARS-CoV-2 viral copies this assay can detect is 250 copies / mL. A negative result does not preclude SARS-CoV-2 infection and should not be used as the sole basis for treatment or other patient management decisions.  A negative result may occur with improper specimen collection / handling, submission of specimen other than nasopharyngeal swab, presence of viral mutation(s) within the areas targeted by this assay, and inadequate number of viral copies (<250 copies / mL). A negative result must be combined with clinical observations, patient history, and epidemiological information. Fact Sheet for  Patients:   StrictlyIdeas.no Fact Sheet for Healthcare Providers: BankingDealers.co.za This test is not yet approved or cleared  by the [place] FDA and has been authorized for detection and/or diagnosis of SARS-CoV-2 by FDA under an Emergency Use Authorization (EUA).  This EUA will remain in effect (meaning this test can be used) for the duration of the COVID-19 declaration under Section 564(b)(1) of the Act, 21 U.S.C. section 360bbb-3(b)(1), unless the authorization is terminated or revoked sooner. Performed at [facility], [address] [place], [place] [address]      Total time spend on discharging this patient, including the last patient exam, discussing the hospital stay, instructions for ongoing care as it relates to all pertinent caregivers, as well as preparing the medical discharge records, prescriptions, and/or referrals as applicable, is 45 minutes.    Enzo Bi, MD  [facility] 09/17/2019, 11:03 AM  If 7PM-7AM, please contact night-coverage

## 2019-09-17 NOTE — Progress Notes (Signed)
   09/16/19 1802  Vitals  Cardiac Rhythm NSR  Level of Consciousness  Level of Consciousness Alert  Pain Assessment  Pain Scale 0-10  Pain Score 0  PCA/Epidural/Spinal Assessment  Respiratory Pattern Regular  Glasgow Coma Scale  Eye Opening 4  Best Verbal Response (NON-intubated) 5  Best Motor Response 6  Glasgow Coma Scale Score 15  MEWS Score  MEWS Temp 0  MEWS Systolic 2  MEWS Pulse 0  MEWS RR 0  MEWS LOC 0  MEWS Score 2  MEWS Score Color Yellow  Provider Notification  Provider Name/Title MD  Date Provider Notified 09/16/19  Time Provider Notified 0093  Notification Type Page  Notification Reason Other (Comment)  Response No new orders  Date of Provider Response 09/16/19  Time of Provider Response 1820

## 2019-09-17 NOTE — Evaluation (Signed)
Clinical/Bedside Swallow Evaluation Patient Details  Name: Amber Dixon MRN: [id number] Date of Birth: 16-Jun-1950  Today's Date: 09/17/2019 Time: SLP Start Time (ACUTE ONLY): 0930 SLP Stop Time (ACUTE ONLY): 1020 SLP Time Calculation (min) (ACUTE ONLY): 50 min  Past Medical History:  Past Medical History:  Diagnosis Date  . Diabetes mellitus without complication ([place])   . Hypertension   . Renal disorder    Past Surgical History:  Past Surgical History:  Procedure Laterality Date  . AV FISTULA PLACEMENT Right 04/24/2019   Procedure: ARTERIOVENOUS (AV) FISTULA CREATION RIGHT ARM;  Surgeon: Waynetta Sandy, MD;  Location: [place];  Service: Vascular;  Laterality: Right;  . AV FISTULA PLACEMENT Right   . HIP FRACTURE SURGERY Right    in [place]  . IR FLUORO GUIDE CV LINE RIGHT  04/02/2019  . IR FLUORO GUIDE CV LINE RIGHT  04/13/2019  . IR US GUIDE VASC ACCESS RIGHT  04/02/2019  . LEG SURGERY     HPI:  Pt is a 69 y.o. female who presents to the ED from home with a chief complaint of nosebleed.  Patient has a history of ESRD on HD T/TH/SAT who was recently discharged from the hospital, admitted for pulmonary edema.  Reports right-sided nosebleed since 3 AM.  Son states patient swallowed a lot of blood and is now actively vomiting bright red blood with clots.  No history of nosebleed.  Denies anticoagulation.  Patient is chronically on 2 L nasal cannula oxygen and presents to the treatment room with room air saturation 83% with brisk right epistaxis and spitting up blood clots.  ED physician did posterior Rhino rocket packing and her nose bleeding stopped in ED. patient had oxygen desaturation to 86% at arrival, which improved to 98% on home level 2 L nasal cannula oxygen currently.  No facial droop or slurred speech. No vision change or hearing loss.  Pt has had recent admissions; April and May.  Chest x-ray showed findings consistent with congestive heart failure/fluid  overload with pulmonary edema and moderate bilateral effusions.  Pt was admitted w/ dx of Epistaxis and anemia due to blood loss in the setting of anemia in ESRD.    Assessment / Plan / Recommendation Clinical Impression  Pt seen for BSE today; admitted w/ dx of Epistaxis and anemia due to blood loss in the setting of anemia in ESRD. Pt is Spanish; Interpreter present in room. Pt is awake and engages easily in verbal communication w/ others, follows commands w/ min cues. She appears to present w/ adequate oropharyngeal phase swallowing function w/ no overt clinical s/s of aspiration noted during po trials; no neuromuscular deficits. Pt is Edentulous at Baseline. No decline in presentation or respiratory status during/post po trials; no coughing/throat clearing and vocal quality was clear during/post trials. Oral phase appeared adequate for bolus control/management of trials of thin liquids, and purees w/ NSG(pt swallowed puree w/ NSG w/ Meds but declined further w/ SLP d/t "full" feeling); solids were not assessed d/t pt's Baseline Edentulous status. Pt presents w/ increased weakness overall and requires full positioning upright in bed. OM exam appeared [facility] for lingual/labial movements; no unilateral weakness noted. Recommend continue a pureed diet w/ thin liquids for ease of oral intake(when she returns home, Family can cook/prepare foods pt is used to that are easier for gumming and mashing w/ her Baseline Edentulous status). Recommend general aspiration precautions; Pills in Puree(crushed as needed) for safer, easier swallowing as needed. Recommend thorough oral care and  support w/ feeding at all meals w/ upright positioning and head supported forward. NSG updated, agreed. ST services will sign off at this time as pt appears at her baseline. SLP Visit Diagnosis: Dysphagia, oral phase (R13.11)(Edentulous Baseline)    Aspiration Risk  Risk for inadequate nutrition/hydration    Diet Recommendation  Pureed  diet (dysphagia level 1) w/ Thin liquids; general aspiration precautions; setup and postioning for meals  Medication Administration: Whole meds with puree(vs Crushed in puree)    Other  Recommendations Recommended Consults: (Dietician f/u for support) Oral Care Recommendations: Oral care BID;Oral care before and after PO;Staff/trained caregiver to provide oral care Other Recommendations: (n/a)   Follow up Recommendations None      Frequency and Duration (n/a)  (n/a)       Prognosis Prognosis for Safe Diet Advancement: Fair(-Good) Barriers to Reach Goals: (edentulous)      Swallow Study   General Date of Onset: 09/16/19 HPI: Pt is a 69 y.o. female who presents to the ED from home with a chief complaint of nosebleed.  Patient has a history of ESRD on HD T/TH/SAT who was recently discharged from the hospital, admitted for pulmonary edema.  Reports right-sided nosebleed since 3 AM.  Son states patient swallowed a lot of blood and is now actively vomiting bright red blood with clots.  No history of nosebleed.  Denies anticoagulation.  Patient is chronically on 2 L nasal cannula oxygen and presents to the treatment room with room air saturation 83% with brisk right epistaxis and spitting up blood clots.  ED physician did posterior Rhino rocket packing and her nose bleeding stopped in ED. patient had oxygen desaturation to 86% at arrival, which improved to 98% on home level 2 L nasal cannula oxygen currently.  No facial droop or slurred speech. No vision change or hearing loss.  Pt has had recent admissions; April and May.  Chest x-ray showed findings consistent with congestive heart failure/fluid overload with pulmonary edema and moderate bilateral effusions.  Pt was admitted w/ dx of Epistaxis and anemia due to blood loss in the setting of anemia in ESRD.  Type of Study: Bedside Swallow Evaluation Previous Swallow Assessment: 08/25/2019 Diet Prior to this Study: Thin liquids;Dysphagia 1  (puree)(edentulous at baseline) Temperature Spikes Noted: No(wbc 8.0) Respiratory Status: Nasal cannula(2L) History of Recent Intubation: No Behavior/Cognition: Alert;Cooperative;Pleasant mood;Distractible;Requires cueing(min; Interpreter present) Oral Cavity Assessment: Within Functional Limits Oral Care Completed by SLP: Recent completion by staff Oral Cavity - Dentition: Edentulous Vision: Functional for self-feeding Self-Feeding Abilities: Able to feed self;Needs set up Patient Positioning: Upright in bed(needed min positioning support) Baseline Vocal Quality: Normal(min mumbled speech per Interpreter) Volitional Cough: Strong Volitional Swallow: Able to elicit    Oral/Motor/Sensory Function Overall Oral Motor/Sensory Function: Within functional limits   Ice Chips Ice chips: Not tested   Thin Liquid Thin Liquid: Within functional limits Presentation: Cup;Self Fed;Straw(7-8 trials accepted) Other Comments: encouraged small sips slowly    Nectar Thick Nectar Thick Liquid: Not tested   Honey Thick Honey Thick Liquid: Not tested   Puree Puree: Not tested(but pt swallowed puree w/ NSG w/ meds) Other Comments: pt did not want anymore food/puree d/t feeling "full"   Solid     Solid: Not tested Other Comments: pt edentulous       Orinda Kenner, MS, CCC-SLP [facility]igan Lister 09/17/2019,11:03 AM

## 2019-09-18 ENCOUNTER — Encounter: Payer: Self-pay | Admitting: Emergency Medicine

## 2019-09-18 ENCOUNTER — Inpatient Hospital Stay
Admission: EM | Admit: 2019-09-18 | Discharge: 2019-09-23 | DRG: 291 | Disposition: E | Payer: Medicaid Other | Attending: Pulmonary Disease | Admitting: Pulmonary Disease

## 2019-09-18 ENCOUNTER — Inpatient Hospital Stay (HOSPITAL_COMMUNITY)
Admit: 2019-09-18 | Discharge: 2019-09-18 | Disposition: A | Discharge status: Home / Self Care | Payer: Medicaid Other | Attending: Pulmonary Disease | Admitting: Pulmonary Disease

## 2019-09-18 ENCOUNTER — Emergency Department: Payer: Medicaid Other

## 2019-09-18 ENCOUNTER — Other Ambulatory Visit: Payer: Self-pay

## 2019-09-18 DIAGNOSIS — Z20822 Contact with and (suspected) exposure to covid-19: Secondary | ICD-10-CM | POA: Diagnosis present

## 2019-09-18 DIAGNOSIS — I5033 Acute on chronic diastolic (congestive) heart failure: Secondary | ICD-10-CM | POA: Diagnosis present

## 2019-09-18 DIAGNOSIS — J9621 Acute and chronic respiratory failure with hypoxia: Secondary | ICD-10-CM | POA: Diagnosis present

## 2019-09-18 DIAGNOSIS — I509 Heart failure, unspecified: Secondary | ICD-10-CM

## 2019-09-18 DIAGNOSIS — Z9981 Dependence on supplemental oxygen: Secondary | ICD-10-CM | POA: Diagnosis not present

## 2019-09-18 DIAGNOSIS — Z681 Body mass index (BMI) 19 or less, adult: Secondary | ICD-10-CM | POA: Diagnosis not present

## 2019-09-18 DIAGNOSIS — E43 Unspecified severe protein-calorie malnutrition: Secondary | ICD-10-CM | POA: Diagnosis present

## 2019-09-18 DIAGNOSIS — G9340 Encephalopathy, unspecified: Secondary | ICD-10-CM | POA: Diagnosis not present

## 2019-09-18 DIAGNOSIS — I361 Nonrheumatic tricuspid (valve) insufficiency: Secondary | ICD-10-CM | POA: Diagnosis present

## 2019-09-18 DIAGNOSIS — D62 Acute posthemorrhagic anemia: Secondary | ICD-10-CM | POA: Diagnosis present

## 2019-09-18 DIAGNOSIS — D631 Anemia in chronic kidney disease: Secondary | ICD-10-CM | POA: Diagnosis present

## 2019-09-18 DIAGNOSIS — I272 Pulmonary hypertension, unspecified: Secondary | ICD-10-CM | POA: Diagnosis present

## 2019-09-18 DIAGNOSIS — J9601 Acute respiratory failure with hypoxia: Secondary | ICD-10-CM

## 2019-09-18 DIAGNOSIS — Z66 Do not resuscitate: Secondary | ICD-10-CM | POA: Diagnosis not present

## 2019-09-18 DIAGNOSIS — I132 Hypertensive heart and chronic kidney disease with heart failure and with stage 5 chronic kidney disease, or end stage renal disease: Secondary | ICD-10-CM | POA: Diagnosis present

## 2019-09-18 DIAGNOSIS — K5909 Other constipation: Secondary | ICD-10-CM | POA: Diagnosis present

## 2019-09-18 DIAGNOSIS — J690 Pneumonitis due to inhalation of food and vomit: Secondary | ICD-10-CM | POA: Diagnosis present

## 2019-09-18 DIAGNOSIS — R64 Cachexia: Secondary | ICD-10-CM | POA: Diagnosis present

## 2019-09-18 DIAGNOSIS — N186 End stage renal disease: Secondary | ICD-10-CM | POA: Diagnosis present

## 2019-09-18 DIAGNOSIS — E1122 Type 2 diabetes mellitus with diabetic chronic kidney disease: Secondary | ICD-10-CM | POA: Diagnosis present

## 2019-09-18 DIAGNOSIS — Z515 Encounter for palliative care: Secondary | ICD-10-CM | POA: Diagnosis not present

## 2019-09-18 DIAGNOSIS — Z992 Dependence on renal dialysis: Secondary | ICD-10-CM

## 2019-09-18 DIAGNOSIS — R451 Restlessness and agitation: Secondary | ICD-10-CM | POA: Diagnosis not present

## 2019-09-18 DIAGNOSIS — I161 Hypertensive emergency: Secondary | ICD-10-CM | POA: Diagnosis present

## 2019-09-18 DIAGNOSIS — Z79899 Other long term (current) drug therapy: Secondary | ICD-10-CM | POA: Diagnosis not present

## 2019-09-18 DIAGNOSIS — J810 Acute pulmonary edema: Secondary | ICD-10-CM

## 2019-09-18 DIAGNOSIS — I5031 Acute diastolic (congestive) heart failure: Secondary | ICD-10-CM

## 2019-09-18 LAB — ECHOCARDIOGRAM COMPLETE
Height: 59 in
Weight: 1291.01 oz

## 2019-09-18 LAB — CBC WITH DIFFERENTIAL/PLATELET
Abs Immature Granulocytes: 0.1 10*3/uL — ABNORMAL HIGH (ref 0.00–0.07)
Basophils Absolute: 0.1 10*3/uL (ref 0.0–0.1)
Basophils Relative: 0 %
Eosinophils Absolute: 0.1 10*3/uL (ref 0.0–0.5)
Eosinophils Relative: 0 %
HCT: 23.3 % — ABNORMAL LOW (ref 36.0–46.0)
Hemoglobin: 7.6 g/dL — ABNORMAL LOW (ref 12.0–15.0)
Immature Granulocytes: 1 %
Lymphocytes Relative: 4 %
Lymphs Abs: 0.6 10*3/uL — ABNORMAL LOW (ref 0.7–4.0)
MCH: 28.5 pg (ref 26.0–34.0)
MCHC: 32.6 g/dL (ref 30.0–36.0)
MCV: 87.3 fL (ref 80.0–100.0)
Monocytes Absolute: 1.1 10*3/uL — ABNORMAL HIGH (ref 0.1–1.0)
Monocytes Relative: 7 %
Neutro Abs: 14.3 10*3/uL — ABNORMAL HIGH (ref 1.7–7.7)
Neutrophils Relative %: 88 %
Platelets: 229 10*3/uL (ref 150–400)
RBC: 2.67 MIL/uL — ABNORMAL LOW (ref 3.87–5.11)
RDW: 16 % — ABNORMAL HIGH (ref 11.5–15.5)
WBC: 16.1 10*3/uL — ABNORMAL HIGH (ref 4.0–10.5)
nRBC: 0 % (ref 0.0–0.2)

## 2019-09-18 LAB — HEPATITIS B SURFACE ANTIGEN: Hepatitis B Surface Ag: NONREACTIVE

## 2019-09-18 LAB — PROTIME-INR
INR: 1.1 (ref 0.8–1.2)
Prothrombin Time: 13.8 seconds (ref 11.4–15.2)

## 2019-09-18 LAB — COMPREHENSIVE METABOLIC PANEL
ALT: 17 U/L (ref 0–44)
AST: 24 U/L (ref 15–41)
Albumin: 3.4 g/dL — ABNORMAL LOW (ref 3.5–5.0)
Alkaline Phosphatase: 100 U/L (ref 38–126)
Anion gap: 10 (ref 5–15)
BUN: 27 mg/dL — ABNORMAL HIGH (ref 8–23)
CO2: 30 mmol/L (ref 22–32)
Calcium: 8.1 mg/dL — ABNORMAL LOW (ref 8.9–10.3)
Chloride: 95 mmol/L — ABNORMAL LOW (ref 98–111)
Creatinine, Ser: 3.12 mg/dL — ABNORMAL HIGH (ref 0.44–1.00)
GFR calc Af Amer: 17 mL/min — ABNORMAL LOW (ref >60)
GFR calc non Af Amer: 14 mL/min — ABNORMAL LOW (ref >60)
Glucose, Bld: 156 mg/dL — ABNORMAL HIGH (ref 70–99)
Potassium: 4.6 mmol/L (ref 3.5–5.1)
Sodium: 135 mmol/L (ref 135–145)
Total Bilirubin: 0.7 mg/dL (ref 0.3–1.2)
Total Protein: 6.9 g/dL (ref 6.5–8.1)

## 2019-09-18 LAB — BLOOD GAS, VENOUS
Acid-Base Excess: 6.8 mmol/L — ABNORMAL HIGH (ref 0.0–2.0)
Bicarbonate: 32.3 mmol/L — ABNORMAL HIGH (ref 20.0–28.0)
O2 Saturation: 89.5 %
Patient temperature: 37
pCO2, Ven: 51 mmHg (ref 44.0–60.0)
pH, Ven: 7.41 (ref 7.250–7.430)
pO2, Ven: 57 mmHg — ABNORMAL HIGH (ref 32.0–45.0)

## 2019-09-18 LAB — TROPONIN I (HIGH SENSITIVITY): Troponin I (High Sensitivity): 14 ng/L (ref <18)

## 2019-09-18 LAB — GLUCOSE, CAPILLARY
Glucose-Capillary: 80 mg/dL (ref 70–99)
Glucose-Capillary: 81 mg/dL (ref 70–99)
Glucose-Capillary: 84 mg/dL (ref 70–99)
Glucose-Capillary: 69 mg/dL — ABNORMAL LOW (ref 70–99)

## 2019-09-18 LAB — PROCALCITONIN: Procalcitonin: 0.7 ng/mL

## 2019-09-18 LAB — MAGNESIUM: Magnesium: 2.1 mg/dL (ref 1.7–2.4)

## 2019-09-18 LAB — BRAIN NATRIURETIC PEPTIDE: B Natriuretic Peptide: 3360.1 pg/mL — ABNORMAL HIGH (ref 0.0–100.0)

## 2019-09-18 MED ORDER — CALCIUM GLUCONATE-NACL 1-0.675 GM/50ML-% IV SOLN
1.0000 g | Freq: Once | INTRAVENOUS | Status: AC
  Administered 2019-09-18: 1000 mg via INTRAVENOUS
  Filled 2019-09-18: qty 50

## 2019-09-18 MED ORDER — ACETAMINOPHEN 325 MG PO TABS: 650.0000 mg | ORAL_TABLET | ORAL | Status: DC | PRN

## 2019-09-18 MED ORDER — MORPHINE SULFATE (PF) 2 MG/ML IV SOLN
1.0000 mg | INTRAVENOUS | Status: DC | PRN
  Administered 2019-09-18: 1 mg via INTRAVENOUS
  Filled 2019-09-18 (×2): qty 1

## 2019-09-18 MED ORDER — DEXTROSE 50 % IV SOLN
INTRAVENOUS | Status: AC
  Administered 2019-09-18: 25 mL
  Filled 2019-09-18: qty 50

## 2019-09-18 MED ORDER — NITROGLYCERIN IN D5W 200-5 MCG/ML-% IV SOLN
0.0000 ug/min | INTRAVENOUS | Status: DC
Start: 2019-09-18 — End: 2019-09-20

## 2019-09-18 MED ORDER — INSULIN ASPART 100 UNIT/ML ~~LOC~~ SOLN: 0.0000 [IU] | SUBCUTANEOUS | Status: DC

## 2019-09-18 MED ORDER — POLYETHYLENE GLYCOL 3350 17 G PO PACK: 17.0000 g | PACK | Freq: Every day | ORAL | Status: DC | PRN

## 2019-09-18 MED ORDER — ONDANSETRON HCL 4 MG/2ML IJ SOLN: 4.0000 mg | Freq: Four times a day (QID) | INTRAMUSCULAR | Status: DC | PRN

## 2019-09-18 MED ORDER — FUROSEMIDE 10 MG/ML IJ SOLN
40.0000 mg | Freq: Once | INTRAMUSCULAR | Status: AC
  Administered 2019-09-18: 40 mg via INTRAVENOUS
  Filled 2019-09-18: qty 4

## 2019-09-18 MED ORDER — SODIUM CHLORIDE 0.9 % IV SOLN
1.5000 g | Freq: Two times a day (BID) | INTRAVENOUS | Status: DC
  Administered 2019-09-18: 1.5 g via INTRAVENOUS
  Filled 2019-09-18 (×4): qty 4

## 2019-09-18 MED ORDER — MIDAZOLAM HCL 2 MG/2ML IJ SOLN
INTRAMUSCULAR | Status: AC
  Filled 2019-09-18: qty 2

## 2019-09-18 MED ORDER — HYDRALAZINE HCL 20 MG/ML IJ SOLN
10.0000 mg | INTRAMUSCULAR | Status: DC | PRN
  Administered 2019-09-18: 10 mg via INTRAVENOUS
  Filled 2019-09-18: qty 1

## 2019-09-18 MED ORDER — LORAZEPAM 2 MG/ML IJ SOLN
INTRAMUSCULAR | Status: AC
  Filled 2019-09-18: qty 1

## 2019-09-18 MED ORDER — DOCUSATE SODIUM 100 MG PO CAPS: 100.0000 mg | ORAL_CAPSULE | Freq: Two times a day (BID) | ORAL | Status: DC | PRN

## 2019-09-18 MED ORDER — IPRATROPIUM-ALBUTEROL 0.5-2.5 (3) MG/3ML IN SOLN: 3.0000 mL | RESPIRATORY_TRACT | Status: DC | PRN

## 2019-09-18 MED ORDER — MORPHINE SULFATE (PF) 2 MG/ML IV SOLN
2.0000 mg | INTRAVENOUS | Status: DC | PRN
  Administered 2019-09-18 (×2): 2 mg via INTRAVENOUS
  Filled 2019-09-18: qty 1

## 2019-09-18 MED ORDER — NITROGLYCERIN IN D5W 200-5 MCG/ML-% IV SOLN
INTRAVENOUS | Status: AC
  Administered 2019-09-18: 18 ug/min via INTRAVENOUS
  Filled 2019-09-18: qty 250

## 2019-09-18 MED ORDER — CHLORHEXIDINE GLUCONATE CLOTH 2 % EX PADS
6.0000 | MEDICATED_PAD | Freq: Every day | CUTANEOUS | Status: DC
  Administered 2019-09-18 – 2019-09-20 (×3): 6 via TOPICAL
  Filled 2019-09-18: qty 6

## 2019-09-18 NOTE — Progress Notes (Signed)
PHARMACY CONSULT NOTE - FOLLOW UP  Pharmacy Consult for Electrolyte Monitoring and Replacement   Recent Labs: Potassium (mmol/L)  Date Value  08/23/2019 4.6   Magnesium (mg/dL)  Date Value  09/17/2019 2.1   Calcium (mg/dL)  Date Value  09/22/2019 8.1 (L)   Calcium, Total (PTH) (mg/dL)  Date Value  03/22/2019 6.3   Albumin (g/dL)  Date Value  08/28/2019 3.4 (L)   Phosphorus (mg/dL)  Date Value  09/04/2019 6.7 (H)   Sodium (mmol/L)  Date Value  08/28/2019 135   Assessment: Asked to manage electrolytes per protocol.  Pt w/ ESRD requiring dialysis.  Calcium and Albumin low.  Corrected Calcium ~ 8.6  Na and K WNL  Goal of Therapy:  [place] WNL  Plan:  Ca Gluconate 1gm IV now x 1 F/U labs and adjust per protocol  Ena Dawley ,PharmD Clinical Pharmacist 09/13/2019 6:23 AM

## 2019-09-18 NOTE — Progress Notes (Signed)
RT note:  Trialed off of Bipap to 4L [place].  SpO2 86-88% with noted suprasternal retractions.  Placed back on Bipap.

## 2019-09-18 NOTE — ED Notes (Addendum)
Pt BP remains elevated. Nitro drip currently set at 28 mcg/min, increased dose 39mcg/hr per MD order. Nitro drip now running at 33 mcg/min.

## 2019-09-18 NOTE — Progress Notes (Signed)
HD Tx  Pt cannulated with 15 gauge needles; arterial site with increasing pressures. Pt transitioned to venous 15g and arterial catheter.     08/26/2019 0745  Vital Signs  Pulse Rate 91  Resp (!) 30  Oxygen Therapy  SpO2 100 %  During Hemodialysis Assessment  Blood Flow Rate (mL/min) 400 mL/min  Arterial Pressure (mmHg) -110 mmHg  Venous Pressure (mmHg) 120 mmHg  Transmembrane Pressure (mmHg) 80 mmHg  Ultrafiltration Rate (mL/min) 1000 mL/min  Dialysate Flow Rate (mL/min) 600 ml/min  Conductivity: Machine  14  HD Safety Checks Performed Yes  Intra-Hemodialysis Comments Progressing as prescribed

## 2019-09-18 NOTE — Progress Notes (Signed)
Post HD     09/13/2019 1054  Vital Signs  Temp 99.4 F (37.4 C)  Temp Source Axillary  Pulse Rate 94  Resp 17  BP (!) 165/83  BP Location Left Arm  BP Method Automatic  Patient Position (if appropriate) Lying  Oxygen Therapy  SpO2 100 %  O2 Device Bi-PAP  Pain Assessment  Pain Scale Faces  Faces Pain Scale 4  Post-Hemodialysis Assessment  Rinseback Volume (mL) 250 mL  KECN 68.4 V  Dialyzer Clearance Lightly streaked  Duration of HD Treatment -hour(s) 3 hour(s)  Hemodialysis Intake (mL) 500 mL  UF Total -Machine (mL) 3093 mL  Net UF (mL) 2593 mL  Tolerated HD Treatment Yes  AVG/AVF Arterial Site Held (minutes) 10 minutes  AVG/AVF Venous Site Held (minutes) 10 minutes  Fistula / Graft Right Forearm Arteriovenous fistula  No Placement Date or Time found.   Placed prior to admission: No  Orientation: Right  Access Location: Forearm  Access Type: Arteriovenous fistula  Site Condition No complications  Fistula / Graft Assessment Bruit;Thrill;Present  Status Deaccessed  Drainage Description None  Hemodialysis Catheter Right Subclavian  No Placement Date or Time found.   Placed prior to admission: Yes  Orientation: Right  Access Location: Subclavian  Site Condition No complications  Blue Lumen Status Saline locked  Red Lumen Status Saline locked  Purple Lumen Status N/A  Catheter fill solution Other (Comment)  Catheter fill volume (Arterial) 10 cc  Catheter fill volume (Venous) 10  Dressing Type Occlusive  Dressing Status Dry;Intact  Drainage Description None  Post treatment catheter status Capped and Clamped

## 2019-09-18 NOTE — ED Triage Notes (Signed)
Pt to room 41, respiratory distress; accomp by daughter-in-law who reports diff breathing since last night with lower back pain; dialysis Mon-Tue-Thu-Sat; seen here yesterday for nasal packing due to rt sided epistaxis; O2 sat initially 70's; placed on 100% NRB (pt normally on O2 at 2l/min via [place] at all times at home)

## 2019-09-18 NOTE — ED Provider Notes (Signed)
[facility] Emergency Department Provider Note  ____________________________________________   First MD Initiated Contact with Patient 08/25/2019 [phone]     (approximate)  I have reviewed the triage vital signs and the nursing notes.   HISTORY  Chief Complaint Respiratory Distress  Level 5 caveat:  history/ROS limited by acute/critical illness  HPI Amber Dixon is a 69 y.o. female whose medical history is notable for end-stage renal disease on hemodialysis Tuesdays, Thursdays, and Saturdays.  She presents tonight in severe respiratory distress.  She was hospitalized a couple of days ago after a severe nosebleed leading to volume loss anemia in the setting of chronic anemia.  She was transfused during dialysis and was feeling better.  She was discharged less than 24 hours ago in no respiratory distress.  Her daughter-in-law reports that in the evening she was trying to have a bowel movement and she suffers from chronic constipation.  She spent a long time straining in the bathroom and finally had success but she was feeling short of breath afterwards and starting to sound wheezy.  She went to sleep open she would feel better but then woke in severe distress, unable to lie flat and unable to breathe.  She is on 2 L of oxygen at baseline and she had oxygen saturation in the low 70s.  I was called to her room immediately after her arrival given her severe distress.        Past Medical History:  Diagnosis Date  . Diabetes mellitus without complication ([place])   . Hypertension   . Renal disorder     Patient Active Problem List   Diagnosis Date Noted  . Type II diabetes mellitus with renal manifestations ([place]) 09/16/2019  . Chronic respiratory failure with hypoxia ([place]) 09/16/2019  . Epistaxis 09/16/2019  . Anemia due to blood loss 09/16/2019  . Chronic diastolic CHF (congestive heart failure) ([place]) 09/16/2019  . Hyperkalemia 09/16/2019  .  Hyponatremia 09/16/2019  . Headache 09/16/2019  . Acute respiratory failure with hypoxia ([place]) 09/08/2019  . Acute on chronic diastolic CHF (congestive heart failure) ([place]) 09/07/2019  . Protein-calorie malnutrition, severe 08/22/2019  . Altered mental status   . DNR (do not resuscitate) discussion   . Palliative care by specialist   . Encephalopathy 08/16/2019  . ESRD (end stage renal disease) ([place])   . Fluid overload 06/09/2019  . Chest pain 06/09/2019  . Elevated troponin 06/09/2019  . Diabetes mellitus type 2 in nonobese ([place]) 06/09/2019  . Anemia of chronic disease 06/09/2019  . Nausea and vomiting 06/09/2019  . Pulmonary edema 06/09/2019  . ESRD (end stage renal disease) on dialysis ([place]) 04/10/2019  . Acute respiratory failure with hypoxemia ([place]) 03/31/2019  . Acute on chronic diastolic (congestive) heart failure ([place]) 03/24/2019  . Hypertension 03/22/2019  . Anemia in ESRD (end-stage renal disease) ([place]) 03/22/2019  . Proteinuria 03/22/2019  . Chest pain 03/22/2019  . Acute on chronic renal failure ([place]) 03/21/2019    Past Surgical History:  Procedure Laterality Date  . AV FISTULA PLACEMENT Right 04/24/2019   Procedure: ARTERIOVENOUS (AV) FISTULA CREATION RIGHT ARM;  Surgeon: Waynetta Sandy, MD;  Location: [facility];  Service: Vascular;  Laterality: Right;  . AV FISTULA PLACEMENT Right   . HIP FRACTURE SURGERY Right    in [place]  . IR FLUORO GUIDE CV LINE RIGHT  04/02/2019  . IR FLUORO GUIDE CV LINE RIGHT  04/13/2019  . IR US GUIDE VASC ACCESS RIGHT  04/02/2019  .  LEG SURGERY      Prior to Admission medications   Medication Sig Start Date End Date Taking? Authorizing Provider  acetaminophen (TYLENOL) 325 MG tablet Take 650 mg by mouth every 6 (six) hours as needed for mild pain or fever.    Yes [provider]  amLODipine (NORVASC) 5 MG tablet Take 1 tablet (5 mg total) by mouth daily. 09/06/19  Yes Danford, Suann Larry, MD  carvedilol (COREG) 6.25  MG tablet Take 1 tablet (6.25 mg total) by mouth 2 (two) times daily with a meal. 09/09/19 12/08/19 Yes Enzo Bi, MD  feeding supplement, GLUCERNA SHAKE, (GLUCERNA SHAKE) LIQD Take 237 mLs by mouth 3 (three) times daily between meals. 09/09/19  Yes Enzo Bi, MD  furosemide (LASIX) 80 MG tablet Take 1 tablet (80 mg total) by mouth every other day. Patient not taking: Reported on 09/15/2019 09/06/19   Edwin Dada, MD    Allergies Patient has no known allergies.  No family history on file.  Social History Social History   Tobacco Use  . Smoking status: Never Smoker  . Smokeless tobacco: Never Used  Substance Use Topics  . Alcohol use: Not on file  . Drug use: Not Currently    Review of Systems Level 5 caveat:  history/ROS limited by acute/critical illness   ____________________________________________   PHYSICAL EXAM:  VITAL SIGNS: ED Triage Vitals  Enc Vitals Group     BP 09/12/2019 0411 (!) 183/72     Pulse Rate 09/14/2019 0411 92     Resp 09/10/2019 0411 (!) 32     Temp 08/30/2019 0452 98.8 F (37.1 C)     Temp Source 09/09/2019 0452 Axillary     SpO2 09/05/2019 0411 (!) 77 %     Weight 09/08/2019 0418 36.6 kg (80 lb 11 oz)     Height 09/22/2019 0418 1.499 m (4\' 11" )     Head Circumference --      Peak Flow --      Pain Score --      Pain Loc --      Pain Edu? --      Excl. in [place]? --     Constitutional: Alert and oriented.  Severe respiratory distress.  Small body habitus. Eyes: Conjunctivae are normal.  Head: Atraumatic. Nose: No congestion/rhinnorhea. Mouth/Throat: Patient is wearing a mask. Neck: No stridor.  No meningeal signs.   Cardiovascular: Borderline tachycardia, regular rhythm. Good peripheral circulation.  Respiratory: Severe respiratory distress.  Decreased air movement, coarse breath sounds, wheezing. Gastrointestinal: Soft and nontender. No distention.  Musculoskeletal: No lower extremity tenderness nor edema. No gross deformities of  extremities. Neurologic: Exam limited due to severe respiratory distress, but the patient is moving all 4 extremities and is able to converse with me a few words at a time. Skin:  Skin is warm, dry and intact.   ____________________________________________   LABS (all labs ordered are listed, but only abnormal results are displayed)  Labs Reviewed  COMPREHENSIVE METABOLIC PANEL - Abnormal; Notable for the following components:      Result Value   Chloride 95 (*)    Glucose, Bld 156 (*)    BUN 27 (*)    Creatinine, Ser 3.12 (*)    Calcium 8.1 (*)    Albumin 3.4 (*)    GFR calc non Af Amer 14 (*)    GFR calc Af Amer 17 (*)    All other components within normal limits  CBC WITH DIFFERENTIAL/PLATELET - Abnormal; Notable for  the following components:   WBC 16.1 (*)    RBC 2.67 (*)    Hemoglobin 7.6 (*)    HCT 23.3 (*)    RDW 16.0 (*)    Neutro Abs 14.3 (*)    Lymphs Abs 0.6 (*)    Monocytes Absolute 1.1 (*)    Abs Immature Granulocytes 0.10 (*)    All other components within normal limits  BLOOD GAS, VENOUS - Abnormal; Notable for the following components:   pO2, Ven 57.0 (*)    Bicarbonate 32.3 (*)    Acid-Base Excess 6.8 (*)    All other components within normal limits  BRAIN NATRIURETIC PEPTIDE - Abnormal; Notable for the following components:   B Natriuretic Peptide 3,360.1 (*)    All other components within normal limits  EXPECTORATED SPUTUM ASSESSMENT W REFEX TO RESP CULTURE  MAGNESIUM  PROTIME-INR  PROCALCITONIN  TROPONIN I (HIGH SENSITIVITY)   ____________________________________________  EKG  ED ECG REPORT I, Hinda Kehr, the attending physician, personally viewed and interpreted this ECG.  Date: 08/25/2019 EKG Time: 4:50 AM Rate: 91 Rhythm: Sinus rhythm QRS Axis: Borderline right axis deviation Intervals: Borderline short PR interval ST/T Wave abnormalities: Non-specific ST segment / T-wave changes, but no clear evidence of acute ischemia. Narrative  Interpretation: no definitive evidence of acute ischemia; does not meet STEMI criteria.   ____________________________________________  RADIOLOGY I, Hinda Kehr, personally viewed and evaluated these images (plain radiographs) as part of my medical decision making, as well as reviewing the written report by the radiologist.  ED MD interpretation: Severe volume overload with pulmonary edema and layering pleural effusions, unlikely to represent infectious process by clinical correlation  Official radiology report(s): DG Chest Portable 1 View  Result Date: 09/09/2019 CLINICAL DATA:  Respiratory failure, ESRD on hemodialysis EXAM: PORTABLE CHEST 1 VIEW COMPARISON:  Radiograph 09/16/2019 FINDINGS: Markedly increased bilateral airspace opacities which now extensively involve the upper lungs as well as more gradient opacity towards the bases with at least moderate layering pleural effusions. No visible pneumothorax. Cardiomediastinal contours are largely obscured though the visible contours are unchanged from prior with a calcified tortuous aorta. A right IJ approach Port-A-Cath tip terminates at the approximate level of the right atrium. Telemetry leads overlie the chest. No acute osseous or soft tissue abnormality. Degenerative changes are present in the imaged spine and shoulders. IMPRESSION: Markedly increased bilateral airspace opacities which now involve the upper lungs as well as basilar gradient opacities with at least moderate layering pleural effusions. Findings could reflect worsening pulmonary edema or infection. Aortic Atherosclerosis (ICD10-I70.0). Electronically Signed   By: Lovena Le M.D.   On: 09/16/2019 04:38    ____________________________________________   PROCEDURES   Procedure(s) performed (including Critical Care):  .Critical Care Performed by: Hinda Kehr, MD Authorized by: Hinda Kehr, MD   Critical care provider statement:    Critical care time (minutes):  45    Critical care time was exclusive of:  Separately billable procedures and treating other patients   Critical care was necessary to treat or prevent imminent or life-threatening deterioration of the following conditions:  Respiratory failure and cardiac failure   Critical care was time spent personally by me on the following activities:  Development of treatment plan with patient or surrogate, discussions with consultants, evaluation of patient's response to treatment, examination of patient, obtaining history from patient or surrogate, ordering and performing treatments and interventions, ordering and review of laboratory studies, ordering and review of radiographic studies, pulse oximetry, re-evaluation of patient's condition  and review of old charts .1-3 Lead EKG Interpretation Performed by: Hinda Kehr, MD Authorized by: Hinda Kehr, MD     Interpretation: normal     ECG rate:  86   ECG rate assessment: normal     Rhythm: sinus rhythm     Ectopy: none     Conduction: normal       ____________________________________________   INITIAL IMPRESSION / MDM / ASSESSMENT AND PLAN / ED COURSE  As part of my medical decision making, I reviewed the following data within the [facility] History obtained from family, Nursing notes reviewed and incorporated, Labs reviewed , EKG interpreted , Old chart reviewed, Radiograph reviewed , Discussed with admitting physician Darel Hong in the ICU), Discussed with nephrologist (Dr. Candiss Norse) and reviewed Notes from prior ED visits   Differential diagnosis includes, but is not limited to, acute CHF exacerbation and volume overload with pulmonary edema, healthcare associated pneumonia, PE, ACS.  The patient is able to tell me she is in no pain but that she cannot breathe.  She was hypoxemic upon arrival in spite of her chronic oxygen was placed on nonrebreather.  As soon as I saw her I ordered BiPAP which initially she had a hard time with  but then and she was able to tolerate it and her breathing started to improve.  She is severely hypertensive and even though she is chronically elevated in the 150s she is currently at about 200/100, so I ordered 1 inch of nitroglycerin paste.  Due to overwhelming ED and hospital volume we initially placed her in a room in the flex area because there were no other rooms available.  As soon as she was stabilized on BiPAP we are moving her to the main side of the emergency department.  I will contact nephrology because the patient will need emergent dialysis.  The patient's daughter-in-law has been updated and the patient agrees with plan as well.  I strongly doubt infectious process given the acute onset of the symptoms and this morning is her usual dialysis day.  She is afebrile and the symptoms came on suddenly and she appears most consistent with volume overload/pulmonary edema.  The patient is on the cardiac monitor to evaluate for evidence of arrhythmia and/or significant heart rate changes.     Clinical Course as of Sep 18 615  Thu Sep 18, 2019  0501 Discussed case with Dr. Candiss Norse including need for emergent dialysis.  He is calling dialysis nurse.  He agreed with holding off on antibiotics given small probability of infectious process.  Agreed wtih plan thus far.   [CF]  0505 The patient is feeling much better and tolerating BiPAP well.  Her blood pressure is still very elevated at about 197/97, however, and spite of the nitroglycerin paste.   [CF]  0509 pO2, Ven(!): 57.0 [CF]  0509 pCO2, Ven: 51 [CF]  0516 Discussed case by phone with Darel Hong in the ICU.  He will admit for further management.  He knows the plan is for emergent dialysis this morning.  Reportedly there is insufficient staff to take any additional patients in the ICU, so I have made arrangements to move the patient into ED room 17 where dialysis can be performed.   [CF]  0608 Patient sleeping on bipap   [CF]      Clinical Course User Index [CF] Hinda Kehr, MD     ____________________________________________  FINAL CLINICAL IMPRESSION(S) / ED DIAGNOSES  Final diagnoses:  Acute respiratory  failure with hypoxia (HCC)  Acute pulmonary edema (HCC)  Acute on chronic congestive heart failure, unspecified heart failure type ([place])  ESRD on hemodialysis ([place])     MEDICATIONS GIVEN DURING THIS VISIT:  Medications  nitroGLYCERIN 50 mg in dextrose 5 % 250 mL (0.2 mg/mL) infusion (18 mcg/min Intravenous New Bag/Given 08/24/2019 0526)  docusate sodium (COLACE) capsule 100 mg (has no administration in time range)  polyethylene glycol (MIRALAX / GLYCOLAX) packet 17 g (has no administration in time range)  acetaminophen (TYLENOL) tablet 650 mg (has no administration in time range)  ondansetron (ZOFRAN) injection 4 mg (has no administration in time range)  ipratropium-albuterol (DUONEB) 0.5-2.5 (3) MG/3ML nebulizer solution 3 mL (has no administration in time range)  Chlorhexidine Gluconate Cloth 2 % PADS 6 each (has no administration in time range)  ampicillin-sulbactam (UNASYN) 1.5 g in sodium chloride 0.9 % 100 mL IVPB (has no administration in time range)  furosemide (LASIX) injection 40 mg (40 mg Intravenous Given 08/25/2019 0603)     ED Discharge Orders    None      *Please note:  Yamaira Spinner was evaluated in Emergency Department on 09/22/2019 for the symptoms described in the history of present illness. She was evaluated in the context of the global COVID-19 pandemic, which necessitated consideration that the patient might be at risk for infection with the SARS-CoV-2 virus that causes COVID-19. Institutional protocols and algorithms that pertain to the evaluation of patients at risk for COVID-19 are in a state of rapid change based on information released by regulatory bodies including the CDC and federal and state organizations. These policies and algorithms were followed during the  patient's care in the ED.  Some ED evaluations and interventions may be delayed as a result of limited staffing during the pandemic.*  Note:  This document was prepared using Dragon voice recognition software and may include unintentional dictation errors.   Hinda Kehr, MD 08/23/[id number]

## 2019-09-18 NOTE — Progress Notes (Signed)
HD Initiated    09/07/2019 0741  Vital Signs  Pulse Rate 92  Pulse Rate Source Monitor  Resp (!) 29  BP (!) 175/81  BP Location Left Arm  BP Method Automatic  Patient Position (if appropriate) Lying  Oxygen Therapy  SpO2 99 %  O2 Device Bi-PAP  During Hemodialysis Assessment  Blood Flow Rate (mL/min) 400 mL/min  Arterial Pressure (mmHg) -110 mmHg  Venous Pressure (mmHg) 120 mmHg  Transmembrane Pressure (mmHg) 80 mmHg  Ultrafiltration Rate (mL/min) 1000 mL/min  Dialysate Flow Rate (mL/min) 600 ml/min  Conductivity: Machine  14  HD Safety Checks Performed Yes  Dialysis Fluid Bolus Normal Saline  Bolus Amount (mL) 250 mL  Intra-Hemodialysis Comments Tx initiated

## 2019-09-18 NOTE — Progress Notes (Signed)
Pharmacy Antibiotic Note  Marlene Lard Aricela Bertagnolli is a 69 y.o. female admitted on 09/09/2019 with pneumonia.  Pharmacy has been consulted for Unasyn dosing.  Plan: Unasyn 1.5gm IV q12hrs (renally adjusted)  Height: 4\' 11"  (149.9 cm) Weight: 36.6 kg (80 lb 11 oz) IBW/kg (Calculated) : 43.2  Temp (24hrs), Avg:98.4 F (36.9 C), Min:98.2 F (36.8 C), Max:98.8 F (37.1 C)  Recent Labs  Lab 09/16/19 0632 09/16/19 0703 09/17/19 0455 08/31/2019 0433  WBC  --  9.0 8.0 16.1*  CREATININE 4.33*  --  1.91* 3.12*    Estimated Creatinine Clearance: 9.7 mL/min (A) (by C-G formula based on SCr of 3.12 mg/dL (H)).    No Known Allergies  Antimicrobials this admission:   >>    >>   Dose adjustments this admission:   Microbiology results:  BCx:   UCx:    Sputum:    MRSA PCR:   Thank you for allowing pharmacy to be a part of this patient's care.  Hart Robinsons A 08/28/2019 6:15 AM

## 2019-09-18 NOTE — ED Notes (Signed)
Dr. Mortimer Fries informed of improvement in pt's BP, verbal order given to stop nitro drip.

## 2019-09-18 NOTE — ED Notes (Signed)
Remains on HD.  Treatment to be complete in 1 hour 38 minutes.  Will transfer patient to ICU after dialysis complete.

## 2019-09-18 NOTE — H&P (Signed)
Name: Briante Loveall MRN: [id number] DOB: 1950/09/13    ADMISSION DATE:  09/04/2019 CONSULTATION DATE:  09/05/2019  REFERRING MD :  Dr. Karma Greaser  CHIEF COMPLAINT:  Acute Respiratory Distress   BRIEF PATIENT DESCRIPTION:  69 y.o. Female with PMH of ESRD on HD (T,Th,S) and HFpEF admitted 09/08/2019 with Acute on Chronic Hypoxic Respiratory Failure in the setting of Pulmonary Edema/Volume Overload & questionable Aspiration Pneumonia (severe Epistaxis 5/25), Hypertensive Emergency requiring Nitroglycerin drip and emergent HD.  SIGNIFICANT EVENTS  5/27: Presents to ED; emergent HD, PCCM consulted for admission  STUDIES:  5/27: CXR>>Markedly increased bilateral airspace opacities which now involve the upper lungs as well as basilar gradient opacities with at least moderate layering pleural effusions. Findings could reflect worsening pulmonary edema or infection. 5/27: 2D Echocardiogram>>  CULTURES: SARS-CoV-2 PCR 5/25>> Negative Sputum 5/27>>  ANTIBIOTICS: Unasyn 5/27>>  HISTORY OF PRESENT ILLNESS:   Amber Dixon is a 69 year old female with a past medical history significant for end-stage renal disease on hemodialysis (T,Th,S), HFpEF, hypertension, diabetes mellitus, anemia of chronic disease who presents to [facility] ED on 09/17/2019 with acute respiratory distress.  Of note she was hospitalized recently on 09/16/2019 and discharged yesterday on 09/17/2019 due to acute blood loss anemia in the setting of severe epistaxis.  During that admission she received 1 unit of packed red blood cells with hemodialysis.  She was discharged home with no respiratory distress.  Yesterday evening she was straining to have a bowel movement due to chronic constipation, and after straining in the bathroom began to feel short of breath with audible wheezing.  She went to sleep but woke up in severe respiratory distress this morning.  She was noted to be hypoxic with O2 saturations in the low 70s  on her home baseline of 2 L supplemental oxygen.  Upon arrival to the ED she was noted to be in severe respiratory distress of which she was placed on BiPAP.  She was also noted to be significantly hypertensive with blood pressure 197/88.  Initial work-up reveals WBC 16.1 (with neutrophilia and lymphocytopenia), hemoglobin 7.6, BNP 3360, high-sensitivity troponin 14, BUN 27, creatinine 3.12, glucose 156.  Chest x-ray with increased bilateral airspace opacities involving both the upper and basilar fields concerning for pulmonary edema versus pneumonia.  Procalcitonin is currently pending.  Her SARS-CoV-2 PCR was negative on 09/16/2019.  ED provider notified Dr. Candiss Norse with Nephrology who has ordered for emergent hemodialysis.  She was also placed on nitroglycerin infusion for hypertensive emergency. PCCM is asked to admit the patient for further work-up and treatment of acute on chronic hypoxic respiratory failure secondary to pulmonary edema/volume overload and questionable aspiration pneumonia requiring BiPAP, along with hypertensive emergency requiring nitroglycerin infusion.Marland Kitchen  PAST MEDICAL HISTORY :   has a past medical history of Diabetes mellitus without complication ([place]), Hypertension, and Renal disorder.  has a past surgical history that includes Leg Surgery; IR US Guide Vasc Access Right (04/02/2019); IR Fluoro Guide CV Line Right (04/02/2019); IR Fluoro Guide CV Line Right (04/13/2019); AV fistula placement (Right, 04/24/2019); AV fistula placement (Right); and Hip fracture surgery (Right). Prior to Admission medications   Medication Sig Start Date End Date Taking? Authorizing Provider  acetaminophen (TYLENOL) 325 MG tablet Take 650 mg by mouth every 6 (six) hours as needed for mild pain or fever.     [provider]  amLODipine (NORVASC) 5 MG tablet Take 1 tablet (5 mg total) by mouth daily. 09/06/19   Danford, Suann Larry, MD  carvedilol (COREG) 6.25 MG tablet Take 1 tablet (6.25 mg  total) by mouth 2 (two) times daily with a meal. 09/09/19 12/08/19  Enzo Bi, MD  feeding supplement, GLUCERNA SHAKE, (GLUCERNA SHAKE) LIQD Take 237 mLs by mouth 3 (three) times daily between meals. 09/09/19   Enzo Bi, MD  furosemide (LASIX) 80 MG tablet Take 1 tablet (80 mg total) by mouth every other day. 09/06/19   Danford, Suann Larry, MD   No Known Allergies  FAMILY HISTORY:  family history is not on file. SOCIAL HISTORY:  reports that she has never smoked. She has never used smokeless tobacco. She reports previous drug use.   COVID-19 DISASTER DECLARATION:  FULL CONTACT PHYSICAL EXAMINATION WAS NOT POSSIBLE DUE TO TREATMENT OF COVID-19 AND  CONSERVATION OF PERSONAL PROTECTIVE EQUIPMENT, LIMITED EXAM FINDINGS INCLUDE-  Patient assessed or the symptoms described in the history of present illness.  In the context of the Global COVID-19 pandemic, which necessitated consideration that the patient might be at risk for infection with the SARS-CoV-2 virus that causes COVID-19, Institutional protocols and algorithms that pertain to the evaluation of patients at risk for COVID-19 are in a state of rapid change based on information released by regulatory bodies including the CDC and federal and state organizations. These policies and algorithms were followed during the patient's care while in hospital.  REVIEW OF SYSTEMS:  Positives in BOLD Constitutional: Negative for fever, +chills, weight loss, malaise/fatigue and diaphoresis.  HENT: Negative for hearing loss, ear pain, nosebleeds, congestion, sore throat, neck pain, tinnitus and ear discharge.   Eyes: Negative for blurred vision, double vision, photophobia, pain, discharge and redness.  Respiratory: Negative for +cough, hemoptysis, sputum production, +shortness of breath, wheezing and stridor.   Cardiovascular: Negative for +chest discomfort, palpitations, orthopnea, claudication, leg swelling and PND.  Gastrointestinal: Negative for  heartburn, nausea, vomiting, abdominal pain, diarrhea, constipation, blood in stool and melena.  Genitourinary: Negative for dysuria, urgency, frequency, hematuria and flank pain.  Musculoskeletal: Negative for myalgias, back pain, joint pain and falls.  Skin: Negative for itching and rash.  Neurological: Negative for +dizziness, tingling, tremors, sensory change, speech change, focal weakness, seizures, loss of consciousness, weakness and headaches.  Endo/Heme/Allergies: Negative for environmental allergies and polydipsia. Does not bruise/bleed easily.  SUBJECTIVE:  Pt reports dizziness, chest discomfort, SOB, dry cough, and chills Denies palpitations, wheezing, Abdominal pain, N/V/D, dysuria On BiPAP, appears comfortable  VITAL SIGNS: Temp:  [98.2 F (36.8 C)-98.8 F (37.1 C)] 98.8 F (37.1 C) (05/27 0452) Pulse Rate:  [83-92] 90 (05/27 0443) Resp:  [16-32] 19 (05/27 0443) BP: (150-188)/(64-93) 188/93 (05/27 0443) SpO2:  [77 %-100 %] 100 % (05/27 0443) Weight:  [36.6 kg] 36.6 kg (05/27 0418)  PHYSICAL EXAMINATION: General: Acute on chronically ill-appearing female, laying in bed, on BiPAP, no acute distress Neuro: Awake, alert and oriented x4, follows commands, no focal deficits, speech clear HEENT: Atraumatic, normocephalic, neck supple, positive JVD Cardiovascular: Regular rate and rhythm, S1-S2, no murmurs, rubs, gallops, 2+ pulses Lungs: Coarse rhonchi auscultated throughout, BiPAP assisted, even, mild tachypnea Abdomen: Soft, nontender, nondistended, no guarding or rebound tenderness, bowel sounds positive x4 Musculoskeletal: Normal bulk and tone, no deformities, no edema Skin: Warm and dry.  No obvious rashes, lesions, ulcerations  Recent Labs  Lab 09/16/19 0632 09/17/19 0455  NA 126* 137  K 5.3* 3.7  CL 90* 98  CO2 26 29  BUN 44* 15  CREATININE 4.33* 1.91*  GLUCOSE 127* 96   Recent Labs  Lab 09/16/19 0703  09/16/19 0703 09/16/19 2007 09/17/19 0455  09/14/2019 0433  HGB 5.9*   < > 8.2* 7.8* 7.6*  HCT 18.3*   < > 23.5* 22.6* 23.3*  WBC 9.0  --   --  8.0 16.1*  PLT 269  --   --  203 229   < > = values in this interval not displayed.   CT HEAD WO CONTRAST  Result Date: 09/16/2019 CLINICAL DATA:  Headaches and epistaxis EXAM: CT HEAD WITHOUT CONTRAST TECHNIQUE: Contiguous axial images were obtained from the base of the skull through the vertex without intravenous contrast. COMPARISON:  08/16/2019 FINDINGS: Brain: Scattered atrophic changes and chronic white matter ischemic change is seen. The overall appearance is stable from the prior study. No findings to suggest acute hemorrhage, acute infarction or space-occupying mass lesion are seen. Vascular: No hyperdense vessel or unexpected calcification. Skull: Normal. Negative for fracture or focal lesion. Sinuses/Orbits: No acute finding. Other: None. IMPRESSION: Chronic atrophic and ischemic changes without acute abnormality. Electronically Signed   By: Inez Catalina M.D.   On: 09/16/2019 10:29   DG Chest Portable 1 View  Result Date: 09/19/2019 CLINICAL DATA:  Respiratory failure, ESRD on hemodialysis EXAM: PORTABLE CHEST 1 VIEW COMPARISON:  Radiograph 09/16/2019 FINDINGS: Markedly increased bilateral airspace opacities which now extensively involve the upper lungs as well as more gradient opacity towards the bases with at least moderate layering pleural effusions. No visible pneumothorax. Cardiomediastinal contours are largely obscured though the visible contours are unchanged from prior with a calcified tortuous aorta. A right IJ approach Port-A-Cath tip terminates at the approximate level of the right atrium. Telemetry leads overlie the chest. No acute osseous or soft tissue abnormality. Degenerative changes are present in the imaged spine and shoulders. IMPRESSION: Markedly increased bilateral airspace opacities which now involve the upper lungs as well as basilar gradient opacities with at least  moderate layering pleural effusions. Findings could reflect worsening pulmonary edema or infection. Aortic Atherosclerosis (ICD10-I70.0). Electronically Signed   By: Lovena Le M.D.   On: 09/10/2019 04:38   DG Chest Port 1 View  Result Date: 09/16/2019 CLINICAL DATA:  Epistaxis, respiratory distress EXAM: PORTABLE CHEST 1 VIEW COMPARISON:  Radiograph 09/07/2019 FINDINGS: Tunneled right IJ approach dual lumen dialysis catheter tip terminates in similar position near the expected level of the right atrium. There is increasing bilateral pleural effusions, right greater than left with adjacent areas of mixed heterogeneous opacity likely reflecting a combination of volume loss and airspace disease, possibly alveolar edema and/or infectious consolidation. No visible pneumothorax. Suspect cardiomegaly though portions of the heart borders are obscured by opacity. Calcified tortuous aorta is similar to prior. No acute osseous or soft tissue abnormality. Degenerative changes are present in the imaged spine and shoulders. Telemetry leads overlie the chest. IMPRESSION: Increasing bilateral pleural effusions, right greater than left with adjacent areas of mixed heterogeneous opacity likely reflecting a combination of volume loss and airspace disease, possibly alveolar edema and/or infectious consolidation. Electronically Signed   By: Lovena Le M.D.   On: 09/16/2019 06:56    ASSESSMENT / PLAN:  Acute on Chronic Hypoxic Respiratory Failure in the setting of Pulmonary Edema/Volume Overload & ? Aspiration Pneumonia Hx: Chronic Hypoxic Respiratory Failure requring 2L supplemental O2 -Supplemental O2 as needed to maintain O2 saturations greater than 92% -BiPAP, wean as tolerated -High risk for intubation -Follow intermittent chest x-ray and ABG as needed -Chest x-ray on 5/27 with pulmonary edema versus pneumonia -Will give Lasix x1 dose -Emergent hemodialysis for volume removal -  Place on empiric Unasyn for  now  Questionable Aspiration Pneumonia in the setting of recent severe epistaxis -Monitor fever curve -Trend WBCs and procalcitonin -Follow cultures as above  -Place on empiric Unasyn for now  Hypertensive Emergency Acute on Chronic HFpEF Chest pain Hx: HTN -Continuous cardiac monitoring -Place on nitroglycerin drip to maintain SBP <160 -Volume removal with hemodialysis -Check troponin ~ 14 -EKG reassuring with no clear evidence of acute ischemia -Obtain 2D Echocardiogram  ESRD on HD (T, Th, S) -Monitor I&O's / urinary output -Follow BMP -Ensure adequate renal perfusion -Avoid nephrotoxic agents as able -Replace electrolytes as indicated -Nephrology consulted, appreciate input -Orders placed for emergent hemodialysis by Nephrology  Anemia of chronic disease with Superimposed Recent acute blood loss anemia on 09/16/19 due to severe Epistaxis -Monitor for S/Sx of bleeding -Trend CBC -SCD's for VTE Prophylaxis (avoid chemical prophylaxis given recent severe Epistaxis)  -Transfuse for Hgb <7 -Received 1 unit pRBC on 5/25 with Hemodialysis  Diabetes Mellitus -CBG's -SSI if needed -Follow ICU Hypo/Hyperglycemia protocol           BEST PRACTICES DISPOSITION: ICU GOALS OF CARE: Full Code VTE PROPHYLAXIS: SCD's CONSULTS: Nephrology UPDATES: Updated pt and her daughter-in-law at bedside 09/13/2019   Darel Hong, [facility] [place] [facility] Pager: [phone]  09/17/2019, 5:27 AM

## 2019-09-18 NOTE — Progress Notes (Signed)
[facility]  ROUNDING NOTE   Subjective:  Patient was discharged from the hospital yesterday but presented back to the emergency room in the middle of the night for respiratory distress Patient was initially placed on nonrebreather then required BiPAP Underwent urgent dialysis this morning   HEMODIALYSIS FLOWSHEET:  Blood Flow Rate (mL/min): 400 mL/min Arterial Pressure (mmHg): -140 mmHg Venous Pressure (mmHg): 170 mmHg Transmembrane Pressure (mmHg): 60 mmHg Ultrafiltration Rate (mL/min): 550 mL/min Dialysate Flow Rate (mL/min): 600 ml/min Conductivity: Machine : 14 Conductivity: Machine : 14 Dialysis Fluid Bolus: Normal Saline Bolus Amount (mL): 250 mL    Objective:  Vital signs in last 24 hours:  Temp:  [98.8 F (37.1 C)-99.4 F (37.4 C)] 99.4 F (37.4 C) (05/27 1100) Pulse Rate:  [85-99] 99 (05/27 1147) Resp:  [15-35] 27 (05/27 1147) BP: (112-197)/(61-157) 172/102 (05/27 1100) SpO2:  [77 %-100 %] 94 % (05/27 1147) Weight:  [36.6 kg] 36.6 kg (05/27 0418)  Weight change:  Filed Weights   09/11/2019 0418  Weight: 36.6 kg    Intake/Output: No intake/output data recorded.   Intake/Output this shift:  Total I/O In: -  Out: 2593 [Other:2593]  Physical Exam: General:  Chronically ill-appearing, thin, cachectic, appears uncomfortable  Head: Normocephalic, atraumatic.  Lungs:   Noninvasive ventilation, bilateral diffuse crackles  Heart: Regular rate and rhythm  Abdomen:  Soft, nontender,   Extremities: trace peripheral edema.  Neurologic: Nonfocal, able to follow commands  Skin: No lesions  Access: Right arm AVF RIJ permcath    Basic Metabolic Panel: Recent Labs  Lab 09/16/19 0632 09/17/19 0455 09/06/2019 0433  NA 126* 137 135  K 5.3* 3.7 4.6  CL 90* 98 95*  CO2 26 29 30   GLUCOSE 127* 96 156*  BUN 44* 15 27*  CREATININE 4.33* 1.91* 3.12*  CALCIUM 7.5* 7.7* 8.1*  MG  --   --  2.1    Liver Function Tests: Recent Labs  Lab  09/16/19 0632 09/08/2019 0433  AST 24 24  ALT 12 17  ALKPHOS 78 100  BILITOT 0.9 0.7  PROT 5.5* 6.9  ALBUMIN 2.5* 3.4*   No results for input(s): LIPASE, AMYLASE in the last 168 hours. No results for input(s): AMMONIA in the last 168 hours.  CBC: Recent Labs  Lab 09/16/19 0703 09/16/19 2007 09/17/19 0455 09/07/2019 0433  WBC 9.0  --  8.0 16.1*  NEUTROABS 7.4  --   --  14.3*  HGB 5.9* 8.2* 7.8* 7.6*  HCT 18.3* 23.5* 22.6* 23.3*  MCV 84.7  --  82.2 87.3  PLT 269  --  203 229    Cardiac Enzymes: No results for input(s): CKTOTAL, CKMB, CKMBINDEX, TROPONINI in the last 168 hours.  BNP: Invalid input(s): POCBNP  CBG: Recent Labs  Lab 09/17/19 0444 09/17/19 0744 09/17/19 1152 09/17/19 1600 08/27/2019 1149  GLUCAP 86 73 164* 267* 80    Microbiology: Results for orders placed or performed during the hospital encounter of 09/16/19  SARS Coronavirus 2 by RT PCR (hospital order, performed in [facility] hospital lab) Nasopharyngeal Nasopharyngeal Swab     Status: None   Collection Time: 09/16/19  9:58 AM   Specimen: Nasopharyngeal Swab  Result Value Ref Range Status   SARS Coronavirus 2 NEGATIVE NEGATIVE Final    Comment: (NOTE) SARS-CoV-2 target nucleic acids are NOT DETECTED. The SARS-CoV-2 RNA is generally detectable in upper and lower respiratory specimens during the acute phase of infection. The lowest concentration of SARS-CoV-2 viral copies this assay can detect  is 250 copies / mL. A negative result does not preclude SARS-CoV-2 infection and should not be used as the sole basis for treatment or other patient management decisions.  A negative result may occur with improper specimen collection / handling, submission of specimen other than nasopharyngeal swab, presence of viral mutation(s) within the areas targeted by this assay, and inadequate number of viral copies (<250 copies / mL). A negative result must be combined with clinical observations, patient history,  and epidemiological information. Fact Sheet for Patients:   StrictlyIdeas.no Fact Sheet for Healthcare Providers: BankingDealers.co.za This test is not yet approved or cleared  by the [place] FDA and has been authorized for detection and/or diagnosis of SARS-CoV-2 by FDA under an Emergency Use Authorization (EUA).  This EUA will remain in effect (meaning this test can be used) for the duration of the COVID-19 declaration under Section 564(b)(1) of the Act, 21 U.S.C. section 360bbb-3(b)(1), unless the authorization is terminated or revoked sooner. Performed at [facility], [address] [place], [place] [address]     Coagulation Studies: Recent Labs    09/16/19 2671 09/15/2019 0433  LABPROT 14.2 13.8  INR 1.1 1.1    Urinalysis: No results for input(s): COLORURINE, LABSPEC, PHURINE, GLUCOSEU, HGBUR, BILIRUBINUR, KETONESUR, PROTEINUR, UROBILINOGEN, NITRITE, LEUKOCYTESUR in the last 72 hours.  Invalid input(s): APPERANCEUR    Imaging: DG Chest Portable 1 View  Result Date: 09/11/2019 CLINICAL DATA:  Respiratory failure, ESRD on hemodialysis EXAM: PORTABLE CHEST 1 VIEW COMPARISON:  Radiograph 09/16/2019 FINDINGS: Markedly increased bilateral airspace opacities which now extensively involve the upper lungs as well as more gradient opacity towards the bases with at least moderate layering pleural effusions. No visible pneumothorax. Cardiomediastinal contours are largely obscured though the visible contours are unchanged from prior with a calcified tortuous aorta. A right IJ approach Port-A-Cath tip terminates at the approximate level of the right atrium. Telemetry leads overlie the chest. No acute osseous or soft tissue abnormality. Degenerative changes are present in the imaged spine and shoulders. IMPRESSION: Markedly increased bilateral airspace opacities which now involve the upper lungs as well as basilar gradient  opacities with at least moderate layering pleural effusions. Findings could reflect worsening pulmonary edema or infection. Aortic Atherosclerosis (ICD10-I70.0). Electronically Signed   By: Lovena Le M.D.   On: 09/04/2019 04:38     Medications:   . ampicillin-sulbactam (UNASYN) IV    . nitroGLYCERIN 33 mcg/min (09/02/2019 0807)   . Chlorhexidine Gluconate Cloth  6 each Topical Q0600  . insulin aspart  0-6 Units Subcutaneous Q4H   acetaminophen, docusate sodium, hydrALAZINE, ipratropium-albuterol, morphine injection, ondansetron (ZOFRAN) IV, polyethylene glycol  Assessment/ Plan:  Amber Dixon is a 69 y.o. Hispanic female with end stage renal disease on hemodialysis, diabetes mellitus, hypertension who was admitted today, 09/05/2019 , for Acute pulmonary edema ([place]) [J81.0] Acute respiratory failure with hypoxia ([place]) [J96.01] ESRD on hemodialysis ([place]) [N18.6, Z99.2] Acute on chronic congestive heart failure, unspecified heart failure type ([place]) [I50.9]   [facility] TTS [facility] RIJ permcath.  #Acute pulmonary edema and volume overload #End Stage renal disease   Requiring noninvasive positive pressure ventilation earlier today Urgent hemodialysis performed this morning.  2600 cc fluid removed We will reevaluate patient for dialysis tomorrow  #Severe anemia caused by blood loss from epistaxis and anemia of chronic kidney disease Patient received blood transfusion during dialysis earlier this week Schedule Epogen with hemodialysis  #Severe hypertension Likely causing cardiac strain Placed on Nitropatch in the emergency  room Reevaluate after volume removal with dialysis       LOS: 0 Carra Brindley 5/27/20213:53 PM

## 2019-09-18 NOTE — Progress Notes (Signed)
HD Complete     09/02/2019 1054  Vital Signs  Temp 99.4 F (37.4 C)  Temp Source Axillary  Pulse Rate 94  Resp 17  BP (!) 165/83  BP Location Left Arm  BP Method Automatic  Patient Position (if appropriate) Lying  Oxygen Therapy  SpO2 100 %  O2 Device Bi-PAP  Pain Assessment  Pain Scale Faces  Faces Pain Scale 4  During Hemodialysis Assessment  HD Safety Checks Performed Yes  KECN 68.4 [facility]  Dialysis Fluid Bolus Normal Saline  Bolus Amount (mL) 250 mL  Intra-Hemodialysis Comments Tx completed

## 2019-09-18 NOTE — ED Notes (Signed)
Dialysis at bedside

## 2019-09-18 NOTE — Progress Notes (Signed)
*  PRELIMINARY RESULTS* Echocardiogram 2D Echocardiogram has been performed.  Amber Dixon [name] [name] 09/04/2019, 3:35 PM

## 2019-09-18 NOTE — Progress Notes (Signed)
Pre HD Pt in room ED 9, moved to room ED 17 for HD tx. Pt received on Bi-pap, with nitro drip. Pt restless, c/o chest pain; primary RN notified to which nitro drip adjusted, PRN morphine given PIV by primary RN. Pt assigned UF profile #2 by Candiss Norse, MD.    09/05/2019 0735  Vital Signs  Pulse Rate 93  Pulse Rate Source Monitor  Resp (!) 35  BP (!) 172/85  BP Location Left Arm  BP Method Automatic  Patient Position (if appropriate) Lying  Oxygen Therapy  SpO2 98 %  O2 Device Bi-PAP  Pain Assessment  Pain Scale Faces  Faces Pain Scale 8  Dialysis Weight  Weight  (Unable to weigh pt on bipap)  Type of Weight Pre-Dialysis  Time-Out for Hemodialysis  What Procedure? HD   Pt Identifiers(min of two) First/Last Name;MRN/Account#;Pt's DOB(use if MRN/Acct# not available  Correct Site? Yes  Correct Side? Yes  Correct Procedure? Yes  Consents Verified? Yes  Rad Studies Available? N/A  Safety Precautions Reviewed? Yes  Engineer, civil (consulting) Number 2  Station Number  (ED 17)  UF/Alarm Test Passed  Conductivity: Meter 14  pH 7.2  Reverse Osmosis 6  Normal Saline Lot Number [id number]  Dialyzer Lot Number [id number]  Disposable Set Lot Number [id number]  Machine Temperature 98.6 F (37 C)  Musician and Audible Yes  Blood Lines Intact and Secured Yes  Pre Treatment Patient Checks  Vascular access used during treatment Catheter  HD catheter dressing before treatment WDL  Patient is receiving dialysis in a chair  (no)  Hepatitis B Surface Antigen Results Negative  Date Hepatitis B Surface Antigen Drawn 08/19/19  Isolation Initiated  (yes)  Hepatitis B Surface Antibody  (<10)  Date Hepatitis B Surface Antibody Drawn 08/19/19  Hemodialysis Consent Verified Yes  Hemodialysis Standing Orders Initiated Yes  ECG (Telemetry) Monitor On Yes  Prime Ordered Normal Saline  Length of  DialysisTreatment -hour(s) 3 Hour(s)  Dialysis Treatment Comments  (Na 140)  Dialyzer Elisio 17H NR   Dialysate 3K;2.5 Ca  Dialysate Flow Ordered 600  Blood Flow Rate Ordered 400 mL/min  Ultrafiltration Goal 3 Liters  Ultrafiltration Profile 2 (Per Candiss Norse, MD)  Dialysis Blood Pressure Support Ordered Normal Saline  Education / Care Plan  Dialysis Education Provided No (Comment)  Fistula / Graft Right Forearm Arteriovenous fistula  No Placement Date or Time found.   Placed prior to admission: No  Orientation: Right  Access Location: Forearm  Access Type: Arteriovenous fistula  Site Condition No complications  Fistula / Graft Assessment Bruit;Thrill;Present  Status Accessed  Needle Size 15  Drainage Description None  Hemodialysis Catheter Right Subclavian  No Placement Date or Time found.   Placed prior to admission: Yes  Orientation: Right  Access Location: Subclavian  Site Condition No complications  Blue Lumen Status Capped (Central line)  Red Lumen Status Blood return noted  Purple Lumen Status N/A  Dressing Type Occlusive  Dressing Status Dry;Intact;Clean  Interventions  (none)  Drainage Description None

## 2019-09-18 NOTE — Progress Notes (Signed)
HD Tx  Pt flushed with 134mL NS to clear clotting venous chamber. UF adjusted up to 3100 mL, UF profile #2 continues.    09/19/2019 0900  Vital Signs  Pulse Rate 93  Resp (!) 26  BP 136/77  Oxygen Therapy  SpO2 100 %  O2 Device Bi-PAP  During Hemodialysis Assessment  Blood Flow Rate (mL/min) 400 mL/min  Arterial Pressure (mmHg) -120 mmHg  Venous Pressure (mmHg) 140 mmHg  Transmembrane Pressure (mmHg) 80 mmHg  Ultrafiltration Rate (mL/min) 1320 mL/min  Dialysate Flow Rate (mL/min) 600 ml/min  Conductivity: Machine  14  HD Safety Checks Performed Yes  Intra-Hemodialysis Comments See progress note

## 2019-09-18 NOTE — Progress Notes (Signed)
Updated Suanne Marker that patient was discharged but is back this morning. Will continue to follow.  Oleh Genin, [place]

## 2019-09-18 NOTE — Progress Notes (Signed)
The Clinical status was relayed to family in detail using Spanish Interpreter at bedside VIA ipad. Son and daughter in law at bedside.  Patient on biPAP struggling to breathe, patient is Alert and awake and oriented x 4. I have explained to the patient and family taht patient is suffering and struggling. She wants to eat and drink and I explained that she is at very high risk for death and aspiration. SHe is in dying process right now.     Updated and notified of patients medical condition.  Explained to family course of therapy and the modalities     Patient with Progressive multiorgan failure with very low chance of meaningful recovery despite all aggressive and optimal medical therapy.  Patient is in the dying  Process associated with suffering.  Family understands the situation.  They have consented and agreed to DNR/DNI.  Will plan to continue biPAP and possible daily dialysis as tolerated We will also assist family to come from [place] based on administrative policy and procedures.  Family are satisfied with Plan of action and management. All questions answered  Additional CC time 32 mins   Kurian Patricia Pesa, M.D.  [facility]  Medical Director [facility] Director [facility] Cardio-Pulmonary Department

## 2019-09-19 ENCOUNTER — Inpatient Hospital Stay: Payer: Medicaid Other

## 2019-09-19 ENCOUNTER — Encounter: Payer: Self-pay | Admitting: Internal Medicine

## 2019-09-19 DIAGNOSIS — I5033 Acute on chronic diastolic (congestive) heart failure: Secondary | ICD-10-CM

## 2019-09-19 DIAGNOSIS — N186 End stage renal disease: Secondary | ICD-10-CM

## 2019-09-19 DIAGNOSIS — D631 Anemia in chronic kidney disease: Secondary | ICD-10-CM

## 2019-09-19 DIAGNOSIS — Z7189 Other specified counseling: Secondary | ICD-10-CM

## 2019-09-19 DIAGNOSIS — Z515 Encounter for palliative care: Secondary | ICD-10-CM

## 2019-09-19 LAB — GLUCOSE, CAPILLARY
Glucose-Capillary: 103 mg/dL — ABNORMAL HIGH (ref 70–99)
Glucose-Capillary: 154 mg/dL — ABNORMAL HIGH (ref 70–99)
Glucose-Capillary: 205 mg/dL — ABNORMAL HIGH (ref 70–99)
Glucose-Capillary: 51 mg/dL — ABNORMAL LOW (ref 70–99)
Glucose-Capillary: 78 mg/dL (ref 70–99)
Glucose-Capillary: 84 mg/dL (ref 70–99)
Glucose-Capillary: 86 mg/dL (ref 70–99)
Glucose-Capillary: 91 mg/dL (ref 70–99)

## 2019-09-19 LAB — BASIC METABOLIC PANEL
Anion gap: 11 (ref 5–15)
BUN: 19 mg/dL (ref 8–23)
CO2: 30 mmol/L (ref 22–32)
Calcium: 8.3 mg/dL — ABNORMAL LOW (ref 8.9–10.3)
Chloride: 101 mmol/L (ref 98–111)
Creatinine, Ser: 2.48 mg/dL — ABNORMAL HIGH (ref 0.44–1.00)
GFR calc Af Amer: 22 mL/min — ABNORMAL LOW (ref >60)
GFR calc non Af Amer: 19 mL/min — ABNORMAL LOW (ref >60)
Glucose, Bld: 92 mg/dL (ref 70–99)
Potassium: 4.6 mmol/L (ref 3.5–5.1)
Sodium: 142 mmol/L (ref 135–145)

## 2019-09-19 LAB — PHOSPHORUS: Phosphorus: 4.3 mg/dL (ref 2.5–4.6)

## 2019-09-19 LAB — CBC
HCT: 23.4 % — ABNORMAL LOW (ref 36.0–46.0)
Hemoglobin: 7.5 g/dL — ABNORMAL LOW (ref 12.0–15.0)
MCH: 28.1 pg (ref 26.0–34.0)
MCHC: 32.1 g/dL (ref 30.0–36.0)
MCV: 87.6 fL (ref 80.0–100.0)
Platelets: 229 10*3/uL (ref 150–400)
RBC: 2.67 MIL/uL — ABNORMAL LOW (ref 3.87–5.11)
RDW: 16.6 % — ABNORMAL HIGH (ref 11.5–15.5)
WBC: 22.9 10*3/uL — ABNORMAL HIGH (ref 4.0–10.5)
nRBC: 0 % (ref 0.0–0.2)

## 2019-09-19 LAB — PROCALCITONIN: Procalcitonin: 9.23 ng/mL

## 2019-09-19 LAB — MAGNESIUM: Magnesium: 1.9 mg/dL (ref 1.7–2.4)

## 2019-09-19 MED ORDER — MORPHINE SULFATE (PF) 2 MG/ML IV SOLN
2.0000 mg | Freq: Once | INTRAVENOUS | Status: AC
  Administered 2019-09-19: 2 mg via INTRAVENOUS
  Filled 2019-09-19: qty 1

## 2019-09-19 MED ORDER — DEXMEDETOMIDINE HCL IN NACL 400 MCG/100ML IV SOLN
0.4000 ug/kg/h | INTRAVENOUS | Status: DC
  Filled 2019-09-19 (×2): qty 100

## 2019-09-19 MED ORDER — SODIUM CHLORIDE 0.9 % IV SOLN
3.0000 g | INTRAVENOUS | Status: DC
  Administered 2019-09-19: 3 g via INTRAVENOUS
  Filled 2019-09-19: qty 8
  Filled 2019-09-19: qty 3

## 2019-09-19 MED ORDER — DEXTROSE 50 % IV SOLN: 12.5000 g | INTRAVENOUS | Status: DC

## 2019-09-19 MED ORDER — EPOETIN ALFA 10000 UNIT/ML IJ SOLN: 4000.0000 [IU] | INTRAMUSCULAR | Status: DC

## 2019-09-19 MED ORDER — FUROSEMIDE 10 MG/ML IJ SOLN: 40.0000 mg | Freq: Once | INTRAMUSCULAR | Status: AC

## 2019-09-19 MED ORDER — DEXMEDETOMIDINE HCL IN NACL 400 MCG/100ML IV SOLN
0.4000 ug/kg/h | INTRAVENOUS | Status: DC
  Administered 2019-09-19: 0.6 ug/kg/h via INTRAVENOUS
  Administered 2019-09-20: 1.2 ug/kg/h via INTRAVENOUS

## 2019-09-19 MED ORDER — FUROSEMIDE 10 MG/ML IJ SOLN
INTRAMUSCULAR | Status: AC
  Administered 2019-09-19: 40 mg via INTRAVENOUS
  Filled 2019-09-19: qty 4

## 2019-09-19 MED ORDER — DEXTROSE 50 % IV SOLN
1.0000 | INTRAVENOUS | Status: DC | PRN
  Administered 2019-09-19: 50 mL via INTRAVENOUS

## 2019-09-19 MED ORDER — MORPHINE SULFATE (PF) 2 MG/ML IV SOLN
2.0000 mg | INTRAVENOUS | Status: DC | PRN
  Administered 2019-09-19 – 2019-09-20 (×7): 2 mg via INTRAVENOUS
  Filled 2019-09-19 (×9): qty 1

## 2019-09-19 NOTE — Progress Notes (Addendum)
Pt with Respiratory Distress and hypoxia despite max support with BiPAP.  Repeat CXR with worsening diffuse interstitial and airspace disease concerning for worsening pulmonary edema.  Will give 40 mg IV Lasix.  Also spoke with Dr. Candiss Norse of Nephrology and discussed need for URGENT Dialysis.  Per Dr. Candiss Norse, she will be placed on top priority for HD today.  Prn Morphine increased.  Discussed these findings and poor prognosis with pt and her son via interpreter. If she doesn't improve with HD, will need to reassess goals of care, Palliative Care consulted.  Pt again confirms that she DOES NOT want to be intubated, continue with DNR/DNI.   Darel Hong, AGACNP-BC [facility] Pager: [phone]

## 2019-09-19 NOTE — Progress Notes (Addendum)
Progress Note    Rehanna Oloughlin  JSE:[id number] DOB: [birth year]  DOA: 09/08/2019 PCP: Patient, No Pcp Per      Brief Narrative:    Medical records reviewed and are as summarized below:  Ingeborg Fite is a 69 y.o. female with a past medical history significant for end-stage renal disease on hemodialysis (T,Th,S), HFpEF, hypertension, diabetes mellitus, anemia of chronic disease who presented to [facility] ED on 09/06/2019 with acute respiratory distress.  Of note she was hospitalized recently on 09/16/2019 and discharged  on 09/17/2019 due to acute blood loss anemia in the setting of severe epistaxis.  During that admission she received 1 unit of packed red blood cells with hemodialysis.  She was discharged home with no respiratory distress.  She was admitted to the ICU for hypertensive emergency, acute hypoxemic respiratory failure and acute pulmonary edema from CHF exacerbation and fluid overload from ESRD.  She was treated with IV nitroglycerin infusion.  She underwent hemodialysis.  She was subsequently transferred to stepdown unit for continued medical management.      Assessment/Plan:   Active Problems:   Acute respiratory failure with hypoxia (HCC)   Acute hypoxemic respiratory failure: Continue BiPAP as tolerated.  End-stage renal disease, acute on chronic diastolic CHF, severe pulmonary hypertension: Patient is on dialysis for fluid management.  Follow-up with nephrologist for dialysis.  2D echo on 08/31/2019 showed EF of 50 to 07%, grade 1 diastolic dysfunction, severe pulmonary hypertension and moderate tricuspid regurgitation.  Severe anemia from acute blood loss from recent epistaxis and anemia of chronic kidney disease: H&H stable.  Continue to monitor.  Hypertensive emergency: Patient was weaned off of nitroglycerin drip on 09/12/2019.  Continue antihypertensives  Leukocytosis, probable aspiration pneumonia: Continue empiric IV Unasyn.  Body  mass index is 16.3 kg/m.  (Underweight)   Family Communication/Anticipated D/C date and plan/Code Status   DVT prophylaxis: Heparin Code Status: DNR Family Communication: Plan discussed with her son and grandson at the bedside.  Communication was done using Spanish interpretation services with iPad Disposition Plan: To be determined   Status is: Inpatient  Remains inpatient appropriate because:IV treatments appropriate due to intensity of illness or inability to take PO and Inpatient level of care appropriate due to severity of illness   Dispo: The patient is from: Home              Anticipated d/c is to: Home              Anticipated d/c date is: 3 days              Patient currently is not medically stable to d/c.            Subjective:   Patient is confused unable to provide any history.  Objective:    Vitals:   09/19/19 0500 09/19/19 0530 09/19/19 0600 09/19/19 0830  BP: (!) 168/71 (!) 167/73 (!) 165/66   Pulse: 91 93 93 93  Resp: (!) 28 20 17 20   Temp:      TempSrc:      SpO2: 93% 90% (!) 88% 90%  Weight:      Height:       No data found.   Intake/Output Summary (Last 24 hours) at 09/19/2019 0906 Last data filed at 09/16/2019 2000 Gross per 24 hour  Intake 177.95 ml  Output 2593 ml  Net -2415.05 ml   Filed Weights   09/10/2019 0418  Weight: 36.6 kg  Exam:  GEN: restless, on BiPAP SKIN: No rash EYES: EOMI ENT: MMM CV: RRR PULM: CTA B ABD: soft, ND, NT, +BS CNS: alert, confused EXT: No edema or tenderness   Data Reviewed:   I have personally reviewed following labs and imaging studies:  Labs: Labs show the following:   Basic Metabolic Panel: Recent Labs  Lab 09/16/19 0632 09/16/19 0632 09/17/19 0455 09/17/19 0455 09/11/2019 0433 09/19/19 0548  NA 126*  --  137  --  135 142  K 5.3*   < > 3.7   < > 4.6 4.6  CL 90*  --  98  --  95* 101  CO2 26  --  29  --  30 30  GLUCOSE 127*  --  96  --  156* 92  BUN 44*  --  15  --  27*  19  CREATININE 4.33*  --  1.91*  --  3.12* 2.48*  CALCIUM 7.5*  --  7.7*  --  8.1* 8.3*  MG  --   --   --   --  2.1 1.9  PHOS  --   --   --   --   --  4.3   < > = values in this interval not displayed.   GFR Estimated Creatinine Clearance: 12.2 mL/min (A) (by C-G formula based on SCr of 2.48 mg/dL (H)). Liver Function Tests: Recent Labs  Lab 09/16/19 0632 08/30/2019 0433  AST 24 24  ALT 12 17  ALKPHOS 78 100  BILITOT 0.9 0.7  PROT 5.5* 6.9  ALBUMIN 2.5* 3.4*   No results for input(s): LIPASE, AMYLASE in the last 168 hours. No results for input(s): AMMONIA in the last 168 hours. Coagulation profile Recent Labs  Lab 09/16/19 4332 09/04/2019 0433  INR 1.1 1.1    CBC: Recent Labs  Lab 09/16/19 0703 09/16/19 2007 09/17/19 0455 09/11/2019 0433 09/19/19 0548  WBC 9.0  --  8.0 16.1* 22.9*  NEUTROABS 7.4  --   --  14.3*  --   HGB 5.9* 8.2* 7.8* 7.6* 7.5*  HCT 18.3* 23.5* 22.6* 23.3* 23.4*  MCV 84.7  --  82.2 87.3 87.6  PLT 269  --  203 229 229   Cardiac Enzymes: No results for input(s): CKTOTAL, CKMB, CKMBINDEX, TROPONINI in the last 168 hours. BNP (last 3 results) No results for input(s): PROBNP in the last 8760 hours. CBG: Recent Labs  Lab 09/13/2019 2025 08/28/2019 2326 09/19/19 0002 09/19/19 0349 09/19/19 0720  GLUCAP 84 69* 154* 91 84   D-Dimer: No results for input(s): DDIMER in the last 72 hours. Hgb A1c: No results for input(s): HGBA1C in the last 72 hours. Lipid Profile: No results for input(s): CHOL, HDL, LDLCALC, TRIG, CHOLHDL, LDLDIRECT in the last 72 hours. Thyroid function studies: No results for input(s): TSH, T4TOTAL, T3FREE, THYROIDAB in the last 72 hours.  Invalid input(s): FREET3 Anemia work up: No results for input(s): VITAMINB12, FOLATE, FERRITIN, TIBC, IRON, RETICCTPCT in the last 72 hours. Sepsis Labs: Recent Labs  Lab 09/16/19 0703 09/16/19 0726 09/17/19 0455 09/04/2019 0433 09/19/19 0548  PROCALCITON  --  0.19  --  0.70 9.23  WBC  9.0  --  8.0 16.1* 22.9*    Microbiology Recent Results (from the past 240 hour(s))  SARS Coronavirus 2 by RT PCR (hospital order, performed in [facility] hospital lab) Nasopharyngeal Nasopharyngeal Swab     Status: None   Collection Time: 09/16/19  9:58 AM   Specimen: Nasopharyngeal Swab  Result  Value Ref Range Status   SARS Coronavirus 2 NEGATIVE NEGATIVE Final    Comment: (NOTE) SARS-CoV-2 target nucleic acids are NOT DETECTED. The SARS-CoV-2 RNA is generally detectable in upper and lower respiratory specimens during the acute phase of infection. The lowest concentration of SARS-CoV-2 viral copies this assay can detect is 250 copies / mL. A negative result does not preclude SARS-CoV-2 infection and should not be used as the sole basis for treatment or other patient management decisions.  A negative result may occur with improper specimen collection / handling, submission of specimen other than nasopharyngeal swab, presence of viral mutation(s) within the areas targeted by this assay, and inadequate number of viral copies (<250 copies / mL). A negative result must be combined with clinical observations, patient history, and epidemiological information. Fact Sheet for Patients:   StrictlyIdeas.no Fact Sheet for Healthcare Providers: BankingDealers.co.za This test is not yet approved or cleared  by the [place] FDA and has been authorized for detection and/or diagnosis of SARS-CoV-2 by FDA under an Emergency Use Authorization (EUA).  This EUA will remain in effect (meaning this test can be used) for the duration of the COVID-19 declaration under Section 564(b)(1) of the Act, 21 U.S.C. section 360bbb-3(b)(1), unless the authorization is terminated or revoked sooner. Performed at [facility], [address], [place], [place] [address]     Procedures and diagnostic studies:  DG Chest [facility] 1 View  Result Date:  09/19/2019 CLINICAL DATA:  Acute respiratory failure. EXAM: PORTABLE CHEST 1 VIEW COMPARISON:  One-view chest x-ray 09/02/2019. FINDINGS: Heart is enlarged. Diffuse interstitial and airspace disease has progressed. Progressive bilateral pulmonary opacification is noted. Right IJ dialysis catheter is stable. IMPRESSION: Worsening diffuse interstitial and airspace disease. This likely reflects edema. Infection is not excluded. Electronically Signed   By: San Morelle M.D.   On: 09/19/2019 05:14   DG Chest Portable 1 View  Result Date: 08/27/2019 CLINICAL DATA:  Respiratory failure, ESRD on hemodialysis EXAM: PORTABLE CHEST 1 VIEW COMPARISON:  Radiograph 09/16/2019 FINDINGS: Markedly increased bilateral airspace opacities which now extensively involve the upper lungs as well as more gradient opacity towards the bases with at least moderate layering pleural effusions. No visible pneumothorax. Cardiomediastinal contours are largely obscured though the visible contours are unchanged from prior with a calcified tortuous aorta. A right IJ approach Port-A-Cath tip terminates at the approximate level of the right atrium. Telemetry leads overlie the chest. No acute osseous or soft tissue abnormality. Degenerative changes are present in the imaged spine and shoulders. IMPRESSION: Markedly increased bilateral airspace opacities which now involve the upper lungs as well as basilar gradient opacities with at least moderate layering pleural effusions. Findings could reflect worsening pulmonary edema or infection. Aortic Atherosclerosis (ICD10-I70.0). Electronically Signed   By: Lovena Le M.D.   On: 09/02/2019 04:38   ECHOCARDIOGRAM COMPLETE  Result Date: 08/28/2019    ECHOCARDIOGRAM REPORT   Patient Name:   Marlene Lard [facility]ENIDPO[facility] Date of Exam: 09/06/2019 Medical Rec #:  [id number]                     Height:       59.0 in Accession #:    [id number]                    Weight:       80.7 lb Date of Birth:   Oct 15, 1950  BSA:          1.254 m Patient Age:    21 years                      BP:           189/85 mmHg Patient Gender: F                             HR:           100 bpm. Exam Location:  [facility] Procedure: 2D Echo, Color Doppler and Cardiac Doppler Indications:     I50.31 CHF-Acute Diastolic  History:         Patient has prior history of Echocardiogram examinations, most                  recent 03/22/2019. Risk Factors:Hypertension and Diabetes.  Sonographer:     Charmayne Sheer RDCS (AE) Referring Phys:  [id number] Bradly Bienenstock Diagnosing Phys: Ida Rogue MD  Sonographer Comments: Image acquisition challenging due to respiratory motion and Image acquisition challenging due to patient body habitus. IMPRESSIONS  1. Left ventricular ejection fraction, by estimation, is 50 to 55%. The left ventricle has low normal function. The left ventricle has no regional wall motion abnormalities. There is mild left ventricular hypertrophy. Left ventricular diastolic parameters are consistent with Grade I diastolic dysfunction (impaired relaxation).  2. Right ventricular systolic function is normal. The right ventricular size is mildly enlarged. There is severely elevated pulmonary artery systolic pressure. The estimated right ventricular systolic pressure is 90.2 mmHg.  3. Tricuspid valve regurgitation is moderate. FINDINGS  Left Ventricle: Left ventricular ejection fraction, by estimation, is 50 to 55%. The left ventricle has low normal function. The left ventricle has no regional wall motion abnormalities. The left ventricular internal cavity size was normal in size. There is mild left ventricular hypertrophy. Left ventricular diastolic parameters are consistent with Grade I diastolic dysfunction (impaired relaxation). Right Ventricle: The right ventricular size is mildly enlarged. No increase in right ventricular wall thickness. Right ventricular systolic function is normal. There is severely elevated  pulmonary artery systolic pressure. The tricuspid regurgitant velocity is 4.04 m/s, and with an assumed right atrial pressure of 10 mmHg, the estimated right ventricular systolic pressure is 40.9 mmHg. Left Atrium: Left atrial size was normal in size. Right Atrium: Right atrial size was normal in size. Pericardium: There is no evidence of pericardial effusion. Mitral Valve: The mitral valve is normal in structure. Normal mobility of the mitral valve leaflets. Mild mitral valve regurgitation. No evidence of mitral valve stenosis. MV peak gradient, 11.0 mmHg. The mean mitral valve gradient is 6.0 mmHg. Tricuspid Valve: The tricuspid valve is normal in structure. Tricuspid valve regurgitation is moderate . No evidence of tricuspid stenosis. Aortic Valve: The aortic valve is normal in structure. Aortic valve regurgitation is not visualized. No aortic stenosis is present. Aortic valve mean gradient measures 8.0 mmHg. Aortic valve peak gradient measures 13.0 mmHg. Aortic valve area, by VTI measures 1.00 cm. Pulmonic Valve: The pulmonic valve was normal in structure. Pulmonic valve regurgitation is not visualized. No evidence of pulmonic stenosis. Aorta: The aortic root is normal in size and structure. Venous: The inferior vena cava is normal in size with greater than 50% respiratory variability, suggesting right atrial pressure of 3 mmHg. IAS/Shunts: No atrial level shunt detected by color flow Doppler.  LEFT VENTRICLE PLAX 2D LVIDd:  4.04 cm  Diastology LVIDs:         2.95 cm  LV e' lateral:   3.81 cm/s LV PW:         1.24 cm  LV E/e' lateral: 31.8 LV IVS:        0.87 cm  LV e' medial:    4.57 cm/s LVOT diam:     1.50 cm  LV E/e' medial:  26.5 LV SV:         35 LV SV Index:   28 LVOT Area:     1.77 cm  RIGHT VENTRICLE RV Basal diam:  2.92 cm LEFT ATRIUM             Index       RIGHT ATRIUM          Index LA diam:        3.70 cm 2.95 cm/m  RA Area:     9.69 cm LA Vol (A2C):   24.8 ml 19.78 ml/m RA Volume:    17.20 ml 13.72 ml/m LA Vol (A4C):   39.2 ml 31.27 ml/m LA Biplane Vol: 34.0 ml 27.12 ml/m  AORTIC VALVE                    PULMONIC VALVE AV Area (Vmax):    1.03 cm     PV Vmax:       1.16 m/s AV Area (Vmean):   0.91 cm     PV Vmean:      80.700 cm/s AV Area (VTI):     1.00 cm     PV VTI:        0.165 m AV Vmax:           180.00 cm/s  PV Peak grad:  5.4 mmHg AV Vmean:          135.000 cm/s PV Mean grad:  3.0 mmHg AV VTI:            0.348 m AV Peak Grad:      13.0 mmHg AV Mean Grad:      8.0 mmHg LVOT Vmax:         105.00 cm/s LVOT Vmean:        69.200 cm/s LVOT VTI:          0.197 m LVOT/AV VTI ratio: 0.57  AORTA Ao Root diam: 2.70 cm MITRAL VALVE                TRICUSPID VALVE MV Area (PHT): 5.54 cm     TR Peak grad:   65.3 mmHg MV Peak grad:  11.0 mmHg    TR Vmax:        404.00 cm/s MV Mean grad:  6.0 mmHg MV Vmax:       1.66 m/s     SHUNTS MV Vmean:      113.0 cm/s   Systemic VTI:  0.20 m MV Decel Time: 137 msec     Systemic Diam: 1.50 cm MV E velocity: 121.00 cm/s MV A velocity: 127.00 cm/s MV E/A ratio:  0.95 Ida Rogue MD Electronically signed by Ida Rogue MD Signature Date/Time: 08/30/2019/6:24:03 PM    Final     Medications:   . Chlorhexidine Gluconate Cloth  6 each Topical Q0600  . insulin aspart  0-6 Units Subcutaneous Q4H   Continuous Infusions: . ampicillin-sulbactam (UNASYN) IV    . dexmedetomidine (PRECEDEX) IV infusion Stopped (09/19/19 0442)  . nitroGLYCERIN Stopped (09/11/2019 1020)  LOS: 1 day   Suezette Lafave  [facility]     09/19/2019, 9:06 AM

## 2019-09-19 NOTE — Progress Notes (Signed)
Patient remains on BiPAP most of the day. Tolerated HF oxygen at 94 L for only 3 hours before oxygen saturations dropped and patient complained of shortness of breath. Emergent dialysis improved oxygen saturations and removed 2.5 liters of fluid today.  When awake patient cried and begged for water. Given frequent Morphine to help with air hunger. [place]  in to talk about end of life care with grandson. Patient still restless all day.

## 2019-09-19 NOTE — Progress Notes (Signed)
[facility]  ROUNDING NOTE   Subjective:  Patient was discharged from the hospital yesterday but presented back to the emergency room in the middle of the night for respiratory distress Patient was initially placed on nonrebreather then required BiPAP Underwent urgent dialysis on Thursday morning.  2600 cc of fluid was removed Patient was briefly on nasal cannula but required BiPAP again for continued flash pulmonary edema Urgent hemodialysis requested again today Patient remains on BiPAP Grandchildren at bedside   Objective:  Vital signs in last 24 hours:  Temp:  [97.9 F (36.6 C)-99.4 F (37.4 C)] 98 F (36.7 C) (05/28 0400) Pulse Rate:  [84-102] 93 (05/28 0830) Resp:  [15-37] 20 (05/28 0830) BP: (112-192)/(60-102) 165/66 (05/28 0600) SpO2:  [85 %-100 %] 90 % (05/28 0830) FiO2 (%):  [50 %-100 %] 100 % (05/28 0102)  Weight change:  Filed Weights   09/10/2019 0418  Weight: 36.6 kg    Intake/Output: I/O last 3 completed shifts: In: 178 [I.V.:41.6; IV Piggyback:136.3] Out: 2593 [Other:2593]   Intake/Output this shift:  No intake/output data recorded.  Physical Exam: General:  Chronically ill-appearing, thin, cachectic, appears uncomfortable  Head: Normocephalic, atraumatic.  Lungs:   Noninvasive ventilation, bilateral diffuse crackles  Heart: Regular rate and rhythm  Abdomen:  Soft, nontender,   Extremities: trace peripheral edema.  Neurologic: Somnolent this morning  Skin: No lesions  Access: Right arm AVF RIJ permcath    Basic Metabolic Panel: Recent Labs  Lab 09/16/19 0632 09/16/19 0632 09/17/19 0455 09/12/2019 0433 09/19/19 0548  NA 126*  --  137 135 142  K 5.3*  --  3.7 4.6 4.6  CL 90*  --  98 95* 101  CO2 26  --  29 30 30   GLUCOSE 127*  --  96 156* 92  BUN 44*  --  15 27* 19  CREATININE 4.33*  --  1.91* 3.12* 2.48*  CALCIUM 7.5*   < > 7.7* 8.1* 8.3*  MG  --   --   --  2.1 1.9  PHOS  --   --   --   --  4.3   < > = values in this  interval not displayed.    Liver Function Tests: Recent Labs  Lab 09/16/19 0632 09/15/2019 0433  AST 24 24  ALT 12 17  ALKPHOS 78 100  BILITOT 0.9 0.7  PROT 5.5* 6.9  ALBUMIN 2.5* 3.4*   No results for input(s): LIPASE, AMYLASE in the last 168 hours. No results for input(s): AMMONIA in the last 168 hours.  CBC: Recent Labs  Lab 09/16/19 0703 09/16/19 2007 09/17/19 0455 09/08/2019 0433 09/19/19 0548  WBC 9.0  --  8.0 16.1* 22.9*  NEUTROABS 7.4  --   --  14.3*  --   HGB 5.9* 8.2* 7.8* 7.6* 7.5*  HCT 18.3* 23.5* 22.6* 23.3* 23.4*  MCV 84.7  --  82.2 87.3 87.6  PLT 269  --  203 229 229    Cardiac Enzymes: No results for input(s): CKTOTAL, CKMB, CKMBINDEX, TROPONINI in the last 168 hours.  BNP: Invalid input(s): POCBNP  CBG: Recent Labs  Lab 08/26/2019 2025 09/05/2019 2326 09/19/19 0002 09/19/19 0349 09/19/19 0720  GLUCAP 84 69* 154* 91 84    Microbiology: Results for orders placed or performed during the hospital encounter of 09/16/19  SARS Coronavirus 2 by RT PCR (hospital order, performed in [facility] hospital lab) Nasopharyngeal Nasopharyngeal Swab     Status: None   Collection Time: 09/16/19  9:58  AM   Specimen: Nasopharyngeal Swab  Result Value Ref Range Status   SARS Coronavirus 2 NEGATIVE NEGATIVE Final    Comment: (NOTE) SARS-CoV-2 target nucleic acids are NOT DETECTED. The SARS-CoV-2 RNA is generally detectable in upper and lower respiratory specimens during the acute phase of infection. The lowest concentration of SARS-CoV-2 viral copies this assay can detect is 250 copies / mL. A negative result does not preclude SARS-CoV-2 infection and should not be used as the sole basis for treatment or other patient management decisions.  A negative result may occur with improper specimen collection / handling, submission of specimen other than nasopharyngeal swab, presence of viral mutation(s) within the areas targeted by this assay, and inadequate number of  viral copies (<250 copies / mL). A negative result must be combined with clinical observations, patient history, and epidemiological information. Fact Sheet for Patients:   StrictlyIdeas.no Fact Sheet for Healthcare Providers: BankingDealers.co.za This test is not yet approved or cleared  by the [place] FDA and has been authorized for detection and/or diagnosis of SARS-CoV-2 by FDA under an Emergency Use Authorization (EUA).  This EUA will remain in effect (meaning this test can be used) for the duration of the COVID-19 declaration under Section 564(b)(1) of the Act, 21 U.S.C. section 360bbb-3(b)(1), unless the authorization is terminated or revoked sooner. Performed at [facility], [address] [place], [place] [address]     Coagulation Studies: Recent Labs    09/14/2019 0433  LABPROT 13.8  INR 1.1    Urinalysis: No results for input(s): COLORURINE, LABSPEC, PHURINE, GLUCOSEU, HGBUR, BILIRUBINUR, KETONESUR, PROTEINUR, UROBILINOGEN, NITRITE, LEUKOCYTESUR in the last 72 hours.  Invalid input(s): APPERANCEUR    Imaging: DG Chest Port 1 View  Result Date: 09/19/2019 CLINICAL DATA:  Acute respiratory failure. EXAM: PORTABLE CHEST 1 VIEW COMPARISON:  One-view chest x-ray 08/26/2019. FINDINGS: Heart is enlarged. Diffuse interstitial and airspace disease has progressed. Progressive bilateral pulmonary opacification is noted. Right IJ dialysis catheter is stable. IMPRESSION: Worsening diffuse interstitial and airspace disease. This likely reflects edema. Infection is not excluded. Electronically Signed   By: San Morelle M.D.   On: 09/19/2019 05:14   DG Chest Portable 1 View  Result Date: 09/07/2019 CLINICAL DATA:  Respiratory failure, ESRD on hemodialysis EXAM: PORTABLE CHEST 1 VIEW COMPARISON:  Radiograph 09/16/2019 FINDINGS: Markedly increased bilateral airspace opacities which now extensively involve the upper  lungs as well as more gradient opacity towards the bases with at least moderate layering pleural effusions. No visible pneumothorax. Cardiomediastinal contours are largely obscured though the visible contours are unchanged from prior with a calcified tortuous aorta. A right IJ approach Port-A-Cath tip terminates at the approximate level of the right atrium. Telemetry leads overlie the chest. No acute osseous or soft tissue abnormality. Degenerative changes are present in the imaged spine and shoulders. IMPRESSION: Markedly increased bilateral airspace opacities which now involve the upper lungs as well as basilar gradient opacities with at least moderate layering pleural effusions. Findings could reflect worsening pulmonary edema or infection. Aortic Atherosclerosis (ICD10-I70.0). Electronically Signed   By: Lovena Le M.D.   On: 09/19/2019 04:38   ECHOCARDIOGRAM COMPLETE  Result Date: 09/17/2019    ECHOCARDIOGRAM REPORT   Patient Name:   Amber Dixon [id number] [facility] Date of Exam: 09/03/2019 Medical Rec #:  [id number]                     Height:       59.0 in Accession #:  [id number]                    Weight:       80.7 lb Date of Birth:  January 29, 1951                      BSA:          1.254 m Patient Age:    69 years                      BP:           189/85 mmHg Patient Gender: F                             HR:           100 bpm. Exam Location:  [facility] Procedure: 2D Echo, Color Doppler and Cardiac Doppler Indications:     I50.31 CHF-Acute Diastolic  History:         Patient has prior history of Echocardiogram examinations, most                  recent 03/22/2019. Risk Factors:Hypertension and Diabetes.  Sonographer:     Charmayne Sheer RDCS (AE) Referring Phys:  [id number] Bradly Bienenstock Diagnosing Phys: Ida Rogue MD  Sonographer Comments: Image acquisition challenging due to respiratory motion and Image acquisition challenging due to patient body habitus. IMPRESSIONS  1. Left ventricular ejection fraction, by  estimation, is 50 to 55%. The left ventricle has low normal function. The left ventricle has no regional wall motion abnormalities. There is mild left ventricular hypertrophy. Left ventricular diastolic parameters are consistent with Grade I diastolic dysfunction (impaired relaxation).  2. Right ventricular systolic function is normal. The right ventricular size is mildly enlarged. There is severely elevated pulmonary artery systolic pressure. The estimated right ventricular systolic pressure is 85.6 mmHg.  3. Tricuspid valve regurgitation is moderate. FINDINGS  Left Ventricle: Left ventricular ejection fraction, by estimation, is 50 to 55%. The left ventricle has low normal function. The left ventricle has no regional wall motion abnormalities. The left ventricular internal cavity size was normal in size. There is mild left ventricular hypertrophy. Left ventricular diastolic parameters are consistent with Grade I diastolic dysfunction (impaired relaxation). Right Ventricle: The right ventricular size is mildly enlarged. No increase in right ventricular wall thickness. Right ventricular systolic function is normal. There is severely elevated pulmonary artery systolic pressure. The tricuspid regurgitant velocity is 4.04 m/s, and with an assumed right atrial pressure of 10 mmHg, the estimated right ventricular systolic pressure is 31.4 mmHg. Left Atrium: Left atrial size was normal in size. Right Atrium: Right atrial size was normal in size. Pericardium: There is no evidence of pericardial effusion. Mitral Valve: The mitral valve is normal in structure. Normal mobility of the mitral valve leaflets. Mild mitral valve regurgitation. No evidence of mitral valve stenosis. MV peak gradient, 11.0 mmHg. The mean mitral valve gradient is 6.0 mmHg. Tricuspid Valve: The tricuspid valve is normal in structure. Tricuspid valve regurgitation is moderate . No evidence of tricuspid stenosis. Aortic Valve: The aortic valve is normal  in structure. Aortic valve regurgitation is not visualized. No aortic stenosis is present. Aortic valve mean gradient measures 8.0 mmHg. Aortic valve peak gradient measures 13.0 mmHg. Aortic valve area, by VTI measures 1.00 cm. Pulmonic Valve: The pulmonic valve was normal in structure. Pulmonic valve regurgitation is not visualized.  No evidence of pulmonic stenosis. Aorta: The aortic root is normal in size and structure. Venous: The inferior vena cava is normal in size with greater than 50% respiratory variability, suggesting right atrial pressure of 3 mmHg. IAS/Shunts: No atrial level shunt detected by color flow Doppler.  LEFT VENTRICLE PLAX 2D LVIDd:         4.04 cm  Diastology LVIDs:         2.95 cm  LV e' lateral:   3.81 cm/s LV PW:         1.24 cm  LV E/e' lateral: 31.8 LV IVS:        0.87 cm  LV e' medial:    4.57 cm/s LVOT diam:     1.50 cm  LV E/e' medial:  26.5 LV SV:         35 LV SV Index:   28 LVOT Area:     1.77 cm  RIGHT VENTRICLE RV Basal diam:  2.92 cm LEFT ATRIUM             Index       RIGHT ATRIUM          Index LA diam:        3.70 cm 2.95 cm/m  RA Area:     9.69 cm LA Vol (A2C):   24.8 ml 19.78 ml/m RA Volume:   17.20 ml 13.72 ml/m LA Vol (A4C):   39.2 ml 31.27 ml/m LA Biplane Vol: 34.0 ml 27.12 ml/m  AORTIC VALVE                    PULMONIC VALVE AV Area (Vmax):    1.03 cm     PV Vmax:       1.16 m/s AV Area (Vmean):   0.91 cm     PV Vmean:      80.700 cm/s AV Area (VTI):     1.00 cm     PV VTI:        0.165 m AV Vmax:           180.00 cm/s  PV Peak grad:  5.4 mmHg AV Vmean:          135.000 cm/s PV Mean grad:  3.0 mmHg AV VTI:            0.348 m AV Peak Grad:      13.0 mmHg AV Mean Grad:      8.0 mmHg LVOT Vmax:         105.00 cm/s LVOT Vmean:        69.200 cm/s LVOT VTI:          0.197 m LVOT/AV VTI ratio: 0.57  AORTA Ao Root diam: 2.70 cm MITRAL VALVE                TRICUSPID VALVE MV Area (PHT): 5.54 cm     TR Peak grad:   65.3 mmHg MV Peak grad:  11.0 mmHg    TR Vmax:         404.00 cm/s MV Mean grad:  6.0 mmHg MV Vmax:       1.66 m/s     SHUNTS MV Vmean:      113.0 cm/s   Systemic VTI:  0.20 m MV Decel Time: 137 msec     Systemic Diam: 1.50 cm MV E velocity: 121.00 cm/s MV A velocity: 127.00 cm/s MV E/A ratio:  0.95 Ida Rogue MD Electronically signed by Ida Rogue MD Signature  Date/Time: 09/15/2019/6:24:03 PM    Final      Medications:   . ampicillin-sulbactam (UNASYN) IV    . dexmedetomidine (PRECEDEX) IV infusion Stopped (09/19/19 0442)  . nitroGLYCERIN Stopped (09/01/2019 1020)   . Chlorhexidine Gluconate Cloth  6 each Topical Q0600  . insulin aspart  0-6 Units Subcutaneous Q4H   acetaminophen, docusate sodium, hydrALAZINE, ipratropium-albuterol, morphine injection, ondansetron (ZOFRAN) IV, polyethylene glycol  Assessment/ Plan:  Ms. Shaneque Merkle is a 69 y.o. Hispanic female with end stage renal disease on hemodialysis, diabetes mellitus, hypertension who was admitted today, 09/16/2019 , for Acute pulmonary edema ([place]) [J81.0] Acute respiratory failure with hypoxia ([place]) [J96.01] ESRD on hemodialysis ([place]) [N18.6, Z99.2] Acute on chronic congestive heart failure, unspecified heart failure type ([place]) [I50.9]   [facility] TTS [facility] RIJ permcath.  #Acute pulmonary edema and volume overload #End Stage renal disease   Requiring noninvasive positive pressure ventilation earlier today Urgent hemodialysis on Thursday.  2600 fluid was removed Repeat urgent hemodialysis this morning.  Target again 2.5 to 3 L as tolerated Orders prepared for Saturday also.  #Severe anemia caused by blood loss from epistaxis and anemia of chronic kidney disease Patient received blood transfusion during dialysis earlier this week Schedule Epogen with hemodialysis  #Severe hypertension Likely causing cardiac strain Placed on Nitropatch in the emergency room Reevaluate after volume removal with dialysis       LOS: 1 Dennies Coate 5/28/20218:32 AM

## 2019-09-19 NOTE — Progress Notes (Signed)
1700 Oxygen saturations dropped drastically. Placed back on BiPAP at 100%.

## 2019-09-19 NOTE — Consult Note (Signed)
Consultation Note Date: 09/19/2019   Patient Name: Amber Dixon  DOB: 21-May-1950  MRN: [id number]  Age / Sex: 69 y.o., female  PCP: Patient, No Pcp Per Referring Physician: Jennye Boroughs, MD  Reason for Consultation: Establishing goals of care  HPI/Patient Profile: 69 y.o. Female with PMH of ESRD on HD (T,Th,S) and HFpEF admitted 09/12/2019 with Acute on Chronic Hypoxic Respiratory Failure in the setting of Pulmonary Edema/Volume Overload & questionable Aspiration Pneumonia (severe Epistaxis 5/25), Hypertensive Emergency requiring Nitroglycerin drip and emergent HD.  Clinical Assessment and Goals of Care: Patient is resting in bed sideways with high flow cannula in place. Yolanda Bonine is at bedside. He speaks [place]. He states his grandmother lives with his uncle. He states prior to around a couple of weeks ago, his grandmother was able to get around the house. He does not further detail her normal activities.   He states she drinks too much water and then has SOB. He states he does not want his grandmother to suffer.   Yolanda Bonine states his uncle has recently left the hospital and has gone home to rest. He states his mother is one of the family members attempting to get an emergency visa to come here to visit. He states the family is aware she is dying and would like to try to keep her alive to see them. Educated on E link communications. He states the family is aware she may not be able to live long enough to see family members.      Will f/u next week for further.     SUMMARY OF RECOMMENDATIONS   Recommend continued [place] conversations over the weekend between the attending team abd family. Palliative team will follow up next week for further.   Prognosis:   Poor       Primary Diagnoses: Present on Admission: . Acute respiratory failure with hypoxia ([place])   I have reviewed the medical  record, interviewed the patient and family, and examined the patient. The following aspects are pertinent.  Past Medical History:  Diagnosis Date  . Diabetes mellitus without complication ([place])   . Hypertension   . Renal disorder    Social History   Socioeconomic History  . Marital status: Single    Spouse name: Not on file  . Number of children: Not on file  . Years of education: Not on file  . Highest education level: Not on file  Occupational History  . Not on file  Tobacco Use  . Smoking status: Never Smoker  . Smokeless tobacco: Never Used  Substance and Sexual Activity  . Alcohol use: Not on file  . Drug use: Not Currently  . Sexual activity: Not on file  Other Topics Concern  . Not on file  Social History Narrative   ** Merged History Encounter **       Social Determinants of Health   Financial Resource Strain:   . Difficulty of Paying Living Expenses:   Food Insecurity:   . Worried About Charity fundraiser  in the Last Year:   . [place] in the Last Year:   Transportation Needs:   . Film/video editor (Medical):   Marland Kitchen Lack of Transportation (Non-Medical):   Physical Activity:   . Days of Exercise per Week:   . Minutes of Exercise per Session:   Stress:   . Feeling of Stress :   Social Connections:   . Frequency of Communication with Friends and Family:   . Frequency of Social Gatherings with Friends and Family:   . Attends Religious Services:   . Active Member of Clubs or Organizations:   . Attends Archivist Meetings:   Marland Kitchen Marital Status:    History reviewed. No pertinent family history. Scheduled Meds: . Chlorhexidine Gluconate Cloth  6 each Topical Q0600  . [START ON 15-Oct-2019] epoetin (EPOGEN/PROCRIT) injection  4,000 Units Intravenous Q T,Th,Sa-HD  . insulin aspart  0-6 Units Subcutaneous Q4H   Continuous Infusions: . ampicillin-sulbactam (UNASYN) IV    . dexmedetomidine (PRECEDEX) IV infusion Stopped (09/19/19 0442)  .  nitroGLYCERIN Stopped (09/22/2019 1020)   PRN Meds:.acetaminophen, docusate sodium, hydrALAZINE, ipratropium-albuterol, morphine injection, ondansetron (ZOFRAN) IV, polyethylene glycol Medications Prior to Admission:  Prior to Admission medications   Medication Sig Start Date End Date Taking? Authorizing Provider  acetaminophen (TYLENOL) 325 MG tablet Take 650 mg by mouth every 6 (six) hours as needed for mild pain or fever.    Yes [provider]  amLODipine (NORVASC) 5 MG tablet Take 1 tablet (5 mg total) by mouth daily. 09/06/19  Yes Danford, Suann Larry, MD  carvedilol (COREG) 6.25 MG tablet Take 1 tablet (6.25 mg total) by mouth 2 (two) times daily with a meal. 09/09/19 12/08/19 Yes Enzo Bi, MD  feeding supplement, GLUCERNA SHAKE, (GLUCERNA SHAKE) LIQD Take 237 mLs by mouth 3 (three) times daily between meals. 09/09/19  Yes Enzo Bi, MD  furosemide (LASIX) 80 MG tablet Take 1 tablet (80 mg total) by mouth every other day. Patient not taking: Reported on 09/22/2019 09/06/19   Edwin Dada, MD   No Known Allergies Review of Systems  Unable to perform ROS   Physical Exam Pulmonary:     Comments: High flow cannula.  Neurological:     Mental Status: She is alert.     Vital Signs: BP (!) 152/58   Pulse 90   Temp 98.2 F (36.8 C)   Resp 18   Ht 4\' 11"  (1.499 m)   Wt 36.6 kg   SpO2 100%   BMI 16.30 kg/m  Pain Scale: Faces       SpO2: SpO2: 100 % O2 Device:SpO2: 100 % O2 Flow Rate: .O2 Flow Rate (L/min): 45 L/min  IO: Intake/output summary:   Intake/Output Summary (Last 24 hours) at 09/19/2019 1604 Last data filed at 08/30/2019 2000 Gross per 24 hour  Intake 100 ml  Output --  Net 100 ml    LBM: Last BM Date: 09/19/19 Baseline Weight: Weight: 36.6 kg Most recent weight: Weight: (Unable to weigh pt on bipap)     Palliative Assessment/Data:     Time In: 3:25 Time Out: :4:00 Time Total: 35 min Greater than 50%  of this time was spent counseling  and coordinating care related to the above assessment and plan.  Signed by: Asencion Gowda, NP   Please contact Palliative Medicine Team phone at [phone] for questions and concerns.  For individual provider: See Shea Evans

## 2019-09-19 NOTE — Consult Note (Signed)
Pharmacy Antibiotic Note  Amber Dixon is a 69 y.o. female admitted on 08/25/2019 with aspiration pneumonia. PMH of ESRD on HD T/Th/S and HFpEF Pharmacy was consulted for Unasyn dosing. This is day # 2 of Unasyn, leukocytosis worsening but afebrile over the previous 24 hours  Plan:  Adjust Unasyn dose to 3 grams IV every 24 hours timed for after HD sessions  Height: 4\' 11"  (149.9 cm) Weight: (Unable to weigh pt on bipap) IBW/kg (Calculated) : 43.2  Temp (24hrs), Avg:98.6 F (37 C), Min:97.9 F (36.6 C), Max:99.4 F (37.4 C)  Recent Labs  Lab 09/16/19 0632 09/16/19 0703 09/17/19 0455 09/16/2019 0433 09/19/19 0548  WBC  --  9.0 8.0 16.1* 22.9*  CREATININE 4.33*  --  1.91* 3.12* 2.48*    Estimated Creatinine Clearance: 12.2 mL/min (A) (by C-G formula based on SCr of 2.48 mg/dL (H)).    No Known Allergies  Antimicrobials this admission: Unasyn 5/27 >>   Microbiology results: 5/10 BCx: NG final 5/25 SARS CoV-2: negative  Thank you for allowing pharmacy to be a part of this patient's care.  Dallie Piles 09/19/2019 7:22 AM

## 2019-09-19 NOTE — Progress Notes (Signed)
1830 Patient became extremely agitated with respirations in the 40s. Did not calm after Morphine given prn. Dr. Lanney Gins called. Started on Precedex drip after new IV # 22 started in left thumb per [id number] Patient claming slowly. IV wrapped in coban.

## 2019-09-19 NOTE — H&P (Signed)
Name: Amber Dixon MRN: [id number] DOB: 1950-09-01    ADMISSION DATE:  09/06/2019 CONSULTATION DATE:  09/14/2019  REFERRING MD :  Dr. Karma Greaser  CHIEF COMPLAINT:  Acute Respiratory Distress   BRIEF PATIENT DESCRIPTION:  69 y.o. Female with PMH of ESRD on HD (T,Th,S) and HFpEF admitted 09/16/2019 with Acute on Chronic Hypoxic Respiratory Failure in the setting of Pulmonary Edema/Volume Overload & questionable Aspiration Pneumonia (severe Epistaxis 5/25), Hypertensive Emergency requiring Nitroglycerin drip and emergent HD.  SIGNIFICANT EVENTS  5/27: Presents to ED; emergent HD, PCCM consulted for admission  STUDIES:  5/27: CXR>>Markedly increased bilateral airspace opacities which now involve the upper lungs as well as basilar gradient opacities with at least moderate layering pleural effusions. Findings could reflect worsening pulmonary edema or infection. 5/27: 2D Echocardiogram>>  CULTURES: SARS-CoV-2 PCR 5/25>> Negative Sputum 5/27>>  ANTIBIOTICS: Unasyn 5/27>>  HISTORY OF PRESENT ILLNESS:   Amber Dixon is a 69 year old female with a past medical history significant for end-stage renal disease on hemodialysis (T,Th,S), HFpEF, hypertension, diabetes mellitus, anemia of chronic disease who presents to [facility] ED on 09/16/2019 with acute respiratory distress.  Of note she was hospitalized recently on 09/16/2019 and discharged yesterday on 09/17/2019 due to acute blood loss anemia in the setting of severe epistaxis.  During that admission she received 1 unit of packed red blood cells with hemodialysis.  She was discharged home with no respiratory distress.  Yesterday evening she was straining to have a bowel movement due to chronic constipation, and after straining in the bathroom began to feel short of breath with audible wheezing.  She went to sleep but woke up in severe respiratory distress this morning.  She was noted to be hypoxic with O2 saturations in the low 70s  on her home baseline of 2 L supplemental oxygen.  Upon arrival to the ED she was noted to be in severe respiratory distress of which she was placed on BiPAP.  She was also noted to be significantly hypertensive with blood pressure 197/88.  Initial work-up reveals WBC 16.1 (with neutrophilia and lymphocytopenia), hemoglobin 7.6, BNP 3360, high-sensitivity troponin 14, BUN 27, creatinine 3.12, glucose 156.  Chest x-ray with increased bilateral airspace opacities involving both the upper and basilar fields concerning for pulmonary edema versus pneumonia.  Procalcitonin is currently pending.  Her SARS-CoV-2 PCR was negative on 09/16/2019.  ED provider notified Dr. Candiss Norse with Nephrology who has ordered for emergent hemodialysis.  She was also placed on nitroglycerin infusion for hypertensive emergency. PCCM is asked to admit the patient for further work-up and treatment of acute on chronic hypoxic respiratory failure secondary to pulmonary edema/volume overload and questionable aspiration pneumonia requiring BiPAP, along with hypertensive emergency requiring nitroglycerin infusion..   09/19/19-grandson is at bedside we discussed care plan and answered questions.  Patient was severely encephalopathic and agitated today despite multiple doses of Ativan.  She is DNR but is unable to protect her airway there is concern for aspiration.  At this time patient will require Precedex drip due to severe agitation and risk for harm to self/falling out of bed.  PAST MEDICAL HISTORY :   has a past medical history of Diabetes mellitus without complication ([place]), Hypertension, and Renal disorder.  has a past surgical history that includes Leg Surgery; IR US Guide Vasc Access Right (04/02/2019); IR Fluoro Guide CV Line Right (04/02/2019); IR Fluoro Guide CV Line Right (04/13/2019); AV fistula placement (Right, 04/24/2019); AV fistula placement (Right); and Hip fracture surgery (Right). Prior to Admission  medications   Medication  Sig Start Date End Date Taking? Authorizing Provider  acetaminophen (TYLENOL) 325 MG tablet Take 650 mg by mouth every 6 (six) hours as needed for mild pain or fever.     [provider]  amLODipine (NORVASC) 5 MG tablet Take 1 tablet (5 mg total) by mouth daily. 09/06/19   Danford, Suann Larry, MD  carvedilol (COREG) 6.25 MG tablet Take 1 tablet (6.25 mg total) by mouth 2 (two) times daily with a meal. 09/09/19 12/08/19  Enzo Bi, MD  feeding supplement, GLUCERNA SHAKE, (GLUCERNA SHAKE) LIQD Take 237 mLs by mouth 3 (three) times daily between meals. 09/09/19   Enzo Bi, MD  furosemide (LASIX) 80 MG tablet Take 1 tablet (80 mg total) by mouth every other day. 09/06/19   Danford, Suann Larry, MD   No Known Allergies  FAMILY HISTORY:  family history is not on file. SOCIAL HISTORY:  reports that she has never smoked. She has never used smokeless tobacco. She reports previous drug use.     REVIEW OF SYSTEMS:  Positives in BOLD Constitutional: Negative for fever, +chills, weight loss, malaise/fatigue and diaphoresis.  HENT: Negative for hearing loss, ear pain, nosebleeds, congestion, sore throat, neck pain, tinnitus and ear discharge.   Eyes: Negative for blurred vision, double vision, photophobia, pain, discharge and redness.  Respiratory: Negative for +cough, hemoptysis, sputum production, +shortness of breath, wheezing and stridor.   Cardiovascular: Negative for +chest discomfort, palpitations, orthopnea, claudication, leg swelling and PND.  Gastrointestinal: Negative for heartburn, nausea, vomiting, abdominal pain, diarrhea, constipation, blood in stool and melena.  Genitourinary: Negative for dysuria, urgency, frequency, hematuria and flank pain.  Musculoskeletal: Negative for myalgias, back pain, joint pain and falls.  Skin: Negative for itching and rash.  Neurological: Negative for +dizziness, tingling, tremors, sensory change, speech change, focal weakness, seizures, loss of  consciousness, weakness and headaches.  Endo/Heme/Allergies: Negative for environmental allergies and polydipsia. Does not bruise/bleed easily.  SUBJECTIVE:  Pt reports dizziness, chest discomfort, SOB, dry cough, and chills Denies palpitations, wheezing, Abdominal pain, N/V/D, dysuria On BiPAP, appears comfortable  VITAL SIGNS: Temp:  [97.9 F (36.6 C)-98.2 F (36.8 C)] 98.2 F (36.8 C) (05/28 1600) Pulse Rate:  [80-101] 97 (05/28 1800) Resp:  [12-28] 28 (05/28 1800) BP: (107-175)/(57-85) 175/73 (05/28 1800) SpO2:  [51 %-100 %] 100 % (05/28 1800) FiO2 (%):  [70 %-100 %] 100 % (05/28 1800)  PHYSICAL EXAMINATION: General: Acute on chronically ill-appearing female, laying in bed, on BiPAP, no acute distress Neuro: Awake, alert and oriented x4, follows commands, no focal deficits, speech clear HEENT: Atraumatic, normocephalic, neck supple, positive JVD Cardiovascular: Regular rate and rhythm, S1-S2, no murmurs, rubs, gallops, 2+ pulses Lungs: Coarse rhonchi auscultated throughout, BiPAP assisted, even, mild tachypnea Abdomen: Soft, nontender, nondistended, no guarding or rebound tenderness, bowel sounds positive x4 Musculoskeletal: Normal bulk and tone, no deformities, no edema Skin: Warm and dry.  No obvious rashes, lesions, ulcerations  Recent Labs  Lab 09/17/19 0455 09/22/2019 0433 09/19/19 0548  NA 137 135 142  K 3.7 4.6 4.6  CL 98 95* 101  CO2 29 30 30   BUN 15 27* 19  CREATININE 1.91* 3.12* 2.48*  GLUCOSE 96 156* 92   Recent Labs  Lab 09/17/19 0455 09/09/2019 0433 09/19/19 0548  HGB 7.8* 7.6* 7.5*  HCT 22.6* 23.3* 23.4*  WBC 8.0 16.1* 22.9*  PLT 203 229 229   DG Chest Port 1 View  Result Date: 09/19/2019 CLINICAL DATA:  Acute respiratory failure. EXAM:  PORTABLE CHEST 1 VIEW COMPARISON:  One-view chest x-ray 09/08/2019. FINDINGS: Heart is enlarged. Diffuse interstitial and airspace disease has progressed. Progressive bilateral pulmonary opacification is noted. Right  IJ dialysis catheter is stable. IMPRESSION: Worsening diffuse interstitial and airspace disease. This likely reflects edema. Infection is not excluded. Electronically Signed   By: San Morelle M.D.   On: 09/19/2019 05:14   DG Chest Portable 1 View  Result Date: 09/05/2019 CLINICAL DATA:  Respiratory failure, ESRD on hemodialysis EXAM: PORTABLE CHEST 1 VIEW COMPARISON:  Radiograph 09/16/2019 FINDINGS: Markedly increased bilateral airspace opacities which now extensively involve the upper lungs as well as more gradient opacity towards the bases with at least moderate layering pleural effusions. No visible pneumothorax. Cardiomediastinal contours are largely obscured though the visible contours are unchanged from prior with a calcified tortuous aorta. A right IJ approach Port-A-Cath tip terminates at the approximate level of the right atrium. Telemetry leads overlie the chest. No acute osseous or soft tissue abnormality. Degenerative changes are present in the imaged spine and shoulders. IMPRESSION: Markedly increased bilateral airspace opacities which now involve the upper lungs as well as basilar gradient opacities with at least moderate layering pleural effusions. Findings could reflect worsening pulmonary edema or infection. Aortic Atherosclerosis (ICD10-I70.0). Electronically Signed   By: Lovena Le M.D.   On: 09/12/2019 04:38   ECHOCARDIOGRAM COMPLETE  Result Date: 09/22/2019    ECHOCARDIOGRAM REPORT   Patient Name:   Amber Dixon JEHUDJSHF [facility] Date of Exam: 09/06/2019 Medical Rec #:  [id number]                     Height:       59.0 in Accession #:    [id number]                    Weight:       80.7 lb Date of Birth:  Nov 10, 1950                      BSA:          1.254 m Patient Age:    35 years                      BP:           189/85 mmHg Patient Gender: F                             HR:           100 bpm. Exam Location:  [facility] Procedure: 2D Echo, Color Doppler and Cardiac Doppler  Indications:     I50.31 CHF-Acute Diastolic  History:         Patient has prior history of Echocardiogram examinations, most                  recent 03/22/2019. Risk Factors:Hypertension and Diabetes.  Sonographer:     Charmayne Sheer RDCS (AE) Referring Phys:  [id number] Bradly Bienenstock Diagnosing Phys: Ida Rogue MD  Sonographer Comments: Image acquisition challenging due to respiratory motion and Image acquisition challenging due to patient body habitus. IMPRESSIONS  1. Left ventricular ejection fraction, by estimation, is 50 to 55%. The left ventricle has low normal function. The left ventricle has no regional wall motion abnormalities. There is mild left ventricular hypertrophy. Left ventricular diastolic parameters are consistent with Grade I diastolic dysfunction (impaired relaxation).  2. Right  ventricular systolic function is normal. The right ventricular size is mildly enlarged. There is severely elevated pulmonary artery systolic pressure. The estimated right ventricular systolic pressure is 93.8 mmHg.  3. Tricuspid valve regurgitation is moderate. FINDINGS  Left Ventricle: Left ventricular ejection fraction, by estimation, is 50 to 55%. The left ventricle has low normal function. The left ventricle has no regional wall motion abnormalities. The left ventricular internal cavity size was normal in size. There is mild left ventricular hypertrophy. Left ventricular diastolic parameters are consistent with Grade I diastolic dysfunction (impaired relaxation). Right Ventricle: The right ventricular size is mildly enlarged. No increase in right ventricular wall thickness. Right ventricular systolic function is normal. There is severely elevated pulmonary artery systolic pressure. The tricuspid regurgitant velocity is 4.04 m/s, and with an assumed right atrial pressure of 10 mmHg, the estimated right ventricular systolic pressure is 18.2 mmHg. Left Atrium: Left atrial size was normal in size. Right Atrium: Right  atrial size was normal in size. Pericardium: There is no evidence of pericardial effusion. Mitral Valve: The mitral valve is normal in structure. Normal mobility of the mitral valve leaflets. Mild mitral valve regurgitation. No evidence of mitral valve stenosis. MV peak gradient, 11.0 mmHg. The mean mitral valve gradient is 6.0 mmHg. Tricuspid Valve: The tricuspid valve is normal in structure. Tricuspid valve regurgitation is moderate . No evidence of tricuspid stenosis. Aortic Valve: The aortic valve is normal in structure. Aortic valve regurgitation is not visualized. No aortic stenosis is present. Aortic valve mean gradient measures 8.0 mmHg. Aortic valve peak gradient measures 13.0 mmHg. Aortic valve area, by VTI measures 1.00 cm. Pulmonic Valve: The pulmonic valve was normal in structure. Pulmonic valve regurgitation is not visualized. No evidence of pulmonic stenosis. Aorta: The aortic root is normal in size and structure. Venous: The inferior vena cava is normal in size with greater than 50% respiratory variability, suggesting right atrial pressure of 3 mmHg. IAS/Shunts: No atrial level shunt detected by color flow Doppler.  LEFT VENTRICLE PLAX 2D LVIDd:         4.04 cm  Diastology LVIDs:         2.95 cm  LV e' lateral:   3.81 cm/s LV PW:         1.24 cm  LV E/e' lateral: 31.8 LV IVS:        0.87 cm  LV e' medial:    4.57 cm/s LVOT diam:     1.50 cm  LV E/e' medial:  26.5 LV SV:         35 LV SV Index:   28 LVOT Area:     1.77 cm  RIGHT VENTRICLE RV Basal diam:  2.92 cm LEFT ATRIUM             Index       RIGHT ATRIUM          Index LA diam:        3.70 cm 2.95 cm/m  RA Area:     9.69 cm LA Vol (A2C):   24.8 ml 19.78 ml/m RA Volume:   17.20 ml 13.72 ml/m LA Vol (A4C):   39.2 ml 31.27 ml/m LA Biplane Vol: 34.0 ml 27.12 ml/m  AORTIC VALVE                    PULMONIC VALVE AV Area (Vmax):    1.03 cm     PV Vmax:       1.16 m/s AV Area (Vmean):  0.91 cm     PV Vmean:      80.700 cm/s AV Area (VTI):      1.00 cm     PV VTI:        0.165 m AV Vmax:           180.00 cm/s  PV Peak grad:  5.4 mmHg AV Vmean:          135.000 cm/s PV Mean grad:  3.0 mmHg AV VTI:            0.348 m AV Peak Grad:      13.0 mmHg AV Mean Grad:      8.0 mmHg LVOT Vmax:         105.00 cm/s LVOT Vmean:        69.200 cm/s LVOT VTI:          0.197 m LVOT/AV VTI ratio: 0.57  AORTA Ao Root diam: 2.70 cm MITRAL VALVE                TRICUSPID VALVE MV Area (PHT): 5.54 cm     TR Peak grad:   65.3 mmHg MV Peak grad:  11.0 mmHg    TR Vmax:        404.00 cm/s MV Mean grad:  6.0 mmHg MV Vmax:       1.66 m/s     SHUNTS MV Vmean:      113.0 cm/s   Systemic VTI:  0.20 m MV Decel Time: 137 msec     Systemic Diam: 1.50 cm MV E velocity: 121.00 cm/s MV A velocity: 127.00 cm/s MV E/A ratio:  0.95 Ida Rogue MD Electronically signed by Ida Rogue MD Signature Date/Time: 09/19/2019/6:24:03 PM    Final     ASSESSMENT / PLAN:  Acute on Chronic Hypoxic Respiratory Failure in the setting of Pulmonary Edema/Volume Overload & ? Aspiration Pneumonia Hx: Chronic Hypoxic Respiratory Failure requring 2L supplemental O2 -Supplemental O2 as needed to maintain O2 saturations greater than 92% -BiPAP, wean as tolerated -High risk for intubation -Follow intermittent chest x-ray and ABG as needed -Chest x-ray on 5/27 with pulmonary edema versus pneumonia -Will give Lasix x1 dose -Emergent hemodialysis for volume removal -Place on empiric Unasyn for now  Questionable Aspiration Pneumonia in the setting of recent severe epistaxis -Monitor fever curve -Trend WBCs and procalcitonin -Follow cultures as above  -Place on empiric Unasyn for now  Hypertensive Emergency Acute on Chronic HFpEF Chest pain Hx: HTN -Continuous cardiac monitoring -Place on nitroglycerin drip to maintain SBP <160 -Volume removal with hemodialysis -Check troponin ~ 14 -EKG reassuring with no clear evidence of acute ischemia -Obtain 2D Echocardiogram  ESRD on HD (T, Th,  S) -Monitor I&O's / urinary output -Follow BMP -Ensure adequate renal perfusion -Avoid nephrotoxic agents as able -Replace electrolytes as indicated -Nephrology consulted, appreciate input -Orders placed for emergent hemodialysis by Nephrology  Anemia of chronic disease with Superimposed Recent acute blood loss anemia on 09/16/19 due to severe Epistaxis -Monitor for S/Sx of bleeding -Trend CBC -SCD's for VTE Prophylaxis (avoid chemical prophylaxis given recent severe Epistaxis)  -Transfuse for Hgb <7 -Received 1 unit pRBC on 5/25 with Hemodialysis  Diabetes Mellitus -CBG's -SSI if needed -Follow ICU Hypo/Hyperglycemia protocol           BEST PRACTICES DISPOSITION: ICU GOALS OF CARE: Full Code VTE PROPHYLAXIS: SCD's CONSULTS: Nephrology UPDATES: Updated pt and her daughter-in-law at bedside 08/24/2019   Critical care provider statement:    Critical care time (  minutes):  33   Critical care time was exclusive of:  Separately billable procedures and  treating other patients   Critical care was necessary to treat or prevent imminent or  life-threatening deterioration of the following conditions:  acute hypoxemic respiratory failure, severe protein calorie malnutrition, mutiple comorbid conditions.   Critical care was time spent personally by me on the following  activities:  Development of treatment plan with patient or surrogate,  discussions with consultants, evaluation of patient's response to  treatment, examination of patient, obtaining history from patient or  surrogate, ordering and performing treatments and interventions, ordering  and review of laboratory studies and re-evaluation of patient's condition   I assumed direction of critical care for this patient from another  provider in my specialty: no     09/19/2019, 6:38 PM  Ottie Glazier, M.D.  [facility] [place]

## 2019-09-19 NOTE — Progress Notes (Signed)
Patient trialed off of Bipap to Heated High Flow Nasal Cannula. Interpreter services was used for transition and patient education and instruction. Patient desaturated on [place] and transitioned back to Bipap on previous settings.

## 2019-09-19 NOTE — Progress Notes (Addendum)
CSW was informed by RN Myra that family is asking for a letter so that family can visit from [place] due to patient's condition.  CSW called daughter-in-law Lenna Sciara to follow-up. She reported due to patient's condition, they are wanting family from [place] to be able to visit and need a note from the MD explaining patient's medical condition as the reason why they need to coe visit from [place]. She said that patient has been asking to see these family members and they want to honor the patient's wishes.  Family members that are wanting to visit: Dorthy Cooler (daughter), Thamas Jaegers (son), and Roxana Peri Maris (granddaughter)  CSW updated MDs (Dr. Lanney Gins and Dr. Mal Misty) on request. Dr. Lanney Gins reported he will work on this for family.  Oleh Genin, [place]

## 2019-09-20 LAB — GLUCOSE, CAPILLARY
Glucose-Capillary: 82 mg/dL (ref 70–99)
Glucose-Capillary: 85 mg/dL (ref 70–99)

## 2019-09-20 MED ORDER — HYDROMORPHONE HCL 1 MG/ML IJ SOLN
INTRAMUSCULAR | Status: AC
  Filled 2019-09-20: qty 1

## 2019-09-20 MED ORDER — HYDROMORPHONE HCL 1 MG/ML IJ SOLN
0.5000 mg | Freq: Once | INTRAMUSCULAR | Status: AC
  Administered 2019-09-20: 0.5 mg via INTRAVENOUS

## 2019-09-20 MED ORDER — HALOPERIDOL LACTATE 5 MG/ML IJ SOLN
INTRAMUSCULAR | Status: AC
  Administered 2019-09-20: 2.5 mg
  Filled 2019-09-20: qty 1

## 2019-09-20 MED ORDER — MORPHINE SULFATE (PF) 2 MG/ML IV SOLN
2.0000 mg | INTRAVENOUS | Status: AC
  Administered 2019-09-20: 2 mg via INTRAVENOUS

## 2019-09-20 MED ORDER — HALOPERIDOL LACTATE 5 MG/ML IJ SOLN
2.5000 mg | INTRAMUSCULAR | Status: AC
  Administered 2019-09-20: 2.5 mg via INTRAVENOUS

## 2019-09-20 MED ORDER — MORPHINE 100MG IN NS 100ML (1MG/ML) PREMIX INFUSION: 0.0000 mg/h | INTRAVENOUS | Status: DC

## 2019-09-23 NOTE — Progress Notes (Signed)
CDS referral made. Referral [phone].

## 2019-09-23 NOTE — Death Summary Note (Signed)
DEATH SUMMARY    Name: Amber Dixon MRN: [id number] DOB: [birth year]    ADMISSION DATE:  09/17/2019 CONSULTATION DATE:  09/01/2019  REFERRING MD :  Dr. Karma Greaser  CHIEF COMPLAINT:  Acute Respiratory Distress   BRIEF PATIENT DESCRIPTION:  69 y.o. Female with PMH of ESRD on HD (T,Th,S) and HFpEF admitted 08/28/2019 with Acute on Chronic Hypoxic Respiratory Failure in the setting of Pulmonary Edema/Volume Overload & questionable Aspiration Pneumonia (severe Epistaxis 5/25), Hypertensive Emergency requiring Nitroglycerin drip and emergent HD.  SIGNIFICANT EVENTS  5/27: Presents to ED; emergent HD, PCCM consulted for admission  September 30, 2019 -[place]    Family conference held today in the presence of translator Rafael.  Present with multiple family members including son and grandson another son as well as daughter-in-law.  We had discussed her hospital course as well as comorbid conditions.  One of the sons was actually here overnight and had witnessed patient going to respiratory distress despite entire ICU team attempting to help patient due to severe agitation, encephalopathy and respiratory failure.  They do understand that she has had progressive decline in context of ESRD with recurrent pulmonary edema despite full compliance with dialysis as well as acute blood loss anemia, heart failure and cachexia.  Family's wishes are to de-escalate care after full discussion of her ongoing clinical decline.  CODE STATUS has been changed to comfort measures.  Patient was liberated from noninvasive ventilation.  She passed away at 1321.  Sep 21, 2022 she rest in peace.  STUDIES:  5/27: CXR>>Markedly increased bilateral airspace opacities which now involve the upper lungs as well as basilar gradient opacities with at least moderate layering pleural effusions. Findings could reflect worsening pulmonary edema or infection. 5/27: 2D Echocardiogram>>  CULTURES: SARS-CoV-2 PCR 5/25>>  Negative Sputum 5/27>>  ANTIBIOTICS: Unasyn 5/27>>  HISTORY OF PRESENT ILLNESS:   Abe People is a 69 year old female with a past medical history significant for end-stage renal disease on hemodialysis (T,Th,S), HFpEF, hypertension, diabetes mellitus, anemia of chronic disease who presents to [facility] ED on 09/17/2019 with acute respiratory distress.  Of note she was hospitalized recently on 09/16/2019 and discharged yesterday on 09/17/2019 due to acute blood loss anemia in the setting of severe epistaxis.  During that admission she received 1 unit of packed red blood cells with hemodialysis.  She was discharged home with no respiratory distress.  Yesterday evening she was straining to have a bowel movement due to chronic constipation, and after straining in the bathroom began to feel short of breath with audible wheezing.  She went to sleep but woke up in severe respiratory distress this morning.  She was noted to be hypoxic with O2 saturations in the low 70s on her home baseline of 2 L supplemental oxygen.  Upon arrival to the ED she was noted to be in severe respiratory distress of which she was placed on BiPAP.  She was also noted to be significantly hypertensive with blood pressure 197/88.  Initial work-up reveals WBC 16.1 (with neutrophilia and lymphocytopenia), hemoglobin 7.6, BNP 3360, high-sensitivity troponin 14, BUN 27, creatinine 3.12, glucose 156.  Chest x-ray with increased bilateral airspace opacities involving both the upper and basilar fields concerning for pulmonary edema versus pneumonia.  Procalcitonin is currently pending.  Her SARS-CoV-2 PCR was negative on 09/16/2019.  ED provider notified Dr. Candiss Norse with Nephrology who has ordered for emergent hemodialysis.  She was also placed on nitroglycerin infusion for hypertensive emergency. PCCM is asked to admit the patient for further work-up and treatment  of acute on chronic hypoxic respiratory failure secondary to pulmonary  edema/volume overload and questionable aspiration pneumonia requiring BiPAP, along with hypertensive emergency requiring nitroglycerin infusion..   09/19/19-grandson is at bedside we discussed care plan and answered questions.  Patient was severely encephalopathic and agitated today despite multiple doses of Ativan.  She is DNR but is unable to protect her airway there is concern for aspiration.  At this time patient will require Precedex drip due to severe agitation and risk for harm to self/falling out of bed. 10/08/19 - Family meeting today - present is Translator(Rafael) as well as 4 family members (son, grandson, second son, daughter in law).  We reivewed hospital course, clincal condition and continued progressive decline. Family agree to comfort care measure.   PAST MEDICAL HISTORY :   has a past medical history of Diabetes mellitus without complication ([place]), Hypertension, and Renal disorder.  has a past surgical history that includes Leg Surgery; IR US Guide Vasc Access Right (04/02/2019); IR Fluoro Guide CV Line Right (04/02/2019); IR Fluoro Guide CV Line Right (04/13/2019); AV fistula placement (Right, 04/24/2019); AV fistula placement (Right); and Hip fracture surgery (Right). Prior to Admission medications   Medication Sig Start Date End Date Taking? Authorizing Provider  acetaminophen (TYLENOL) 325 MG tablet Take 650 mg by mouth every 6 (six) hours as needed for mild pain or fever.     [provider]  amLODipine (NORVASC) 5 MG tablet Take 1 tablet (5 mg total) by mouth daily. 09/06/19   Danford, Suann Larry, MD  carvedilol (COREG) 6.25 MG tablet Take 1 tablet (6.25 mg total) by mouth 2 (two) times daily with a meal. 09/09/19 12/08/19  Enzo Bi, MD  feeding supplement, GLUCERNA SHAKE, (GLUCERNA SHAKE) LIQD Take 237 mLs by mouth 3 (three) times daily between meals. 09/09/19   Enzo Bi, MD  furosemide (LASIX) 80 MG tablet Take 1 tablet (80 mg total) by mouth every other day. 09/06/19    Danford, Suann Larry, MD   No Known Allergies  FAMILY HISTORY:  family history is not on file. SOCIAL HISTORY:  reports that she has never smoked. She has never used smokeless tobacco. She reports previous drug use.     REVIEW OF SYSTEMS:  Positives in BOLD Constitutional: Negative for fever, +chills, weight loss, malaise/fatigue and diaphoresis.  HENT: Negative for hearing loss, ear pain, nosebleeds, congestion, sore throat, neck pain, tinnitus and ear discharge.   Eyes: Negative for blurred vision, double vision, photophobia, pain, discharge and redness.  Respiratory: Negative for +cough, hemoptysis, sputum production, +shortness of breath, wheezing and stridor.   Cardiovascular: Negative for +chest discomfort, palpitations, orthopnea, claudication, leg swelling and PND.  Gastrointestinal: Negative for heartburn, nausea, vomiting, abdominal pain, diarrhea, constipation, blood in stool and melena.  Genitourinary: Negative for dysuria, urgency, frequency, hematuria and flank pain.  Musculoskeletal: Negative for myalgias, back pain, joint pain and falls.  Skin: Negative for itching and rash.  Neurological: Negative for +dizziness, tingling, tremors, sensory change, speech change, focal weakness, seizures, loss of consciousness, weakness and headaches.  Endo/Heme/Allergies: Negative for environmental allergies and polydipsia. Does not bruise/bleed easily.  SUBJECTIVE:  Pt reports dizziness, chest discomfort, SOB, dry cough, and chills Denies palpitations, wheezing, Abdominal pain, N/V/D, dysuria On BiPAP, appears comfortable  VITAL SIGNS: Temp:  [98 F (36.7 C)-98.2 F (36.8 C)] 98 F (36.7 C) (05/29 0800) Pulse Rate:  [67-98] 68 (05/29 1200) Resp:  [0-28] 0 (05/29 1311) BP: (126-175)/(57-73) 153/66 (05/29 1000) SpO2:  [50 %-100 %] 62 % (  05/29 1200) FiO2 (%):  [94 %-100 %] 100 % (05/29 0400)  PHYSICAL EXAMINATION: General: Acute on chronically ill-appearing female, laying in  bed, on BiPAP, no acute distress Neuro: Awake, alert and oriented x4, follows commands, no focal deficits, speech clear HEENT: Atraumatic, normocephalic, neck supple, positive JVD Cardiovascular: Regular rate and rhythm, S1-S2, no murmurs, rubs, gallops, 2+ pulses Lungs: Coarse rhonchi auscultated throughout, BiPAP assisted, even, mild tachypnea Abdomen: Soft, nontender, nondistended, no guarding or rebound tenderness, bowel sounds positive x4 Musculoskeletal: Normal bulk and tone, no deformities, no edema Skin: Warm and dry.  No obvious rashes, lesions, ulcerations  Recent Labs  Lab 09/17/19 0455 09/04/2019 0433 09/19/19 0548  NA 137 135 142  K 3.7 4.6 4.6  CL 98 95* 101  CO2 29 30 30   BUN 15 27* 19  CREATININE 1.91* 3.12* 2.48*  GLUCOSE 96 156* 92   Recent Labs  Lab 09/17/19 0455 09/14/2019 0433 09/19/19 0548  HGB 7.8* 7.6* 7.5*  HCT 22.6* 23.3* 23.4*  WBC 8.0 16.1* 22.9*  PLT 203 229 229   DG Chest Port 1 View  Result Date: 09/19/2019 CLINICAL DATA:  Acute respiratory failure. EXAM: PORTABLE CHEST 1 VIEW COMPARISON:  One-view chest x-ray 08/31/2019. FINDINGS: Heart is enlarged. Diffuse interstitial and airspace disease has progressed. Progressive bilateral pulmonary opacification is noted. Right IJ dialysis catheter is stable. IMPRESSION: Worsening diffuse interstitial and airspace disease. This likely reflects edema. Infection is not excluded. Electronically Signed   By: San Morelle M.D.   On: 09/19/2019 05:14   ECHOCARDIOGRAM COMPLETE  Result Date: 09/12/2019    ECHOCARDIOGRAM REPORT   Patient Name:   Marlene Lard [facility] Date of Exam: 09/07/2019 Medical Rec #:  [id number]                     Height:       59.0 in Accession #:    [id number]                    Weight:       80.7 lb Date of Birth:  02/11/1951                      BSA:          1.254 m Patient Age:    32 years                      BP:           189/85 mmHg Patient Gender: F                              HR:           100 bpm. Exam Location:  [facility] Procedure: 2D Echo, Color Doppler and Cardiac Doppler Indications:     I50.31 CHF-Acute Diastolic  History:         Patient has prior history of Echocardiogram examinations, most                  recent 03/22/2019. Risk Factors:Hypertension and Diabetes.  Sonographer:     Charmayne Sheer RDCS (AE) Referring Phys:  [id number] Bradly Bienenstock Diagnosing Phys: Ida Rogue MD  Sonographer Comments: Image acquisition challenging due to respiratory motion and Image acquisition challenging due to patient body habitus. IMPRESSIONS  1. Left ventricular ejection fraction, by estimation, is 50 to 55%. The left ventricle has  low normal function. The left ventricle has no regional wall motion abnormalities. There is mild left ventricular hypertrophy. Left ventricular diastolic parameters are consistent with Grade I diastolic dysfunction (impaired relaxation).  2. Right ventricular systolic function is normal. The right ventricular size is mildly enlarged. There is severely elevated pulmonary artery systolic pressure. The estimated right ventricular systolic pressure is 81.1 mmHg.  3. Tricuspid valve regurgitation is moderate. FINDINGS  Left Ventricle: Left ventricular ejection fraction, by estimation, is 50 to 55%. The left ventricle has low normal function. The left ventricle has no regional wall motion abnormalities. The left ventricular internal cavity size was normal in size. There is mild left ventricular hypertrophy. Left ventricular diastolic parameters are consistent with Grade I diastolic dysfunction (impaired relaxation). Right Ventricle: The right ventricular size is mildly enlarged. No increase in right ventricular wall thickness. Right ventricular systolic function is normal. There is severely elevated pulmonary artery systolic pressure. The tricuspid regurgitant velocity is 4.04 m/s, and with an assumed right atrial pressure of 10 mmHg, the estimated right ventricular  systolic pressure is 91.4 mmHg. Left Atrium: Left atrial size was normal in size. Right Atrium: Right atrial size was normal in size. Pericardium: There is no evidence of pericardial effusion. Mitral Valve: The mitral valve is normal in structure. Normal mobility of the mitral valve leaflets. Mild mitral valve regurgitation. No evidence of mitral valve stenosis. MV peak gradient, 11.0 mmHg. The mean mitral valve gradient is 6.0 mmHg. Tricuspid Valve: The tricuspid valve is normal in structure. Tricuspid valve regurgitation is moderate . No evidence of tricuspid stenosis. Aortic Valve: The aortic valve is normal in structure. Aortic valve regurgitation is not visualized. No aortic stenosis is present. Aortic valve mean gradient measures 8.0 mmHg. Aortic valve peak gradient measures 13.0 mmHg. Aortic valve area, by VTI measures 1.00 cm. Pulmonic Valve: The pulmonic valve was normal in structure. Pulmonic valve regurgitation is not visualized. No evidence of pulmonic stenosis. Aorta: The aortic root is normal in size and structure. Venous: The inferior vena cava is normal in size with greater than 50% respiratory variability, suggesting right atrial pressure of 3 mmHg. IAS/Shunts: No atrial level shunt detected by color flow Doppler.  LEFT VENTRICLE PLAX 2D LVIDd:         4.04 cm  Diastology LVIDs:         2.95 cm  LV e' lateral:   3.81 cm/s LV PW:         1.24 cm  LV E/e' lateral: 31.8 LV IVS:        0.87 cm  LV e' medial:    4.57 cm/s LVOT diam:     1.50 cm  LV E/e' medial:  26.5 LV SV:         35 LV SV Index:   28 LVOT Area:     1.77 cm  RIGHT VENTRICLE RV Basal diam:  2.92 cm LEFT ATRIUM             Index       RIGHT ATRIUM          Index LA diam:        3.70 cm 2.95 cm/m  RA Area:     9.69 cm LA Vol (A2C):   24.8 ml 19.78 ml/m RA Volume:   17.20 ml 13.72 ml/m LA Vol (A4C):   39.2 ml 31.27 ml/m LA Biplane Vol: 34.0 ml 27.12 ml/m  AORTIC VALVE  PULMONIC VALVE AV Area (Vmax):    1.03 cm      PV Vmax:       1.16 m/s AV Area (Vmean):   0.91 cm     PV Vmean:      80.700 cm/s AV Area (VTI):     1.00 cm     PV VTI:        0.165 m AV Vmax:           180.00 cm/s  PV Peak grad:  5.4 mmHg AV Vmean:          135.000 cm/s PV Mean grad:  3.0 mmHg AV VTI:            0.348 m AV Peak Grad:      13.0 mmHg AV Mean Grad:      8.0 mmHg LVOT Vmax:         105.00 cm/s LVOT Vmean:        69.200 cm/s LVOT VTI:          0.197 m LVOT/AV VTI ratio: 0.57  AORTA Ao Root diam: 2.70 cm MITRAL VALVE                TRICUSPID VALVE MV Area (PHT): 5.54 cm     TR Peak grad:   65.3 mmHg MV Peak grad:  11.0 mmHg    TR Vmax:        404.00 cm/s MV Mean grad:  6.0 mmHg MV Vmax:       1.66 m/s     SHUNTS MV Vmean:      113.0 cm/s   Systemic VTI:  0.20 m MV Decel Time: 137 msec     Systemic Diam: 1.50 cm MV E velocity: 121.00 cm/s MV A velocity: 127.00 cm/s MV E/A ratio:  0.95 Ida Rogue MD Electronically signed by Ida Rogue MD Signature Date/Time: 09/12/2019/6:24:03 PM    Final     ASSESSMENT / PLAN:  Acute on Chronic Hypoxic Respiratory Failure in the setting of Pulmonary Edema/Volume Overload & ? Aspiration Pneumonia Hx: Chronic Hypoxic Respiratory Failure requring 2L supplemental O2 -Supplemental O2 as needed to maintain O2 saturations greater than 92% -stopping aggressive measure due to comfort care  Questionable Aspiration Pneumonia in the setting of recent severe epistaxis -Monitor fever curve -Trend WBCs and procalcitonin -Follow cultures as above  -Place on empiric Unasyn for now  Hypertensive Emergency Acute on Chronic HFpEF Chest pain Hx: HTN -Continuous cardiac monitoring -comfort care only   ESRD on HD (T, Th, S) -discussed with nephrology - comfort care only    Anemia of chronic disease with Superimposed Recent acute blood loss anemia on 09/16/19 due to severe Epistaxis -Monitor for S/Sx of bleeding -Trend CBC -SCD's for VTE Prophylaxis (avoid chemical prophylaxis given recent severe  Epistaxis)  -Transfuse for Hgb <7 -Received 1 unit pRBC on 5/25 with Hemodialysis  Diabetes Mellitus -CBG's -SSI if needed -Follow ICU Hypo/Hyperglycemia protocol     BEST PRACTICES DISPOSITION: ICU GOALS OF CARE: Full Code VTE PROPHYLAXIS: SCD's CONSULTS: Nephrology UPDATES: Updated pt and her daughter-in-law at bedside 08/23/2019     10-11-2019, 1:38 PM  Ottie Glazier, M.D.  Pulmonary & [place]

## 2019-09-23 NOTE — Progress Notes (Signed)
Name: Amber Dixon MRN: [id number] DOB: 1950/04/29    ADMISSION DATE:  08/28/2019 CONSULTATION DATE:  09/13/2019  REFERRING MD :  Dr. Karma Greaser  CHIEF COMPLAINT:  Acute Respiratory Distress   BRIEF PATIENT DESCRIPTION:  69 y.o. Female with PMH of ESRD on HD (T,Th,S) and HFpEF admitted 09/03/2019 with Acute on Chronic Hypoxic Respiratory Failure in the setting of Pulmonary Edema/Volume Overload & questionable Aspiration Pneumonia (severe Epistaxis 5/25), Hypertensive Emergency requiring Nitroglycerin drip and emergent HD.  SIGNIFICANT EVENTS  5/27: Presents to ED; emergent HD, PCCM consulted for admission  STUDIES:  5/27: CXR>>Markedly increased bilateral airspace opacities which now involve the upper lungs as well as basilar gradient opacities with at least moderate layering pleural effusions. Findings could reflect worsening pulmonary edema or infection. 5/27: 2D Echocardiogram>>  CULTURES: SARS-CoV-2 PCR 5/25>> Negative Sputum 5/27>>  ANTIBIOTICS: Unasyn 5/27>>  HISTORY OF PRESENT ILLNESS:   Amber Dixon is a 69 year old female with a past medical history significant for end-stage renal disease on hemodialysis (T,Th,S), HFpEF, hypertension, diabetes mellitus, anemia of chronic disease who presents to [facility] ED on 08/26/2019 with acute respiratory distress.  Of note she was hospitalized recently on 09/16/2019 and discharged yesterday on 09/17/2019 due to acute blood loss anemia in the setting of severe epistaxis.  During that admission she received 1 unit of packed red blood cells with hemodialysis.  She was discharged home with no respiratory distress.  Yesterday evening she was straining to have a bowel movement due to chronic constipation, and after straining in the bathroom began to feel short of breath with audible wheezing.  She went to sleep but woke up in severe respiratory distress this morning.  She was noted to be hypoxic with O2 saturations in the low 70s  on her home baseline of 2 L supplemental oxygen.  Upon arrival to the ED she was noted to be in severe respiratory distress of which she was placed on BiPAP.  She was also noted to be significantly hypertensive with blood pressure 197/88.  Initial work-up reveals WBC 16.1 (with neutrophilia and lymphocytopenia), hemoglobin 7.6, BNP 3360, high-sensitivity troponin 14, BUN 27, creatinine 3.12, glucose 156.  Chest x-ray with increased bilateral airspace opacities involving both the upper and basilar fields concerning for pulmonary edema versus pneumonia.  Procalcitonin is currently pending.  Her SARS-CoV-2 PCR was negative on 09/16/2019.  ED provider notified Dr. Candiss Norse with Nephrology who has ordered for emergent hemodialysis.  She was also placed on nitroglycerin infusion for hypertensive emergency. PCCM is asked to admit the patient for further work-up and treatment of acute on chronic hypoxic respiratory failure secondary to pulmonary edema/volume overload and questionable aspiration pneumonia requiring BiPAP, along with hypertensive emergency requiring nitroglycerin infusion..   09/19/19-grandson is at bedside we discussed care plan and answered questions.  Patient was severely encephalopathic and agitated today despite multiple doses of Ativan.  She is DNR but is unable to protect her airway there is concern for aspiration.  At this time patient will require Precedex drip due to severe agitation and risk for harm to self/falling out of bed. 09/28/2019 - Family meeting today - present is Translator(Rafael) as well as 4 family members (son, grandson, second son, daughter in law).  We reivewed hospital course, clincal condition and continued progressive decline. Family agree to comfort care measure.   PAST MEDICAL HISTORY :   has a past medical history of Diabetes mellitus without complication ([place]), Hypertension, and Renal disorder.  has a past surgical history that includes  Leg Surgery; IR US Guide Vasc  Access Right (04/02/2019); IR Fluoro Guide CV Line Right (04/02/2019); IR Fluoro Guide CV Line Right (04/13/2019); AV fistula placement (Right, 04/24/2019); AV fistula placement (Right); and Hip fracture surgery (Right). Prior to Admission medications   Medication Sig Start Date End Date Taking? Authorizing Provider  acetaminophen (TYLENOL) 325 MG tablet Take 650 mg by mouth every 6 (six) hours as needed for mild pain or fever.     [provider]  amLODipine (NORVASC) 5 MG tablet Take 1 tablet (5 mg total) by mouth daily. 09/06/19   Danford, Suann Larry, MD  carvedilol (COREG) 6.25 MG tablet Take 1 tablet (6.25 mg total) by mouth 2 (two) times daily with a meal. 09/09/19 12/08/19  Enzo Bi, MD  feeding supplement, GLUCERNA SHAKE, (GLUCERNA SHAKE) LIQD Take 237 mLs by mouth 3 (three) times daily between meals. 09/09/19   Enzo Bi, MD  furosemide (LASIX) 80 MG tablet Take 1 tablet (80 mg total) by mouth every other day. 09/06/19   Danford, Suann Larry, MD   No Known Allergies  FAMILY HISTORY:  family history is not on file. SOCIAL HISTORY:  reports that she has never smoked. She has never used smokeless tobacco. She reports previous drug use.     REVIEW OF SYSTEMS:  Positives in BOLD Constitutional: Negative for fever, +chills, weight loss, malaise/fatigue and diaphoresis.  HENT: Negative for hearing loss, ear pain, nosebleeds, congestion, sore throat, neck pain, tinnitus and ear discharge.   Eyes: Negative for blurred vision, double vision, photophobia, pain, discharge and redness.  Respiratory: Negative for +cough, hemoptysis, sputum production, +shortness of breath, wheezing and stridor.   Cardiovascular: Negative for +chest discomfort, palpitations, orthopnea, claudication, leg swelling and PND.  Gastrointestinal: Negative for heartburn, nausea, vomiting, abdominal pain, diarrhea, constipation, blood in stool and melena.  Genitourinary: Negative for dysuria, urgency, frequency,  hematuria and flank pain.  Musculoskeletal: Negative for myalgias, back pain, joint pain and falls.  Skin: Negative for itching and rash.  Neurological: Negative for +dizziness, tingling, tremors, sensory change, speech change, focal weakness, seizures, loss of consciousness, weakness and headaches.  Endo/Heme/Allergies: Negative for environmental allergies and polydipsia. Does not bruise/bleed easily.  SUBJECTIVE:  Pt reports dizziness, chest discomfort, SOB, dry cough, and chills Denies palpitations, wheezing, Abdominal pain, N/V/D, dysuria On BiPAP, appears comfortable  VITAL SIGNS: Temp:  [98 F (36.7 C)-98.2 F (36.8 C)] 98 F (36.7 C) (05/29 0800) Pulse Rate:  [73-98] 73 (05/29 1000) Resp:  [8-28] 12 (05/29 1000) BP: (107-175)/(57-73) 153/66 (05/29 1000) SpO2:  [51 %-100 %] 100 % (05/29 1000) FiO2 (%):  [94 %-100 %] 100 % (05/29 0400)  PHYSICAL EXAMINATION: General: Acute on chronically ill-appearing female, laying in bed, on BiPAP, no acute distress Neuro: Awake, alert and oriented x4, follows commands, no focal deficits, speech clear HEENT: Atraumatic, normocephalic, neck supple, positive JVD Cardiovascular: Regular rate and rhythm, S1-S2, no murmurs, rubs, gallops, 2+ pulses Lungs: Coarse rhonchi auscultated throughout, BiPAP assisted, even, mild tachypnea Abdomen: Soft, nontender, nondistended, no guarding or rebound tenderness, bowel sounds positive x4 Musculoskeletal: Normal bulk and tone, no deformities, no edema Skin: Warm and dry.  No obvious rashes, lesions, ulcerations  Recent Labs  Lab 09/17/19 0455 09/07/2019 0433 09/19/19 0548  NA 137 135 142  K 3.7 4.6 4.6  CL 98 95* 101  CO2 29 30 30   BUN 15 27* 19  CREATININE 1.91* 3.12* 2.48*  GLUCOSE 96 156* 92   Recent Labs  Lab 09/17/19 0455 08/26/2019 0433  09/19/19 0548  HGB 7.8* 7.6* 7.5*  HCT 22.6* 23.3* 23.4*  WBC 8.0 16.1* 22.9*  PLT 203 229 229   DG Chest Port 1 View  Result Date:  09/19/2019 CLINICAL DATA:  Acute respiratory failure. EXAM: PORTABLE CHEST 1 VIEW COMPARISON:  One-view chest x-ray 09/12/2019. FINDINGS: Heart is enlarged. Diffuse interstitial and airspace disease has progressed. Progressive bilateral pulmonary opacification is noted. Right IJ dialysis catheter is stable. IMPRESSION: Worsening diffuse interstitial and airspace disease. This likely reflects edema. Infection is not excluded. Electronically Signed   By: San Morelle M.D.   On: 09/19/2019 05:14   ECHOCARDIOGRAM COMPLETE  Result Date: 09/02/2019    ECHOCARDIOGRAM REPORT   Patient Name:   Marlene Lard [id number] [facility] Date of Exam: 08/31/2019 Medical Rec #:  [id number]                     Height:       59.0 in Accession #:    [id number]                    Weight:       80.7 lb Date of Birth:  03/26/1951                      BSA:          1.254 m Patient Age:    58 years                      BP:           189/85 mmHg Patient Gender: F                             HR:           100 bpm. Exam Location:  [facility] Procedure: 2D Echo, Color Doppler and Cardiac Doppler Indications:     I50.31 CHF-Acute Diastolic  History:         Patient has prior history of Echocardiogram examinations, most                  recent 03/22/2019. Risk Factors:Hypertension and Diabetes.  Sonographer:     Charmayne Sheer RDCS (AE) Referring Phys:  [id number] Bradly Bienenstock Diagnosing Phys: Ida Rogue MD  Sonographer Comments: Image acquisition challenging due to respiratory motion and Image acquisition challenging due to patient body habitus. IMPRESSIONS  1. Left ventricular ejection fraction, by estimation, is 50 to 55%. The left ventricle has low normal function. The left ventricle has no regional wall motion abnormalities. There is mild left ventricular hypertrophy. Left ventricular diastolic parameters are consistent with Grade I diastolic dysfunction (impaired relaxation).  2. Right ventricular systolic function is normal. The right  ventricular size is mildly enlarged. There is severely elevated pulmonary artery systolic pressure. The estimated right ventricular systolic pressure is 14.4 mmHg.  3. Tricuspid valve regurgitation is moderate. FINDINGS  Left Ventricle: Left ventricular ejection fraction, by estimation, is 50 to 55%. The left ventricle has low normal function. The left ventricle has no regional wall motion abnormalities. The left ventricular internal cavity size was normal in size. There is mild left ventricular hypertrophy. Left ventricular diastolic parameters are consistent with Grade I diastolic dysfunction (impaired relaxation). Right Ventricle: The right ventricular size is mildly enlarged. No increase in right ventricular wall thickness. Right ventricular systolic function is normal. There is severely elevated pulmonary artery systolic pressure.  The tricuspid regurgitant velocity is 4.04 m/s, and with an assumed right atrial pressure of 10 mmHg, the estimated right ventricular systolic pressure is 50.3 mmHg. Left Atrium: Left atrial size was normal in size. Right Atrium: Right atrial size was normal in size. Pericardium: There is no evidence of pericardial effusion. Mitral Valve: The mitral valve is normal in structure. Normal mobility of the mitral valve leaflets. Mild mitral valve regurgitation. No evidence of mitral valve stenosis. MV peak gradient, 11.0 mmHg. The mean mitral valve gradient is 6.0 mmHg. Tricuspid Valve: The tricuspid valve is normal in structure. Tricuspid valve regurgitation is moderate . No evidence of tricuspid stenosis. Aortic Valve: The aortic valve is normal in structure. Aortic valve regurgitation is not visualized. No aortic stenosis is present. Aortic valve mean gradient measures 8.0 mmHg. Aortic valve peak gradient measures 13.0 mmHg. Aortic valve area, by VTI measures 1.00 cm. Pulmonic Valve: The pulmonic valve was normal in structure. Pulmonic valve regurgitation is not visualized. No evidence  of pulmonic stenosis. Aorta: The aortic root is normal in size and structure. Venous: The inferior vena cava is normal in size with greater than 50% respiratory variability, suggesting right atrial pressure of 3 mmHg. IAS/Shunts: No atrial level shunt detected by color flow Doppler.  LEFT VENTRICLE PLAX 2D LVIDd:         4.04 cm  Diastology LVIDs:         2.95 cm  LV e' lateral:   3.81 cm/s LV PW:         1.24 cm  LV E/e' lateral: 31.8 LV IVS:        0.87 cm  LV e' medial:    4.57 cm/s LVOT diam:     1.50 cm  LV E/e' medial:  26.5 LV SV:         35 LV SV Index:   28 LVOT Area:     1.77 cm  RIGHT VENTRICLE RV Basal diam:  2.92 cm LEFT ATRIUM             Index       RIGHT ATRIUM          Index LA diam:        3.70 cm 2.95 cm/m  RA Area:     9.69 cm LA Vol (A2C):   24.8 ml 19.78 ml/m RA Volume:   17.20 ml 13.72 ml/m LA Vol (A4C):   39.2 ml 31.27 ml/m LA Biplane Vol: 34.0 ml 27.12 ml/m  AORTIC VALVE                    PULMONIC VALVE AV Area (Vmax):    1.03 cm     PV Vmax:       1.16 m/s AV Area (Vmean):   0.91 cm     PV Vmean:      80.700 cm/s AV Area (VTI):     1.00 cm     PV VTI:        0.165 m AV Vmax:           180.00 cm/s  PV Peak grad:  5.4 mmHg AV Vmean:          135.000 cm/s PV Mean grad:  3.0 mmHg AV VTI:            0.348 m AV Peak Grad:      13.0 mmHg AV Mean Grad:      8.0 mmHg LVOT Vmax:  105.00 cm/s LVOT Vmean:        69.200 cm/s LVOT VTI:          0.197 m LVOT/AV VTI ratio: 0.57  AORTA Ao Root diam: 2.70 cm MITRAL VALVE                TRICUSPID VALVE MV Area (PHT): 5.54 cm     TR Peak grad:   65.3 mmHg MV Peak grad:  11.0 mmHg    TR Vmax:        404.00 cm/s MV Mean grad:  6.0 mmHg MV Vmax:       1.66 m/s     SHUNTS MV Vmean:      113.0 cm/s   Systemic VTI:  0.20 m MV Decel Time: 137 msec     Systemic Diam: 1.50 cm MV E velocity: 121.00 cm/s MV A velocity: 127.00 cm/s MV E/A ratio:  0.95 Ida Rogue MD Electronically signed by Ida Rogue MD Signature Date/Time: 09/05/2019/6:24:03 PM     Final     ASSESSMENT / PLAN:  Acute on Chronic Hypoxic Respiratory Failure in the setting of Pulmonary Edema/Volume Overload & ? Aspiration Pneumonia Hx: Chronic Hypoxic Respiratory Failure requring 2L supplemental O2 -Supplemental O2 as needed to maintain O2 saturations greater than 92% -stopping aggressive measure due to comfort care  Questionable Aspiration Pneumonia in the setting of recent severe epistaxis -Monitor fever curve -Trend WBCs and procalcitonin -Follow cultures as above  -Place on empiric Unasyn for now  Hypertensive Emergency Acute on Chronic HFpEF Chest pain Hx: HTN -Continuous cardiac monitoring -comfort care only   ESRD on HD (T, Th, S) -discussed with nephrology - comfort care only    Anemia of chronic disease with Superimposed Recent acute blood loss anemia on 09/16/19 due to severe Epistaxis -Monitor for S/Sx of bleeding -Trend CBC -SCD's for VTE Prophylaxis (avoid chemical prophylaxis given recent severe Epistaxis)  -Transfuse for Hgb <7 -Received 1 unit pRBC on 5/25 with Hemodialysis  Diabetes Mellitus -CBG's -SSI if needed -Follow ICU Hypo/Hyperglycemia protocol     BEST PRACTICES DISPOSITION: ICU GOALS OF CARE: Full Code VTE PROPHYLAXIS: SCD's CONSULTS: Nephrology UPDATES: Updated pt and her daughter-in-law at bedside 09/14/2019   Critical care provider statement:    Critical care time (minutes):  33   Critical care time was exclusive of:  Separately billable procedures and  treating other patients   Critical care was necessary to treat or prevent imminent or  life-threatening deterioration of the following conditions:  acute hypoxemic respiratory failure, severe protein calorie malnutrition, mutiple comorbid conditions.   Critical care was time spent personally by me on the following  activities:  Development of treatment plan with patient or surrogate,  discussions with consultants, evaluation of patient's response to  treatment,  examination of patient, obtaining history from patient or  surrogate, ordering and performing treatments and interventions, ordering  and review of laboratory studies and re-evaluation of patient's condition   I assumed direction of critical care for this patient from another  provider in my specialty: no     2019-09-23, 11:53 AM  Ottie Glazier, M.D.  Pulmonary & [place]

## 2019-09-23 NOTE — Progress Notes (Signed)
TOD 1311. Dr. Lanney Gins notified. Family at bedside.

## 2019-09-23 NOTE — Progress Notes (Signed)
Notified by RN pt agitated; pulled Bipap mask off her face; and broke the mask in pieces despite precedex gtt infusing @1 .2 mcg/kg/hr.  Following these events pt became severely hypoxic with O2 sats dropping to the 70's. Bipap mask replaced with pts O2 sats increasing to the 90's, however she remained severely agitated and tachypneic.  Due to agitation the pt received 2.5 mg of iv haldol x1 dose without improvement of symptoms.  Therefore, pt received an additional 2 mg of iv morphine but continued to moan and thrash in the bed. Pt able to nod yes when asked if she was in pain. Therefore, she received 0.5 mg iv dilaudid x1 dose and precedex gtt currently infusing.  Pt now resting comfortably in bed and does not appear to be in pain.  Pts son at bedside throughout the night, and remained at bedside during decline in pts condition.  I discussed pt condition and he stated he understands pt is suffering and is HIGH risk for death.  I asked pts son to call his brother and his brothers wife to see if they would come to the hospital today to discuss goals of treatment and plan of care.  Pts son told me the rest of the family can arrive at bedside at 09:00 am today, family meeting has been scheduled for today 2019/09/29 @09 :00 am with pts 2 sons and daughter-in-law.  Will continue to monitor and assess pt.     Marda Stalker, [place] Pager [phone] (please enter 7 digits) PCCM Consult Pager [phone] (please enter 7 digits) a

## 2019-09-23 DEATH — deceased

## 2019-09-24 ENCOUNTER — Ambulatory Visit: Payer: Self-pay | Admitting: Family

## 2019-10-01 ENCOUNTER — Ambulatory Visit: Payer: Self-pay | Admitting: Family

## 2021-10-23 IMAGING — DX DG CHEST 1V
1 series · 1 of 1 positions shown · non-contrast
Comparison: Radiograph dated 08/19/2019.

CLINICAL DATA: 68-year-old female with chest and abdominal pain.

EXAM:
CHEST  1 VIEW; ABDOMEN - 1 VIEW

[chest ap]
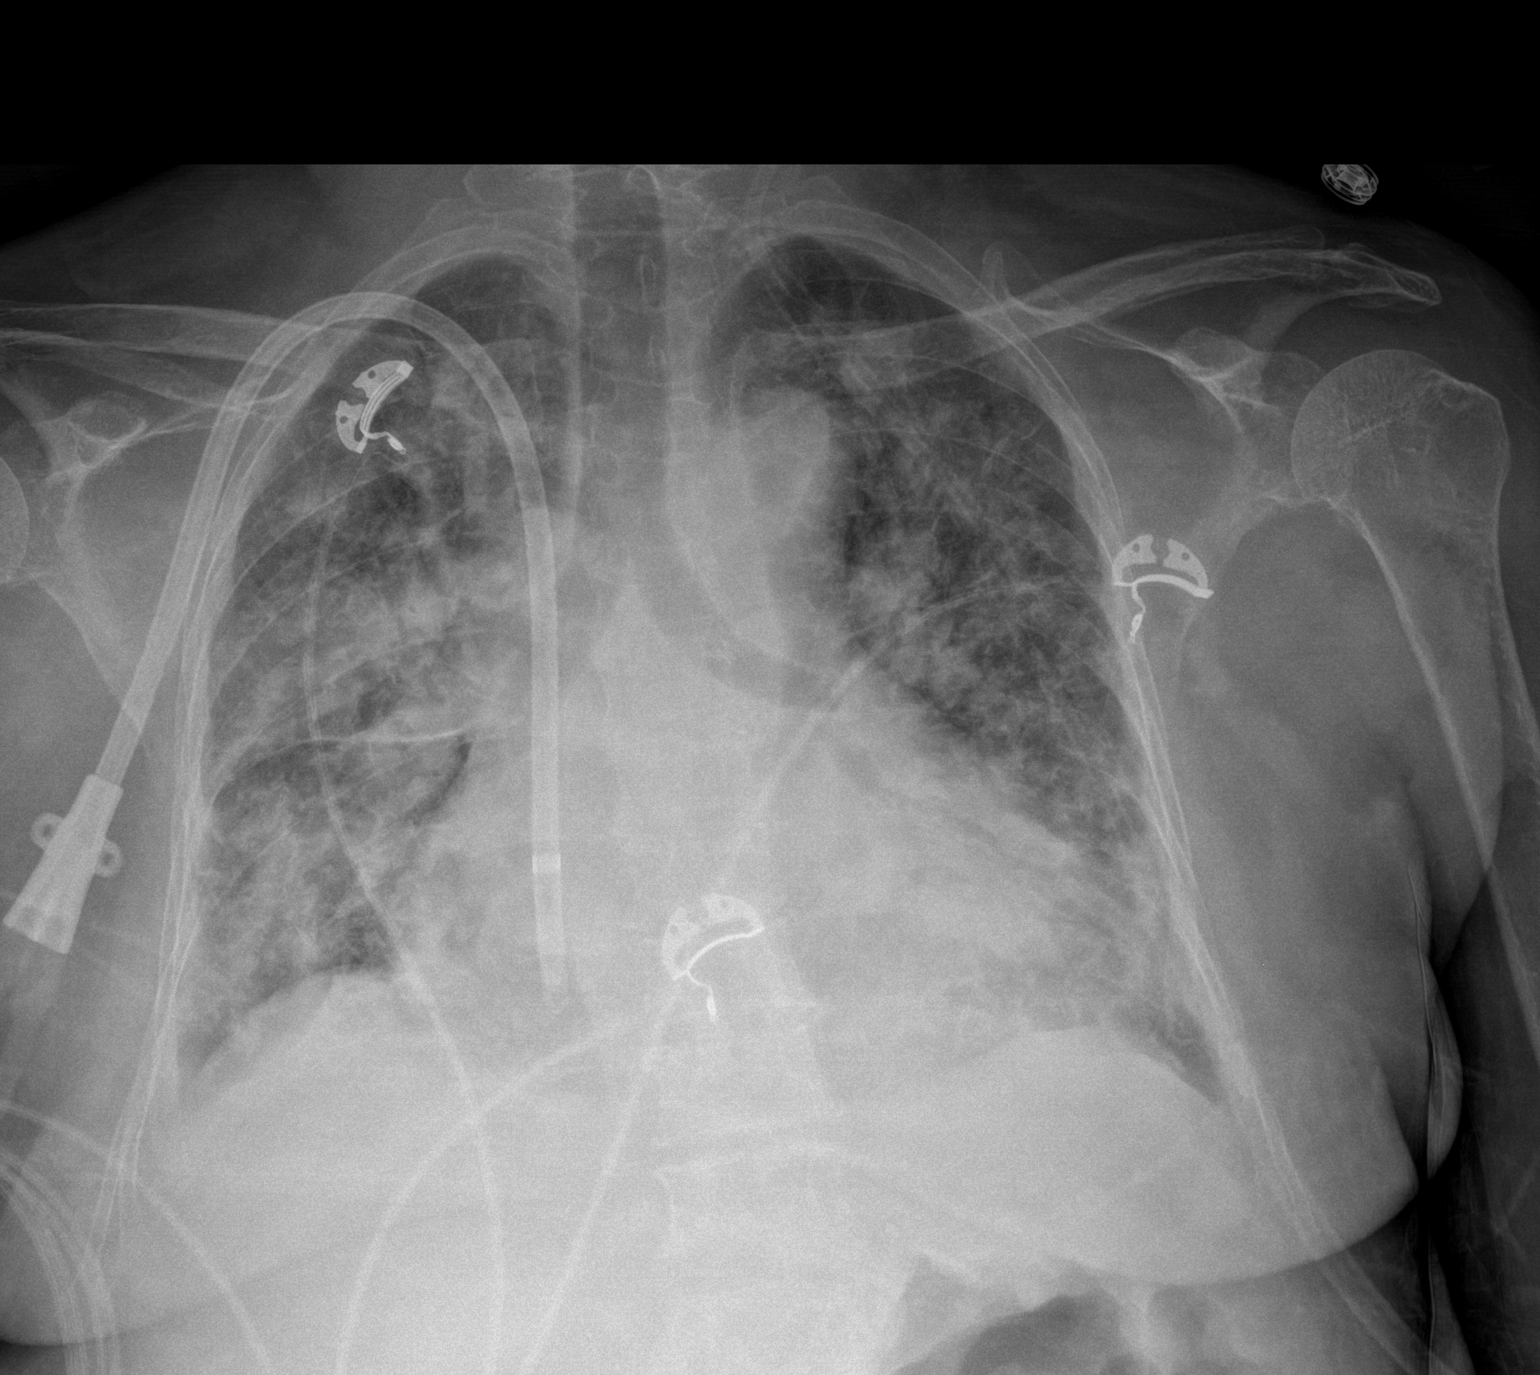

[1 of 1 positions shown; findings below may reference images not displayed]

FINDINGS: Right-sided dialysis catheter with tip over right atrium. Bilateral
confluent airspace opacities, new since the prior radiograph may
represent edema but concerning for pneumonia. Clinical correlation
recommended. No pleural effusion pneumothorax. Stable cardiomegaly.

There is no bowel dilatation or evidence of obstruction. No free air
or radiopaque calculi. Osteopenia. No acute osseous pathology.
Partially visualized right femoral fixation hardware.
IMPRESSION: 1. Bilateral confluent airspace opacities may represent edema but
concerning for pneumonia. Clinical correlation is recommended.
2. No bowel obstruction.

## 2021-10-23 IMAGING — DX DG ABDOMEN 1V
2 series · 2 of 2 positions shown · non-contrast
Comparison: Radiograph dated 08/19/2019.

CLINICAL DATA: 68-year-old female with chest and abdominal pain.

EXAM:
CHEST  1 VIEW; ABDOMEN - 1 VIEW

[abdomen supine (1 of 2)]
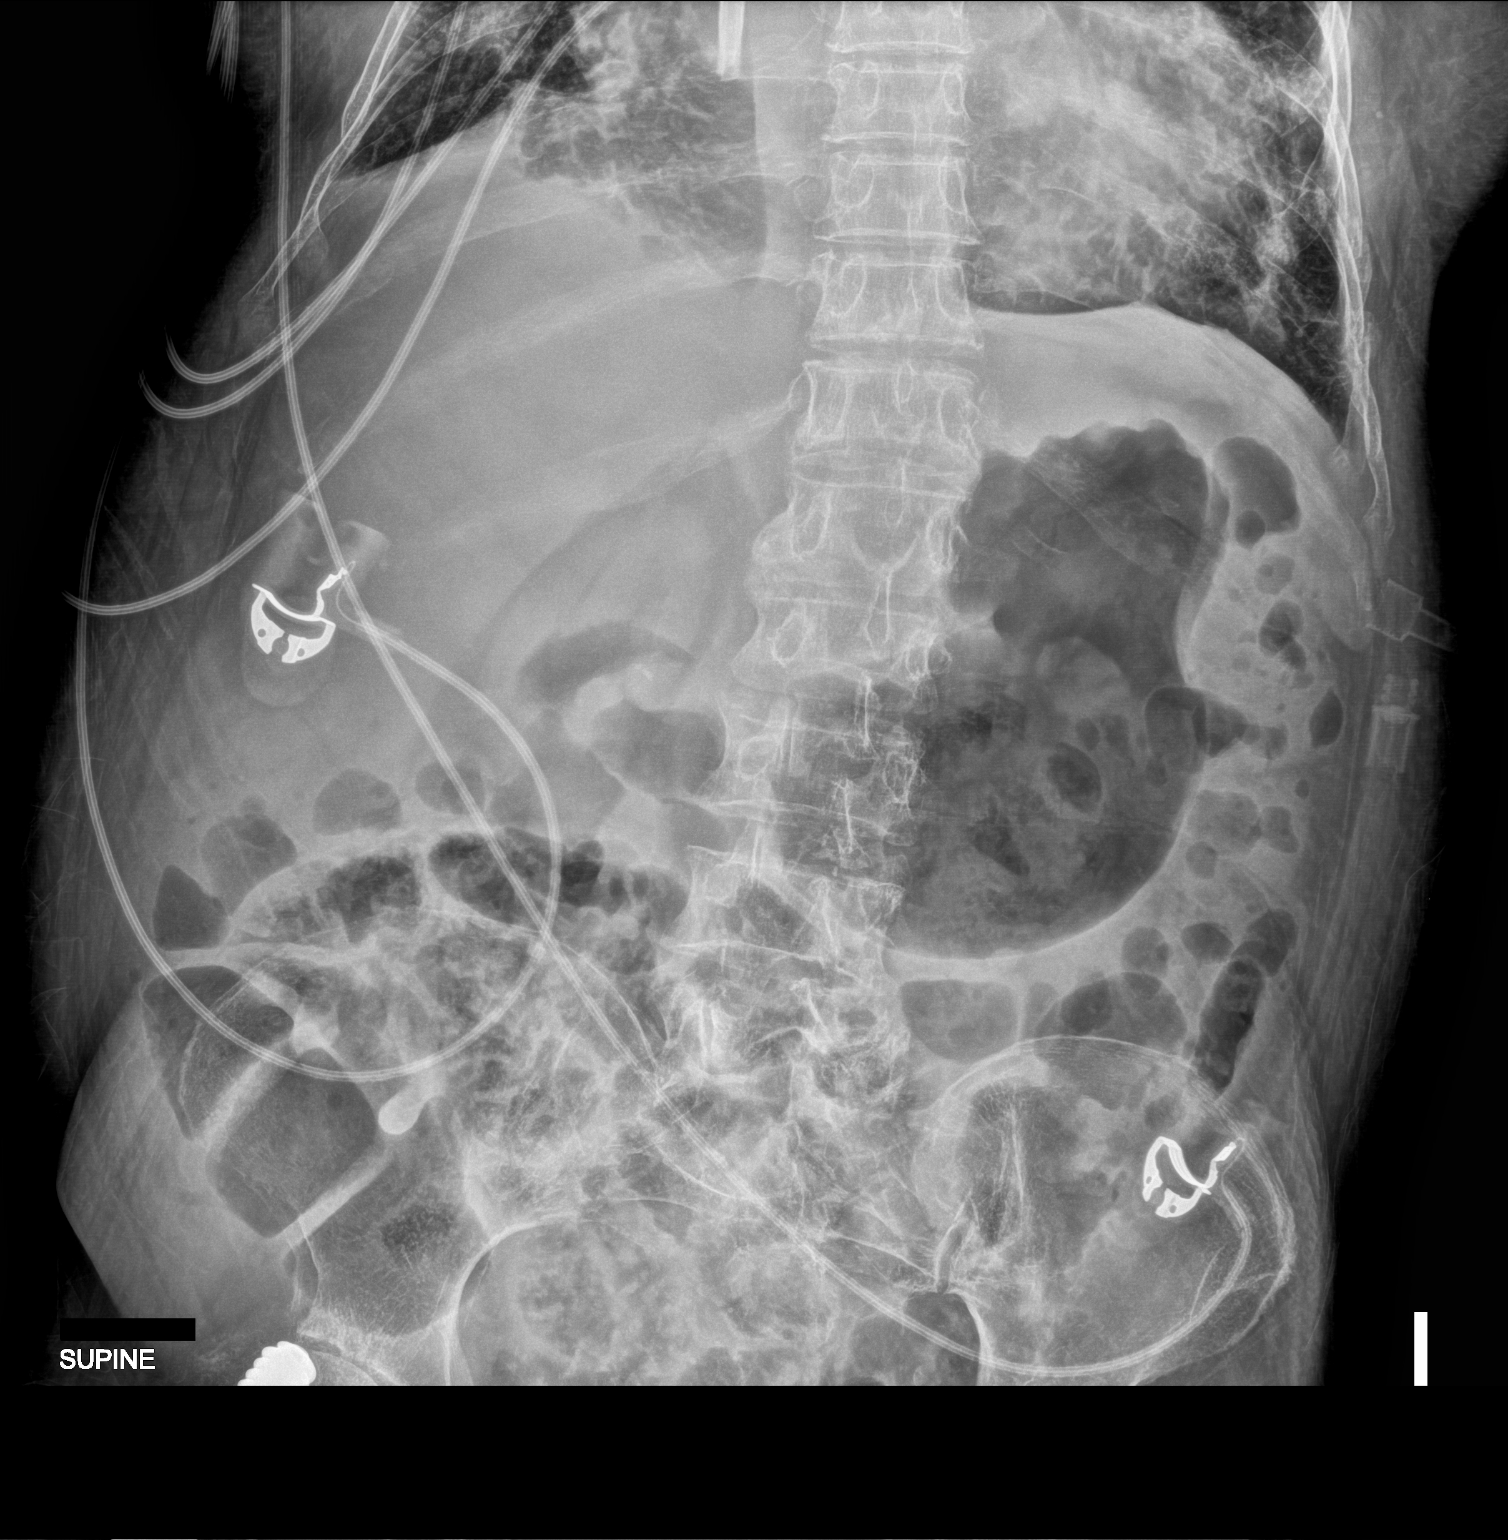

[abdomen supine (2 of 2)]
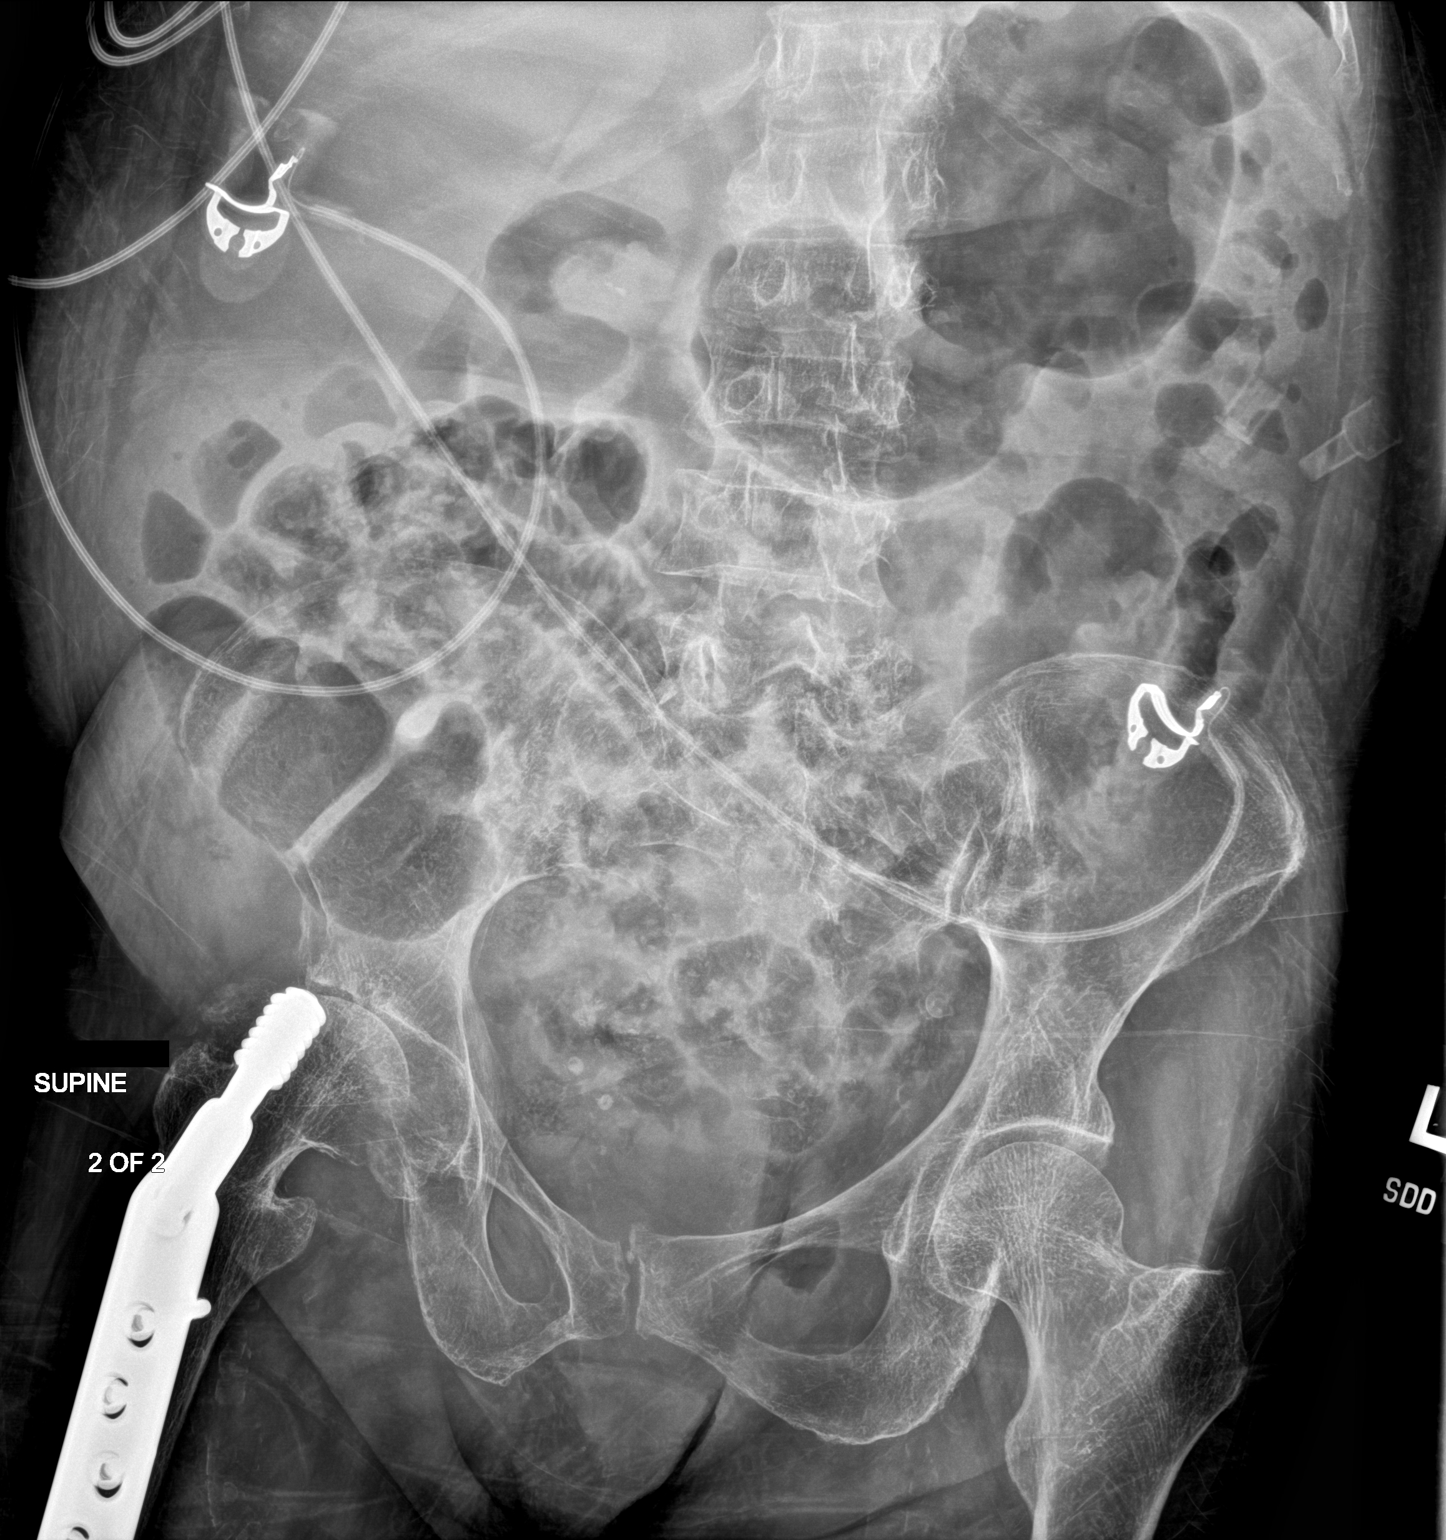

[2 of 2 positions shown; findings below may reference images not displayed]

FINDINGS: Right-sided dialysis catheter with tip over right atrium. Bilateral
confluent airspace opacities, new since the prior radiograph may
represent edema but concerning for pneumonia. Clinical correlation
recommended. No pleural effusion pneumothorax. Stable cardiomegaly.

There is no bowel dilatation or evidence of obstruction. No free air
or radiopaque calculi. Osteopenia. No acute osseous pathology.
Partially visualized right femoral fixation hardware.
IMPRESSION: 1. Bilateral confluent airspace opacities may represent edema but
concerning for pneumonia. Clinical correlation is recommended.
2. No bowel obstruction.
